# Patient Record
Sex: Female | Born: 1950
Health system: Southern US, Community
[De-identification: ages and names within clinical notes are randomized; demographics above are authoritative.]

## PROBLEM LIST (undated history)

## (undated) DIAGNOSIS — R413 Other amnesia: Secondary | ICD-10-CM

## (undated) DIAGNOSIS — N390 Urinary tract infection, site not specified: Secondary | ICD-10-CM

## (undated) DIAGNOSIS — G20A1 Parkinson's disease without dyskinesia, without mention of fluctuations: Secondary | ICD-10-CM

## (undated) DIAGNOSIS — G20 Parkinson's disease: Secondary | ICD-10-CM

## (undated) DIAGNOSIS — F0280 Dementia in other diseases classified elsewhere without behavioral disturbance: Secondary | ICD-10-CM

## (undated) DIAGNOSIS — E049 Nontoxic goiter, unspecified: Secondary | ICD-10-CM

## (undated) DIAGNOSIS — F0390 Unspecified dementia without behavioral disturbance: Secondary | ICD-10-CM

## (undated) DIAGNOSIS — D649 Anemia, unspecified: Secondary | ICD-10-CM

## (undated) DIAGNOSIS — G309 Alzheimer's disease, unspecified: Secondary | ICD-10-CM

## (undated) HISTORY — DX: Nontoxic goiter, unspecified: E04.9

---

## 2015-01-20 ENCOUNTER — Emergency Department
Admission: EM | Admit: 2015-01-20 | Discharge: 2015-01-20 | Disposition: A | Discharge status: Home / Self Care | Payer: Medicare (Managed Care) | Attending: Emergency Medicine | Admitting: Emergency Medicine

## 2015-01-20 ENCOUNTER — Encounter: Payer: Self-pay | Admitting: Emergency Medicine

## 2015-01-20 DIAGNOSIS — N390 Urinary tract infection, site not specified: Secondary | ICD-10-CM | POA: Diagnosis not present

## 2015-01-20 DIAGNOSIS — M549 Dorsalgia, unspecified: Secondary | ICD-10-CM | POA: Diagnosis present

## 2015-01-20 LAB — URINALYSIS COMPLETE WITH MICROSCOPIC (ARMC ONLY)
BILIRUBIN URINE: NEGATIVE
Bacteria, UA: NONE SEEN
GLUCOSE, UA: NEGATIVE mg/dL
KETONES UR: NEGATIVE mg/dL
Nitrite: NEGATIVE
PH: 6 (ref 5.0–8.0)
Protein, ur: NEGATIVE mg/dL
Specific Gravity, Urine: 1.012 (ref 1.005–1.030)

## 2015-01-20 MED ORDER — CEPHALEXIN 500 MG PO CAPS: 500.0000 mg | ORAL_CAPSULE | Freq: Three times a day (TID) | ORAL | Status: DC

## 2015-01-20 NOTE — ED Notes (Addendum)
Pt c/o lower back pain X 1 month and white colored vaginal discharge.  Pt was living in [place] and came down here to stay with brother. Family wants her evaluated for any medical problems.  Explained ED will evaluate for back pain and discharge but can get her a number when she goes home to get set up with PCP so that she can have full evaluation that they are asking for.

## 2015-01-20 NOTE — Discharge Instructions (Signed)
Please seek medical attention for any high fevers, chest pain, shortness of breath, change in behavior, persistent vomiting, bloody stool or any other new or concerning symptoms. ° ° °Urinary Tract Infection °Urinary tract infections (UTIs) can develop anywhere along your urinary tract. Your urinary tract is your body's drainage system for removing wastes and extra water. Your urinary tract includes two kidneys, two ureters, a bladder, and a urethra. Your kidneys are a pair of bean-shaped organs. Each kidney is about the size of your fist. They are located below your ribs, one on each side of your spine. °CAUSES °Infections are caused by microbes, which are microscopic organisms, including fungi, viruses, and bacteria. These organisms are so small that they can only be seen through a microscope. Bacteria are the microbes that most commonly cause UTIs. °SYMPTOMS  °Symptoms of UTIs may vary by age and gender of the patient and by the location of the infection. Symptoms in young women typically include a frequent and intense urge to urinate and a painful, burning feeling in the bladder or urethra during urination. Older women and men are more likely to be tired, shaky, and weak and have muscle aches and abdominal pain. A fever may mean the infection is in your kidneys. Other symptoms of a kidney infection include pain in your back or sides below the ribs, nausea, and vomiting. °DIAGNOSIS °To diagnose a UTI, your caregiver will ask you about your symptoms. Your caregiver will also ask you to provide a urine sample. The urine sample will be tested for bacteria and white blood cells. White blood cells are made by your body to help fight infection. °TREATMENT  °Typically, UTIs can be treated with medication. Because most UTIs are caused by a bacterial infection, they usually can be treated with the use of antibiotics. The choice of antibiotic and length of treatment depend on your symptoms and the type of bacteria causing  your infection. °HOME CARE INSTRUCTIONS °· If you were prescribed antibiotics, take them exactly as your caregiver instructs you. Finish the medication even if you feel better after you have only taken some of the medication. °· Drink enough water and fluids to keep your urine clear or pale yellow. °· Avoid caffeine, tea, and carbonated beverages. They tend to irritate your bladder. °· Empty your bladder often. Avoid holding urine for long periods of time. °· Empty your bladder before and after sexual intercourse. °· After a bowel movement, women should cleanse from front to back. Use each tissue only once. °SEEK MEDICAL CARE IF:  °· You have back pain. °· You develop a fever. °· Your symptoms do not begin to resolve within 3 days. °SEEK IMMEDIATE MEDICAL CARE IF:  °· You have severe back pain or lower abdominal pain. °· You develop chills. °· You have nausea or vomiting. °· You have continued burning or discomfort with urination. °MAKE SURE YOU:  °· Understand these instructions. °· Will watch your condition. °· Will get help right away if you are not doing well or get worse. °  °This information is not intended to replace advice given to you by your health care provider. Make sure you discuss any questions you have with your health care provider. °  °Document Released: 11/02/2004 Document Revised: 10/14/2014 Document Reviewed: 03/03/2011 °Elsevier Interactive Patient Education ©2016 [employer] ° °

## 2015-01-20 NOTE — ED Provider Notes (Signed)
[facility] Emergency Department Provider Note   ____________________________________________  Time seen: [id number]  I have reviewed the triage vital signs and the nursing notes.   HISTORY  Chief Complaint Back Pain and Vaginal Discharge   History limited by: Not Limited   HPI Jamie Boyd is a 64 y.o. female who presents to the emergency department today accompanied by family because of concerns for dysuria. The patient states for the last week and a half she has had burning with urination. She states this is not every time she peas but most of the times. In addition she has noticed a bad odor to her urine. She has not noticed any discoloration. She denies any abdominal or flank pain. Patient just moved into the area and family did have questions about getting her a referral for primary care. Patient has not had any fevers.   History reviewed. No pertinent past medical history.  There are no active problems to display for this patient.   History reviewed. No pertinent past surgical history.  No current outpatient prescriptions on file.  Allergies Review of patient's allergies indicates not on file.  History reviewed. No pertinent family history.  Social History Social History  Substance Use Topics  . Smoking status: Never Smoker   . Smokeless tobacco: None  . Alcohol Use: No    Review of Systems  Constitutional: Negative for fever. Cardiovascular: Negative for chest pain. Respiratory: Negative for shortness of breath. Gastrointestinal: Negative for abdominal pain, vomiting and diarrhea. Genitourinary: Positive for dysuria. Musculoskeletal: Negative for back pain. Skin: Negative for rash. Neurological: Negative for headaches, focal weakness or numbness.   10-point ROS otherwise negative.  ____________________________________________   PHYSICAL EXAM:  VITAL SIGNS: ED Triage Vitals  Enc Vitals Group     BP 01/20/15 1313 137/74 mmHg      Pulse Rate 01/20/15 1313 73     Resp 01/20/15 1313 16     Temp 01/20/15 1313 98.7 F (37.1 C)     Temp Source 01/20/15 1313 Oral     SpO2 01/20/15 1313 100 %     Weight 01/20/15 1316 131 lb 3.2 oz (59.512 kg)     Height 01/20/15 1311 5\' 3"  (1.6 m)     Head Cir --      Peak Flow --      Pain Score 01/20/15 1312 6   Constitutional: Alert and oriented. Well appearing and in no distress. Eyes: Conjunctivae are normal. PERRL. Normal extraocular movements. ENT   Head: Normocephalic and atraumatic.   Nose: No congestion/rhinnorhea.   Mouth/Throat: Mucous membranes are moist.   Neck: No stridor. Hematological/Lymphatic/Immunilogical: No cervical lymphadenopathy. Cardiovascular: Normal rate, regular rhythm.  No murmurs, rubs, or gallops. Respiratory: Normal respiratory effort without tachypnea nor retractions. Breath sounds are clear and equal bilaterally. No wheezes/rales/rhonchi. Gastrointestinal: Soft and nontender. No distention. There is no CVA tenderness. Genitourinary: Deferred Musculoskeletal: Normal range of motion in all extremities. No joint effusions.  No lower extremity tenderness nor edema. Neurologic:  Normal speech and language. No gross focal neurologic deficits are appreciated.  Skin:  Skin is warm, dry and intact. No rash noted. Psychiatric: Mood and affect are normal. Speech and behavior are normal. Patient exhibits appropriate insight and judgment.  ____________________________________________    LABS (pertinent positives/negatives)  Labs Reviewed  URINALYSIS COMPLETEWITH MICROSCOPIC ([facility] ONLY) - Abnormal; Notable for the following:    Color, Urine YELLOW (*)    APPearance HAZY (*)    Hgb urine dipstick 1+ (*)  Leukocytes, UA 2+ (*)    Squamous Epithelial / LPF 0-5 (*)    All other components within normal limits     ____________________________________________   EKG  None  ____________________________________________     RADIOLOGY  None  ____________________________________________   PROCEDURES  Procedure(s) performed: None  Critical Care performed: No  ____________________________________________   INITIAL IMPRESSION / ASSESSMENT AND PLAN / ED COURSE  Pertinent labs & imaging results that were available during my care of the patient were reviewed by me and considered in my medical decision making (see chart for details).  Patient presented to the emergency department today because of concerns for dysuria. Urine was tested and is concerning for urinary tract infection. Patient has no allergies to antibiotics. This point no signs of pyelonephritis or sepsis. Will give oral antibiotics and referral for primary care.  ____________________________________________   FINAL CLINICAL IMPRESSION(S) / ED DIAGNOSES  Final diagnoses:  UTI (lower urinary tract infection)     Nance Pear, MD 01/20/15 1552

## 2015-01-27 ENCOUNTER — Emergency Department: Payer: Medicare (Managed Care)

## 2015-01-27 ENCOUNTER — Encounter: Payer: Self-pay | Admitting: Emergency Medicine

## 2015-01-27 ENCOUNTER — Other Ambulatory Visit: Payer: Self-pay

## 2015-01-27 ENCOUNTER — Emergency Department
Admission: EM | Admit: 2015-01-27 | Discharge: 2015-01-27 | Disposition: A | Discharge status: Home / Self Care | Payer: Medicare (Managed Care) | Attending: Student | Admitting: Student

## 2015-01-27 DIAGNOSIS — R1031 Right lower quadrant pain: Secondary | ICD-10-CM | POA: Diagnosis present

## 2015-01-27 DIAGNOSIS — Z792 Long term (current) use of antibiotics: Secondary | ICD-10-CM | POA: Diagnosis not present

## 2015-01-27 DIAGNOSIS — N390 Urinary tract infection, site not specified: Secondary | ICD-10-CM

## 2015-01-27 DIAGNOSIS — I10 Essential (primary) hypertension: Secondary | ICD-10-CM | POA: Insufficient documentation

## 2015-01-27 HISTORY — DX: Unspecified dementia, unspecified severity, without behavioral disturbance, psychotic disturbance, mood disturbance, and anxiety: F03.90

## 2015-01-27 LAB — URINALYSIS COMPLETE WITH MICROSCOPIC (ARMC ONLY)
Bilirubin Urine: NEGATIVE
GLUCOSE, UA: NEGATIVE mg/dL
HGB URINE DIPSTICK: NEGATIVE
Ketones, ur: NEGATIVE mg/dL
Nitrite: NEGATIVE
PROTEIN: NEGATIVE mg/dL
SPECIFIC GRAVITY, URINE: 1.006 (ref 1.005–1.030)
pH: 7 (ref 5.0–8.0)

## 2015-01-27 LAB — COMPREHENSIVE METABOLIC PANEL
ALBUMIN: 3.7 g/dL (ref 3.5–5.0)
ALK PHOS: 77 U/L (ref 38–126)
ALT: 18 U/L (ref 14–54)
AST: 27 U/L (ref 15–41)
Anion gap: 8 (ref 5–15)
BUN: 16 mg/dL (ref 6–20)
CALCIUM: 8.9 mg/dL (ref 8.9–10.3)
CHLORIDE: 105 mmol/L (ref 101–111)
CO2: 24 mmol/L (ref 22–32)
CREATININE: 0.71 mg/dL (ref 0.44–1.00)
GFR calc Af Amer: >60 mL/min (ref >60)
GFR calc non Af Amer: >60 mL/min (ref >60)
GLUCOSE: 134 mg/dL — AB (ref 65–99)
Potassium: 4 mmol/L (ref 3.5–5.1)
SODIUM: 137 mmol/L (ref 135–145)
Total Bilirubin: 0.4 mg/dL (ref 0.3–1.2)
Total Protein: 7.4 g/dL (ref 6.5–8.1)

## 2015-01-27 LAB — TROPONIN I

## 2015-01-27 LAB — CBC WITH DIFFERENTIAL/PLATELET
BASOS ABS: 0.1 10*3/uL (ref 0–0.1)
BASOS PCT: 2 %
EOS ABS: 0.8 10*3/uL — AB (ref 0–0.7)
Eosinophils Relative: 9 %
HCT: 30 % — ABNORMAL LOW (ref 35.0–47.0)
HEMOGLOBIN: 9.7 g/dL — AB (ref 12.0–16.0)
LYMPHS ABS: 2.1 10*3/uL (ref 1.0–3.6)
Lymphocytes Relative: 23 %
MCH: 28.2 pg (ref 26.0–34.0)
MCHC: 32.2 g/dL (ref 32.0–36.0)
MCV: 87.5 fL (ref 80.0–100.0)
Monocytes Absolute: 0.8 10*3/uL (ref 0.2–0.9)
Monocytes Relative: 10 %
NEUTROS PCT: 56 %
Neutro Abs: 5 10*3/uL (ref 1.4–6.5)
PLATELETS: 317 10*3/uL (ref 150–440)
RBC: 3.43 MIL/uL — AB (ref 3.80–5.20)
RDW: 15.2 % — ABNORMAL HIGH (ref 11.5–14.5)
WBC: 8.9 10*3/uL (ref 3.6–11.0)

## 2015-01-27 LAB — LIPASE, BLOOD: Lipase: 64 U/L — ABNORMAL HIGH (ref 11–51)

## 2015-01-27 MED ORDER — ONDANSETRON HCL 4 MG/2ML IJ SOLN
4.0000 mg | Freq: Once | INTRAMUSCULAR | Status: AC
  Administered 2015-01-27: 4 mg via INTRAVENOUS
  Filled 2015-01-27: qty 2

## 2015-01-27 MED ORDER — SODIUM CHLORIDE 0.9 % IV BOLUS (SEPSIS)
500.0000 mL | Freq: Once | INTRAVENOUS | Status: AC
  Administered 2015-01-27: 500 mL via INTRAVENOUS

## 2015-01-27 MED ORDER — IOHEXOL 240 MG/ML SOLN
25.0000 mL | INTRAMUSCULAR | Status: AC
  Administered 2015-01-27: 25 mL via ORAL
  Administered 2015-01-27: 50 mL via ORAL

## 2015-01-27 MED ORDER — IOHEXOL 300 MG/ML  SOLN
85.0000 mL | Freq: Once | INTRAMUSCULAR | Status: AC | PRN
  Administered 2015-01-27: 85 mL via INTRAVENOUS

## 2015-01-27 NOTE — ED Notes (Signed)
Pt states RLQ abd pain for a few days, denies any nausea or vomiting or weakness, states BM yesterday, pt awake and alert

## 2015-01-27 NOTE — ED Notes (Signed)
Pt brought to ED by her son who was concerned with her new onset of R lower abdominal pain that started yesterday.  Denies nausea/vomiting/diarrhea.

## 2015-01-27 NOTE — ED Notes (Signed)
Patient transported to CT 

## 2015-01-27 NOTE — ED Provider Notes (Signed)
[facility] Emergency Department Provider Note  ____________________________________________  Time seen: Approximately 7:16 AM  I have reviewed the triage vital signs and the nursing notes.   HISTORY  Chief Complaint Abdominal Pain  Caveat-history of present illness and review of systems is limited due to the patient's dementia. Information is obtained from her son at bedside.  HPI Dezyrae Luebbert is a 64 y.o. female with history of hypertension, mild dementia who presents for evaluation of sudden onset right lower quadrant pain this morning, maximal at onset, now mild, no modifying factors, constant since onset. The son reports that the patient awoke from sleep this morning and was grabbing her right lower abdomen and crying in pain. This seems to have improved and she has appeared much more comfortable but this concerned him. She has had no vomiting, diarrhea, fevers or chills. She denies chest pain or difficulty breathing. She denies painful or burning with urination.   Past Medical History  Diagnosis Date  . Dementia     There are no active problems to display for this patient.   History reviewed. No pertinent past surgical history.  Current Outpatient Rx  Name  Route  Sig  Dispense  Refill  . cephALEXin (KEFLEX) 500 MG capsule   Oral   Take 1 capsule (500 mg total) by mouth 3 (three) times daily.   30 capsule   0     Allergies Review of patient's allergies indicates no known allergies.  History reviewed. No pertinent family history.  Social History Social History  Substance Use Topics  . Smoking status: Never Smoker   . Smokeless tobacco: None  . Alcohol Use: No    Review of Systems Constitutional: No fever/chills Eyes: No visual changes. ENT: No sore throat. Cardiovascular: Denies chest pain. Respiratory: Denies shortness of breath. Gastrointestinal: + abdominal pain.  No nausea, no vomiting.  No diarrhea.  No  constipation. Genitourinary: Negative for dysuria. Musculoskeletal: Negative for back pain. Skin: Negative for rash. Neurological: Negative for headaches, focal weakness or numbness.  10-point ROS otherwise negative.  ____________________________________________   PHYSICAL EXAM:  VITAL SIGNS: ED Triage Vitals  Enc Vitals Group     BP 01/27/15 0708 123/70 mmHg     Pulse Rate 01/27/15 0708 70     Resp --      Temp 01/27/15 0708 98.4 F (36.9 C)     Temp Source 01/27/15 0708 Oral     SpO2 01/27/15 0708 100 %     Weight 01/27/15 0708 110 lb (49.896 kg)     Height 01/27/15 0708 5\' 3"  (1.6 m)     Head Cir --      Peak Flow --      Pain Score 01/27/15 0709 9     Pain Loc --      Pain Edu? --      Excl. in [place]? --     Constitutional: Alert and oriented to self and place but not to year. Well appearing and in no acute distress. Eyes: Conjunctivae are normal. PERRL. EOMI. Head: Atraumatic. Nose: No congestion/rhinnorhea. Mouth/Throat: Mucous membranes are moist.  Oropharynx non-erythematous. Neck: No stridor.   Cardiovascular: Normal rate, regular rhythm. Grossly normal heart sounds.  Good peripheral circulation. Respiratory: Normal respiratory effort.  No retractions. Lungs CTAB. Gastrointestinal: Soft with faint right lower quadrant tenderness. Normal bowel sounds. No distention. No CVA tenderness. Genitourinary: deferred Rectal: brown stool in rectal vault is guaiac negative Musculoskeletal: No lower extremity tenderness nor edema.  No joint  effusions. Neurologic:  Normal speech and language. No gross focal neurologic deficits are appreciated. No gait instability. Skin:  Skin is warm, dry and intact. No rash noted. Psychiatric: Mood and affect are normal. Speech and behavior are normal.  ____________________________________________   LABS (all labs ordered are listed, but only abnormal results are displayed)  Labs Reviewed  CBC WITH DIFFERENTIAL/PLATELET - Abnormal;  Notable for the following:    RBC 3.43 (*)    Hemoglobin 9.7 (*)    HCT 30.0 (*)    RDW 15.2 (*)    Eosinophils Absolute 0.8 (*)    All other components within normal limits  COMPREHENSIVE METABOLIC PANEL - Abnormal; Notable for the following:    Glucose, Bld 134 (*)    All other components within normal limits  LIPASE, BLOOD - Abnormal; Notable for the following:    Lipase 64 (*)    All other components within normal limits  URINALYSIS COMPLETEWITH MICROSCOPIC ([facility] ONLY) - Abnormal; Notable for the following:    Color, Urine STRAW (*)    APPearance CLEAR (*)    Leukocytes, UA 3+ (*)    Bacteria, UA RARE (*)    Squamous Epithelial / LPF 0-5 (*)    All other components within normal limits  URINE CULTURE  TROPONIN I   ____________________________________________  EKG  ED ECG REPORT I, Joanne Gavel, the attending physician, personally viewed and interpreted this ECG.   Date: 01/27/2015  EKG Time: 07:19  Rate: 71  Rhythm: normal sinus rhythm  Axis: normal  Intervals:none  ST&T Change: No acute ST elevation. No acute ST elevation. Minimal ST depression in aVF, V4, V5, V6.  ____________________________________________  RADIOLOGY  CT abdomen and pelvis IMPRESSION: Appendix appears normal. Soft tissue material throughout the proximal at ascending colon. Likely stool. A mass could easily be obscured in this circumstance. This finding may well warrant direct visualization by colonoscopy to further assess. There is no mesenteric thickening in this area or elsewhere in the abdomen or pelvis.  Mild wall thickening in the proximal duodenum, likely a degree of duodenitis. No well-defined ulceration identified in this area. No fistula.  8 x 6 mm calculus lower pole left kidney, nonobstructing. No hydronephrosis or ureteral calculus on either side.  Moderate distention of the rectum by air.  Uterus absent.  Disc extrusion far laterally on the right at L4-5.  Multilevel osteoarthritic change in the lumbar spine.  Small ventral hernia containing only fat.  ____________________________________________   PROCEDURES  Procedure(s) performed: None  Critical Care performed: No  ____________________________________________   INITIAL IMPRESSION / ASSESSMENT AND PLAN / ED COURSE  Pertinent labs & imaging results that were available during my care of the patient were reviewed by me and considered in my medical decision making (see chart for details).  Myasiah Sjoblom is a 64 y.o. female with history of hypertension, mild dementia who presents for evaluation of sudden onset right lower quadrant pain this morning, maximal at onset, now mild, no modifying factors. On exam, she is well-appearing and in no acute distress. Vital signs stable, she is afebrile. We'll obtain labs, urinalysis, likely CT of the abdomen and pelvis to further evaluate the cause of her pain.   ----------------------------------------- 11:31 AM on 01/27/2015 ----------------------------------------- Patient continues to appear well with complete resolution of her pain at this time. She is tolerating by mouth intake. Labs reviewed. Hemoglobin 9.7 but baseline unknown. Normal rectal exam was guaiac-negative stool. CMP unremarkable. Troponin negative. Lipase is elevated at 64. CT of  the abdomen and pelvis shows no acute pancreatitis or any other acute pathology to explain the patient's pain. Urinalysis concerning for urinary tract infection however the patient is currently being treated with Keflex for urinary tract infection. I have advised her to continue Keflex, urine cultures are pending. We discussed return precautions, need for close PCP follow-up with referral to GI for colonoscopy given CT results today. The patient her family at bedside are comfortable with the discharge plan.  ____________________________________________   FINAL CLINICAL IMPRESSION(S) / ED  DIAGNOSES  Final diagnoses:  RLQ abdominal pain  UTI (lower urinary tract infection)      Joanne Gavel, MD 01/27/15 1544

## 2015-01-29 DIAGNOSIS — N390 Urinary tract infection, site not specified: Secondary | ICD-10-CM | POA: Insufficient documentation

## 2015-01-29 DIAGNOSIS — D649 Anemia, unspecified: Secondary | ICD-10-CM | POA: Insufficient documentation

## 2015-01-29 DIAGNOSIS — R413 Other amnesia: Secondary | ICD-10-CM | POA: Insufficient documentation

## 2015-01-29 LAB — URINE CULTURE

## 2015-02-17 ENCOUNTER — Encounter: Payer: Self-pay | Admitting: *Deleted

## 2015-02-18 ENCOUNTER — Ambulatory Visit: Payer: Medicare HMO | Admitting: Anesthesiology

## 2015-02-18 ENCOUNTER — Encounter: Payer: Self-pay | Admitting: *Deleted

## 2015-02-18 ENCOUNTER — Ambulatory Visit
Admission: RE | Admit: 2015-02-18 | Discharge: 2015-02-18 | Disposition: A | Discharge status: Home / Self Care | Payer: Medicare HMO | Source: Ambulatory Visit | Attending: Gastroenterology | Admitting: Gastroenterology

## 2015-02-18 ENCOUNTER — Encounter
Admission: RE | Disposition: A | Discharge status: Home / Self Care | Payer: Self-pay | Source: Ambulatory Visit | Attending: Gastroenterology

## 2015-02-18 DIAGNOSIS — F0390 Unspecified dementia without behavioral disturbance: Secondary | ICD-10-CM | POA: Diagnosis not present

## 2015-02-18 DIAGNOSIS — R1031 Right lower quadrant pain: Secondary | ICD-10-CM | POA: Diagnosis not present

## 2015-02-18 DIAGNOSIS — K5520 Angiodysplasia of colon without hemorrhage: Secondary | ICD-10-CM | POA: Insufficient documentation

## 2015-02-18 DIAGNOSIS — Z79899 Other long term (current) drug therapy: Secondary | ICD-10-CM | POA: Diagnosis not present

## 2015-02-18 DIAGNOSIS — D122 Benign neoplasm of ascending colon: Secondary | ICD-10-CM | POA: Insufficient documentation

## 2015-02-18 DIAGNOSIS — D649 Anemia, unspecified: Secondary | ICD-10-CM | POA: Insufficient documentation

## 2015-02-18 DIAGNOSIS — D125 Benign neoplasm of sigmoid colon: Secondary | ICD-10-CM | POA: Insufficient documentation

## 2015-02-18 HISTORY — DX: Other amnesia: R41.3

## 2015-02-18 HISTORY — PX: ESOPHAGOGASTRODUODENOSCOPY: SHX5428

## 2015-02-18 HISTORY — DX: Anemia, unspecified: D64.9

## 2015-02-18 HISTORY — PX: COLONOSCOPY WITH PROPOFOL: SHX5780

## 2015-02-18 SURGERY — COLONOSCOPY WITH PROPOFOL
Anesthesia: General

## 2015-02-18 MED ORDER — PROPOFOL 500 MG/50ML IV EMUL
INTRAVENOUS | Status: DC | PRN
Start: 2015-02-18 — End: 2015-02-18
  Administered 2015-02-18: 120 ug/kg/min via INTRAVENOUS

## 2015-02-18 MED ORDER — SODIUM CHLORIDE 0.9 % IV SOLN
INTRAVENOUS | Status: DC
  Administered 2015-02-18: 10:00:00 via INTRAVENOUS

## 2015-02-18 MED ORDER — LIDOCAINE HCL (CARDIAC) 20 MG/ML IV SOLN
INTRAVENOUS | Status: DC | PRN
  Administered 2015-02-18: 60 mg via INTRAVENOUS

## 2015-02-18 MED ORDER — PROPOFOL 10 MG/ML IV BOLUS
INTRAVENOUS | Status: DC | PRN
  Administered 2015-02-18: 30 mg via INTRAVENOUS

## 2015-02-18 MED ORDER — PHENYLEPHRINE HCL 10 MG/ML IJ SOLN
INTRAMUSCULAR | Status: DC | PRN
  Administered 2015-02-18: 100 ug via INTRAVENOUS

## 2015-02-18 MED ORDER — GLYCOPYRROLATE 0.2 MG/ML IJ SOLN
INTRAMUSCULAR | Status: DC | PRN
  Administered 2015-02-18: 0.2 mg via INTRAVENOUS

## 2015-02-18 MED ORDER — SODIUM CHLORIDE 0.9 % IV SOLN: INTRAVENOUS | Status: DC

## 2015-02-18 NOTE — Op Note (Signed)
[facility] Patient Name: Jamie Boyd Procedure Date: 02/18/2015 10:31 AM MRN: [id number] Account #: [id number] Date of Birth: 06-Sep-1950 Admit Type: Outpatient Age: 65 Room: [facility] ENDO ROOM 4 Gender: Female Note Status: Finalized Procedure:         Colonoscopy Indications:       Anemia, Abnormal CT of the GI tract Providers:         Lupita Dawn. Candace Cruise, MD Referring MD:      Forest Gleason Md, MD (Referring MD) Medicines:         Monitored Anesthesia Care Complications:     No immediate complications. Procedure:         Pre-Anesthesia Assessment:                    - Prior to the procedure, a History and Physical was                     performed, and patient medications, allergies and                     sensitivities were reviewed. The patient's tolerance of                     previous anesthesia was reviewed.                    - The risks and benefits of the procedure and the sedation                     options and risks were discussed with the patient. All                     questions were answered and informed consent was obtained.                    - After reviewing the risks and benefits, the patient was                     deemed in satisfactory condition to undergo the procedure.                    After obtaining informed consent, the colonoscope was                     passed under direct vision. Throughout the procedure, the                     patient's blood pressure, pulse, and oxygen saturations                     were monitored continuously. The Colonoscope was                     introduced through the anus and advanced to the the cecum,                     identified by appendiceal orifice and ileocecal valve. The                     colonoscopy was performed without difficulty. The patient                     tolerated the procedure well. The quality of the bowel  preparation was good. Findings:      A diminutive  polyp was found in the ascending colon. The polyp was       sessile. The polyp was removed with a jumbo cold forceps. Resection and       retrieval were complete.      A small polyp was found in the sigmoid colon. The polyp was sessile. The       polyp was removed with a cold snare. Resection and retrieval were       complete.      A single small angiodysplastic lesion without bleeding was found in the       cecum. Coagulation for bleeding prevention using heater probe was       successful.      A single medium-sized angiodysplastic lesion was found in the cecum.       Coagulation for bleeding prevention using heater probe was unsuccessful.       Active bleding occurred after ill heater probe cauterization.       Coagulation for hemostasis using argon plasma at 0.5 liters/minute and       30 watts was stounsuccessful. To prevent more bleeding post-maneuver,       three hemostatic clips were successfully placed. There was no bleeding       at the end of the procedure.      The exam was otherwise without abnormality. Impression:        - One diminutive polyp in the ascending colon. Resected                     and retrieved.                    - One small polyp in the sigmoid colon. Resected and                     retrieved.                    - A single non-bleeding colonic angiodysplastic lesion.                     Treated with a heater probe.                    - A single colonic angiodysplastic lesion. Treatment not                     successful. Treated with a heater probe. Treated with                     argon plasma coagulation (APC). Clips were placed.                    - The examination was otherwise normal. Recommendation:    - Discharge patient to home.                    - Await pathology results.                    - Repeat colonoscopy in 5 years for surveillance based on                     pathology results.                    - The findings and recommendations were  discussed with the                     patient. Procedure Code(s): --- Professional ---                    [phone], 22, Colonoscopy, flexible; with control of                     bleeding, any method                    45385, Colonoscopy, flexible; with removal of tumor(s),                     polyp(s), or other lesion(s) by snare technique                    45380, 67, Colonoscopy, flexible; with biopsy, single or                     multiple Diagnosis Code(s): --- Professional ---                    D12.2, Benign neoplasm of ascending colon                    D12.5, Benign neoplasm of sigmoid colon                    K55.20, Angiodysplasia of colon without hemorrhage                    D64.9, Anemia, unspecified                    R93.3, Abnormal findings on diagnostic imaging of other                     parts of digestive tract CPT copyright 2014 [employer]. All rights reserved. The codes documented in this report are preliminary and upon coder review may  be revised to meet current compliance requirements. Hulen Luster, MD 02/18/2015 11:17:49 AM This report has been signed electronically. Number of Addenda: 0 Note Initiated On: 02/18/2015 10:31 AM Scope Withdrawal Time: 0 hours 19 minutes 18 seconds  Total Procedure Duration: 0 hours 23 minutes 13 seconds       [facility]

## 2015-02-18 NOTE — Op Note (Signed)
[facility] Gastroenterology Patient Name: Jamie Boyd Procedure Date: 02/18/2015 10:33 AM MRN: [id number] Account #: [id number] Date of Birth: April 04, 1950 Admit Type: Outpatient Age: 65 Room: [facility] ENDO ROOM 4 Gender: Female Note Status: Finalized Procedure:         Upper GI endoscopy Indications:       Anemia, Abnormal CT of the GI tract Providers:         Lupita Dawn. Candace Cruise, MD Referring MD:      Forest Gleason Md, MD (Referring MD) Medicines:         Monitored Anesthesia Care Complications:     No immediate complications. Procedure:         Pre-Anesthesia Assessment:                    - Prior to the procedure, a History and Physical was                     performed, and patient medications, allergies and                     sensitivities were reviewed. The patient's tolerance of                     previous anesthesia was reviewed.                    - The risks and benefits of the procedure and the sedation                     options and risks were discussed with the patient. All                     questions were answered and informed consent was obtained.                    - After reviewing the risks and benefits, the patient was                     deemed in satisfactory condition to undergo the procedure.                    After obtaining informed consent, the endoscope was passed                     under direct vision. Throughout the procedure, the                     patient's blood pressure, pulse, and oxygen saturations                     were monitored continuously. The Endoscope was introduced                     through the mouth, and advanced to the second part of                     duodenum. The upper GI endoscopy was accomplished without                     difficulty. The patient tolerated the procedure well. Findings:      The examined esophagus was normal.      The entire examined stomach was normal.      The examined duodenum was  normal. Impression:        -  Normal esophagus.                    - Normal stomach.                    - Normal examined duodenum.                    - No specimens collected. Recommendation:    - Discharge patient to home.                    - Observe patient's clinical course.                    - The findings and recommendations were discussed with the                     patient.                    - Continue present medications. Procedure Code(s): --- Professional ---                    [phone], Esophagogastroduodenoscopy, flexible, transoral;                     diagnostic, including collection of specimen(s) by                     brushing or washing, when performed (separate procedure) Diagnosis Code(s): --- Professional ---                    D64.9, Anemia, unspecified                    R93.3, Abnormal findings on diagnostic imaging of other                     parts of digestive tract CPT copyright 2014 American Medical Association. All rights reserved. The codes documented in this report are preliminary and upon coder review may  be revised to meet current compliance requirements. Hulen Luster, MD 02/18/2015 10:47:16 AM This report has been signed electronically. Number of Addenda: 0 Note Initiated On: 02/18/2015 10:33 AM      [facility]

## 2015-02-18 NOTE — Anesthesia Preprocedure Evaluation (Signed)
Anesthesia Evaluation  Patient identified by MRN, date of birth, ID band Patient awake    Reviewed: Allergy & Precautions, H&P , NPO status , Patient's Chart, lab work & pertinent test results, reviewed documented beta blocker date and time   Airway Mallampati: II  TM Distance: >3 FB Neck ROM: full    Dental no notable dental hx. (+) Teeth Intact   Pulmonary neg pulmonary ROS,    Pulmonary exam normal breath sounds clear to auscultation       Cardiovascular Exercise Tolerance: Good negative cardio ROS   Rhythm:regular Rate:Normal     Neuro/Psych PSYCHIATRIC DISORDERS dementia negative neurological ROS  negative psych ROS   GI/Hepatic negative GI ROS, Neg liver ROS,   Endo/Other  negative endocrine ROS  Renal/GU negative Renal ROS  negative genitourinary   Musculoskeletal   Abdominal   Peds  Hematology negative hematology ROS (+) anemia ,   Anesthesia Other Findings   Reproductive/Obstetrics negative OB ROS                             Anesthesia Physical Anesthesia Plan  ASA: III  Anesthesia Plan: General   Post-op Pain Management:    Induction:   Airway Management Planned:   Additional Equipment:   Intra-op Plan:   Post-operative Plan:   Informed Consent: I have reviewed the patients History and Physical, chart, labs and discussed the procedure including the risks, benefits and alternatives for the proposed anesthesia with the patient or authorized representative who has indicated his/her understanding and acceptance.   Dental Advisory Given  Plan Discussed with: CRNA  Anesthesia Plan Comments:         Anesthesia Quick Evaluation

## 2015-02-18 NOTE — H&P (Signed)
  Date of Initial H&P: 02/09/2015  History reviewed, patient examined, no change in status, stable for surgery.

## 2015-02-18 NOTE — Transfer of Care (Signed)
Immediate Anesthesia Transfer of Care Note  Patient: Jamie Boyd  Procedure(s) Performed: Procedure(s): COLONOSCOPY WITH PROPOFOL (N/A) ESOPHAGOGASTRODUODENOSCOPY (EGD) (N/A)  Patient Location: Endoscopy Unit  Anesthesia Type:General  Level of Consciousness: awake, alert , oriented and patient cooperative  Airway & Oxygen Therapy: Patient Spontanous Breathing and Patient connected to nasal cannula oxygen  Post-op Assessment: Report given to RN, Post -op Vital signs reviewed and stable and Patient moving all extremities X 4  Post vital signs: Reviewed and stable  Last Vitals:  Filed Vitals:   02/18/15 0935  BP: 129/110  Pulse: 69  Temp: 37.1 C  Resp: 18    Complications: No apparent anesthesia complications

## 2015-02-19 LAB — SURGICAL PATHOLOGY

## 2015-02-21 NOTE — Anesthesia Postprocedure Evaluation (Signed)
Anesthesia Post Note  Patient: Event organiser  Procedure(s) Performed: Procedure(s) (LRB): COLONOSCOPY WITH PROPOFOL (N/A) ESOPHAGOGASTRODUODENOSCOPY (EGD) (N/A)  Patient location during evaluation: PACU Anesthesia Type: General Level of consciousness: awake and alert Pain management: pain level controlled Vital Signs Assessment: post-procedure vital signs reviewed and stable Respiratory status: spontaneous breathing, nonlabored ventilation, respiratory function stable and patient connected to nasal cannula oxygen Cardiovascular status: blood pressure returned to baseline and stable Postop Assessment: no signs of nausea or vomiting Anesthetic complications: no    Last Vitals:  Filed Vitals:   02/18/15 1141 02/18/15 1151  BP: 116/79 121/66  Pulse: 77 71  Temp:    Resp: 13 14    Last Pain:  Filed Vitals:   02/19/15 0753  PainSc: 0-No pain                 Molli Barrows

## 2015-02-24 ENCOUNTER — Other Ambulatory Visit: Payer: Self-pay | Admitting: Pediatrics

## 2015-02-24 ENCOUNTER — Emergency Department: Payer: Medicare HMO

## 2015-02-24 ENCOUNTER — Emergency Department
Admission: EM | Admit: 2015-02-24 | Discharge: 2015-02-24 | Disposition: A | Discharge status: Home / Self Care | Payer: Medicare HMO | Attending: Emergency Medicine | Admitting: Emergency Medicine

## 2015-02-24 ENCOUNTER — Encounter: Payer: Self-pay | Admitting: Emergency Medicine

## 2015-02-24 DIAGNOSIS — R079 Chest pain, unspecified: Secondary | ICD-10-CM | POA: Diagnosis present

## 2015-02-24 DIAGNOSIS — Z79899 Other long term (current) drug therapy: Secondary | ICD-10-CM | POA: Insufficient documentation

## 2015-02-24 DIAGNOSIS — R0789 Other chest pain: Secondary | ICD-10-CM | POA: Diagnosis not present

## 2015-02-24 DIAGNOSIS — M810 Age-related osteoporosis without current pathological fracture: Secondary | ICD-10-CM

## 2015-02-24 DIAGNOSIS — Z87891 Personal history of nicotine dependence: Secondary | ICD-10-CM | POA: Diagnosis not present

## 2015-02-24 LAB — COMPREHENSIVE METABOLIC PANEL
ALBUMIN: 3.7 g/dL (ref 3.5–5.0)
ALK PHOS: 94 U/L (ref 38–126)
ALT: 16 U/L (ref 14–54)
AST: 25 U/L (ref 15–41)
Anion gap: 4 — ABNORMAL LOW (ref 5–15)
BUN: 12 mg/dL (ref 6–20)
CALCIUM: 9 mg/dL (ref 8.9–10.3)
CHLORIDE: 108 mmol/L (ref 101–111)
CO2: 28 mmol/L (ref 22–32)
CREATININE: 0.71 mg/dL (ref 0.44–1.00)
GFR calc non Af Amer: >60 mL/min (ref >60)
GLUCOSE: 99 mg/dL (ref 65–99)
Potassium: 3.7 mmol/L (ref 3.5–5.1)
SODIUM: 140 mmol/L (ref 135–145)
Total Bilirubin: 0.4 mg/dL (ref 0.3–1.2)
Total Protein: 7.6 g/dL (ref 6.5–8.1)

## 2015-02-24 LAB — CBC WITH DIFFERENTIAL/PLATELET
BASOS ABS: 0.1 10*3/uL (ref 0–0.1)
BASOS PCT: 1 %
EOS ABS: 0.8 10*3/uL — AB (ref 0–0.7)
EOS PCT: 9 %
HCT: 29.6 % — ABNORMAL LOW (ref 35.0–47.0)
HEMOGLOBIN: 9.6 g/dL — AB (ref 12.0–16.0)
Lymphocytes Relative: 12 %
Lymphs Abs: 1.2 10*3/uL (ref 1.0–3.6)
MCH: 27.5 pg (ref 26.0–34.0)
MCHC: 32.4 g/dL (ref 32.0–36.0)
MCV: 84.9 fL (ref 80.0–100.0)
Monocytes Absolute: 1 10*3/uL — ABNORMAL HIGH (ref 0.2–0.9)
Monocytes Relative: 10 %
NEUTROS PCT: 68 %
Neutro Abs: 6.7 10*3/uL — ABNORMAL HIGH (ref 1.4–6.5)
PLATELETS: 344 10*3/uL (ref 150–440)
RBC: 3.48 MIL/uL — AB (ref 3.80–5.20)
RDW: 16.2 % — ABNORMAL HIGH (ref 11.5–14.5)
WBC: 9.8 10*3/uL (ref 3.6–11.0)

## 2015-02-24 LAB — TROPONIN I: Troponin I: <0.03 ng/mL (ref <0.031)

## 2015-02-24 MED ORDER — FAMOTIDINE 20 MG PO TABS: 20.0000 mg | ORAL_TABLET | Freq: Two times a day (BID) | ORAL | Status: DC | PRN

## 2015-02-24 MED ORDER — ESOMEPRAZOLE MAGNESIUM 40 MG PO CPDR: 40.0000 mg | DELAYED_RELEASE_CAPSULE | Freq: Every day | ORAL | Status: DC

## 2015-02-24 MED ORDER — GI COCKTAIL ~~LOC~~
30.0000 mL | Freq: Once | ORAL | Status: AC
  Administered 2015-02-24: 30 mL via ORAL
  Filled 2015-02-24: qty 30

## 2015-02-24 MED ORDER — FAMOTIDINE 20 MG PO TABS
20.0000 mg | ORAL_TABLET | Freq: Once | ORAL | Status: AC
  Administered 2015-02-24: 20 mg via ORAL
  Filled 2015-02-24: qty 1

## 2015-02-24 NOTE — ED Notes (Signed)
Discussed discharge instructions, prescriptions, and follow-up care with patient and POA, with patient's permission. No questions or concerns at this time. Pt stable at discharge.

## 2015-02-24 NOTE — ED Notes (Signed)
Woke up this am with a burning sensation in chest. Denies sob or nausea. Skin w/d with good color.

## 2015-02-24 NOTE — ED Provider Notes (Signed)
CSN: [id number]     Arrival date & time 02/24/15  0757 History   First MD Initiated Contact with Patient 02/24/15 0815     Chief Complaint  Patient presents with  . Chest Pain     (Consider location/radiation/quality/duration/timing/severity/associated sxs/prior Treatment) The history is provided by the patient.  Jamie Boyd is a 65 y.o. female history mild dementia, anemia here presenting for chest pain. Patient states that she woke up this morning with some burning sensation in her chest. It woke her up around 4 AM and then around 7 AM. Denies any shortness of breath or radiation from the pain. Denies any abdominal pain or vomiting. Denies any history of reflux or any recent fatty or fried foods. Denies any history of CAD or stents. Patient doesn't smoke.  Level V caveat- dementia   Past Medical History  Diagnosis Date  . Dementia   . Anemia   . Memory deficit    Past Surgical History  Procedure Laterality Date  . Abdominal hysterectomy    . Colonoscopy with propofol N/A 02/18/2015    Procedure: COLONOSCOPY WITH PROPOFOL;  Surgeon: Hulen Luster, MD;  Location: [facility] ENDOSCOPY;  Service: Gastroenterology;  Laterality: N/A;  . Esophagogastroduodenoscopy N/A 02/18/2015    Procedure: ESOPHAGOGASTRODUODENOSCOPY (EGD);  Surgeon: Hulen Luster, MD;  Location: [facility] ENDOSCOPY;  Service: Gastroenterology;  Laterality: N/A;   History reviewed. No pertinent family history. Social History  Substance Use Topics  . Smoking status: Former Research scientist (life sciences)  . Smokeless tobacco: Never Used  . Alcohol Use: No   OB History    Gravida Para Term Preterm AB TAB SAB Ectopic Multiple Living   0 0 0 0 0 0 0 0       Review of Systems  Cardiovascular: Positive for chest pain.  All other systems reviewed and are negative.     Allergies  Review of patient's allergies indicates no known allergies.  Home Medications   Prior to Admission medications   Medication Sig Start Date End Date Taking? Authorizing  Provider  docusate sodium (COLACE) 100 MG capsule Take 1 capsule by mouth 2 (two) times daily as needed. 02/23/15 02/23/16 Yes Historical Provider, MD  ferrous sulfate (TH IRON) 325 (65 FE) MG tablet Take 1 tablet by mouth daily. 02/23/15 02/23/16 Yes Historical Provider, MD  Multiple Vitamins-Minerals (CENTRUM SILVER ULTRA WOMENS PO) Take 1 tablet by mouth daily.    Yes Historical Provider, MD  cephALEXin (KEFLEX) 500 MG capsule Take 1 capsule (500 mg total) by mouth 3 (three) times daily. Patient not taking: Reported on 02/24/2015 01/20/15   Nance Pear, MD   BP 135/78 mmHg  Pulse 68  Resp 16  SpO2 100% Physical Exam  Constitutional: She is oriented to person, place, and time. She appears well-developed and well-nourished.  HENT:  Head: Normocephalic.  Mouth/Throat: Oropharynx is clear and moist.  Eyes: Conjunctivae are normal. Pupils are equal, round, and reactive to light.  Neck: Normal range of motion. Neck supple.  Cardiovascular: Normal rate, regular rhythm and normal heart sounds.   Pulmonary/Chest: Effort normal and breath sounds normal. No respiratory distress. She has no wheezes. She has no rales.  Abdominal: Soft. Bowel sounds are normal. She exhibits no distension. There is no tenderness. There is no rebound.  Musculoskeletal: Normal range of motion. She exhibits no edema or tenderness.  Neurological: She is alert and oriented to person, place, and time. No cranial nerve deficit.  Skin: Skin is warm and dry.  Psychiatric: She has a normal  mood and affect. Her behavior is normal. Judgment and thought content normal.  Nursing note and vitals reviewed.   ED Course  Procedures (including critical care time) Labs Review Labs Reviewed  CBC WITH DIFFERENTIAL/PLATELET - Abnormal; Notable for the following:    RBC 3.48 (*)    Hemoglobin 9.6 (*)    HCT 29.6 (*)    RDW 16.2 (*)    Neutro Abs 6.7 (*)    Monocytes Absolute 1.0 (*)    Eosinophils Absolute 0.8 (*)    All other  components within normal limits  COMPREHENSIVE METABOLIC PANEL - Abnormal; Notable for the following:    Anion gap 4 (*)    All other components within normal limits  TROPONIN I  TROPONIN I    Imaging Review Dg Chest 2 View  02/24/2015  CLINICAL DATA:  Acute chest pain. EXAM: CHEST  2 VIEW COMPARISON:  None. FINDINGS: The heart size and mediastinal contours are within normal limits. Both lungs are clear. No pneumothorax or pleural effusion is noted. The visualized skeletal structures are unremarkable. IMPRESSION: No active cardiopulmonary disease. Electronically Signed   By: Marijo Conception, M.D.   On: 02/24/2015 08:37   I have personally reviewed and evaluated these images and lab results as part of my medical decision-making.   EKG Interpretation None        ED ECG REPORT I, YAO, DAVID, the attending physician, personally viewed and interpreted this ECG.   Date: 02/24/2015  EKG Time: 08:03  Rate: 83  Rhythm: normal EKG, normal sinus rhythm  Axis: normal  Intervals:none  ST&T Change: nonspecific    MDM   Final diagnoses:  None   Jamie Boyd is a 65 y.o. female here with burning sensation in chest that resolved. Atypical for ACS but given age and recent onset, will get delta trop. Likely reflux. Will check labs, CXR and give GI cocktail.   11:56 AM Patient pain free. Delta trop neg. Likely reflux. Recommend nexium daily, prn pepcid.   Wandra Arthurs, MD 02/24/15 [id number]

## 2015-02-24 NOTE — Discharge Instructions (Signed)
Take nexium daily.   Take pepcid as needed for upset stomach.   See your doctor. You may need to see GI doctor if you have persistent symptoms.   Return to ER if you have severe chest pain, abdominal pain, vomiting.

## 2015-03-01 DIAGNOSIS — F03B0 Unspecified dementia, moderate, without behavioral disturbance, psychotic disturbance, mood disturbance, and anxiety: Secondary | ICD-10-CM | POA: Insufficient documentation

## 2015-03-01 DIAGNOSIS — F0390 Unspecified dementia without behavioral disturbance: Secondary | ICD-10-CM | POA: Insufficient documentation

## 2015-04-06 ENCOUNTER — Emergency Department: Payer: Medicare HMO

## 2015-04-06 ENCOUNTER — Emergency Department
Admission: EM | Admit: 2015-04-06 | Discharge: 2015-04-07 | Disposition: A | Discharge status: Home / Self Care | Payer: Medicare HMO | Attending: Emergency Medicine | Admitting: Emergency Medicine

## 2015-04-06 DIAGNOSIS — R1110 Vomiting, unspecified: Secondary | ICD-10-CM | POA: Insufficient documentation

## 2015-04-06 DIAGNOSIS — R42 Dizziness and giddiness: Secondary | ICD-10-CM | POA: Insufficient documentation

## 2015-04-06 DIAGNOSIS — Z79899 Other long term (current) drug therapy: Secondary | ICD-10-CM | POA: Insufficient documentation

## 2015-04-06 DIAGNOSIS — R55 Syncope and collapse: Secondary | ICD-10-CM | POA: Insufficient documentation

## 2015-04-06 DIAGNOSIS — Z87891 Personal history of nicotine dependence: Secondary | ICD-10-CM | POA: Insufficient documentation

## 2015-04-06 DIAGNOSIS — F0390 Unspecified dementia without behavioral disturbance: Secondary | ICD-10-CM | POA: Insufficient documentation

## 2015-04-06 LAB — CBC
HEMATOCRIT: 32.9 % — AB (ref 35.0–47.0)
HEMOGLOBIN: 10.8 g/dL — AB (ref 12.0–16.0)
MCH: 28 pg (ref 26.0–34.0)
MCHC: 32.8 g/dL (ref 32.0–36.0)
MCV: 85.4 fL (ref 80.0–100.0)
PLATELETS: 295 10*3/uL (ref 150–440)
RBC: 3.86 MIL/uL (ref 3.80–5.20)
RDW: 18.1 % — ABNORMAL HIGH (ref 11.5–14.5)
WBC: 11.3 10*3/uL — AB (ref 3.6–11.0)

## 2015-04-06 NOTE — ED Notes (Signed)
Pt c/o vomiting 2 times today and denies any other symptoms

## 2015-04-06 NOTE — ED Notes (Signed)
Pt reports vomited 2 times today with no other symptoms

## 2015-04-07 DIAGNOSIS — R55 Syncope and collapse: Secondary | ICD-10-CM | POA: Diagnosis not present

## 2015-04-07 LAB — COMPREHENSIVE METABOLIC PANEL
ALBUMIN: 3.6 g/dL (ref 3.5–5.0)
ALK PHOS: 88 U/L (ref 38–126)
ALT: 18 U/L (ref 14–54)
AST: 28 U/L (ref 15–41)
Anion gap: 8 (ref 5–15)
BUN: 17 mg/dL (ref 6–20)
CALCIUM: 9.5 mg/dL (ref 8.9–10.3)
CHLORIDE: 104 mmol/L (ref 101–111)
CO2: 26 mmol/L (ref 22–32)
CREATININE: 0.84 mg/dL (ref 0.44–1.00)
GFR calc Af Amer: >60 mL/min (ref >60)
GFR calc non Af Amer: >60 mL/min (ref >60)
GLUCOSE: 95 mg/dL (ref 65–99)
Potassium: 3.4 mmol/L — ABNORMAL LOW (ref 3.5–5.1)
SODIUM: 138 mmol/L (ref 135–145)
Total Bilirubin: 0.2 mg/dL — ABNORMAL LOW (ref 0.3–1.2)
Total Protein: 7 g/dL (ref 6.5–8.1)

## 2015-04-07 LAB — TROPONIN I
Troponin I: <0.03 ng/mL (ref <0.031)
Troponin I: 0.07 ng/mL — ABNORMAL HIGH (ref <0.031)

## 2015-04-07 NOTE — Discharge Instructions (Signed)
Dizziness Dizziness is a common problem. It is a feeling of unsteadiness or light-headedness. You may feel like you are about to faint. Dizziness can lead to injury if you stumble or fall. Anyone can become dizzy, but dizziness is more common in older adults. This condition can be caused by a number of things, including medicines, dehydration, or illness. HOME CARE INSTRUCTIONS Taking these steps may help with your condition: Eating and Drinking  Drink enough fluid to keep your urine clear or pale yellow. This helps to keep you from becoming dehydrated. Try to drink more clear fluids, such as water.  Do not drink alcohol.  Limit your caffeine intake if directed by your health care provider.  Limit your salt intake if directed by your health care provider. Activity  Avoid making quick movements.  Rise slowly from chairs and steady yourself until you feel okay.  In the morning, first sit up on the side of the bed. When you feel okay, stand slowly while you hold onto something until you know that your balance is fine.  Move your legs often if you need to stand in one place for a long time. Tighten and relax your muscles in your legs while you are standing.  Do not drive or operate heavy machinery if you feel dizzy.  Avoid bending down if you feel dizzy. Place items in your home so that they are easy for you to reach without leaning over. Lifestyle  Do not use any tobacco products, including cigarettes, chewing tobacco, or electronic cigarettes. If you need help quitting, ask your health care provider.  Try to reduce your stress level, such as with yoga or meditation. Talk with your health care provider if you need help. General Instructions  Watch your dizziness for any changes.  Take medicines only as directed by your health care provider. Talk with your health care provider if you think that your dizziness is caused by a medicine that you are taking.  Tell a friend or a family  member that you are feeling dizzy. If he or she notices any changes in your behavior, have this person call your health care provider.  Keep all follow-up visits as directed by your health care provider. This is important. SEEK MEDICAL CARE IF:  Your dizziness does not go away.  Your dizziness or light-headedness gets worse.  You feel nauseous.  You have reduced hearing.  You have new symptoms.  You are unsteady on your feet or you feel like the room is spinning. SEEK IMMEDIATE MEDICAL CARE IF:  You vomit or have diarrhea and are unable to eat or drink anything.  You have problems talking, walking, swallowing, or using your arms, hands, or legs.  You feel generally weak.  You are not thinking clearly or you have trouble forming sentences. It may take a friend or family member to notice this.  You have chest pain, abdominal pain, shortness of breath, or sweating.  Your vision changes.  You notice any bleeding.  You have a headache.  You have neck pain or a stiff neck.  You have a fever.   This information is not intended to replace advice given to you by your health care provider. Make sure you discuss any questions you have with your health care provider.   Document Released: 07/19/2000 Document Revised: 06/09/2014 Document Reviewed: 01/19/2014 Elsevier Interactive Patient Education 2016 [employer].  Syncope Syncope is a medical term for fainting or passing out. This means you lose consciousness and drop  to the ground. People are generally unconscious for less than 5 minutes. You may have some muscle twitches for up to 15 seconds before waking up and returning to normal. Syncope occurs more often in older adults, but it can happen to anyone. While most causes of syncope are not dangerous, syncope can be a sign of a serious medical problem. It is important to seek medical care.  CAUSES  Syncope is caused by a sudden drop in blood flow to the brain. The specific cause  is often not determined. Factors that can bring on syncope include:  Taking medicines that lower blood pressure.  Sudden changes in posture, such as standing up quickly.  Taking more medicine than prescribed.  Standing in one place for too long.  Seizure disorders.  Dehydration and excessive exposure to heat.  Low blood sugar (hypoglycemia).  Straining to have a bowel movement.  Heart disease, irregular heartbeat, or other circulatory problems.  Fear, emotional distress, seeing blood, or severe pain. SYMPTOMS  Right before fainting, you may:  Feel dizzy or light-headed.  Feel nauseous.  See all white or all black in your field of vision.  Have cold, clammy skin. DIAGNOSIS  Your health care provider will ask about your symptoms, perform a physical exam, and perform an electrocardiogram (ECG) to record the electrical activity of your heart. Your health care provider may also perform other heart or blood tests to determine the cause of your syncope which may include:  Transthoracic echocardiogram (TTE). During echocardiography, sound waves are used to evaluate how blood flows through your heart.  Transesophageal echocardiogram (TEE).  Cardiac monitoring. This allows your health care provider to monitor your heart rate and rhythm in real time.  Holter monitor. This is a portable device that records your heartbeat and can help diagnose heart arrhythmias. It allows your health care provider to track your heart activity for several days, if needed.  Stress tests by exercise or by giving medicine that makes the heart beat faster. TREATMENT  In most cases, no treatment is needed. Depending on the cause of your syncope, your health care provider may recommend changing or stopping some of your medicines. HOME CARE INSTRUCTIONS  Have someone stay with you until you feel stable.  Do not drive, use machinery, or play sports until your health care provider says it is okay.  Keep  all follow-up appointments as directed by your health care provider.  Lie down right away if you start feeling like you might faint. Breathe deeply and steadily. Wait until all the symptoms have passed.  Drink enough fluids to keep your urine clear or pale yellow.  If you are taking blood pressure or heart medicine, get up slowly and take several minutes to sit and then stand. This can reduce dizziness. SEEK IMMEDIATE MEDICAL CARE IF:   You have a severe headache.  You have unusual pain in the chest, abdomen, or back.  You are bleeding from your mouth or rectum, or you have black or tarry stool.  You have an irregular or very fast heartbeat.  You have pain with breathing.  You have repeated fainting or seizure-like jerking during an episode.  You faint when sitting or lying down.  You have confusion.  You have trouble walking.  You have severe weakness.  You have vision problems. If you fainted, call your local emergency services (911 in [place] Do not drive yourself to the hospital.    This information is not intended to replace advice given to  you by your health care provider. Make sure you discuss any questions you have with your health care provider.   Document Released: 01/23/2005 Document Revised: 06/09/2014 Document Reviewed: 03/24/2011 Elsevier Interactive Patient Education [employer].

## 2015-04-07 NOTE — ED Provider Notes (Signed)
[facility] Emergency Department Provider Note  ____________________________________________  Time seen: Approximately 2318 AM  I have reviewed the triage vital signs and the nursing notes.   HISTORY  Chief Complaint Emesis    HPI Jamie Boyd is a 65 y.o. female who comes into the hospital today with a syncopal episode. The patient's family reports that she went into the kitchen earlier and said that she was having some heartburn. They report that she was walking away and said she felt dizzy. The family grabbed her and reports that she passed out for about a minute. There reports that when she came to she vomited 2 times. The patient's emesis looked like water but she denied any abdominal pain. She did have some strange sensation in her chest but she cannot describe if it was burning. She reports that she felt okay today. The family reports that this is the second episode of syncope in the last few months. The patient moved from [place] 3 months ago but had another syncopal event when she lived in [place]. The patient has a history of dementia so has a difficult time remembering the symptoms she had during the event. She reports that currently she feels well. Per the family when EMS arrived her blood pressure was 0000000 systolic.   Past Medical History  Diagnosis Date  . Dementia   . Anemia   . Memory deficit     There are no active problems to display for this patient.   Past Surgical History  Procedure Laterality Date  . Abdominal hysterectomy    . Colonoscopy with propofol N/A 02/18/2015    Procedure: COLONOSCOPY WITH PROPOFOL;  Surgeon: Hulen Luster, MD;  Location: [facility] ENDOSCOPY;  Service: Gastroenterology;  Laterality: N/A;  . Esophagogastroduodenoscopy N/A 02/18/2015    Procedure: ESOPHAGOGASTRODUODENOSCOPY (EGD);  Surgeon: Hulen Luster, MD;  Location: [facility] ENDOSCOPY;  Service: Gastroenterology;  Laterality: N/A;    Current Outpatient Rx  Name   Route  Sig  Dispense  Refill  . docusate sodium (COLACE) 100 MG capsule   Oral   Take 1 capsule by mouth 2 (two) times daily as needed.         . donepezil (ARICEPT) 5 MG tablet   Oral   Take 5 mg by mouth at bedtime.         Marland Kitchen esomeprazole (NEXIUM) 40 MG capsule   Oral   Take 1 capsule (40 mg total) by mouth daily.   30 capsule   1   . Multiple Vitamins-Minerals (CENTRUM SILVER ULTRA WOMENS PO)   Oral   Take 1 tablet by mouth daily.          . traZODone (DESYREL) 50 MG tablet   Oral   Take 50 mg by mouth at bedtime.         . cephALEXin (KEFLEX) 500 MG capsule   Oral   Take 1 capsule (500 mg total) by mouth 3 (three) times daily. Patient not taking: Reported on 02/24/2015   30 capsule   0   . famotidine (PEPCID) 20 MG tablet   Oral   Take 1 tablet (20 mg total) by mouth 2 (two) times daily as needed for heartburn or indigestion. Patient not taking: Reported on 04/07/2015   10 tablet   0     Allergies Review of patient's allergies indicates no known allergies.  History reviewed. No pertinent family history.  Social History Social History  Substance Use Topics  . Smoking status: Former Research scientist (life sciences)  .  Smokeless tobacco: Never Used  . Alcohol Use: No    Review of Systems Constitutional: No fever/chills Eyes: No visual changes. ENT: No sore throat. Cardiovascular: Denies chest pain. Respiratory: Denies shortness of breath. Gastrointestinal: Vomiting Genitourinary: Negative for dysuria. Musculoskeletal: Negative for back pain. Skin: Negative for rash. Neurological: Syncope  10-point ROS otherwise negative.  ____________________________________________   PHYSICAL EXAM:  VITAL SIGNS: ED Triage Vitals  Enc Vitals Group     BP 04/06/15 2254 129/68 mmHg     Pulse Rate 04/06/15 2254 55     Resp 04/06/15 2254 16     Temp 04/06/15 2254 98.4 F (36.9 C)     Temp Source 04/06/15 2254 Oral     SpO2 04/06/15 2254 100 %     Weight 04/06/15 2254 110 lb  (49.896 kg)     Height 04/06/15 2254 5\' 3"  (1.6 m)     Head Cir --      Peak Flow --      Pain Score 04/06/15 2257 0     Pain Loc --      Pain Edu? --      Excl. in [place]? --     Constitutional: Alert and oriented. Well appearing and in no acute distress. Eyes: Conjunctivae are normal. PERRL. EOMI. Head: Atraumatic. Nose: No congestion/rhinnorhea. Mouth/Throat: Mucous membranes are moist.  Oropharynx non-erythematous. Cardiovascular: Normal rate, regular rhythm. Grossly normal heart sounds.  Good peripheral circulation. Respiratory: Normal respiratory effort.  No retractions. Lungs CTAB. Gastrointestinal: Soft and nontender. No distention. Positive bowel sounds Musculoskeletal: No lower extremity tenderness nor edema.   Neurologic:  Normal speech and language. No gross focal neurologic deficits are appreciated. Cranial nerves II through XII are grossly intact Skin:  Skin is warm, dry and intact.  Psychiatric: Mood and affect are normal.   ____________________________________________   LABS (all labs ordered are listed, but only abnormal results are displayed)  Labs Reviewed  CBC - Abnormal; Notable for the following:    WBC 11.3 (*)    Hemoglobin 10.8 (*)    HCT 32.9 (*)    RDW 18.1 (*)    All other components within normal limits  COMPREHENSIVE METABOLIC PANEL - Abnormal; Notable for the following:    Potassium 3.4 (*)    Total Bilirubin 0.2 (*)    All other components within normal limits  TROPONIN I - Abnormal; Notable for the following:    Troponin I 0.07 (*)    All other components within normal limits  TROPONIN I   ____________________________________________  EKG  ED ECG REPORT I, Loney Hering, the attending physician, personally viewed and interpreted this ECG.   Date: 04/06/2015  EKG Time: 2356  Rate: 61  Rhythm: normal sinus rhythm  Axis: Normal  Intervals:none  ST&T Change: None  ____________________________________________  RADIOLOGY  CT  head: No acute intracranial pathology seen on CT, mild to moderate cortical volume loss and scattered small vessel ischemic microangiopathy, mild apparent for many of the right optic globe ____________________________________________   PROCEDURES  Procedure(s) performed: None  Critical Care performed: No  ____________________________________________   INITIAL IMPRESSION / ASSESSMENT AND PLAN / ED COURSE  Pertinent labs & imaging results that were available during my care of the patient were reviewed by me and considered in my medical decision making (see chart for details).  This is a 65 year old female who comes into the hospital today with dizziness, syncope and vomiting. The patient's initial set of blood work is unremarkable and she at  this time feels comfortable. She denies any complaints of chest pain or shortness of breath as well as abdominal pain. I will repeat the patient's troponin and reassess the patient.  The patient's repeat troponin was 0.07. I contacted Dr. Towanda Malkin as the patient did not want to stay in the hospital. He reports that he should be able to see the patient either today or tomorrow and just have him call his office. Otherwise the patient remains chest pain-free and is comfortable at this time. She'll be discharged home. ____________________________________________   FINAL CLINICAL IMPRESSION(S) / ED DIAGNOSES  Final diagnoses:  Dizziness  Syncope, unspecified syncope type      Loney Hering, MD 04/07/15 [phone]

## 2015-04-27 DIAGNOSIS — G479 Sleep disorder, unspecified: Secondary | ICD-10-CM | POA: Insufficient documentation

## 2015-06-01 DIAGNOSIS — K219 Gastro-esophageal reflux disease without esophagitis: Secondary | ICD-10-CM | POA: Insufficient documentation

## 2015-06-02 DIAGNOSIS — D500 Iron deficiency anemia secondary to blood loss (chronic): Secondary | ICD-10-CM | POA: Insufficient documentation

## 2015-06-14 ENCOUNTER — Ambulatory Visit: Payer: Medicare (Managed Care) | Attending: Pediatrics

## 2015-06-20 ENCOUNTER — Encounter: Payer: Self-pay | Admitting: Emergency Medicine

## 2015-06-20 ENCOUNTER — Emergency Department: Payer: Medicare HMO

## 2015-06-20 DIAGNOSIS — S93402A Sprain of unspecified ligament of left ankle, initial encounter: Secondary | ICD-10-CM | POA: Diagnosis not present

## 2015-06-20 DIAGNOSIS — Y9301 Activity, walking, marching and hiking: Secondary | ICD-10-CM | POA: Insufficient documentation

## 2015-06-20 DIAGNOSIS — W1840XA Slipping, tripping and stumbling without falling, unspecified, initial encounter: Secondary | ICD-10-CM | POA: Diagnosis not present

## 2015-06-20 DIAGNOSIS — Y999 Unspecified external cause status: Secondary | ICD-10-CM | POA: Insufficient documentation

## 2015-06-20 DIAGNOSIS — Y929 Unspecified place or not applicable: Secondary | ICD-10-CM | POA: Diagnosis not present

## 2015-06-20 DIAGNOSIS — Z87891 Personal history of nicotine dependence: Secondary | ICD-10-CM | POA: Diagnosis not present

## 2015-06-20 DIAGNOSIS — M25572 Pain in left ankle and joints of left foot: Secondary | ICD-10-CM | POA: Diagnosis present

## 2015-06-20 DIAGNOSIS — Z79899 Other long term (current) drug therapy: Secondary | ICD-10-CM | POA: Diagnosis not present

## 2015-06-20 NOTE — ED Notes (Signed)
Pt says she tripped yesterday while outside; c/o left ankle pain; swelling present;

## 2015-06-21 ENCOUNTER — Emergency Department
Admission: EM | Admit: 2015-06-21 | Discharge: 2015-06-21 | Disposition: A | Discharge status: Home / Self Care | Payer: Medicare HMO | Attending: Emergency Medicine | Admitting: Emergency Medicine

## 2015-06-21 DIAGNOSIS — S93402A Sprain of unspecified ligament of left ankle, initial encounter: Secondary | ICD-10-CM

## 2015-06-21 MED ORDER — AIRGO ROLLING WALKER MISC
1.0000 [IU] | Status: DC
Start: 2015-06-21 — End: 2016-11-27

## 2015-06-21 NOTE — Discharge Instructions (Signed)
Ankle Sprain  An ankle sprain is an injury to the strong, fibrous tissues (ligaments) that hold the bones of your ankle joint together.   CAUSES  An ankle sprain is usually caused by a fall or by twisting your ankle. Ankle sprains most commonly occur when you step on the outer edge of your foot, and your ankle turns inward. People who participate in sports are more prone to these types of injuries.   SYMPTOMS    Pain in your ankle. The pain may be present at rest or only when you are trying to stand or walk.   Swelling.   Bruising. Bruising may develop immediately or within 1 to 2 days after your injury.   Difficulty standing or walking, particularly when turning corners or changing directions.  DIAGNOSIS   Your caregiver will ask you details about your injury and perform a physical exam of your ankle to determine if you have an ankle sprain. During the physical exam, your caregiver will press on and apply pressure to specific areas of your foot and ankle. Your caregiver will try to move your ankle in certain ways. An X-ray exam may be done to be sure a bone was not broken or a ligament did not separate from one of the bones in your ankle (avulsion fracture).   TREATMENT   Certain types of braces can help stabilize your ankle. Your caregiver can make a recommendation for this. Your caregiver may recommend the use of medicine for pain. If your sprain is severe, your caregiver may refer you to a surgeon who helps to restore function to parts of your skeletal system (orthopedist) or a physical therapist.  HOME CARE INSTRUCTIONS    Apply ice to your injury for 1-2 days or as directed by your caregiver. Applying ice helps to reduce inflammation and pain.    Put ice in a plastic bag.    Place a towel between your skin and the bag.    Leave the ice on for 15-20 minutes at a time, every 2 hours while you are awake.   Only take over-the-counter or prescription medicines for pain, discomfort, or fever as directed by  your caregiver.   Elevate your injured ankle above the level of your heart as much as possible for 2-3 days.   If your caregiver recommends crutches, use them as instructed. Gradually put weight on the affected ankle. Continue to use crutches or a cane until you can walk without feeling pain in your ankle.   If you have a plaster splint, wear the splint as directed by your caregiver. Do not rest it on anything harder than a pillow for the first 24 hours. Do not put weight on it. Do not get it wet. You may take it off to take a shower or bath.   You may have been given an elastic bandage to wear around your ankle to provide support. If the elastic bandage is too tight (you have numbness or tingling in your foot or your foot becomes cold and blue), adjust the bandage to make it comfortable.   If you have an air splint, you may blow more air into it or let air out to make it more comfortable. You may take your splint off at night and before taking a shower or bath. Wiggle your toes in the splint several times per day to decrease swelling.  SEEK MEDICAL CARE IF:    You have rapidly increasing bruising or swelling.   Your toes feel   extremely cold or you lose feeling in your foot.   Your pain is not relieved with medicine.  SEEK IMMEDIATE MEDICAL CARE IF:   Your toes are numb or blue.   You have severe pain that is increasing.  MAKE SURE YOU:    Understand these instructions.   Will watch your condition.   Will get help right away if you are not doing well or get worse.     This information is not intended to replace advice given to you by your health care provider. Make sure you discuss any questions you have with your health care provider.     Document Released: 01/23/2005 Document Revised: 02/13/2014 Document Reviewed: 02/04/2011  Elsevier Interactive Patient Education 2016 [employer]

## 2015-06-21 NOTE — ED Provider Notes (Signed)
[facility] Emergency Department Provider Note  Time seen: 12:58 AM  I have reviewed the triage vital signs and the nursing notes.   HISTORY  Chief Complaint Ankle Pain    HPI Jamie Boyd is a 65 y.o. female with a past medical history of dementia, presents the emergency department with left ankle pain. According to the patient and her caregiver yesterday she tripped and rolled her left ankle. Has been able to walk on it but with discomfort and a limp. Denies any other injuries. Denies falling or hitting her head. Describes the pain as mild to moderate when walking on the ankle, nearly gone when not placing weight on the ankle. Dull pain.     Past Medical History  Diagnosis Date  . Dementia   . Anemia   . Memory deficit     There are no active problems to display for this patient.   Past Surgical History  Procedure Laterality Date  . Colonoscopy with propofol N/A 02/18/2015    Procedure: COLONOSCOPY WITH PROPOFOL;  Surgeon: Hulen Luster, MD;  Location: [facility] ENDOSCOPY;  Service: Gastroenterology;  Laterality: N/A;  . Esophagogastroduodenoscopy N/A 02/18/2015    Procedure: ESOPHAGOGASTRODUODENOSCOPY (EGD);  Surgeon: Hulen Luster, MD;  Location: [facility] ENDOSCOPY;  Service: Gastroenterology;  Laterality: N/A;    Current Outpatient Rx  Name  Route  Sig  Dispense  Refill  . cephALEXin (KEFLEX) 500 MG capsule   Oral   Take 1 capsule (500 mg total) by mouth 3 (three) times daily. Patient not taking: Reported on 02/24/2015   30 capsule   0   . docusate sodium (COLACE) 100 MG capsule   Oral   Take 1 capsule by mouth 2 (two) times daily as needed.         . donepezil (ARICEPT) 5 MG tablet   Oral   Take 5 mg by mouth at bedtime.         Marland Kitchen esomeprazole (NEXIUM) 40 MG capsule   Oral   Take 1 capsule (40 mg total) by mouth daily.   30 capsule   1   . famotidine (PEPCID) 20 MG tablet   Oral   Take 1 tablet (20 mg total) by mouth 2 (two) times daily as  needed for heartburn or indigestion. Patient not taking: Reported on 04/07/2015   10 tablet   0   . Multiple Vitamins-Minerals (CENTRUM SILVER ULTRA WOMENS PO)   Oral   Take 1 tablet by mouth daily.          . traZODone (DESYREL) 50 MG tablet   Oral   Take 150 mg by mouth at bedtime.            Allergies Review of patient's allergies indicates no known allergies.  History reviewed. No pertinent family history.  Social History Social History  Substance Use Topics  . Smoking status: Former Research scientist (life sciences)  . Smokeless tobacco: Never Used  . Alcohol Use: No    Review of Systems Constitutional: Negative for fever. Cardiovascular: Negative for chest pain. Respiratory: Negative for shortness of breath. Gastrointestinal: Negative for abdominal pain Musculoskeletal: Left ankle pain and swelling Neurological: Negative for headache 10-point ROS otherwise negative.  ____________________________________________   PHYSICAL EXAM:  VITAL SIGNS: ED Triage Vitals  Enc Vitals Group     BP 06/20/15 2211 121/63 mmHg     Pulse Rate 06/20/15 2211 66     Resp 06/20/15 2211 20     Temp 06/20/15 2211 98.3 F (36.8 C)  Temp Source 06/20/15 2211 Oral     SpO2 06/20/15 2211 99 %     Weight 06/20/15 2211 130 lb (58.968 kg)     Height 06/20/15 2211 5\' 3"  (1.6 m)     Head Cir --      Peak Flow --      Pain Score 06/20/15 2219 10     Pain Loc --      Pain Edu? --      Excl. in [place]? --     Constitutional: Alert and oriented. Well appearing and in no distress. Eyes: Normal exam ENT   Head: Normocephalic and atraumatic   Mouth/Throat: Mucous membranes are moist. Cardiovascular: Normal rate, regular rhythm. No murmur Respiratory: Normal respiratory effort without tachypnea nor retractions. Breath sounds are clear Gastrointestinal: Soft and nontender. No distention.   Musculoskeletal: Mild swelling over the medial aspect of the left ankle, moderate tenderness to palpation over the  same area. 2+ DP pulse. Sensation intact. Neurologic:  Normal speech and language. No gross focal neurologic deficits  Skin:  Skin is warm, dry and intact.  Psychiatric: Mood and affect are normal.  ____________________________________________     RADIOLOGY  X-ray negative for fracture.  ____________________________________________    INITIAL IMPRESSION / ASSESSMENT AND PLAN / ED COURSE  Pertinent labs & imaging results that were available during my care of the patient were reviewed by me and considered in my medical decision making (see chart for details).  Patient presents with left ankle pain and swelling since a injury yesterday. X-rays negative for fracture. Patient does have mild swelling with moderate tenderness palpation of the medial aspect of the left ankle. We will place in a removable air splint, and prescribed a walker with weightbearing as tolerated, and Tylenol for discomfort. Patient and caregiver are agreeable to plan. We will provide orthopedic follow-up information if the patient continues to have discomfort in 5-7 days.  ____________________________________________   FINAL CLINICAL IMPRESSION(S) / ED DIAGNOSES  Left ankle pain/sprain   Harvest Dark, MD 06/21/15 [id number]

## 2015-08-02 ENCOUNTER — Emergency Department
Admission: EM | Admit: 2015-08-02 | Discharge: 2015-08-02 | Disposition: A | Discharge status: Home / Self Care | Payer: Medicare HMO | Attending: Emergency Medicine | Admitting: Emergency Medicine

## 2015-08-02 ENCOUNTER — Encounter: Payer: Self-pay | Admitting: Emergency Medicine

## 2015-08-02 DIAGNOSIS — Z87891 Personal history of nicotine dependence: Secondary | ICD-10-CM | POA: Diagnosis not present

## 2015-08-02 DIAGNOSIS — R4182 Altered mental status, unspecified: Secondary | ICD-10-CM | POA: Diagnosis present

## 2015-08-02 DIAGNOSIS — Z792 Long term (current) use of antibiotics: Secondary | ICD-10-CM | POA: Insufficient documentation

## 2015-08-02 DIAGNOSIS — Z79899 Other long term (current) drug therapy: Secondary | ICD-10-CM | POA: Diagnosis not present

## 2015-08-02 DIAGNOSIS — Z8659 Personal history of other mental and behavioral disorders: Secondary | ICD-10-CM | POA: Insufficient documentation

## 2015-08-02 DIAGNOSIS — F0390 Unspecified dementia without behavioral disturbance: Secondary | ICD-10-CM | POA: Insufficient documentation

## 2015-08-02 LAB — COMPREHENSIVE METABOLIC PANEL
ALT: 19 U/L (ref 14–54)
AST: 27 U/L (ref 15–41)
Albumin: 3.9 g/dL (ref 3.5–5.0)
Alkaline Phosphatase: 67 U/L (ref 38–126)
Anion gap: 5 (ref 5–15)
BILIRUBIN TOTAL: 0.2 mg/dL — AB (ref 0.3–1.2)
BUN: 13 mg/dL (ref 6–20)
CHLORIDE: 106 mmol/L (ref 101–111)
CO2: 28 mmol/L (ref 22–32)
CREATININE: 0.79 mg/dL (ref 0.44–1.00)
Calcium: 9.2 mg/dL (ref 8.9–10.3)
Glucose, Bld: 101 mg/dL — ABNORMAL HIGH (ref 65–99)
POTASSIUM: 4.3 mmol/L (ref 3.5–5.1)
Sodium: 139 mmol/L (ref 135–145)
TOTAL PROTEIN: 7 g/dL (ref 6.5–8.1)

## 2015-08-02 LAB — CBC
HEMATOCRIT: 33.4 % — AB (ref 35.0–47.0)
Hemoglobin: 11.4 g/dL — ABNORMAL LOW (ref 12.0–16.0)
MCH: 31.1 pg (ref 26.0–34.0)
MCHC: 34.1 g/dL (ref 32.0–36.0)
MCV: 91.4 fL (ref 80.0–100.0)
PLATELETS: 276 10*3/uL (ref 150–440)
RBC: 3.66 MIL/uL — ABNORMAL LOW (ref 3.80–5.20)
RDW: 14.2 % (ref 11.5–14.5)
WBC: 7.8 10*3/uL (ref 3.6–11.0)

## 2015-08-02 NOTE — ED Provider Notes (Signed)
[facility] Emergency Department Provider Note  ____________________________________________  Time seen: Approximately 10:44 AM  I have reviewed the triage vital signs and the nursing notes.   HISTORY  Chief Complaint Altered Mental Status    HPI Jamie Boyd is a 65 y.o. female with a history of dementia brought by her family members for worsening dementia.The patient lives with 4 members from her family who give her 65 hour care. She's had a progressive decline for years, but over the last 2 weeks her symptoms have worsened. The family describe that she is no longer paying attention to the posted notes that they leave her on the home to help her. In addition she is changing closed 3 or 4 times daily. She sometimes walks aimlessly in the driveway. The patient herself, denies any of this. There are no medical complaints including fever, chills, nausea vomiting or diarrhea, trauma, headache.   Past Medical History  Diagnosis Date  . Dementia   . Anemia   . Memory deficit     There are no active problems to display for this patient.   Past Surgical History  Procedure Laterality Date  . Colonoscopy with propofol N/A 02/18/2015    Procedure: COLONOSCOPY WITH PROPOFOL;  Surgeon: Hulen Luster, MD;  Location: [facility] ENDOSCOPY;  Service: Gastroenterology;  Laterality: N/A;  . Esophagogastroduodenoscopy N/A 02/18/2015    Procedure: ESOPHAGOGASTRODUODENOSCOPY (EGD);  Surgeon: Hulen Luster, MD;  Location: [facility] ENDOSCOPY;  Service: Gastroenterology;  Laterality: N/A;    Current Outpatient Rx  Name  Route  Sig  Dispense  Refill  . cephALEXin (KEFLEX) 500 MG capsule   Oral   Take 1 capsule (500 mg total) by mouth 3 (three) times daily. Patient not taking: Reported on 02/24/2015   30 capsule   0   . docusate sodium (COLACE) 100 MG capsule   Oral   Take 1 capsule by mouth 2 (two) times daily as needed.         . donepezil (ARICEPT) 5 MG tablet   Oral   Take 5 mg by  mouth at bedtime.         Marland Kitchen esomeprazole (NEXIUM) 40 MG capsule   Oral   Take 1 capsule (40 mg total) by mouth daily.   30 capsule   1   . famotidine (PEPCID) 20 MG tablet   Oral   Take 1 tablet (20 mg total) by mouth 2 (two) times daily as needed for heartburn or indigestion. Patient not taking: Reported on 04/07/2015   10 tablet   0   . Misc. Devices (AIRGO ROLLING WALKER) MISC   Does not apply   1 Units by Does not apply route as directed.   1 each   0     OK to substitute brand for appropriate rolling wal ...   . Multiple Vitamins-Minerals (CENTRUM SILVER ULTRA WOMENS PO)   Oral   Take 1 tablet by mouth daily.          . traZODone (DESYREL) 50 MG tablet   Oral   Take 150 mg by mouth at bedtime.            Allergies Review of patient's allergies indicates no known allergies.  No family history on file.  Social History Social History  Substance Use Topics  . Smoking status: Former Research scientist (life sciences)  . Smokeless tobacco: Never Used  . Alcohol Use: No    Review of Systems Constitutional: No fever/chills.Positive worsening dementia. Eyes: No visual changes. ENT:  No sore throat. No congestion or rhinorrhea. Cardiovascular: Denies chest pain. Denies palpitations. Respiratory: Denies shortness of breath.  No cough. Gastrointestinal: No abdominal pain.  No nausea, no vomiting.  No diarrhea.  No constipation. Genitourinary: Negative for dysuria. Musculoskeletal: Negative for back pain. Skin: Negative for rash. Neurological: Negative for headaches. No focal numbness, tingling or weakness. No difficulty walking. No visual changes. No speech changes.  10-point ROS otherwise negative.  ____________________________________________   PHYSICAL EXAM:  VITAL SIGNS: ED Triage Vitals  Enc Vitals Group     BP 08/02/15 1005 119/77 mmHg     Pulse Rate 08/02/15 1005 88     Resp 08/02/15 1005 18     Temp 08/02/15 1005 98.7 F (37.1 C)     Temp Source 08/02/15 1005 Oral      SpO2 08/02/15 1005 100 %     Weight 08/02/15 1005 135 lb (61.236 kg)     Height 08/02/15 1005 5\' 3"  (1.6 m)     Head Cir --      Peak Flow --      Pain Score --      Pain Loc --      Pain Edu? --      Excl. in [place]? --     Constitutional: Patient is alert and able to answer basic questions. She is able tablet without any difficulty. She is calm and compliant at the time of my exam.  Eyes: Conjunctivae are normal.  EOMI. No scleral icterus. Head: Atraumatic. Nose: No congestion/rhinnorhea. Mouth/Throat: Mucous membranes are moist.  Neck: No stridor.  Supple.  No JVD. No meningismus. Cardiovascular: Normal rate, regular rhythm. No murmurs, rubs or gallops.  Respiratory: Normal respiratory effort.  No accessory muscle use or retractions. Lungs CTAB.  No wheezes, rales or ronchi. Gastrointestinal: Soft, nontender and nondistended.  No guarding or rebound.  No peritoneal signs. Musculoskeletal: No LE edema.  Neurologic:  Alert to person and place, thinks it's 2016 and the month of May. She states it is Wednesday but today is Monday.  Speech is clear.  Face and smile are symmetric.  EOMI.  Moves all extremities well. Normal gait without ataxia. Skin:  Skin is warm, dry and intact. No rash noted. Psychiatric: Mood and affect are normal.   ____________________________________________   LABS (all labs ordered are listed, but only abnormal results are displayed)  Labs Reviewed  CBC - Abnormal; Notable for the following:    RBC 3.66 (*)    Hemoglobin 11.4 (*)    HCT 33.4 (*)    All other components within normal limits  COMPREHENSIVE METABOLIC PANEL - Abnormal; Notable for the following:    Glucose, Bld 101 (*)    Total Bilirubin 0.2 (*)    All other components within normal limits  URINALYSIS COMPLETEWITH MICROSCOPIC ([facility] ONLY) - Abnormal; Notable for the following:    Color, Urine STRAW (*)    APPearance CLEAR (*)    Glucose, UA 50 (*)    Leukocytes, UA 2+ (*)    All other  components within normal limits   ____________________________________________  EKG  Not indicated ____________________________________________  RADIOLOGY  No results found.  ____________________________________________   PROCEDURES  Procedure(s) performed: None  Critical Care performed: No ____________________________________________   INITIAL IMPRESSION / ASSESSMENT AND PLAN / ED COURSE  Pertinent labs & imaging results that were available during my care of the patient were reviewed by me and considered in my medical decision making (see chart for details).  65 y.o.  female with a history of dementia presenting with symptoms consistent with progressive dementia. We will look for any acute exacerbating factor such as urinary tract infection, or abnormal electrolytes. I have had a long conversation with the family, who do feel like they are able to care for her even though she is getting progressively worse. The family member who is with her who is also her POA, does not feel that the patient is a harm to herself or anybody around her. We have given the patient and her family some information about outpatient resources, and encouraged them to have a close follow-up with her PMD. The patient's laboratory studies and urinalysis are negative for any acute changes. Plan discharge. Return precautions as well as follow-up instructions were discussed.  ____________________________________________  FINAL CLINICAL IMPRESSION(S) / ED DIAGNOSES  Final diagnoses:  Dementia, without behavioral disturbance      NEW MEDICATIONS STARTED DURING THIS VISIT:  New Prescriptions   No medications on file     Eula Listen, MD 08/02/15 1128

## 2015-08-02 NOTE — ED Notes (Signed)
Pt ems from home because per ems POA said she was not acting right. Pt denies

## 2015-08-02 NOTE — Discharge Instructions (Signed)
Please take all precautions to remain safe at home. Please make an appointment with your primary care physician to discuss long-term management of dementia.  Return to the emergency department for changes in mental status, fever, nausea vomiting or diarrhea, changes in vision, speech or difficulty walking, or any other symptoms concerning to you.   Dementia Dementia is a general term for problems with brain function. A person with dementia has memory loss and a hard time with at least one other brain function such as thinking, speaking, or problem solving. Dementia can affect social functioning, how you do your job, your mood, or your personality. The changes may be hidden for a long time. The earliest forms of this disease are usually not detected by family or friends. Dementia can be:  Irreversible.  Potentially reversible.  Partially reversible.  Progressive. This means it can get worse over time. CAUSES  Irreversible dementia causes may include:  Degeneration of brain cells (Alzheimer disease or Lewy body dementia).  Multiple small strokes (vascular dementia).  Infection (chronic meningitis or Creutzfeldt-Jakob disease).  Frontotemporal dementia. This affects younger people, age 107 to 45, compared to those who have Alzheimer disease.  Dementia associated with other disorders like Parkinson disease, Huntington disease, or HIV-associated dementia. Potentially or partially reversible dementia causes may include:  Medicines.  Metabolic causes such as excessive alcohol intake, vitamin B12 deficiency, or thyroid disease.  Masses or pressure in the brain such as a tumor, blood clot, or hydrocephalus. SIGNS AND SYMPTOMS  Symptoms are often hard to detect. Family members or coworkers may not notice them early in the disease process. Different people with dementia may have different symptoms. Symptoms can include:  A hard time with memory, especially recent memory. Long-term memory  may not be impaired.  Asking the same question multiple times or forgetting something someone just said.  A hard time speaking your thoughts or finding certain words.  A hard time solving problems or performing familiar tasks (such as how to use a telephone).  Sudden changes in mood.  Changes in personality, especially increasing moodiness or mistrust.  Depression.  A hard time understanding complex ideas that were never a problem in the past. DIAGNOSIS  There are no specific tests for dementia.   Your health care provider may recommend a thorough evaluation. This is because some forms of dementia can be reversible. The evaluation will likely include a physical exam and getting a detailed history from you and a family member. The history often gives the best clues and suggestions for a diagnosis.  Memory testing may be done. A detailed brain function evaluation called neuropsychologic testing may be helpful.  Lab tests and brain imaging (such as a CT scan or MRI scan) are sometimes important.  Sometimes observation and re-evaluation over time is very helpful. TREATMENT  Treatment depends on the cause.   If the problem is a vitamin deficiency, it may be helped or cured with supplements.  For dementias such as Alzheimer disease, medicines are available to stabilize or slow the course of the disease. There are no cures for this type of dementia.  Your health care provider can help direct you to groups, organizations, and other health care providers to help with decisions in the care of you or your loved one. HOME CARE INSTRUCTIONS The care of individuals with dementia is varied and dependent upon the progression of the dementia. The following suggestions are intended for the person living with, or caring for, the person with dementia.  Create  a safe environment.  Remove the locks on bathroom doors to prevent the person from accidentally locking himself or herself in.  Use  childproof latches on kitchen cabinets and any place where cleaning supplies, chemicals, or alcohol are kept.  Use childproof covers in unused electrical outlets.  Install childproof devices to keep doors and windows secured.  Remove stove knobs or install safety knobs and an automatic shut-off on the stove.  Lower the temperature on water heaters.  Label medicines and keep them locked up.  Secure knives, lighters, matches, power tools, and guns, and keep these items out of reach.  Keep the house free from clutter. Remove rugs or anything that might contribute to a fall.  Remove objects that might break and hurt the person.  Make sure lighting is good, both inside and outside.  Install grab rails as needed.  Use a monitoring device to alert you to falls or other needs for help.  Reduce confusion.  Keep familiar objects and people around.  Use night lights or dim lights at night.  Label items or areas.  Use reminders, notes, or directions for daily activities or tasks.  Keep a simple, consistent routine for waking, meals, bathing, dressing, and bedtime.  Create a calm, quiet environment.  Place large clocks and calendars prominently.  Display emergency numbers and home address near all telephones.  Use cues to establish different times of the day. An example is to open curtains to let the natural light in during the day.   Use effective communication.  Choose simple words and short sentences.  Use a gentle, calm tone of voice.  Be careful not to interrupt.  If the person is struggling to find a word or communicate a thought, try to provide the word or thought.  Ask one question at a time. Allow the person ample time to answer questions. Repeat the question again if the person does not respond.  Reduce nighttime restlessness.  Provide a comfortable bed.  Have a consistent nighttime routine.  Ensure a regular walking or physical activity schedule. Involve  the person in daily activities as much as possible.  Limit napping during the day.  Limit caffeine.  Attend social events that stimulate rather than overwhelm the senses.  Encourage good nutrition and hydration.  Reduce distractions during meal times and snacks.  Avoid foods that are too hot or too cold.  Monitor chewing and swallowing ability.  Continue with routine vision, hearing, dental, and medical screenings.  Give medicines only as directed by the health care provider.  Monitor driving abilities. Do not allow the person to drive when safe driving is no longer possible.  Register with an identification program which could provide location assistance in the event of a missing person situation. SEEK MEDICAL CARE IF:   New behavioral problems start such as moodiness, aggressiveness, or seeing things that are not there (hallucinations).  Any new problem with brain function happens. This includes problems with balance, speech, or falling a lot.  Problems with swallowing develop.  Any symptoms of other illness happen. Small changes or worsening in any aspect of brain function can be a sign that the illness is getting worse. It can also be a sign of another medical illness such as infection. Seeing a health care provider right away is important. SEEK IMMEDIATE MEDICAL CARE IF:   A fever develops.  New or worsened confusion develops.  New or worsened sleepiness develops.  Staying awake becomes hard to do.   This information  is not intended to replace advice given to you by your health care provider. Make sure you discuss any questions you have with your health care provider.   Document Released: 07/19/2000 Document Revised: 02/13/2014 Document Reviewed: 06/20/2010 Elsevier Interactive Patient Education [employer].

## 2015-08-06 ENCOUNTER — Telehealth: Payer: Self-pay | Admitting: Emergency Medicine

## 2015-08-06 LAB — URINALYSIS COMPLETE WITH MICROSCOPIC (ARMC ONLY)
Bacteria, UA: NONE SEEN
Bilirubin Urine: NEGATIVE
GLUCOSE, UA: 50 mg/dL — AB
HGB URINE DIPSTICK: NEGATIVE
KETONES UR: NEGATIVE mg/dL
NITRITE: NEGATIVE
Protein, ur: NEGATIVE mg/dL
SPECIFIC GRAVITY, URINE: 1.005 (ref 1.005–1.030)
pH: 6 (ref 5.0–8.0)

## 2015-08-06 NOTE — ED Notes (Signed)
Lab sent corrected ua report.  Faxed to dr Iona Beard.

## 2015-09-11 DIAGNOSIS — K219 Gastro-esophageal reflux disease without esophagitis: Secondary | ICD-10-CM | POA: Insufficient documentation

## 2015-10-06 ENCOUNTER — Ambulatory Visit: Payer: Medicare (Managed Care) | Attending: Pediatrics

## 2015-10-07 ENCOUNTER — Ambulatory Visit
Admission: RE | Admit: 2015-10-07 | Discharge: 2015-10-07 | Disposition: A | Discharge status: Home / Self Care | Payer: Medicare HMO | Source: Ambulatory Visit | Attending: Pediatrics | Admitting: Pediatrics

## 2015-10-07 DIAGNOSIS — M810 Age-related osteoporosis without current pathological fracture: Secondary | ICD-10-CM | POA: Insufficient documentation

## 2015-10-07 DIAGNOSIS — Z780 Asymptomatic menopausal state: Secondary | ICD-10-CM | POA: Diagnosis not present

## 2015-10-07 DIAGNOSIS — F0390 Unspecified dementia without behavioral disturbance: Secondary | ICD-10-CM | POA: Insufficient documentation

## 2015-10-07 DIAGNOSIS — Z87891 Personal history of nicotine dependence: Secondary | ICD-10-CM | POA: Insufficient documentation

## 2015-11-08 ENCOUNTER — Encounter: Payer: Self-pay | Admitting: Emergency Medicine

## 2015-11-08 ENCOUNTER — Emergency Department: Payer: Medicare HMO

## 2015-11-08 ENCOUNTER — Emergency Department
Admission: EM | Admit: 2015-11-08 | Discharge: 2015-11-08 | Disposition: A | Discharge status: Home / Self Care | Payer: Medicare HMO | Attending: Emergency Medicine | Admitting: Emergency Medicine

## 2015-11-08 DIAGNOSIS — Z87891 Personal history of nicotine dependence: Secondary | ICD-10-CM | POA: Insufficient documentation

## 2015-11-08 DIAGNOSIS — N3000 Acute cystitis without hematuria: Secondary | ICD-10-CM | POA: Diagnosis not present

## 2015-11-08 DIAGNOSIS — Z79899 Other long term (current) drug therapy: Secondary | ICD-10-CM | POA: Insufficient documentation

## 2015-11-08 DIAGNOSIS — R1032 Left lower quadrant pain: Secondary | ICD-10-CM | POA: Diagnosis present

## 2015-11-08 DIAGNOSIS — R1030 Lower abdominal pain, unspecified: Secondary | ICD-10-CM

## 2015-11-08 LAB — URINALYSIS COMPLETE WITH MICROSCOPIC (ARMC ONLY)
Bilirubin Urine: NEGATIVE
Glucose, UA: NEGATIVE mg/dL
Hgb urine dipstick: NEGATIVE
Ketones, ur: NEGATIVE mg/dL
Nitrite: NEGATIVE
PH: 7 (ref 5.0–8.0)
PROTEIN: NEGATIVE mg/dL
Specific Gravity, Urine: 1.002 — ABNORMAL LOW (ref 1.005–1.030)

## 2015-11-08 LAB — COMPREHENSIVE METABOLIC PANEL
ALBUMIN: 3.6 g/dL (ref 3.5–5.0)
ALT: 19 U/L (ref 14–54)
AST: 28 U/L (ref 15–41)
Alkaline Phosphatase: 60 U/L (ref 38–126)
Anion gap: 6 (ref 5–15)
BUN: 12 mg/dL (ref 6–20)
CHLORIDE: 106 mmol/L (ref 101–111)
CO2: 27 mmol/L (ref 22–32)
Calcium: 8.8 mg/dL — ABNORMAL LOW (ref 8.9–10.3)
Creatinine, Ser: 0.75 mg/dL (ref 0.44–1.00)
GFR calc Af Amer: >60 mL/min (ref >60)
Glucose, Bld: 89 mg/dL (ref 65–99)
POTASSIUM: 4 mmol/L (ref 3.5–5.1)
Sodium: 139 mmol/L (ref 135–145)
Total Bilirubin: 0.5 mg/dL (ref 0.3–1.2)
Total Protein: 7.3 g/dL (ref 6.5–8.1)

## 2015-11-08 LAB — CBC
HEMATOCRIT: 35.3 % (ref 35.0–47.0)
HEMOGLOBIN: 12.2 g/dL (ref 12.0–16.0)
MCH: 31.9 pg (ref 26.0–34.0)
MCHC: 34.4 g/dL (ref 32.0–36.0)
MCV: 92.5 fL (ref 80.0–100.0)
Platelets: 255 10*3/uL (ref 150–440)
RBC: 3.82 MIL/uL (ref 3.80–5.20)
RDW: 13.4 % (ref 11.5–14.5)
WBC: 9.2 10*3/uL (ref 3.6–11.0)

## 2015-11-08 LAB — LIPASE, BLOOD: LIPASE: 37 U/L (ref 11–51)

## 2015-11-08 MED ORDER — MORPHINE SULFATE (PF) 4 MG/ML IV SOLN
4.0000 mg | Freq: Once | INTRAVENOUS | Status: AC
  Administered 2015-11-08: 4 mg via INTRAVENOUS
  Filled 2015-11-08: qty 1

## 2015-11-08 MED ORDER — CEPHALEXIN 500 MG PO CAPS: 500.0000 mg | ORAL_CAPSULE | Freq: Three times a day (TID) | ORAL | 0 refills | Status: AC

## 2015-11-08 MED ORDER — SODIUM CHLORIDE 0.9 % IV BOLUS (SEPSIS)
1000.0000 mL | Freq: Once | INTRAVENOUS | Status: AC
  Administered 2015-11-08: 1000 mL via INTRAVENOUS

## 2015-11-08 MED ORDER — DEXTROSE 5 % IV SOLN
1.0000 g | Freq: Once | INTRAVENOUS | Status: AC
  Administered 2015-11-08: 1 g via INTRAVENOUS
  Filled 2015-11-08: qty 10

## 2015-11-08 MED ORDER — ONDANSETRON HCL 4 MG/2ML IJ SOLN
4.0000 mg | Freq: Once | INTRAMUSCULAR | Status: AC
  Administered 2015-11-08: 4 mg via INTRAVENOUS
  Filled 2015-11-08: qty 2

## 2015-11-08 NOTE — ED Triage Notes (Signed)
Patient woke up this AM with LLQ abdominal pain.  Denies nausea or vomiting.  Last BM - yesterday, describes as normal.  Denies any other symptoms.

## 2015-11-08 NOTE — Discharge Instructions (Signed)

## 2015-11-08 NOTE — ED Provider Notes (Signed)
[facility] Emergency Department Provider Note  ____________________________________________  Time seen: Approximately 11:15 AM  I have reviewed the triage vital signs and the nursing notes.   HISTORY  Chief Complaint Abdominal Pain   HPI Jamie Boyd is a 65 y.o. female history of anemia and mild dementia who presents for evaluation of left-sided abdominal pain. Patient reports 1 day of dysuria and frequency. This morning she started having left lower quadrant abdominal pain that she describes as throbbing, 9/10, constant, nonradiating. She denies nausea or vomiting, diarrhea or constipation. Had a normal bowel movement yesterday. She denies hematuria prior history of kidney stones. She had a colonoscopy in January 2017 which showed a few polyps but no evidence of diverticulosis. Patient has never had abdominal surgery in the past. Has never had similar pain. She denies fever or chills. No chest pain or shortness of breath.  Past Medical History:  Diagnosis Date  . Anemia   . Dementia   . Memory deficit     There are no active problems to display for this patient.   Past Surgical History:  Procedure Laterality Date  . COLONOSCOPY WITH PROPOFOL N/A 02/18/2015   Procedure: COLONOSCOPY WITH PROPOFOL;  Surgeon: Hulen Luster, MD;  Location: [facility] ENDOSCOPY;  Service: Gastroenterology;  Laterality: N/A;  . ESOPHAGOGASTRODUODENOSCOPY N/A 02/18/2015   Procedure: ESOPHAGOGASTRODUODENOSCOPY (EGD);  Surgeon: Hulen Luster, MD;  Location: [facility] ENDOSCOPY;  Service: Gastroenterology;  Laterality: N/A;    Prior to Admission medications   Medication Sig Start Date End Date Taking? Authorizing Provider  cephALEXin (KEFLEX) 500 MG capsule Take 1 capsule (500 mg total) by mouth 3 (three) times daily. 11/08/15 11/15/15  Rudene Re, MD  docusate sodium (COLACE) 100 MG capsule Take 1 capsule by mouth 2 (two) times daily as needed. 02/23/15 02/23/16  Historical Provider, MD    donepezil (ARICEPT) 5 MG tablet Take 5 mg by mouth at bedtime.    Historical Provider, MD  esomeprazole (NEXIUM) 40 MG capsule Take 1 capsule (40 mg total) by mouth daily. 02/24/15 02/24/16  Drenda Freeze, MD  famotidine (PEPCID) 20 MG tablet Take 1 tablet (20 mg total) by mouth 2 (two) times daily as needed for heartburn or indigestion. Patient not taking: Reported on 04/07/2015 02/24/15 02/24/16  Drenda Freeze, MD  Misc. Devices (AIRGO ROLLING WALKER) MISC 1 Units by Does not apply route as directed. 06/21/15   Harvest Dark, MD  Multiple Vitamins-Minerals (CENTRUM SILVER ULTRA WOMENS PO) Take 1 tablet by mouth daily.     Historical Provider, MD  traZODone (DESYREL) 50 MG tablet Take 150 mg by mouth at bedtime.     Historical Provider, MD    Allergies Review of patient's allergies indicates no known allergies.  No family history on file.  Social History Social History  Substance Use Topics  . Smoking status: Former Research scientist (life sciences)  . Smokeless tobacco: Never Used  . Alcohol use No    Review of Systems  Constitutional: Negative for fever. Eyes: Negative for visual changes. ENT: Negative for sore throat. Cardiovascular: Negative for chest pain. Respiratory: Negative for shortness of breath. Gastrointestinal: + L sided abdominal pain. No vomiting or diarrhea. Genitourinary: + dysuria. Musculoskeletal: Negative for back pain. Skin: Negative for rash. Neurological: Negative for headaches, weakness or numbness.  ____________________________________________   PHYSICAL EXAM:  VITAL SIGNS: ED Triage Vitals  Enc Vitals Group     BP 11/08/15 0957 133/80     Pulse Rate 11/08/15 0957 68  Resp 11/08/15 0957 16     Temp 11/08/15 0957 98 F (36.7 C)     Temp Source 11/08/15 0957 Oral     SpO2 11/08/15 0957 98 %     Weight 11/08/15 0957 132 lb (59.9 kg)     Height 11/08/15 0957 5\' 3"  (1.6 m)     Head Circumference --      Peak Flow --      Pain Score 11/08/15 1109 9     Pain  Loc --      Pain Edu? --      Excl. in [place]? --     Constitutional: Alert and oriented. Well appearing and in no apparent distress. HEENT:      Head: Normocephalic and atraumatic.         Eyes: Conjunctivae are normal. Sclera is non-icteric. EOMI. PERRL      Mouth/Throat: Mucous membranes are moist.       Neck: Supple with no signs of meningismus. Cardiovascular: Regular rate and rhythm. No murmurs, gallops, or rubs. 2+ symmetrical distal pulses are present in all extremities. No JVD. Respiratory: Normal respiratory effort. Lungs are clear to auscultation bilaterally. No wheezes, crackles, or rhonchi.  Gastrointestinal: Soft, ttp over the suprapubic and LLQ,  non distended with positive bowel sounds. No rebound or guarding. Genitourinary: No CVA tenderness. Musculoskeletal: Nontender with normal range of motion in all extremities. No edema, cyanosis, or erythema of extremities. Neurologic: Normal speech and language. Face is symmetric. Moving all extremities. No gross focal neurologic deficits are appreciated. Skin: Skin is warm, dry and intact. No rash noted. Psychiatric: Mood and affect are normal. Speech and behavior are normal.  ____________________________________________   LABS (all labs ordered are listed, but only abnormal results are displayed)  Labs Reviewed  COMPREHENSIVE METABOLIC PANEL - Abnormal; Notable for the following:       Result Value   Calcium 8.8 (*)    All other components within normal limits  URINALYSIS COMPLETEWITH MICROSCOPIC ([facility] ONLY) - Abnormal; Notable for the following:    Color, Urine COLORLESS (*)    APPearance CLEAR (*)    Specific Gravity, Urine 1.002 (*)    Leukocytes, UA 3+ (*)    Bacteria, UA RARE (*)    Squamous Epithelial / LPF 0-5 (*)    All other components within normal limits  URINE CULTURE  LIPASE, BLOOD  CBC    ____________________________________________  EKG  none ____________________________________________  RADIOLOGY  CT renal:  Left lower pole nonobstructing 11 mm stone. Extra renal type pelvis bilaterally without obstructing renal or ureteral calculi noted.  No extra luminal bowel inflammatory process, free fluid or free air.  Aortic atherosclerosis.  Scoliosis lumbar spine with degenerative changes and various degrees spinal stenosis and foraminal narrowing most prominent L4-5 level. ____________________________________________   PROCEDURES  Procedure(s) performed: None Procedures Critical Care performed:  None ____________________________________________   INITIAL IMPRESSION / ASSESSMENT AND PLAN / ED COURSE   65 y.o. female history of anemia and mild dementia who presents for evaluation of left-sided abdominal pain since this morning in the setting of 1 day of dysuria. Patient is well-appearing, in no distress, has normal vital signs, she has tenderness to palpation in the suprapubic and left lower quadrant, no flank tenderness. UA is positive for urinary tract infection. CMP, lipase, and CBC are all within normal limits. Since patient is having left-sided abdominal pain will pursue a CT renal protocol to rule out kidney stone. We'll treat UTI with IV ceftriaxone, I'll  give her IV fluids and IV morphine for the pain.  Clinical Course  Comment By Time  CT scan showing a renal kidney stone but no ureteral stone. No other acute findings. Remaining of her labs are within normal limits. Patient's pain has now resolved appears tolerating by mouth. She was given a dose of ceftriaxone for urinary tract infection. She'll be discharged home on Keflex and close follow-up with primary care doctor. Rudene Re, MD 10/02 1331    Pertinent labs & imaging results that were available during my care of the patient were reviewed by me and considered in my medical decision making (see  chart for details).    ____________________________________________   FINAL CLINICAL IMPRESSION(S) / ED DIAGNOSES  Final diagnoses:  Lower abdominal pain  Acute cystitis without hematuria      NEW MEDICATIONS STARTED DURING THIS VISIT:  New Prescriptions   CEPHALEXIN (KEFLEX) 500 MG CAPSULE    Take 1 capsule (500 mg total) by mouth 3 (three) times daily.     Note:  This document was prepared using Dragon voice recognition software and may include unintentional dictation errors.    Rudene Re, MD 11/08/15 [phone]

## 2015-11-09 LAB — URINE CULTURE

## 2015-11-23 DIAGNOSIS — F0391 Unspecified dementia with behavioral disturbance: Secondary | ICD-10-CM | POA: Insufficient documentation

## 2015-11-23 DIAGNOSIS — R451 Restlessness and agitation: Secondary | ICD-10-CM | POA: Insufficient documentation

## 2015-11-23 DIAGNOSIS — F03B18 Unspecified dementia, moderate, with other behavioral disturbance: Secondary | ICD-10-CM | POA: Insufficient documentation

## 2015-12-08 DIAGNOSIS — F0390 Unspecified dementia without behavioral disturbance: Secondary | ICD-10-CM | POA: Diagnosis not present

## 2015-12-09 DIAGNOSIS — F0390 Unspecified dementia without behavioral disturbance: Secondary | ICD-10-CM | POA: Diagnosis not present

## 2015-12-10 DIAGNOSIS — F0390 Unspecified dementia without behavioral disturbance: Secondary | ICD-10-CM | POA: Diagnosis not present

## 2015-12-11 DIAGNOSIS — F0390 Unspecified dementia without behavioral disturbance: Secondary | ICD-10-CM | POA: Diagnosis not present

## 2015-12-13 DIAGNOSIS — F0390 Unspecified dementia without behavioral disturbance: Secondary | ICD-10-CM | POA: Diagnosis not present

## 2015-12-14 DIAGNOSIS — F0390 Unspecified dementia without behavioral disturbance: Secondary | ICD-10-CM | POA: Diagnosis not present

## 2015-12-15 DIAGNOSIS — F0390 Unspecified dementia without behavioral disturbance: Secondary | ICD-10-CM | POA: Diagnosis not present

## 2015-12-16 DIAGNOSIS — F0390 Unspecified dementia without behavioral disturbance: Secondary | ICD-10-CM | POA: Diagnosis not present

## 2015-12-17 DIAGNOSIS — F0390 Unspecified dementia without behavioral disturbance: Secondary | ICD-10-CM | POA: Diagnosis not present

## 2015-12-18 DIAGNOSIS — F0390 Unspecified dementia without behavioral disturbance: Secondary | ICD-10-CM | POA: Diagnosis not present

## 2015-12-20 DIAGNOSIS — F0390 Unspecified dementia without behavioral disturbance: Secondary | ICD-10-CM | POA: Diagnosis not present

## 2015-12-21 DIAGNOSIS — F0390 Unspecified dementia without behavioral disturbance: Secondary | ICD-10-CM | POA: Diagnosis not present

## 2015-12-22 DIAGNOSIS — F0390 Unspecified dementia without behavioral disturbance: Secondary | ICD-10-CM | POA: Diagnosis not present

## 2015-12-23 DIAGNOSIS — F0390 Unspecified dementia without behavioral disturbance: Secondary | ICD-10-CM | POA: Diagnosis not present

## 2015-12-24 DIAGNOSIS — F0390 Unspecified dementia without behavioral disturbance: Secondary | ICD-10-CM | POA: Diagnosis not present

## 2015-12-25 DIAGNOSIS — F0390 Unspecified dementia without behavioral disturbance: Secondary | ICD-10-CM | POA: Diagnosis not present

## 2015-12-27 ENCOUNTER — Encounter: Payer: Self-pay | Admitting: Urology

## 2015-12-27 ENCOUNTER — Ambulatory Visit (INDEPENDENT_AMBULATORY_CARE_PROVIDER_SITE_OTHER): Payer: Commercial Managed Care - HMO | Admitting: Urology

## 2015-12-27 VITALS — BP 114/74 | HR 75 | Ht 63.0 in | Wt 134.8 lb

## 2015-12-27 DIAGNOSIS — N390 Urinary tract infection, site not specified: Secondary | ICD-10-CM | POA: Diagnosis not present

## 2015-12-27 DIAGNOSIS — N952 Postmenopausal atrophic vaginitis: Secondary | ICD-10-CM | POA: Diagnosis not present

## 2015-12-27 DIAGNOSIS — R351 Nocturia: Secondary | ICD-10-CM | POA: Diagnosis not present

## 2015-12-27 DIAGNOSIS — N898 Other specified noninflammatory disorders of vagina: Secondary | ICD-10-CM | POA: Diagnosis not present

## 2015-12-27 DIAGNOSIS — N200 Calculus of kidney: Secondary | ICD-10-CM | POA: Diagnosis not present

## 2015-12-27 DIAGNOSIS — F0390 Unspecified dementia without behavioral disturbance: Secondary | ICD-10-CM | POA: Diagnosis not present

## 2015-12-27 LAB — BLADDER SCAN AMB NON-IMAGING: Scan Result: 38

## 2015-12-27 NOTE — Progress Notes (Signed)
12/27/2015 10:36 AM   Jamie Boyd July 24, 1950 [id number]  Referring provider: Sharyne Peach, MD [place], [place] [address]  Chief Complaint  Patient presents with  . New Patient (Initial Visit)    Flank pain    HPI: Patient is a 65 -year-old [place] American female who is referred to [place] by, Dr. Iona Beard, for recurrent urinary tract infections.  She presents with her sister-in-law, Lattie Haw, who is also her caregiver.    Patient states that she has had 20, if not 30 urinary tract infections over the last year.  Her symptoms with a urinary tract infection consist of low back pain, suprapubic pain, yellow in her pad and episodes of gross hematuria.  She denies dysuria, abdominal pain and flank pain.   She has had any recent fevers and chills, but no nausea or vomiting.   She does not have a history of nephrolithiasis, GU surgery or GU trauma.   Reviewing her records,  she has had no documented UTI's .   She has had several cultures with 1,000-10,000 gram negative rods.    She is not sexually active.  She is post menopausal.   She admits to constipation.    She does engage in good perineal hygiene. She does take tub baths.   She does have incontinence.  She is using incontinence pads, 2 to 3 depends daily.  Sometimes, they are soaked.  She is not having pain with bladder filling.    She has a CT Renal stone study performed on 11/08/2015 which noted left lower pole nonobstructing 11 mm stone. Extra renal type pelvis bilaterally without obstructing renal or ureteral calculi noted.  No extra luminal bowel inflammatory process, free fluid or free air.  Aortic atherosclerosis.  Scoliosis lumbar spine with degenerative changes and various degrees spinal stenosis and foraminal narrowing most prominent L4-5 level.  I have independently reviewed the films.   She is drinking 5 to 8 bottles of water daily.   Her caregiver states that she gets an infection when she drinks  cranberry juice.    PMH: Past Medical History:  Diagnosis Date  . Anemia   . Dementia   . Memory deficit     Surgical History: Past Surgical History:  Procedure Laterality Date  . COLONOSCOPY WITH PROPOFOL N/A 02/18/2015   Procedure: COLONOSCOPY WITH PROPOFOL;  Surgeon: Hulen Luster, MD;  Location: [facility] ENDOSCOPY;  Service: Gastroenterology;  Laterality: N/A;  . ESOPHAGOGASTRODUODENOSCOPY N/A 02/18/2015   Procedure: ESOPHAGOGASTRODUODENOSCOPY (EGD);  Surgeon: Hulen Luster, MD;  Location: [facility] ENDOSCOPY;  Service: Gastroenterology;  Laterality: N/A;    Home Medications:    Medication List       Accurate as of 12/27/15 10:36 AM. Always use your most recent med list.          AIRGO ROLLING WALKER Misc 1 Units by Does not apply route as directed.   CENTRUM SILVER ULTRA WOMENS PO Take 1 tablet by mouth daily.   divalproex 250 MG DR tablet Commonly known as:  DEPAKOTE Take by mouth.   docusate sodium 100 MG capsule Commonly known as:  COLACE Take 1 capsule by mouth 2 (two) times daily as needed.   donepezil 5 MG tablet Commonly known as:  ARICEPT Take 5 mg by mouth at bedtime.   ENTRUST PLUS BRIEFS MEDIUM Misc Use 100 each 4 (four) times daily.   esomeprazole 40 MG capsule Commonly known as:  NEXIUM Take 1 capsule (40 mg total) by mouth daily.  famotidine 20 MG tablet Commonly known as:  PEPCID Take 1 tablet (20 mg total) by mouth 2 (two) times daily as needed for heartburn or indigestion.   ferrous sulfate 325 (65 FE) MG tablet Take by mouth.   fexofenadine 180 MG tablet Commonly known as:  ALLEGRA Take by mouth.   isosorbide mononitrate 30 MG 24 hr tablet Commonly known as:  IMDUR Take by mouth.   memantine 10 MG tablet Commonly known as:  NAMENDA Take by mouth.   metoprolol succinate 25 MG 24 hr tablet Commonly known as:  TOPROL-XL Take by mouth.   montelukast 10 MG tablet Commonly known as:  SINGULAIR Take by mouth.   phenazopyridine 200 MG  tablet Commonly known as:  PYRIDIUM Take by mouth.   QUEtiapine 100 MG tablet Commonly known as:  SEROQUEL Take 1.5 tabs at night   RA LATEX MEDICAL GLOVES Misc Use 1 Box 3 (three) times daily as needed.   sucralfate 1 g tablet Commonly known as:  CARAFATE Take by mouth.   traZODone 50 MG tablet Commonly known as:  DESYREL Take 150 mg by mouth at bedtime.       Allergies: No Known Allergies  Family History: Family History  Problem Relation Age of Onset  . Prostate cancer Neg Hx   . Bladder Cancer Neg Hx   . Kidney cancer Neg Hx     Social History:  reports that she has quit smoking. She has never used smokeless tobacco. She reports that she does not drink alcohol or use drugs.  ROS: UROLOGY Frequent Urination?: Yes Hard to postpone urination?: No Burning/pain with urination?: Yes Get up at night to urinate?: Yes Leakage of urine?: No Urine stream starts and stops?: No Trouble starting stream?: Yes Do you have to strain to urinate?: No Blood in urine?: Yes Urinary tract infection?: Yes Sexually transmitted disease?: No Injury to kidneys or bladder?: No Painful intercourse?: No Weak stream?: Yes Currently pregnant?: No Vaginal bleeding?: No Last menstrual period?: n  Gastrointestinal Nausea?: No Vomiting?: No Indigestion/heartburn?: No Diarrhea?: No Constipation?: No  Constitutional Fever: No Night sweats?: No Weight loss?: No Fatigue?: No  Skin Skin rash/lesions?: No Itching?: No  Eyes Blurred vision?: No Double vision?: No  Ears/Nose/Throat Sore throat?: No Sinus problems?: Yes  Hematologic/Lymphatic Swollen glands?: No Easy bruising?: No  Cardiovascular Leg swelling?: No Chest pain?: No  Respiratory Cough?: No Shortness of breath?: No  Endocrine Excessive thirst?: No  Musculoskeletal Back pain?: Yes Joint pain?: No  Neurological Headaches?: No Dizziness?: No  Psychologic Depression?: No Anxiety?: No  Physical  Exam: BP 114/74 (BP Location: Right Arm, Patient Position: Sitting, Cuff Size: Normal)   Pulse 75   Ht 5\' 3"  (1.6 m)   Wt 134 lb 12.8 oz (61.1 kg)   BMI 23.88 kg/m   Constitutional: Well nourished. Alert and oriented, No acute distress. HEENT: [place] AT, moist mucus membranes. Trachea midline, no masses. Cardiovascular: No clubbing, cyanosis, or edema. Respiratory: Normal respiratory effort, no increased work of breathing. GI: Abdomen is soft, non tender, non distended, no abdominal masses. Liver and spleen not palpable.  No hernias appreciated.  Stool sample for occult testing is not indicated.   GU: No CVA tenderness.  No bladder fullness or masses.  Atrophic external genitalia, normal pubic hair distribution, no lesions.  Normal urethral meatus, no lesions, no prolapse, no discharge.   No urethral masses, tenderness and/or tenderness. No bladder fullness, tenderness or masses. Pale vagina mucosa, poor estrogen effect, red, cloudy green  discharge seen in the vagina, no lesions, good pelvic support, no cystocele or rectocele noted.  Uterus is surgically absent.   No adnexal/parametria masses or tenderness noted.  Anus and perineum are without rashes or lesions.    Skin: No rashes, bruises or suspicious lesions. Lymph: No cervical or inguinal adenopathy. Neurologic: Grossly intact, no focal deficits, moving all 4 extremities. Psychiatric: Normal mood and affect.  Laboratory Data: Lab Results  Component Value Date   WBC 9.2 11/08/2015   HGB 12.2 11/08/2015   HCT 35.3 11/08/2015   MCV 92.5 11/08/2015   PLT 255 11/08/2015    Lab Results  Component Value Date   CREATININE 0.75 11/08/2015    Lab Results  Component Value Date   AST 28 11/08/2015   Lab Results  Component Value Date   ALT 19 11/08/2015      Pertinent Imaging: CLINICAL DATA:  65 year old female with left lower quadrant pain for 2 days. Initial encounter.  EXAM: CT ABDOMEN AND PELVIS WITHOUT  CONTRAST  TECHNIQUE: Multidetector CT imaging of the abdomen and pelvis was performed following the standard protocol without IV contrast.  COMPARISON:  01/27/2015 CT.  FINDINGS: Lower chest: Minimal scarring lung bases.  Hepatobiliary: Taking into account limitation by non contrast imaging, no worrisome hepatic lesion. No calcified gallstones.  Pancreas: Taking into account limitation by non contrast imaging, no worrisome lesion or inflammation. Common bile duct and pancreatic duct mild prominence similar to prior exam.  Spleen: Taking into account limitation by non contrast imaging, no focal lesion or enlargement.  Adrenals/Urinary Tract: Left lower pole 11 mm nonobstructing stone. Extra renal type pelvis bilaterally. No obstructing ureteral or renal calculus. Taking into account limitation by non contrast imaging, no renal or adrenal lesion.  Stomach/Bowel: No extra luminal bowel inflammatory process, free fluid or free air.  Vascular/Lymphatic: Calcified plaque a order which is ectatic without focal aneurysmal dilation. Plaque iliac arteries. No adenopathy.  Reproductive: No worrisome abnormality.  Other: Negative.  Musculoskeletal: Scoliosis lumbar spine with degenerative changes and various degrees of spinal stenosis and foraminal narrowing, most prominent L4-5 level.  IMPRESSION: Left lower pole nonobstructing 11 mm stone. Extra renal type pelvis bilaterally without obstructing renal or ureteral calculi noted.  No extra luminal bowel inflammatory process, free fluid or free air.  Aortic atherosclerosis.  Scoliosis lumbar spine with degenerative changes and various degrees spinal stenosis and foraminal narrowing most prominent L4-5 level.   Electronically Signed   By: Genia Del M.D.   On: 11/08/2015 12:00  Results for ILAYNA, SPATH (MRN [id number]) as of 12/27/2015 10:13  Ref. Range 12/27/2015 09:36  Scan Result Unknown 38     Assessment & Plan:    1. Recurrent UTI's  - patient did not have any documented UTI's  - reviewed UTI prevention strategies  - symptoms may be coming from a gynecological etiology as she had a red, green discharge from the vagina  - Bladder Scan (Post Void Residual) in office  2. Vaginal atrophy  - will consider vaginal estrogen cream once she has been evaluated by gynecology for possible infection  - will refer to gynecology to evaluate the vaginal discharge  3. Foul smelling vaginal discharge  - refer to gynecology  4. Left renal stone  - present since 2016  - not problematic at this time  - continue to monitor renal function  Return for follow up after referral to gynecology.  These notes generated with voice recognition software. I apologize for typographical errors.  Greenleigh Kauth,  PA-C  [place] [address] [place], [place] [address] [phone]

## 2015-12-28 DIAGNOSIS — F0390 Unspecified dementia without behavioral disturbance: Secondary | ICD-10-CM | POA: Diagnosis not present

## 2015-12-29 DIAGNOSIS — F0390 Unspecified dementia without behavioral disturbance: Secondary | ICD-10-CM | POA: Diagnosis not present

## 2015-12-30 DIAGNOSIS — F0390 Unspecified dementia without behavioral disturbance: Secondary | ICD-10-CM | POA: Diagnosis not present

## 2015-12-31 DIAGNOSIS — F0390 Unspecified dementia without behavioral disturbance: Secondary | ICD-10-CM | POA: Diagnosis not present

## 2016-01-01 DIAGNOSIS — F0390 Unspecified dementia without behavioral disturbance: Secondary | ICD-10-CM | POA: Diagnosis not present

## 2016-01-03 DIAGNOSIS — F0390 Unspecified dementia without behavioral disturbance: Secondary | ICD-10-CM | POA: Diagnosis not present

## 2016-01-04 DIAGNOSIS — F0390 Unspecified dementia without behavioral disturbance: Secondary | ICD-10-CM | POA: Diagnosis not present

## 2016-01-05 DIAGNOSIS — F0390 Unspecified dementia without behavioral disturbance: Secondary | ICD-10-CM | POA: Diagnosis not present

## 2016-01-06 DIAGNOSIS — F0390 Unspecified dementia without behavioral disturbance: Secondary | ICD-10-CM | POA: Diagnosis not present

## 2016-01-07 DIAGNOSIS — F0390 Unspecified dementia without behavioral disturbance: Secondary | ICD-10-CM | POA: Diagnosis not present

## 2016-01-08 DIAGNOSIS — F0390 Unspecified dementia without behavioral disturbance: Secondary | ICD-10-CM | POA: Diagnosis not present

## 2016-01-10 DIAGNOSIS — F0390 Unspecified dementia without behavioral disturbance: Secondary | ICD-10-CM | POA: Diagnosis not present

## 2016-01-11 DIAGNOSIS — F0390 Unspecified dementia without behavioral disturbance: Secondary | ICD-10-CM | POA: Diagnosis not present

## 2016-01-12 DIAGNOSIS — F0390 Unspecified dementia without behavioral disturbance: Secondary | ICD-10-CM | POA: Diagnosis not present

## 2016-01-13 DIAGNOSIS — F0390 Unspecified dementia without behavioral disturbance: Secondary | ICD-10-CM | POA: Diagnosis not present

## 2016-01-14 DIAGNOSIS — F0390 Unspecified dementia without behavioral disturbance: Secondary | ICD-10-CM | POA: Diagnosis not present

## 2016-01-17 DIAGNOSIS — F0390 Unspecified dementia without behavioral disturbance: Secondary | ICD-10-CM | POA: Diagnosis not present

## 2016-01-18 DIAGNOSIS — F0390 Unspecified dementia without behavioral disturbance: Secondary | ICD-10-CM | POA: Diagnosis not present

## 2016-01-19 DIAGNOSIS — F0390 Unspecified dementia without behavioral disturbance: Secondary | ICD-10-CM | POA: Diagnosis not present

## 2016-01-19 DIAGNOSIS — N3941 Urge incontinence: Secondary | ICD-10-CM | POA: Diagnosis not present

## 2016-01-20 DIAGNOSIS — F0390 Unspecified dementia without behavioral disturbance: Secondary | ICD-10-CM | POA: Diagnosis not present

## 2016-01-21 DIAGNOSIS — F0390 Unspecified dementia without behavioral disturbance: Secondary | ICD-10-CM | POA: Diagnosis not present

## 2016-01-22 DIAGNOSIS — F0390 Unspecified dementia without behavioral disturbance: Secondary | ICD-10-CM | POA: Diagnosis not present

## 2016-01-24 DIAGNOSIS — F0390 Unspecified dementia without behavioral disturbance: Secondary | ICD-10-CM | POA: Diagnosis not present

## 2016-01-24 DIAGNOSIS — R451 Restlessness and agitation: Secondary | ICD-10-CM | POA: Diagnosis not present

## 2016-01-24 DIAGNOSIS — G479 Sleep disorder, unspecified: Secondary | ICD-10-CM | POA: Diagnosis not present

## 2016-01-25 DIAGNOSIS — F0390 Unspecified dementia without behavioral disturbance: Secondary | ICD-10-CM | POA: Diagnosis not present

## 2016-01-26 DIAGNOSIS — B354 Tinea corporis: Secondary | ICD-10-CM | POA: Diagnosis not present

## 2016-01-26 DIAGNOSIS — F0390 Unspecified dementia without behavioral disturbance: Secondary | ICD-10-CM | POA: Diagnosis not present

## 2016-01-27 DIAGNOSIS — F0390 Unspecified dementia without behavioral disturbance: Secondary | ICD-10-CM | POA: Diagnosis not present

## 2016-01-28 DIAGNOSIS — F0390 Unspecified dementia without behavioral disturbance: Secondary | ICD-10-CM | POA: Diagnosis not present

## 2016-01-29 DIAGNOSIS — F0390 Unspecified dementia without behavioral disturbance: Secondary | ICD-10-CM | POA: Diagnosis not present

## 2016-02-01 DIAGNOSIS — F0390 Unspecified dementia without behavioral disturbance: Secondary | ICD-10-CM | POA: Diagnosis not present

## 2016-02-02 DIAGNOSIS — F0390 Unspecified dementia without behavioral disturbance: Secondary | ICD-10-CM | POA: Diagnosis not present

## 2016-02-03 DIAGNOSIS — F0390 Unspecified dementia without behavioral disturbance: Secondary | ICD-10-CM | POA: Diagnosis not present

## 2016-02-03 DIAGNOSIS — L259 Unspecified contact dermatitis, unspecified cause: Secondary | ICD-10-CM | POA: Diagnosis not present

## 2016-02-04 ENCOUNTER — Emergency Department
Admission: EM | Admit: 2016-02-04 | Discharge: 2016-02-05 | Disposition: A | Discharge status: Home / Self Care | Payer: Medicare HMO | Attending: Emergency Medicine | Admitting: Emergency Medicine

## 2016-02-04 DIAGNOSIS — Z87891 Personal history of nicotine dependence: Secondary | ICD-10-CM | POA: Diagnosis not present

## 2016-02-04 DIAGNOSIS — F0391 Unspecified dementia with behavioral disturbance: Secondary | ICD-10-CM | POA: Diagnosis not present

## 2016-02-04 DIAGNOSIS — R4689 Other symptoms and signs involving appearance and behavior: Secondary | ICD-10-CM

## 2016-02-04 DIAGNOSIS — Z79899 Other long term (current) drug therapy: Secondary | ICD-10-CM | POA: Diagnosis not present

## 2016-02-04 DIAGNOSIS — R4182 Altered mental status, unspecified: Secondary | ICD-10-CM | POA: Diagnosis present

## 2016-02-04 DIAGNOSIS — F0390 Unspecified dementia without behavioral disturbance: Secondary | ICD-10-CM | POA: Diagnosis not present

## 2016-02-04 LAB — CBC
HEMATOCRIT: 34.4 % — AB (ref 35.0–47.0)
Hemoglobin: 11.8 g/dL — ABNORMAL LOW (ref 12.0–16.0)
MCH: 31.7 pg (ref 26.0–34.0)
MCHC: 34.4 g/dL (ref 32.0–36.0)
MCV: 92.4 fL (ref 80.0–100.0)
PLATELETS: 266 10*3/uL (ref 150–440)
RBC: 3.72 MIL/uL — ABNORMAL LOW (ref 3.80–5.20)
RDW: 13.5 % (ref 11.5–14.5)
WBC: 9.8 10*3/uL (ref 3.6–11.0)

## 2016-02-04 LAB — BASIC METABOLIC PANEL
Anion gap: 5 (ref 5–15)
BUN: 21 mg/dL — ABNORMAL HIGH (ref 6–20)
CALCIUM: 9.8 mg/dL (ref 8.9–10.3)
CO2: 30 mmol/L (ref 22–32)
CREATININE: 0.83 mg/dL (ref 0.44–1.00)
Chloride: 108 mmol/L (ref 101–111)
GFR calc Af Amer: >60 mL/min (ref >60)
GLUCOSE: 93 mg/dL (ref 65–99)
Potassium: 3.7 mmol/L (ref 3.5–5.1)
Sodium: 143 mmol/L (ref 135–145)

## 2016-02-04 LAB — URINE DRUG SCREEN, QUALITATIVE (ARMC ONLY)
AMPHETAMINES, UR SCREEN: NOT DETECTED
Barbiturates, Ur Screen: NOT DETECTED
Benzodiazepine, Ur Scrn: NOT DETECTED
CANNABINOID 50 NG, UR ~~LOC~~: NOT DETECTED
Cocaine Metabolite,Ur ~~LOC~~: NOT DETECTED
MDMA (ECSTASY) UR SCREEN: NOT DETECTED
METHADONE SCREEN, URINE: NOT DETECTED
Opiate, Ur Screen: NOT DETECTED
Phencyclidine (PCP) Ur S: NOT DETECTED
TRICYCLIC, UR SCREEN: NOT DETECTED

## 2016-02-04 LAB — URINALYSIS, COMPLETE (UACMP) WITH MICROSCOPIC
BACTERIA UA: NONE SEEN
Bilirubin Urine: NEGATIVE
Glucose, UA: NEGATIVE mg/dL
Hgb urine dipstick: NEGATIVE
Ketones, ur: NEGATIVE mg/dL
Nitrite: NEGATIVE
PROTEIN: NEGATIVE mg/dL
Specific Gravity, Urine: 1.012 (ref 1.005–1.030)
pH: 6 (ref 5.0–8.0)

## 2016-02-04 LAB — ETHANOL: Alcohol, Ethyl (B): <5 mg/dL (ref <5)

## 2016-02-04 NOTE — ED Notes (Signed)
Brother contacted, Meriel Flavors [phone] he is on his way from [place].
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
## 2016-02-04 NOTE — ED Notes (Signed)
Pt. Reports dysuria for the past week.  Pt. States also seeing blood in urine.  Pt. Reports hx of kidney infection and UTI in the past.  Family confirms UTI hx.
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
## 2016-02-05 DIAGNOSIS — F0390 Unspecified dementia without behavioral disturbance: Secondary | ICD-10-CM | POA: Diagnosis not present

## 2016-02-05 NOTE — BH Assessment (Signed)
Assessment Note  Jamie Boyd is an 65 y.o. female, with a history of dementia,  presenting to the ED via the police department after becoming aggressive towards her niece.  According to patient's brother, patient was aggressive with the niece tonight at the residence. Patient's family report pt is becoming increasingly aggressive.  Patient has no recollection of being aggressive towards her niece.  She thinks she is here for some lower abdominal pain.  Patient has no previous history of psychiatric hospitalizations or mental health treatment.  Diagnosis: Dementia  Past Medical History:  Past Medical History:  Diagnosis Date  . Anemia   . Dementia   . Memory deficit     Past Surgical History:  Procedure Laterality Date  . COLONOSCOPY WITH PROPOFOL N/A 02/18/2015   Procedure: COLONOSCOPY WITH PROPOFOL;  Surgeon: Hulen Luster, MD;  Location: [facility] ENDOSCOPY;  Service: Gastroenterology;  Laterality: N/A;  . ESOPHAGOGASTRODUODENOSCOPY N/A 02/18/2015   Procedure: ESOPHAGOGASTRODUODENOSCOPY (EGD);  Surgeon: Hulen Luster, MD;  Location: [facility] ENDOSCOPY;  Service: Gastroenterology;  Laterality: N/A;    Family History:  Family History  Problem Relation Age of Onset  . Prostate cancer Neg Hx   . Bladder Cancer Neg Hx   . Kidney cancer Neg Hx     Social History:  reports that she has quit smoking. She has never used smokeless tobacco. She reports that she does not drink alcohol or use drugs.  Additional Social History:  Alcohol / Drug Use Pain Medications: See PTA Prescriptions: See PTA Over the Counter: See PTA History of alcohol / drug use?: No history of alcohol / drug abuse  CIWA: CIWA-Ar BP: 114/65 Pulse Rate: (!) 57 COWS:    Allergies: No Known Allergies  Home Medications:  (Not in a hospital admission)  OB/GYN Status:  No LMP recorded. Patient is postmenopausal.  General Assessment Data Location of Assessment: [facility] ED TTS Assessment: In system Is this a Tele or Face-to-Face  Assessment?: Face-to-Face Is this an Initial Assessment or a Re-assessment for this encounter?: Initial Assessment Marital status: Single Maiden name: n/a Is patient pregnant?: No Pregnancy Status: No Living Arrangements: Other relatives Can pt return to current living arrangement?: Yes Admission Status: Voluntary Is patient capable of signing voluntary admission?: No Referral Source: Self/Family/Friend Insurance type: Medicare Advantage  Medical Screening Exam ([place]) Medical Exam completed: Yes  Crisis Care Plan Living Arrangements: Other relatives Legal Guardian: Other: (self) Name of Psychiatrist: none reported Name of Therapist: none reported  Education Status Is patient currently in school?: No Current Grade: na Highest grade of school patient has completed: na Name of school: na Contact person: na  Risk to self with the past 6 months Suicidal Ideation: No Has patient been a risk to self within the past 6 months prior to admission? : No Suicidal Intent: No Has patient had any suicidal intent within the past 6 months prior to admission? : No Is patient at risk for suicide?: No Suicidal Plan?: No Has patient had any suicidal plan within the past 6 months prior to admission? : No Access to Means: No What has been your use of drugs/alcohol within the last 12 months?: none reported by patient's family Previous Attempts/Gestures: No How many times?: 0 Other Self Harm Risks: none reported Intentional Self Injurious Behavior: None Family Suicide History: No Recent stressful life event(s): Recent negative physical changes Persecutory voices/beliefs?: No Depression: No Substance abuse history and/or treatment for substance abuse?: No Suicide prevention information given to non-admitted patients: Not applicable  Risk to Others within the past 6 months Homicidal Ideation: No Does patient have any lifetime risk of violence toward others beyond the six months  prior to admission? : No Thoughts of Harm to Others: No Current Homicidal Intent: No Current Homicidal Plan: No Access to Homicidal Means: No Identified Victim: none identified History of harm to others?: No Assessment of Violence: None Noted Violent Behavior Description: None noted Does patient have access to weapons?: No Criminal Charges Pending?: No Does patient have a court date: No Is patient on probation?: No  Psychosis Hallucinations: None noted Delusions: Unspecified  Mental Status Report Appearance/Hygiene: In scrubs Eye Contact: Good Motor Activity: Freedom of movement Speech: Logical/coherent Level of Consciousness: Alert Mood: Pleasant Affect: Appropriate to circumstance Anxiety Level: Minimal Thought Processes: Circumstantial Judgement: Partial Orientation: Person, Place Obsessive Compulsive Thoughts/Behaviors: None  Cognitive Functioning Concentration: Good Memory: Recent Impaired IQ: Average Insight: Good Impulse Control: Good Appetite: Good Weight Loss: 0 Weight Gain: 0 Sleep: No Change Vegetative Symptoms: None  ADLScreening [facility] Assessment Services) Patient's cognitive ability adequate to safely complete daily activities?: Yes Patient able to express need for assistance with ADLs?: Yes Independently performs ADLs?: Yes (appropriate for developmental age)  Prior Inpatient Therapy Prior Inpatient Therapy: No Prior Therapy Dates: na Prior Therapy Facilty/Provider(s): na Reason for Treatment: na  Prior Outpatient Therapy Prior Outpatient Therapy: No Prior Therapy Dates: na Prior Therapy Facilty/Provider(s): na Reason for Treatment: na Does patient have an ACCT team?: No Does patient have Intensive In-House Services?  : No Does patient have [facility] services? : No Does patient have P4CC services?: No  ADL Screening (condition at time of admission) Patient's cognitive ability adequate to safely complete daily activities?: Yes Patient able  to express need for assistance with ADLs?: Yes Independently performs ADLs?: Yes (appropriate for developmental age)       Abuse/Neglect Assessment (Assessment to be complete while patient is alone) Physical Abuse: Denies Verbal Abuse: Denies Sexual Abuse: Denies Exploitation of patient/patient's resources: Denies Self-Neglect: Denies Values / Beliefs Cultural Requests During Hospitalization: None Spiritual Requests During Hospitalization: None Consults Spiritual Care Consult Needed: No Social Work Consult Needed: No      Additional Information 1:1 In Past 12 Months?: No CIRT Risk: No Elopement Risk: No Does patient have medical clearance?: Yes     Disposition:  Disposition Initial Assessment Completed for this Encounter: Yes Disposition of Patient: Other dispositions Other disposition(s): Other (Comment) (Pending [facility] consult)  On Site Evaluation by:   Reviewed with Physician:    Donnalyn Juran C Ruthie Berch 02/05/2016 12:03 AM

## 2016-02-05 NOTE — ED Notes (Signed)
Pt. Going home with brother.

## 2016-02-05 NOTE — ED Notes (Signed)
SOC called back to report that patient and family were in agreement to go home in the morning.  Pt. Moved into room 21B.

## 2016-02-05 NOTE — ED Provider Notes (Signed)
-----------------------------------------   7:14 AM on 02/05/2016 -----------------------------------------   Blood pressure 93/60, pulse 62, temperature 98.3 F (36.8 C), temperature source Oral, resp. rate 18, weight 135 lb (61.2 kg), SpO2 99 %.  Assuming care from Dr. Kerman Passey .  In short, Jamie Boyd is a 65 y.o. female with a chief complaint of Altered Mental Status .  Refer to the original H&P for additional details.  The current plan of care is to follow up the recommendations of the [facility].  The specialist on-call does not feel that the patient meets criteria for inpatient admission. He feels that her behavioral disturbances all due to her dementia. He feels that the patient needs to be followed back up with her doctor to help stabilize her medications. Otherwise the patient be discharged to home. The patient stayed overnight and had no problems at her family was here in the morning to pick her up and take her home.    Loney Hering, MD 02/05/16 [phone]

## 2016-02-05 NOTE — ED Notes (Signed)
Pt. Moved to Room #25 for [facility].  Machine set up and ready.

## 2016-02-05 NOTE — Discharge Instructions (Signed)
Please follow-up with your primary care physician for further evaluations on ways to manage your dementia.

## 2016-02-07 DIAGNOSIS — F0390 Unspecified dementia without behavioral disturbance: Secondary | ICD-10-CM | POA: Diagnosis not present

## 2016-02-08 DIAGNOSIS — F0390 Unspecified dementia without behavioral disturbance: Secondary | ICD-10-CM | POA: Diagnosis not present

## 2016-02-09 DIAGNOSIS — F0390 Unspecified dementia without behavioral disturbance: Secondary | ICD-10-CM | POA: Diagnosis not present

## 2016-02-10 DIAGNOSIS — F0390 Unspecified dementia without behavioral disturbance: Secondary | ICD-10-CM | POA: Diagnosis not present

## 2016-02-11 DIAGNOSIS — F0390 Unspecified dementia without behavioral disturbance: Secondary | ICD-10-CM | POA: Diagnosis not present

## 2016-02-12 DIAGNOSIS — F0390 Unspecified dementia without behavioral disturbance: Secondary | ICD-10-CM | POA: Diagnosis not present

## 2016-02-14 ENCOUNTER — Emergency Department
Admission: EM | Admit: 2016-02-14 | Discharge: 2016-02-14 | Disposition: A | Discharge status: Home / Self Care | Payer: Medicare HMO | Attending: Emergency Medicine | Admitting: Emergency Medicine

## 2016-02-14 ENCOUNTER — Encounter: Payer: Self-pay | Admitting: Emergency Medicine

## 2016-02-14 DIAGNOSIS — Z87891 Personal history of nicotine dependence: Secondary | ICD-10-CM | POA: Insufficient documentation

## 2016-02-14 DIAGNOSIS — Z79899 Other long term (current) drug therapy: Secondary | ICD-10-CM | POA: Diagnosis not present

## 2016-02-14 DIAGNOSIS — R319 Hematuria, unspecified: Secondary | ICD-10-CM | POA: Diagnosis present

## 2016-02-14 DIAGNOSIS — F0390 Unspecified dementia without behavioral disturbance: Secondary | ICD-10-CM | POA: Diagnosis not present

## 2016-02-14 DIAGNOSIS — N390 Urinary tract infection, site not specified: Secondary | ICD-10-CM | POA: Diagnosis not present

## 2016-02-14 LAB — CBC
HEMATOCRIT: 33 % — AB (ref 35.0–47.0)
Hemoglobin: 11.3 g/dL — ABNORMAL LOW (ref 12.0–16.0)
MCH: 31.4 pg (ref 26.0–34.0)
MCHC: 34.2 g/dL (ref 32.0–36.0)
MCV: 92 fL (ref 80.0–100.0)
Platelets: 287 10*3/uL (ref 150–440)
RBC: 3.59 MIL/uL — ABNORMAL LOW (ref 3.80–5.20)
RDW: 13.1 % (ref 11.5–14.5)
WBC: 11.8 10*3/uL — ABNORMAL HIGH (ref 3.6–11.0)

## 2016-02-14 LAB — BASIC METABOLIC PANEL
Anion gap: 6 (ref 5–15)
BUN: 14 mg/dL (ref 6–20)
CHLORIDE: 105 mmol/L (ref 101–111)
CO2: 28 mmol/L (ref 22–32)
CREATININE: 0.75 mg/dL (ref 0.44–1.00)
Calcium: 9.4 mg/dL (ref 8.9–10.3)
GFR calc Af Amer: >60 mL/min (ref >60)
GLUCOSE: 80 mg/dL (ref 65–99)
POTASSIUM: 4.1 mmol/L (ref 3.5–5.1)
Sodium: 139 mmol/L (ref 135–145)

## 2016-02-14 LAB — URINALYSIS, COMPLETE (UACMP) WITH MICROSCOPIC
BILIRUBIN URINE: NEGATIVE
GLUCOSE, UA: NEGATIVE mg/dL
HGB URINE DIPSTICK: NEGATIVE
Ketones, ur: 5 mg/dL — AB
Nitrite: POSITIVE — AB
PROTEIN: NEGATIVE mg/dL
Specific Gravity, Urine: 1.009 (ref 1.005–1.030)
pH: 6 (ref 5.0–8.0)

## 2016-02-14 MED ORDER — CEPHALEXIN 500 MG PO CAPS
500.0000 mg | ORAL_CAPSULE | Freq: Once | ORAL | Status: AC
  Administered 2016-02-14: 500 mg via ORAL
  Filled 2016-02-14 (×2): qty 1

## 2016-02-14 MED ORDER — CEPHALEXIN 500 MG PO CAPS: 500.0000 mg | ORAL_CAPSULE | Freq: Three times a day (TID) | ORAL | 0 refills | Status: DC

## 2016-02-14 NOTE — ED Notes (Signed)

## 2016-02-14 NOTE — ED Notes (Signed)
Pt unable to void at this time. Will try again later

## 2016-02-14 NOTE — ED Provider Notes (Signed)
[facility] Emergency Department Provider Note  Time seen: 7:01 PM  I have reviewed the triage vital signs and the nursing notes.   HISTORY  Chief Complaint Abdominal Pain and Hematuria    HPI Jamie Boyd is a 66 y.o. female With a past medical history of dementia who presents the emergency department for hematuria. According to the patient and her caregiver for the past 2 days the patient has been complaining of blood in urine with occasional right flank pain. Denies any abdominal pain at this time.Patient is not sure if it hurts to urinate. Denies any fever or nausea or vomiting. Denies any diarrhea.  Past Medical History:  Diagnosis Date  . Anemia   . Dementia   . Memory deficit     Patient Active Problem List   Diagnosis Date Noted  . Agitation 11/23/2015  . Moderate dementia, with behavioral disturbance 11/23/2015  . Gastroesophageal reflux disease without esophagitis 09/11/2015  . Iron deficiency anemia due to chronic blood loss 06/02/2015  . GERD without esophagitis 06/01/2015  . Difficulty sleeping 04/27/2015  . Moderate dementia without behavioral disturbance 03/01/2015  . Acute UTI 01/29/2015  . Anemia 01/29/2015  . Memory deficit 01/29/2015    Past Surgical History:  Procedure Laterality Date  . COLONOSCOPY WITH PROPOFOL N/A 02/18/2015   Procedure: COLONOSCOPY WITH PROPOFOL;  Surgeon: Hulen Luster, MD;  Location: [facility] ENDOSCOPY;  Service: Gastroenterology;  Laterality: N/A;  . ESOPHAGOGASTRODUODENOSCOPY N/A 02/18/2015   Procedure: ESOPHAGOGASTRODUODENOSCOPY (EGD);  Surgeon: Hulen Luster, MD;  Location: [facility] ENDOSCOPY;  Service: Gastroenterology;  Laterality: N/A;    Prior to Admission medications   Medication Sig Start Date End Date Taking? Authorizing Provider  Disposable Gloves (RA LATEX MEDICAL GLOVES) MISC Use 1 Box 3 (three) times daily as needed. 09/13/15   Historical Provider, MD  divalproex (DEPAKOTE) 250 MG DR tablet Take by mouth.  11/23/15   Historical Provider, MD  docusate sodium (COLACE) 100 MG capsule Take 1 capsule by mouth 2 (two) times daily as needed. 02/23/15 02/23/16  Historical Provider, MD  donepezil (ARICEPT) 5 MG tablet Take 5 mg by mouth at bedtime.    Historical Provider, MD  esomeprazole (NEXIUM) 40 MG capsule Take 1 capsule (40 mg total) by mouth daily. 02/24/15 02/24/16  Drenda Freeze, MD  famotidine (PEPCID) 20 MG tablet Take 1 tablet (20 mg total) by mouth 2 (two) times daily as needed for heartburn or indigestion. Patient not taking: Reported on 12/27/2015 02/24/15 02/24/16  Drenda Freeze, MD  ferrous sulfate 325 (65 FE) MG tablet Take by mouth. 02/23/15 02/23/16  Historical Provider, MD  fexofenadine (ALLEGRA) 180 MG tablet Take by mouth. 09/13/15 09/12/16  Historical Provider, MD  Incontinence Supply Disposable (ENTRUST PLUS BRIEFS MEDIUM) MISC Use 100 each 4 (four) times daily. 10/04/15   Historical Provider, MD  isosorbide mononitrate (IMDUR) 30 MG 24 hr tablet Take by mouth. 11/10/15 11/09/16  Historical Provider, MD  memantine (NAMENDA) 10 MG tablet Take by mouth. 11/23/15 11/22/16  Historical Provider, MD  metoprolol succinate (TOPROL-XL) 25 MG 24 hr tablet Take by mouth. 11/10/15 11/09/16  Historical Provider, MD  Misc. Devices (AIRGO ROLLING WALKER) MISC 1 Units by Does not apply route as directed. 06/21/15   Harvest Dark, MD  montelukast (SINGULAIR) 10 MG tablet Take by mouth. 08/05/15 08/04/16  Historical Provider, MD  Multiple Vitamins-Minerals (CENTRUM SILVER ULTRA WOMENS PO) Take 1 tablet by mouth daily.     Historical Provider, MD  phenazopyridine (PYRIDIUM) 200 MG  tablet Take by mouth.    Historical Provider, MD  QUEtiapine (SEROQUEL) 100 MG tablet Take 1.5 tabs at night 11/23/15   Historical Provider, MD  sucralfate (CARAFATE) 1 g tablet Take by mouth. 09/10/15 09/09/16  Historical Provider, MD  traZODone (DESYREL) 50 MG tablet Take 150 mg by mouth at bedtime.     Historical Provider, MD    No  Known Allergies  Family History  Problem Relation Age of Onset  . Prostate cancer Neg Hx   . Bladder Cancer Neg Hx   . Kidney cancer Neg Hx     Social History Social History  Substance Use Topics  . Smoking status: Former Smoker    Types: Cigarettes    Quit date: 01/25/2014  . Smokeless tobacco: Never Used  . Alcohol use No    Review of Systems Constitutional: Negative for fever. Cardiovascular: Negative for chest pain. Respiratory: Negative for shortness of breath. Gastrointestinal: mild right flank pain. Genitourinary: Negative for dysuria. positive for hematuria. Neurological: Negative for headache 10-point ROS otherwise negative.  ____________________________________________   PHYSICAL EXAM:  VITAL SIGNS: ED Triage Vitals  Enc Vitals Group     BP 02/14/16 1646 (!) 116/58     Pulse Rate 02/14/16 1646 66     Resp 02/14/16 1646 18     Temp 02/14/16 1646 98.8 F (37.1 C)     Temp Source 02/14/16 1646 Oral     SpO2 02/14/16 1646 97 %     Weight 02/14/16 1646 140 lb (63.5 kg)     Height 02/14/16 1646 5\' 3"  (1.6 m)     Head Circumference --      Peak Flow --      Pain Score 02/14/16 1647 10     Pain Loc --      Pain Edu? --      Excl. in [place]? --     Constitutional: Alert and oriented. Well appearing and in no distress. Eyes: Normal exam ENT   Head: Normocephalic and atraumatic.   Mouth/Throat: Mucous membranes are moist. Cardiovascular: Normal rate, regular rhythm. No murmur Respiratory: Normal respiratory effort without tachypnea nor retractions. Breath sounds are clear  Gastrointestinal: Soft and nontender. No distention.no CVA tenderness. Musculoskeletal: Nontender with normal range of motion in all extremities.  Neurologic:  Normal speech and language. No gross focal neurologic deficits Skin:  Skin is warm, dry and intact.  Psychiatric: Mood and affect are normal.   ____________________________________________   INITIAL IMPRESSION /  ASSESSMENT AND PLAN / ED COURSE  Pertinent labs & imaging results that were available during my care of the patient were reviewed by me and considered in my medical decision making (see chart for details).  the patient presents the emergency department for hematuria for the past 2 days intermittent right flank pain per care provider. Patient's lab work is largely within normal limits, urinalysis is pending at this time. The patient appears very well, no significant tenderness on exam, no CVA tenderness.  Urinalysis consistent with urinary tract infection, nitrite positive. We will start the patient on Keflex 3 times a day. A urine culture has been sent. Patient and caregiver agreeable.  ____________________________________________   FINAL CLINICAL IMPRESSION(S) / ED DIAGNOSES  hematuria Urinary tract infection   Harvest Dark, MD 02/14/16 [phone]

## 2016-02-14 NOTE — ED Triage Notes (Signed)
Pt presents with caregiver with RLQ pain and hematuria x 2 days. Pt was seen here on 12/29 and a UTI was mentioned, but no antibiotics were given at that time. Pt alert & oriented with NAD noted.

## 2016-02-15 DIAGNOSIS — F0390 Unspecified dementia without behavioral disturbance: Secondary | ICD-10-CM | POA: Diagnosis not present

## 2016-02-16 DIAGNOSIS — F0390 Unspecified dementia without behavioral disturbance: Secondary | ICD-10-CM | POA: Diagnosis not present

## 2016-02-16 LAB — URINE CULTURE

## 2016-02-17 ENCOUNTER — Ambulatory Visit (INDEPENDENT_AMBULATORY_CARE_PROVIDER_SITE_OTHER): Payer: Medicare HMO | Admitting: Obstetrics and Gynecology

## 2016-02-17 ENCOUNTER — Encounter: Payer: Self-pay | Admitting: Obstetrics and Gynecology

## 2016-02-17 VITALS — BP 98/62 | HR 77 | Ht 63.0 in | Wt 137.7 lb

## 2016-02-17 DIAGNOSIS — R102 Pelvic and perineal pain: Secondary | ICD-10-CM

## 2016-02-17 DIAGNOSIS — N898 Other specified noninflammatory disorders of vagina: Secondary | ICD-10-CM | POA: Diagnosis not present

## 2016-02-17 DIAGNOSIS — F0390 Unspecified dementia without behavioral disturbance: Secondary | ICD-10-CM

## 2016-02-17 DIAGNOSIS — F03B0 Unspecified dementia, moderate, without behavioral disturbance, psychotic disturbance, mood disturbance, and anxiety: Secondary | ICD-10-CM

## 2016-02-17 NOTE — Patient Instructions (Signed)
1. Complete course of Lax as written 2. Vaginal cultures are sent to assess for vaginal discharge. Initial findings on the microscope slide were negative. 3. Return in pelvic pain recurs or worsens.

## 2016-02-17 NOTE — Progress Notes (Signed)
GYN ENCOUNTER NOTE  Subjective:  NEW PATIENT EVALUATION        Jamie Boyd is a 66 y.o. No obstetric history on file. female is here for gynecologic evaluation of the following issues:  1.Vaginal discharge  66 year old female with history of moderate dementia, recently treated for UTI, started on Keflex 02/14/2016, presents for evaluation of vaginal discharge. Vaginal discharge reportedly has been ongoing for approximately 1 year. She denies vulvar itching. She did note some pelvic pain associated with discomfort with urination and change in color of urine to yellow/red. Currently she is on Keflex 500 mg 3 times a day for suspected UTI. Urine culture from 02/14/2016 showed less than 10,000 colonies. Urinalysis from 02/14/2016 showed positive nitrite and moderate leukocytes.   Gynecologic History No LMP recorded. Patient is postmenopausal. Contraception: post menopausal status   Obstetric History OB History  Gravida Para Term Preterm AB Living  0 0 0 0 0    SAB TAB Ectopic Multiple Live Births  0 0 0            Past Medical History:  Diagnosis Date  . Anemia   . Dementia   . Goiter   . Memory deficit     Past Surgical History:  Procedure Laterality Date  . COLONOSCOPY WITH PROPOFOL N/A 02/18/2015   Procedure: COLONOSCOPY WITH PROPOFOL;  Surgeon: Hulen Luster, MD;  Location: [facility] ENDOSCOPY;  Service: Gastroenterology;  Laterality: N/A;  . ESOPHAGOGASTRODUODENOSCOPY N/A 02/18/2015   Procedure: ESOPHAGOGASTRODUODENOSCOPY (EGD);  Surgeon: Hulen Luster, MD;  Location: [facility] ENDOSCOPY;  Service: Gastroenterology;  Laterality: N/A;    Current Outpatient Prescriptions on File Prior to Visit  Medication Sig Dispense Refill  . cephALEXin (KEFLEX) 500 MG capsule Take 1 capsule (500 mg total) by mouth 3 (three) times daily. 30 capsule 0  . Disposable Gloves (RA LATEX MEDICAL GLOVES) MISC Use 1 Box 3 (three) times daily as needed.    . divalproex (DEPAKOTE) 250 MG DR tablet Take by mouth.     . docusate sodium (COLACE) 100 MG capsule Take 1 capsule by mouth 2 (two) times daily as needed.    . donepezil (ARICEPT) 5 MG tablet Take 5 mg by mouth at bedtime.    Marland Kitchen esomeprazole (NEXIUM) 40 MG capsule Take 1 capsule (40 mg total) by mouth daily. 30 capsule 1  . famotidine (PEPCID) 20 MG tablet Take 1 tablet (20 mg total) by mouth 2 (two) times daily as needed for heartburn or indigestion. 10 tablet 0  . ferrous sulfate 325 (65 FE) MG tablet Take by mouth.    . fexofenadine (ALLEGRA) 180 MG tablet Take by mouth.    . Incontinence Supply Disposable (ENTRUST PLUS BRIEFS MEDIUM) MISC Use 100 each 4 (four) times daily.    . isosorbide mononitrate (IMDUR) 30 MG 24 hr tablet Take by mouth.    . memantine (NAMENDA) 10 MG tablet Take by mouth.    . metoprolol succinate (TOPROL-XL) 25 MG 24 hr tablet Take by mouth.    . Misc. Devices (AIRGO ROLLING WALKER) MISC 1 Units by Does not apply route as directed. 1 each 0  . montelukast (SINGULAIR) 10 MG tablet Take by mouth.    . Multiple Vitamins-Minerals (CENTRUM SILVER ULTRA WOMENS PO) Take 1 tablet by mouth daily.     . phenazopyridine (PYRIDIUM) 200 MG tablet Take by mouth.    . QUEtiapine (SEROQUEL) 100 MG tablet Take 1.5 tabs at night    . sucralfate (CARAFATE) 1 g tablet Take by  mouth.     No current facility-administered medications on file prior to visit.     No Known Allergies  Social History   Social History  . Marital status: Single    Spouse name: N/A  . Number of children: N/A  . Years of education: N/A   Occupational History  . Not on file.   Social History Main Topics  . Smoking status: Former Smoker    Types: Cigarettes    Quit date: 01/25/2014  . Smokeless tobacco: Never Used  . Alcohol use No  . Drug use: No  . Sexual activity: Not Currently   Other Topics Concern  . Not on file   Social History Narrative   ** Merged History Encounter **        Family History  Problem Relation Age of Onset  . Prostate  cancer Neg Hx   . Bladder Cancer Neg Hx   . Kidney cancer Neg Hx     The following portions of the patient's history were reviewed and updated as appropriate: allergies, current medications, past family history, past medical history, past social history, past surgical history and problem list.  Review of Systems Review of Systems -Per history of present illness Objective:   BP 98/62   Pulse 77   Ht 5\' 3"  (1.6 m)   Wt 137 lb 11.2 oz (62.5 kg)   BMI 24.39 kg/m  CONSTITUTIONAL: Well-developed, well-nourished female in no acute distress. Mental status-consistent with dementia HENT:  Normocephalic, atraumatic.  NECK: Not examined SKIN: Skin is warm and dry. No rash noted. Not diaphoretic. No erythema. No pallor. [place]: Alert and oriented to person, place, and time. PSYCHIATRIC: Normal mood and affect. Normal behavior. Normal judgment and thought content. CARDIOVASCULAR:Not Examined RESPIRATORY: Not Examined BREASTS: Not Examined ABDOMEN: Soft, non distended; Non tender.  No Organomegaly. PELVIC:  External Genitalia: Normal  BUS: Normal  Vagina: Mild atrophy; thin white secretions in the vaginal vault; no lesions  Cervix: Normal; no significant discharge or lesions  Uterus: Normal size, shape,consistency, mobile  Adnexa: Normal; nonpalpable and nontender  RV: Normal external exam  Bladder: Nontender MUSCULOSKELETAL: Normal range of motion. No tenderness.  No cyanosis, clubbing, or edema.  PROCEDURE: Wet prep Normal saline-moderate white blood cells; no clue cells; no Trichomonas KOH-no yeast   Assessment:   1. Vaginal discharge - NuSwab Vaginitis Plus (VG+)  2. Pelvic pain, Associated with voiding; pelvic exam negative today for mass or tenderness     Plan:   1. Wet prep 2. Nuswab vaginitis plus 3. Return as needed if pelvic pain recurs or develops

## 2016-02-18 DIAGNOSIS — F0390 Unspecified dementia without behavioral disturbance: Secondary | ICD-10-CM | POA: Diagnosis not present

## 2016-02-19 DIAGNOSIS — F0390 Unspecified dementia without behavioral disturbance: Secondary | ICD-10-CM | POA: Diagnosis not present

## 2016-02-20 LAB — NUSWAB VAGINITIS PLUS (VG+)
Atopobium vaginae: HIGH Score — AB
Candida albicans, NAA: NEGATIVE
Candida glabrata, NAA: NEGATIVE
Chlamydia trachomatis, NAA: NEGATIVE
Neisseria gonorrhoeae, NAA: NEGATIVE
TRICH VAG BY NAA: NEGATIVE

## 2016-02-21 DIAGNOSIS — F0390 Unspecified dementia without behavioral disturbance: Secondary | ICD-10-CM | POA: Diagnosis not present

## 2016-02-22 DIAGNOSIS — F0390 Unspecified dementia without behavioral disturbance: Secondary | ICD-10-CM | POA: Diagnosis not present

## 2016-02-26 DIAGNOSIS — F0390 Unspecified dementia without behavioral disturbance: Secondary | ICD-10-CM | POA: Diagnosis not present

## 2016-02-28 DIAGNOSIS — F0390 Unspecified dementia without behavioral disturbance: Secondary | ICD-10-CM | POA: Diagnosis not present

## 2016-02-29 DIAGNOSIS — F0390 Unspecified dementia without behavioral disturbance: Secondary | ICD-10-CM | POA: Diagnosis not present

## 2016-03-01 DIAGNOSIS — F0390 Unspecified dementia without behavioral disturbance: Secondary | ICD-10-CM | POA: Diagnosis not present

## 2016-03-02 ENCOUNTER — Telehealth: Payer: Self-pay

## 2016-03-02 DIAGNOSIS — F0390 Unspecified dementia without behavioral disturbance: Secondary | ICD-10-CM | POA: Diagnosis not present

## 2016-03-02 MED ORDER — METRONIDAZOLE 0.75 % VA GEL: 1.0000 | Freq: Every day | VAGINAL | 0 refills | Status: DC

## 2016-03-02 NOTE — Telephone Encounter (Signed)
-----   Message from Brayton Mars, MD sent at 03/01/2016  8:07 AM EST ----- Please notify - Abnormal Labs Borderline abnormal test for BV; Recommend Metrogel intravaginal daily x 5 days

## 2016-03-02 NOTE — Telephone Encounter (Signed)
Lattie Haw aware of results. Med erx. Lattie Haw aware if sx still persits after completion of meds, she will need to be seen.

## 2016-03-03 DIAGNOSIS — F0390 Unspecified dementia without behavioral disturbance: Secondary | ICD-10-CM | POA: Diagnosis not present

## 2016-03-04 DIAGNOSIS — F0390 Unspecified dementia without behavioral disturbance: Secondary | ICD-10-CM | POA: Diagnosis not present

## 2016-03-06 DIAGNOSIS — F0390 Unspecified dementia without behavioral disturbance: Secondary | ICD-10-CM | POA: Diagnosis not present

## 2016-03-07 DIAGNOSIS — F0390 Unspecified dementia without behavioral disturbance: Secondary | ICD-10-CM | POA: Diagnosis not present

## 2016-03-08 DIAGNOSIS — F0390 Unspecified dementia without behavioral disturbance: Secondary | ICD-10-CM | POA: Diagnosis not present

## 2016-03-10 ENCOUNTER — Telehealth: Payer: Self-pay | Admitting: Obstetrics and Gynecology

## 2016-03-10 NOTE — Telephone Encounter (Signed)
Lisa(EC) called stating the patient still has a yeast infection and didn't know if she needed more medication called in. Please Advise.

## 2016-03-10 NOTE — Telephone Encounter (Signed)
Pt is s/p metrogel for 5 nights for BV. Her care giver states she is still having a dark d/c and pos odor. No pelvic pain. Appt made for 03/30/16 at 12:45.

## 2016-03-30 ENCOUNTER — Encounter: Payer: Commercial Managed Care - HMO | Admitting: Obstetrics and Gynecology

## 2016-04-04 ENCOUNTER — Encounter: Payer: Self-pay | Admitting: Obstetrics and Gynecology

## 2016-04-04 ENCOUNTER — Ambulatory Visit (INDEPENDENT_AMBULATORY_CARE_PROVIDER_SITE_OTHER): Payer: Medicare PPO | Admitting: Obstetrics and Gynecology

## 2016-04-04 VITALS — BP 116/74 | HR 96 | Ht 63.0 in | Wt 137.2 lb

## 2016-04-04 DIAGNOSIS — N390 Urinary tract infection, site not specified: Secondary | ICD-10-CM

## 2016-04-04 DIAGNOSIS — N898 Other specified noninflammatory disorders of vagina: Secondary | ICD-10-CM | POA: Diagnosis not present

## 2016-04-04 LAB — POCT URINALYSIS DIPSTICK
BILIRUBIN UA: NEGATIVE
Glucose, UA: NEGATIVE
NITRITE UA: NEGATIVE
PH UA: 6.5
Spec Grav, UA: 1.015
Urobilinogen, UA: NEGATIVE

## 2016-04-04 MED ORDER — FLUCONAZOLE 150 MG PO TABS: 150.0000 mg | ORAL_TABLET | Freq: Once | ORAL | 3 refills | Status: AC

## 2016-04-04 NOTE — Patient Instructions (Signed)
1. Diflucan 150 mg orally 1 dose; refill timeline is also given 2. Urinalysis and urine culture are obtained to rule out bladder infection 3. Return as needed if symptoms persist

## 2016-04-04 NOTE — Progress Notes (Signed)
Chief complaint: 1. Urinary frequency 2. Urinary urgency 3. Vaginal discharge  66 year old female with dementia, presents in the company of her caretaker for evaluation of complaints. She has been voiding larger volumes of urine on a more frequent basis; she has experienced some discomfort with urination. She has noted some pink drainage on her pads. Patient also is complaining of a yellow vaginal discharge with associated vulvar itching. She has not taken any antibiotics recently. She has not self medicated with anything for management of symptoms.  Past medical history, past surgical history, problem list, medications and allergies are reviewed  OBJECTIVE: BP 116/74   Pulse 96   Ht 5\' 3"  (1.6 m)   Wt 137 lb 3.2 oz (62.2 kg)   BMI 24.30 kg/m  Pleasant elderly female in no acute distress Abdomen: Soft, nontender Pelvic exam: External genitalia-normal BUS-normal Vagina-moderate atrophic changes; milky white discharge present Bimanual-single digit exam; no palpable mass or tenderness Rectovaginal-normal external exam  PROCEDURE: Wet prep Normal saline-moderate white blood cells; no clue cells; no Trichomonas KOH-no yeast cells  ASSESSMENT: 1. Urinary frequency and dysuria, cannot rule out UTI 2. Vaginal discharge with leukorrhea noted on wet prep; suspect Monilia vaginitis  PLAN: 1. Wet prep as noted 2. Nuswab 3. Diflucan 150 mg orally 1 dose; refill 2 4. Urinalysis and urine culture to assess for cystitis  A total of 15 minutes were spent face-to-face with the patient during this encounter and over half of that time dealt with counseling and coordination of care.  Brayton Mars, MD  Note: This dictation was prepared with Dragon dictation along with smaller phrase technology. Any transcriptional errors that result from this process are unintentional.

## 2016-04-07 ENCOUNTER — Telehealth: Payer: Self-pay

## 2016-04-07 LAB — NUSWAB VAGINITIS PLUS (VG+)
CANDIDA ALBICANS, NAA: NEGATIVE
CHLAMYDIA TRACHOMATIS, NAA: NEGATIVE
Candida glabrata, NAA: NEGATIVE
NEISSERIA GONORRHOEAE, NAA: NEGATIVE
Trich vag by NAA: NEGATIVE

## 2016-04-07 LAB — URINE CULTURE

## 2016-04-07 MED ORDER — SULFAMETHOXAZOLE-TRIMETHOPRIM 800-160 MG PO TABS: 1.0000 | ORAL_TABLET | Freq: Two times a day (BID) | ORAL | 0 refills | Status: DC

## 2016-04-07 NOTE — Telephone Encounter (Signed)
-----   Message from Brayton Mars, MD sent at 04/07/2016 12:30 PM EST ----- Please notify - Abnormal Labs Please call in Septra DS twice daily for 7 days

## 2016-04-07 NOTE — Telephone Encounter (Signed)
Lisa aware. Med erx. Advised push h20.

## 2016-05-17 ENCOUNTER — Encounter: Payer: Self-pay | Admitting: Obstetrics and Gynecology

## 2016-05-17 ENCOUNTER — Ambulatory Visit (INDEPENDENT_AMBULATORY_CARE_PROVIDER_SITE_OTHER): Payer: Medicare PPO | Admitting: Obstetrics and Gynecology

## 2016-05-17 VITALS — BP 98/62 | HR 70 | Ht 63.0 in | Wt 137.8 lb

## 2016-05-17 DIAGNOSIS — N760 Acute vaginitis: Secondary | ICD-10-CM | POA: Diagnosis not present

## 2016-05-17 DIAGNOSIS — B9689 Other specified bacterial agents as the cause of diseases classified elsewhere: Secondary | ICD-10-CM

## 2016-05-17 DIAGNOSIS — R350 Frequency of micturition: Secondary | ICD-10-CM | POA: Diagnosis not present

## 2016-05-17 LAB — POCT URINALYSIS DIPSTICK
Bilirubin, UA: NEGATIVE
Blood, UA: NEGATIVE
Glucose, UA: NEGATIVE
Ketones, UA: NEGATIVE
Nitrite, UA: NEGATIVE
PH UA: 7 (ref 5.0–8.0)
PROTEIN UA: NEGATIVE
Spec Grav, UA: 1.01 (ref 1.010–1.025)
UROBILINOGEN UA: NEGATIVE U/dL — AB

## 2016-05-17 MED ORDER — METRONIDAZOLE 500 MG PO TABS: 500.0000 mg | ORAL_TABLET | Freq: Two times a day (BID) | ORAL | 0 refills | Status: AC

## 2016-05-17 NOTE — Patient Instructions (Signed)
1. Take metronidazole 500 mg twice a day for 7 days 2. Follow-up as needed  Metronidazole extended-release tablets What is this medicine? METRONIDAZOLE (me troe NI da zole) is an antiinfective. It is used to treat certain kinds of bacterial and protozoal infections. It will not work for colds, flu, or other viral infections. This medicine may be used for other purposes; ask your health care provider or pharmacist if you have questions. COMMON BRAND NAME(S): Flagyl ER What should I tell my health care provider before I take this medicine? They need to know if you have any of these conditions: -anemia or other blood disorders -disease of the nervous system -fungal or yeast infection -if you drink alcohol containing drinks -liver disease -seizures -an unusual or allergic reaction to metronidazole, or other medicines, foods, dyes, or preservatives -pregnant or trying to get pregnant -breast-feeding How should I use this medicine? Take this medicine by mouth with a full glass of water. Follow the directions on the prescription label. Do not crush or chew. Take this medicine on an empty stomach 1 hour before or 2 hours after meals or food. Take your medicine at regular intervals. Do not take your medicine more often than directed. Take all of your medicine as directed even if you think you are better. Do not skip doses or stop your medicine early. Talk to your pediatrician regarding the use of this medicine in children. Special care may be needed. Overdosage: If you think you have taken too much of this medicine contact a poison control center or emergency room at once. NOTE: This medicine is only for you. Do not share this medicine with others. What if I miss a dose? If you miss a dose, take it as soon as you can. If it is almost time for your next dose, take only that dose. Do not take double or extra doses. What may interact with this medicine? Do not take this medicine with any of the  following medications: -alcohol or any product that contains alcohol -amprenavir oral solution -cisapride -disulfiram -dofetilide -dronedarone -paclitaxel injection -pimozide -ritonavir oral solution -sertraline oral solution -sulfamethoxazole-trimethoprim injection -thioridazine -ziprasidone This medicine may also interact with the following medications: -birth control pills -cimetidine -lithium -other medicines that prolong the QT interval (cause an abnormal heart rhythm) -phenobarbital -phenytoin -warfarin This list may not describe all possible interactions. Give your health care provider a list of all the medicines, herbs, non-prescription drugs, or dietary supplements you use. Also tell them if you smoke, drink alcohol, or use illegal drugs. Some items may interact with your medicine. What should I watch for while using this medicine? Tell your doctor or health care professional if your symptoms do not improve or if they get worse. You may get drowsy or dizzy. Do not drive, use machinery, or do anything that needs mental alertness until you know how this medicine affects you. Do not stand or sit up quickly, especially if you are an older patient. This reduces the risk of dizzy or fainting spells. Avoid alcoholic drinks while you are taking this medicine and for three days afterward. Alcohol may make you feel dizzy, sick, or flushed. If you are being treated for a sexually transmitted disease, avoid sexual contact until you have finished your treatment. Your sexual partner may also need treatment. What side effects may I notice from receiving this medicine? Side effects that you should report to your doctor or health care professional as soon as possible: -allergic reactions like skin rash, itching  or hives, swelling of the face, lips, or tongue -confusion, clumsiness -difficulty speaking -discolored or sore mouth -dizziness -fever, infection -numbness, tingling, pain or  weakness in the hands or feet -trouble passing urine or change in the amount of urine -redness, blistering, peeling or loosening of the skin, including inside the mouth -seizures -unusually weak or tired -vaginal irritation, dryness, or discharge Side effects that usually do not require medical attention (report to your doctor or health care professional if they continue or are bothersome): -diarrhea -headache -irritability -metallic taste -nausea -stomach pain or cramps -trouble sleeping This list may not describe all possible side effects. Call your doctor for medical advice about side effects. You may report side effects to FDA at 1-800-FDA-1088. Where should I keep my medicine? Keep out of the reach of children. Store at room temperature between 15 and 30 degrees C (59 and 86 degrees F). Protect from light and moisture. Keep container tightly closed. Throw away any unused medicine after the expiration date. NOTE: This sheet is a summary. It may not cover all possible information. If you have questions about this medicine, talk to your doctor, pharmacist, or health care provider.  2018 Elsevier/Gold Standard (2012-08-30 14:05:10)

## 2016-05-17 NOTE — Progress Notes (Signed)
Chief complaint: 1. Vaginal discharge 2. Urinary urgency  Patient presents for evaluation. She has been having an atypical discharge with no associated itching or burning over the past 2 weeks. There was a question of possible vaginal bleeding in association with this discharge.  Patient is not having any dysuria. She does have urinary urgency.  Past family history, past surgical history, problem list, medications, and allergies are reviewed.  OBJECTIVE: BP 98/62   Pulse 70   Ht 5\' 3"  (1.6 m)   Wt 137 lb 12.8 oz (62.5 kg)   BMI 24.41 kg/m   Pleasant female in no acute distress. mild dementia symptoms present. Care taker is in attendance. Abdomen: Soft, nontender Pelvic exam: External genitalia-atrophic changes BUS-normal Vagina-atrophic changes; minimal yellow white secretions in vault Cervix-not visualized Uterus-not palpated Adnexa-nonpalpable and nontender  PROCEDURE: Wet prep Normal saline-moderate white blood cells; no Trichomonas; clue cells present KOH-negative whiff test; no yeast  ASSESSMENT: 1. Urinary urgency 2. Probable bacterial vaginosis  PLAN: 1. Urinalysis and urine culture 2. Wet prep was noted 3. Metronidazole 500 mg twice day for 7 days 4. Return if symptoms persist over the next 2 weeks  Brayton Mars, MD  Note: This dictation was prepared with Dragon dictation along with smaller phrase technology. Any transcriptional errors that result from this process are unintentional.

## 2016-05-19 LAB — URINE CULTURE

## 2016-06-22 ENCOUNTER — Emergency Department: Payer: Medicare PPO

## 2016-06-22 ENCOUNTER — Emergency Department
Admission: EM | Admit: 2016-06-22 | Discharge: 2016-06-22 | Disposition: A | Discharge status: Home / Self Care | Payer: Medicare PPO | Attending: Emergency Medicine | Admitting: Emergency Medicine

## 2016-06-22 ENCOUNTER — Encounter: Payer: Self-pay | Admitting: Emergency Medicine

## 2016-06-22 DIAGNOSIS — M1712 Unilateral primary osteoarthritis, left knee: Secondary | ICD-10-CM | POA: Diagnosis not present

## 2016-06-22 DIAGNOSIS — M549 Dorsalgia, unspecified: Secondary | ICD-10-CM | POA: Insufficient documentation

## 2016-06-22 DIAGNOSIS — N3000 Acute cystitis without hematuria: Secondary | ICD-10-CM | POA: Insufficient documentation

## 2016-06-22 DIAGNOSIS — M79605 Pain in left leg: Secondary | ICD-10-CM | POA: Diagnosis present

## 2016-06-22 DIAGNOSIS — Z79899 Other long term (current) drug therapy: Secondary | ICD-10-CM | POA: Diagnosis not present

## 2016-06-22 DIAGNOSIS — Z87891 Personal history of nicotine dependence: Secondary | ICD-10-CM | POA: Insufficient documentation

## 2016-06-22 LAB — URINALYSIS, ROUTINE W REFLEX MICROSCOPIC
BILIRUBIN URINE: NEGATIVE
Bacteria, UA: NONE SEEN
Glucose, UA: NEGATIVE mg/dL
HGB URINE DIPSTICK: NEGATIVE
Ketones, ur: NEGATIVE mg/dL
Nitrite: NEGATIVE
PROTEIN: NEGATIVE mg/dL
RBC / HPF: NONE SEEN RBC/hpf (ref 0–5)
Specific Gravity, Urine: 1.008 (ref 1.005–1.030)
pH: 6 (ref 5.0–8.0)

## 2016-06-22 MED ORDER — DICLOFENAC SODIUM 50 MG PO TBEC: 50.0000 mg | DELAYED_RELEASE_TABLET | Freq: Two times a day (BID) | ORAL | 0 refills | Status: DC

## 2016-06-22 MED ORDER — CEPHALEXIN 500 MG PO CAPS
500.0000 mg | ORAL_CAPSULE | Freq: Once | ORAL | Status: AC
  Administered 2016-06-22: 500 mg via ORAL
  Filled 2016-06-22: qty 1

## 2016-06-22 MED ORDER — CEPHALEXIN 500 MG PO CAPS: 500.0000 mg | ORAL_CAPSULE | Freq: Two times a day (BID) | ORAL | 0 refills | Status: DC

## 2016-06-22 NOTE — ED Provider Notes (Signed)
[facility] Emergency Department Provider Note ____________________________________________  Time seen: 1717  I have reviewed the triage vital signs and the nursing notes.  HISTORY  Chief Complaint  Leg Pain and Back Pain  HPI Jamie Boyd is a 66 y.o. female presents to the ED for evaluation of low back pain and left leg pain of the last 2-3 weeks. Patient is in the care of her dementia caregiver, who reports that the patient has reported intermittent pain to the left leg from the knees to the ankle. This been no reported injury, accident, trauma, or fall. Patient also has history of recurrent UTIs. She is complaining of some low back pain in the last few days. No fevers, chills, nausea, or hematuria is noted.  Past Medical History:  Diagnosis Date  . Anemia   . Dementia   . Goiter   . Memory deficit     Patient Active Problem List   Diagnosis Date Noted  . Agitation 11/23/2015  . Moderate dementia, with behavioral disturbance 11/23/2015  . Gastroesophageal reflux disease without esophagitis 09/11/2015  . Iron deficiency anemia due to chronic blood loss 06/02/2015  . GERD without esophagitis 06/01/2015  . Difficulty sleeping 04/27/2015  . Moderate dementia without behavioral disturbance 03/01/2015  . Acute UTI 01/29/2015  . Anemia 01/29/2015  . Memory deficit 01/29/2015    Past Surgical History:  Procedure Laterality Date  . COLONOSCOPY WITH PROPOFOL N/A 02/18/2015   Procedure: COLONOSCOPY WITH PROPOFOL;  Surgeon: Hulen Luster, MD;  Location: [facility] ENDOSCOPY;  Service: Gastroenterology;  Laterality: N/A;  . ESOPHAGOGASTRODUODENOSCOPY N/A 02/18/2015   Procedure: ESOPHAGOGASTRODUODENOSCOPY (EGD);  Surgeon: Hulen Luster, MD;  Location: [facility] ENDOSCOPY;  Service: Gastroenterology;  Laterality: N/A;    Prior to Admission medications   Medication Sig Start Date End Date Taking? Authorizing Provider  cephALEXin (KEFLEX) 500 MG capsule Take 1 capsule (500 mg  total) by mouth 2 (two) times daily. 06/22/16   Leeandre Nordling, Dannielle Karvonen, PA-C  diclofenac (VOLTAREN) 50 MG EC tablet Take 1 tablet (50 mg total) by mouth 2 (two) times daily. 06/22/16   Tikesha Mort, Dannielle Karvonen, PA-C  Disposable Gloves (RA LATEX MEDICAL GLOVES) MISC Use 1 Box 3 (three) times daily as needed. 09/13/15   [provider]  divalproex (DEPAKOTE) 250 MG DR tablet Take by mouth. 11/23/15   [provider]  donepezil (ARICEPT) 5 MG tablet Take 5 mg by mouth at bedtime.    [provider]  famotidine (PEPCID) 20 MG tablet Take 1 tablet (20 mg total) by mouth 2 (two) times daily as needed for heartburn or indigestion. 02/24/15 05/17/16  Drenda Freeze, MD  ferrous sulfate 325 (65 FE) MG EC tablet Take 325 mg by mouth 3 (three) times daily with meals.    [provider]  fexofenadine (ALLEGRA) 180 MG tablet Take by mouth. 09/13/15 09/12/16  [provider]  Incontinence Supply Disposable (ENTRUST PLUS BRIEFS MEDIUM) MISC Use 100 each 4 (four) times daily. 10/04/15   [provider]  isosorbide mononitrate (IMDUR) 30 MG 24 hr tablet Take by mouth. 11/10/15 11/09/16  [provider]  memantine (NAMENDA) 10 MG tablet Take by mouth. 11/23/15 11/22/16  [provider]  metoprolol succinate (TOPROL-XL) 25 MG 24 hr tablet Take by mouth. 11/10/15 11/09/16  [provider]  Misc. Devices (AIRGO ROLLING WALKER) MISC 1 Units by Does not apply route as directed. 06/21/15   Harvest Dark, MD  montelukast (SINGULAIR) 10 MG tablet Take by mouth.  08/05/15 08/04/16  [provider]  Multiple Vitamins-Minerals (CENTRUM SILVER ULTRA WOMENS PO) Take 1 tablet by mouth daily.     [provider]  phenazopyridine (PYRIDIUM) 200 MG tablet Take by mouth.    [provider]  QUEtiapine (SEROQUEL) 100 MG tablet Take 1.5 tabs at night 11/23/15   [provider]  sucralfate (CARAFATE) 1 g tablet Take by mouth. 09/10/15  09/09/16  [provider]  traZODone (DESYREL) 100 MG tablet Take 1/2 tab at night for a week then increase to 1 whole tab and continue that dose 04/11/16   [provider]    Allergies Patient has no known allergies.  Family History  Problem Relation Age of Onset  . Prostate cancer Neg Hx   . Bladder Cancer Neg Hx   . Kidney cancer Neg Hx     Social History Social History  Substance Use Topics  . Smoking status: Former Smoker    Types: Cigarettes    Quit date: 01/25/2014  . Smokeless tobacco: Never Used  . Alcohol use No    Review of Systems  Constitutional: Negative for fever. Cardiovascular: Negative for chest pain. Respiratory: Negative for shortness of breath. Gastrointestinal: Negative for abdominal pain, vomiting and diarrhea. Genitourinary: Negative for dysuria. Musculoskeletal: Positive for back pain. Reports left knee pain as above Skin: Negative for rash. Neurological: Negative for headaches, focal weakness or numbness. ____________________________________________  PHYSICAL EXAM:  VITAL SIGNS: ED Triage Vitals  Enc Vitals Group     BP 06/22/16 1534 129/62     Pulse Rate 06/22/16 1534 (!) 57     Resp 06/22/16 1534 16     Temp 06/22/16 1534 98.2 F (36.8 C)     Temp Source 06/22/16 1534 Oral     SpO2 06/22/16 1534 100 %     Weight 06/22/16 1535 140 lb (63.5 kg)     Height 06/22/16 1535 5\' 3"  (1.6 m)     Head Circumference --      Peak Flow --      Pain Score --      Pain Loc --      Pain Edu? --      Excl. in [place]? --     Constitutional: Alert and oriented. Well appearing and in no distress. Head: Normocephalic and atraumatic. Cardiovascular: Normal rate, regular rhythm. Normal distal pulses. Respiratory: Normal respiratory effort. No wheezes/rales/rhonchi. Gastrointestinal: Soft and nontender. No distention. Musculoskeletal: Nontender with normal range of motion in all extremities. Right knee without obvious deformity. Normal ROM  without laxity. No popliteal space fullness. Normal ankle exam.  Neurologic:  Mildly antalgic gait, favoring the left LE without ataxia. Normal speech and language. No gross focal neurologic deficits are appreciated. Skin:  Skin is warm, dry and intact. No rash noted. Psychiatric: Mood and affect are normal. Patient exhibits appropriate insight and judgment. ____________________________________________   LABS (pertinent positives/negatives) Labs Reviewed  URINALYSIS, ROUTINE W REFLEX MICROSCOPIC - Abnormal; Notable for the following:       Result Value   Color, Urine STRAW (*)    APPearance CLEAR (*)    Leukocytes, UA SMALL (*)    Squamous Epithelial / LPF 0-5 (*)    All other components within normal limits  URINE CULTURE  ____________________________________________   RADIOLOGY  Left Knee IMPRESSION: Minimal degenerative changes.  No acute osseous abnormality. ____________________________________________  PROCEDURES  Keflex 500 mg PO ____________________________________________  INITIAL IMPRESSION / ASSESSMENT AND PLAN / ED COURSE  Patient with an ED evaluation  of some intermittent low back pain as well as some left leg pain. Her exam is otherwise benign on the leg with an x-ray revealing some mild joint changes. Her urinalysis was indicative) acute cystitis. She'll be discharged with a prescription for Keflex and Voltaren to dose as directed. She'll follow-up with primary care provider for ongoing symptom management. Urine culture is pending at the time of discharge. ____________________________________________  FINAL CLINICAL IMPRESSION(S) / ED DIAGNOSES  Final diagnoses:  Left leg pain  Acute cystitis without hematuria  Primary osteoarthritis of left knee      Tylan Briguglio, Dannielle Karvonen, PA-C 06/24/16 0012    Earleen Newport, MD 06/26/16 [phone]

## 2016-06-22 NOTE — Discharge Instructions (Signed)
Your exam, labs, and x-ray reveals a UTI along with left knee pain due to mild arthritis. Take the antibiotic as directed until all pills are gone. Take the anti-inflammatory as needed for knee pain relief. Follow-up with Dr. Iona Beard as needed.

## 2016-06-22 NOTE — ED Triage Notes (Signed)
Per caregiver pt is having low back pain, and left leg pain x 2 weeks. No injury

## 2016-06-22 NOTE — ED Notes (Signed)
See triage note  Per care givers she has had pain to leg for about 3 weeks  Also now developed some lower back pain  Hx of uti's in past

## 2016-06-24 LAB — URINE CULTURE: SPECIAL REQUESTS: NORMAL

## 2016-07-12 DIAGNOSIS — N390 Urinary tract infection, site not specified: Secondary | ICD-10-CM | POA: Insufficient documentation

## 2016-07-12 DIAGNOSIS — J309 Allergic rhinitis, unspecified: Secondary | ICD-10-CM | POA: Insufficient documentation

## 2016-07-13 ENCOUNTER — Ambulatory Visit (INDEPENDENT_AMBULATORY_CARE_PROVIDER_SITE_OTHER): Payer: Medicare PPO | Admitting: Obstetrics and Gynecology

## 2016-07-13 ENCOUNTER — Encounter: Payer: Self-pay | Admitting: Obstetrics and Gynecology

## 2016-07-13 VITALS — BP 79/46 | HR 67 | Ht 63.0 in | Wt 136.4 lb

## 2016-07-13 DIAGNOSIS — R32 Unspecified urinary incontinence: Secondary | ICD-10-CM

## 2016-07-13 DIAGNOSIS — N390 Urinary tract infection, site not specified: Secondary | ICD-10-CM | POA: Diagnosis not present

## 2016-07-13 DIAGNOSIS — R350 Frequency of micturition: Secondary | ICD-10-CM | POA: Diagnosis not present

## 2016-07-13 LAB — POCT URINALYSIS DIPSTICK
BILIRUBIN UA: NEGATIVE
Blood, UA: NEGATIVE
Glucose, UA: NEGATIVE
KETONES UA: NEGATIVE
Nitrite, UA: NEGATIVE
Protein, UA: NEGATIVE
SPEC GRAV UA: 1.015 (ref 1.010–1.025)
Urobilinogen, UA: 0.2 E.U./dL
pH, UA: 6 (ref 5.0–8.0)

## 2016-07-13 MED ORDER — SULFAMETHOXAZOLE-TRIMETHOPRIM 800-160 MG PO TABS: 1.0000 | ORAL_TABLET | Freq: Two times a day (BID) | ORAL | 0 refills | Status: DC

## 2016-07-13 NOTE — Progress Notes (Signed)
Chief complaint: 1. Urinary frequency 2. Urinary incontinence 3. History of recurrent UTI  Patient presents today for evaluation. She has had increasing episodes of incontinence up to 7 per day. She does report some urinary burning and frequency of voiding. She has not had any significant fevers. She does report some occasional backache. Patient has had several episodes of UTIs in the past. Patient's dementia makes it difficult to communicate symptomatology.  06/22/2016 urine culture-multiple species 05/17/2016 urine culture-less than 10,000 colonies 04/04/2016 urine culture-50-100,000 Klebsiella pneumoniae 02/14/2016 urine culture-less than 10,000 colonies 11/08/2015 urine culture-multiple species  Past Medical History:  Diagnosis Date  . Anemia   . Dementia   . Goiter   . Memory deficit    Review of systems: Per history of present illness  OBJECTIVE: BP (!) 79/46   Pulse 67   Ht 5\' 3"  (1.6 m)   Wt 136 lb 6.4 oz (61.9 kg)   BMI 24.16 kg/m  Pleasant elderly female in no acute distress Back: No CVA tenderness Abdomen: Soft, nontender; suprapubic region nontender Pelvic: External genitalia-normal BUS-normal Bimanual-slight tenderness of the anterior vagina and bladder Rectovaginal-normal external exam  ASSESSMENT: 1. Urinary frequency 2. Dysuria 3. History of recurrent UTI 4. Dementia  PLAN: 1. Urinalysis with urine culture 2. Increase water intake and cranberry tablet intake 3. Septra DS twice a day for 7 days 4. Return as needed  A total of 15 minutes were spent face-to-face with the patient during this encounter and over half of that time dealt with counseling and coordination of care.  Brayton Mars, MD  Note: This dictation was prepared with Dragon dictation along with smaller phrase technology. Any transcriptional errors that result from this process are unintentional.

## 2016-07-13 NOTE — Patient Instructions (Signed)
1. Urinalysis and urine culture is sent to rule out infection 2. Begin taking Septra DS twice a day for 7 days 3. Increase water intake and cranberry juice or cranberry tablet intake 4. Return as needed   Urinary Tract Infection, Adult A urinary tract infection (UTI) is an infection of any part of the urinary tract, which includes the kidneys, ureters, bladder, and urethra. These organs make, store, and get rid of urine in the body. UTI can be a bladder infection (cystitis) or kidney infection (pyelonephritis). What are the causes? This infection may be caused by fungi, viruses, or bacteria. Bacteria are the most common cause of UTIs. This condition can also be caused by repeated incomplete emptying of the bladder during urination. What increases the risk? This condition is more likely to develop if:  You ignore your need to urinate or hold urine for long periods of time.  You do not empty your bladder completely during urination.  You wipe back to front after urinating or having a bowel movement, if you are female.  You are uncircumcised, if you are female.  You are constipated.  You have a urinary catheter that stays in place (indwelling).  You have a weak defense (immune) system.  You have a medical condition that affects your bowels, kidneys, or bladder.  You have diabetes.  You take antibiotic medicines frequently or for long periods of time, and the antibiotics no longer work well against certain types of infections (antibiotic resistance).  You take medicines that irritate your urinary tract.  You are exposed to chemicals that irritate your urinary tract.  You are female.  What are the signs or symptoms? Symptoms of this condition include:  Fever.  Frequent urination or passing small amounts of urine frequently.  Needing to urinate urgently.  Pain or burning with urination.  Urine that smells bad or unusual.  Cloudy urine.  Pain in the lower abdomen or  back.  Trouble urinating.  Blood in the urine.  Vomiting or being less hungry than normal.  Diarrhea or abdominal pain.  Vaginal discharge, if you are female.  How is this diagnosed? This condition is diagnosed with a medical history and physical exam. You will also need to provide a urine sample to test your urine. Other tests may be done, including:  Blood tests.  Sexually transmitted disease (STD) testing.  If you have had more than one UTI, a cystoscopy or imaging studies may be done to determine the cause of the infections. How is this treated? Treatment for this condition often includes a combination of two or more of the following:  Antibiotic medicine.  Other medicines to treat less common causes of UTI.  Over-the-counter medicines to treat pain.  Drinking enough water to stay hydrated.  Follow these instructions at home:  Take over-the-counter and prescription medicines only as told by your health care provider.  If you were prescribed an antibiotic, take it as told by your health care provider. Do not stop taking the antibiotic even if you start to feel better.  Avoid alcohol, caffeine, tea, and carbonated beverages. They can irritate your bladder.  Drink enough fluid to keep your urine clear or pale yellow.  Keep all follow-up visits as told by your health care provider. This is important.  Make sure to: ? Empty your bladder often and completely. Do not hold urine for long periods of time. ? Empty your bladder before and after sex. ? Wipe from front to back after a bowel movement  if you are female. Use each tissue one time when you wipe. Contact a health care provider if:  You have back pain.  You have a fever.  You feel nauseous or vomit.  Your symptoms do not get better after 3 days.  Your symptoms go away and then return. Get help right away if:  You have severe back pain or lower abdominal pain.  You are vomiting and cannot keep down any  medicines or water. This information is not intended to replace advice given to you by your health care provider. Make sure you discuss any questions you have with your health care provider. Document Released: 11/02/2004 Document Revised: 07/07/2015 Document Reviewed: 12/14/2014 Elsevier Interactive Patient Education  2017 [employer].

## 2016-07-15 LAB — URINE CULTURE

## 2016-10-29 ENCOUNTER — Emergency Department
Admission: EM | Admit: 2016-10-29 | Discharge: 2016-10-29 | Disposition: A | Discharge status: Home / Self Care | Payer: Medicare Other | Attending: Emergency Medicine | Admitting: Emergency Medicine

## 2016-10-29 ENCOUNTER — Encounter: Payer: Self-pay | Admitting: Emergency Medicine

## 2016-10-29 DIAGNOSIS — N390 Urinary tract infection, site not specified: Secondary | ICD-10-CM | POA: Insufficient documentation

## 2016-10-29 DIAGNOSIS — R440 Auditory hallucinations: Secondary | ICD-10-CM | POA: Diagnosis present

## 2016-10-29 DIAGNOSIS — F0390 Unspecified dementia without behavioral disturbance: Secondary | ICD-10-CM | POA: Insufficient documentation

## 2016-10-29 DIAGNOSIS — Z79899 Other long term (current) drug therapy: Secondary | ICD-10-CM | POA: Diagnosis not present

## 2016-10-29 DIAGNOSIS — R443 Hallucinations, unspecified: Secondary | ICD-10-CM

## 2016-10-29 DIAGNOSIS — Z791 Long term (current) use of non-steroidal anti-inflammatories (NSAID): Secondary | ICD-10-CM | POA: Insufficient documentation

## 2016-10-29 DIAGNOSIS — Z87891 Personal history of nicotine dependence: Secondary | ICD-10-CM | POA: Insufficient documentation

## 2016-10-29 LAB — CBC
HCT: 34 % — ABNORMAL LOW (ref 35.0–47.0)
Hemoglobin: 11.6 g/dL — ABNORMAL LOW (ref 12.0–16.0)
MCH: 31.9 pg (ref 26.0–34.0)
MCHC: 34.2 g/dL (ref 32.0–36.0)
MCV: 93.3 fL (ref 80.0–100.0)
PLATELETS: 229 10*3/uL (ref 150–440)
RBC: 3.64 MIL/uL — ABNORMAL LOW (ref 3.80–5.20)
RDW: 14.3 % (ref 11.5–14.5)
WBC: 9.9 10*3/uL (ref 3.6–11.0)

## 2016-10-29 LAB — COMPREHENSIVE METABOLIC PANEL
ALBUMIN: 3.6 g/dL (ref 3.5–5.0)
ALK PHOS: 54 U/L (ref 38–126)
ALT: 26 U/L (ref 14–54)
AST: 32 U/L (ref 15–41)
Anion gap: 8 (ref 5–15)
BILIRUBIN TOTAL: 0.3 mg/dL (ref 0.3–1.2)
BUN: 20 mg/dL (ref 6–20)
CALCIUM: 9.2 mg/dL (ref 8.9–10.3)
CO2: 27 mmol/L (ref 22–32)
CREATININE: 0.63 mg/dL (ref 0.44–1.00)
Chloride: 107 mmol/L (ref 101–111)
GFR calc Af Amer: >60 mL/min (ref >60)
GFR calc non Af Amer: >60 mL/min (ref >60)
GLUCOSE: 82 mg/dL (ref 65–99)
Potassium: 4.4 mmol/L (ref 3.5–5.1)
SODIUM: 142 mmol/L (ref 135–145)
TOTAL PROTEIN: 7.4 g/dL (ref 6.5–8.1)

## 2016-10-29 LAB — URINE DRUG SCREEN, QUALITATIVE (ARMC ONLY)
Amphetamines, Ur Screen: NOT DETECTED
BENZODIAZEPINE, UR SCRN: NOT DETECTED
Barbiturates, Ur Screen: NOT DETECTED
CANNABINOID 50 NG, UR ~~LOC~~: NOT DETECTED
Cocaine Metabolite,Ur ~~LOC~~: NOT DETECTED
MDMA (Ecstasy)Ur Screen: NOT DETECTED
Methadone Scn, Ur: NOT DETECTED
Opiate, Ur Screen: NOT DETECTED
PHENCYCLIDINE (PCP) UR S: NOT DETECTED
TRICYCLIC, UR SCREEN: NOT DETECTED

## 2016-10-29 LAB — URINALYSIS, COMPLETE (UACMP) WITH MICROSCOPIC
Bacteria, UA: NONE SEEN
Bilirubin Urine: NEGATIVE
GLUCOSE, UA: NEGATIVE mg/dL
HGB URINE DIPSTICK: NEGATIVE
Ketones, ur: NEGATIVE mg/dL
NITRITE: NEGATIVE
PH: 5 (ref 5.0–8.0)
PROTEIN: NEGATIVE mg/dL
Specific Gravity, Urine: 1.014 (ref 1.005–1.030)

## 2016-10-29 LAB — ETHANOL: Alcohol, Ethyl (B): <5 mg/dL (ref <5)

## 2016-10-29 LAB — SALICYLATE LEVEL

## 2016-10-29 LAB — ACETAMINOPHEN LEVEL: Acetaminophen (Tylenol), Serum: <10 ug/mL — ABNORMAL LOW (ref 10–30)

## 2016-10-29 MED ORDER — CEPHALEXIN 500 MG PO CAPS: 500.0000 mg | ORAL_CAPSULE | Freq: Three times a day (TID) | ORAL | 0 refills | Status: DC

## 2016-10-29 MED ORDER — LIDOCAINE HCL (PF) 1 % IJ SOLN
INTRAMUSCULAR | Status: AC
  Filled 2016-10-29: qty 5

## 2016-10-29 MED ORDER — CEFTRIAXONE SODIUM 1 G IJ SOLR
1.0000 g | Freq: Once | INTRAMUSCULAR | Status: AC
  Administered 2016-10-29: 1 g via INTRAMUSCULAR
  Filled 2016-10-29: qty 10

## 2016-10-29 NOTE — Discharge Instructions (Signed)
Please seek medical attention for any high fevers, chest pain, shortness of breath, change in behavior, persistent vomiting, bloody stool or any other new or concerning symptoms.  

## 2016-10-29 NOTE — ED Triage Notes (Signed)
Pt brought in by [facility] for auditory hallucinations.  Caregiver reports will try and go towards voices. This has been for past day or two and has not wanted to eat much which is abnormal for her.  Does have dementia with with behavioral tendencies. Has been taking medications.  Has not heard voices prior to this episode.

## 2016-10-29 NOTE — ED Notes (Signed)
FIRST NURSE NOTE: Pt caregiver states that pt has hx/o dementia. Today pts behavior has changed, pt has not been talking as much as normal. Caregiver also states that pt is hearing voices and "wanting to go toward them". Pt is calm and cooperative at this time.

## 2016-10-29 NOTE — ED Triage Notes (Signed)
All belongings given to [facility] , [place].  Dressed out by this Therapist, sports and tammy EMP

## 2016-10-29 NOTE — ED Provider Notes (Signed)
[facility] Emergency Department Provider Note   ____________________________________________   I have reviewed the triage vital signs and the nursing notes.   HISTORY  Chief Complaint Hallucinations   History limited by: Dementia, some history obtained from caregiver   HPI Jamie Boyd is a 66 y.o. female who presents to the emergency department today because of concern for hallucinations. The patient states that she is hearing voices. She states that this started yesterday. The patient denies knowing the voices, and denies that they are telling her to hurt herself or others. Caregivers state that the patient was trying to walk out of the house because of what the voices were telling her. The caregivers were also stating that they felt like the patient was not as active as she has normally been the past couple of days. She does have a history of dementia and they think this might be a progression of that process. They also state she has had UTIs in the past. Patient does endorse painful urination.    Past Medical History:  Diagnosis Date  . Anemia   . Dementia   . Goiter   . Memory deficit     Patient Active Problem List   Diagnosis Date Noted  . Agitation 11/23/2015  . Moderate dementia, with behavioral disturbance 11/23/2015  . Gastroesophageal reflux disease without esophagitis 09/11/2015  . Iron deficiency anemia due to chronic blood loss 06/02/2015  . GERD without esophagitis 06/01/2015  . Difficulty sleeping 04/27/2015  . Moderate dementia without behavioral disturbance 03/01/2015  . Acute UTI 01/29/2015  . Anemia 01/29/2015  . Memory deficit 01/29/2015    Past Surgical History:  Procedure Laterality Date  . COLONOSCOPY WITH PROPOFOL N/A 02/18/2015   Procedure: COLONOSCOPY WITH PROPOFOL;  Surgeon: Hulen Luster, MD;  Location: [facility] ENDOSCOPY;  Service: Gastroenterology;  Laterality: N/A;  . ESOPHAGOGASTRODUODENOSCOPY N/A 02/18/2015   Procedure:  ESOPHAGOGASTRODUODENOSCOPY (EGD);  Surgeon: Hulen Luster, MD;  Location: [facility] ENDOSCOPY;  Service: Gastroenterology;  Laterality: N/A;    Prior to Admission medications   Medication Sig Start Date End Date Taking? Authorizing Provider  azelastine (ASTELIN) 0.1 % nasal spray Place into the nose. 07/11/16 07/11/17  [provider]  diclofenac (VOLTAREN) 50 MG EC tablet Take 1 tablet (50 mg total) by mouth 2 (two) times daily. 06/22/16   Menshew, Dannielle Karvonen, PA-C  Disposable Gloves (RA LATEX MEDICAL GLOVES) MISC Use 1 Box 3 (three) times daily as needed. 09/13/15   [provider]  divalproex (DEPAKOTE) 250 MG DR tablet Take by mouth. 11/23/15   [provider]  donepezil (ARICEPT) 5 MG tablet Take 5 mg by mouth at bedtime.    [provider]  ferrous sulfate 325 (65 FE) MG EC tablet Take 325 mg by mouth 3 (three) times daily with meals.    [provider]  fexofenadine (ALLEGRA) 180 MG tablet Take by mouth. 09/13/15 09/12/16  [provider]  Incontinence Supply Disposable (ENTRUST PLUS BRIEFS MEDIUM) MISC Use 100 each 4 (four) times daily. 10/04/15   [provider]  isosorbide mononitrate (IMDUR) 30 MG 24 hr tablet Take by mouth. 11/10/15 11/09/16  [provider]  memantine (NAMENDA) 10 MG tablet Take by mouth. 11/23/15 11/22/16  [provider]  metoprolol succinate (TOPROL-XL) 25 MG 24 hr tablet Take by mouth. 11/10/15 11/09/16  [provider]  Misc. Devices (AIRGO ROLLING WALKER) MISC 1 Units by Does not apply route as directed. 06/21/15   Harvest Dark, MD  montelukast (SINGULAIR) 10 MG tablet Take by mouth. 08/05/15 08/04/16  [provider]  Multiple Vitamins-Minerals (CENTRUM SILVER ULTRA WOMENS PO) Take 1 tablet by mouth daily.     [provider]  QUEtiapine (SEROQUEL) 100 MG tablet Take 1.5 tabs at night 11/23/15   [provider]  sucralfate (CARAFATE) 1 g tablet Take by mouth.  09/10/15 09/09/16  [provider]  sulfamethoxazole-trimethoprim (BACTRIM DS,SEPTRA DS) 800-160 MG tablet Take 1 tablet by mouth 2 (two) times daily. 07/13/16   Defrancesco, Alanda Slim, MD  traZODone (DESYREL) 100 MG tablet Take 1/2 tab at night for a week then increase to 1 whole tab and continue that dose 04/11/16   [provider]    Allergies Patient has no known allergies.  Family History  Problem Relation Age of Onset  . Prostate cancer Neg Hx   . Bladder Cancer Neg Hx   . Kidney cancer Neg Hx     Social History Social History  Substance Use Topics  . Smoking status: Former Smoker    Types: Cigarettes    Quit date: 01/25/2014  . Smokeless tobacco: Never Used  . Alcohol use No    Review of Systems Constitutional: No fever/chills Eyes: No visual changes. ENT: No sore throat. Cardiovascular: Denies chest pain. Respiratory: Denies shortness of breath. Gastrointestinal: No abdominal pain.  No nausea, no vomiting.  No diarrhea.   Genitourinary: Positive for dysuria.  Musculoskeletal: Negative for back pain. Skin: Negative for rash. Neurological: Negative for headaches, focal weakness or numbness. Psychiatric: Positive for auditory hallucinations ____________________________________________   PHYSICAL EXAM:  VITAL SIGNS: ED Triage Vitals  Enc Vitals Group     BP 10/29/16 1709 130/68     Pulse Rate 10/29/16 1709 (!) 57     Resp 10/29/16 1709 18     Temp 10/29/16 1707 98.1 F (36.7 C)     Temp Source 10/29/16 1707 Oral     SpO2 10/29/16 1709 100 %     Weight 10/29/16 1707 135 lb (61.2 kg)     Height 10/29/16 1707 5\' 4"  (1.626 m)   Constitutional: Alert. Not oriented to place or time. Well appearing and in no distress. Eyes: Conjunctivae are normal.  ENT   Head: Normocephalic and atraumatic.   Nose: No congestion/rhinnorhea.   Mouth/Throat: Mucous membranes are moist.   Neck: No stridor. Hematological/Lymphatic/Immunilogical: No cervical  lymphadenopathy. Cardiovascular: Normal rate, regular rhythm.  No murmurs, rubs, or gallops.  Respiratory: Normal respiratory effort without tachypnea nor retractions. Breath sounds are clear and equal bilaterally. No wheezes/rales/rhonchi. Gastrointestinal: Soft and non tender. No rebound. No guarding.  Genitourinary: Deferred Musculoskeletal: Normal range of motion in all extremities. No lower extremity edema. Neurologic:  Normal speech and language. No gross focal neurologic deficits are appreciated.  Skin:  Skin is warm, dry and intact. No rash noted. Psychiatric: Mood and affect are normal. Speech and behavior are normal. Patient exhibits appropriate insight and judgment.  ____________________________________________    LABS (pertinent positives/negatives)  UA Color: yellow Appearance: clear Hgb: neg Ketones: neg Nitrite: neg Leukocytes: mod RBC: 0-5 WBC: 6-30 Bacteria: none Squamous Epi: 0-5 CBC WBC: 9.9 Hgb: 11.6 Plt: 229 CMP wnl   ____________________________________________   EKG  None  ____________________________________________    RADIOLOGY  None  ____________________________________________   PROCEDURES  Procedures  ____________________________________________   INITIAL IMPRESSION / ASSESSMENT AND PLAN / ED COURSE  Pertinent labs & imaging results that were available during my care of the patient were reviewed by me and  considered in my medical decision making (see chart for details).  patient presented to the emergency department today from home because of concerns for altered mental status. Workup is consistent with a urinary tract infection. I'm do think this could explain the patient's hallucinations that she could be slightly delirious. Discussed with family that otherwise blood work looks good and reassuring. Family felt comfortable giving patient dose of antibiotics here in the emergency department and discharging home. I do think this  is reasonable. Did discuss with family return precautions. Will give dose of Rocephin here in the emergency discharge with Keflex to treat urinary tract infection. Family states she has appointment already scheduled next week with her dementia doctor. ____________________________________________   FINAL CLINICAL IMPRESSION(S) / ED DIAGNOSES  Final diagnoses:  Lower urinary tract infection  Hallucinations     Note: This dictation was prepared with Dragon dictation. Any transcriptional errors that result from this process are unintentional     Nance Pear, MD 10/29/16 1932

## 2016-10-31 LAB — URINE CULTURE

## 2016-11-27 ENCOUNTER — Emergency Department
Admission: EM | Admit: 2016-11-27 | Discharge: 2016-11-27 | Disposition: A | Discharge status: Home / Self Care | Payer: Medicare Other | Attending: Emergency Medicine | Admitting: Emergency Medicine

## 2016-11-27 ENCOUNTER — Encounter: Payer: Self-pay | Admitting: Emergency Medicine

## 2016-11-27 DIAGNOSIS — H5789 Other specified disorders of eye and adnexa: Secondary | ICD-10-CM

## 2016-11-27 DIAGNOSIS — F0391 Unspecified dementia with behavioral disturbance: Secondary | ICD-10-CM | POA: Insufficient documentation

## 2016-11-27 DIAGNOSIS — D649 Anemia, unspecified: Secondary | ICD-10-CM | POA: Diagnosis not present

## 2016-11-27 DIAGNOSIS — M7989 Other specified soft tissue disorders: Secondary | ICD-10-CM | POA: Diagnosis not present

## 2016-11-27 DIAGNOSIS — Z87891 Personal history of nicotine dependence: Secondary | ICD-10-CM | POA: Insufficient documentation

## 2016-11-27 DIAGNOSIS — R600 Localized edema: Secondary | ICD-10-CM | POA: Diagnosis present

## 2016-11-27 LAB — COMPREHENSIVE METABOLIC PANEL
ALBUMIN: 3.1 g/dL — AB (ref 3.5–5.0)
ALT: 23 U/L (ref 14–54)
AST: 31 U/L (ref 15–41)
Alkaline Phosphatase: 47 U/L (ref 38–126)
Anion gap: 7 (ref 5–15)
BUN: 14 mg/dL (ref 6–20)
CHLORIDE: 108 mmol/L (ref 101–111)
CO2: 28 mmol/L (ref 22–32)
CREATININE: 0.86 mg/dL (ref 0.44–1.00)
Calcium: 8.8 mg/dL — ABNORMAL LOW (ref 8.9–10.3)
GFR calc Af Amer: >60 mL/min (ref >60)
GLUCOSE: 110 mg/dL — AB (ref 65–99)
POTASSIUM: 3.8 mmol/L (ref 3.5–5.1)
SODIUM: 143 mmol/L (ref 135–145)
Total Bilirubin: 0.5 mg/dL (ref 0.3–1.2)
Total Protein: 6.4 g/dL — ABNORMAL LOW (ref 6.5–8.1)

## 2016-11-27 LAB — CBC
HCT: 31.7 % — ABNORMAL LOW (ref 35.0–47.0)
HEMOGLOBIN: 10.8 g/dL — AB (ref 12.0–16.0)
MCH: 32.1 pg (ref 26.0–34.0)
MCHC: 33.9 g/dL (ref 32.0–36.0)
MCV: 94.7 fL (ref 80.0–100.0)
Platelets: 217 10*3/uL (ref 150–440)
RBC: 3.35 MIL/uL — AB (ref 3.80–5.20)
RDW: 13.9 % (ref 11.5–14.5)
WBC: 7.2 10*3/uL (ref 3.6–11.0)

## 2016-11-27 LAB — BRAIN NATRIURETIC PEPTIDE: B NATRIURETIC PEPTIDE 5: 63 pg/mL (ref 0.0–100.0)

## 2016-11-27 NOTE — ED Triage Notes (Signed)
Caregiver says pt woke up with swelling all over.  Hands, face.  Her right hand does not look swollen.  Legs do not look swollen.  No new meds.  Pt in nad with no pain.  Has dementia and is at baseline.

## 2016-11-27 NOTE — ED Notes (Signed)
AAOx3.  Skin warm and dry.  NAD 

## 2016-11-27 NOTE — ED Provider Notes (Addendum)
[facility] Emergency Department Provider Note  ____________________________________________  Time seen: Approximately 11:41 AM  I have reviewed the triage vital signs and the nursing notes.   HISTORY  Chief Complaint Facial Swelling  History is limited due to patient dementia.  HPI Jamie Boyd is a 66 y.o. female with a history of advanced dementia, chronic anemia, presenting with facial swelling and hand swelling. The patient is demented and unable to give any history. Herbrother, who is her caretaker and with whom she lives, gives the history. He states that she woke up and had swelling under both eyes without ecchymosis or trauma, and swelling of the hands. The swelling of the hands is completely resolved and the facial swelling has improved without any intervention. The patient is not on an ACE inhibitor, and she has not had any new medications, foods,soaps or detergents or any trauma.   Past Medical History:  Diagnosis Date  . Anemia   . Dementia   . Goiter   . Memory deficit     Patient Active Problem List   Diagnosis Date Noted  . Agitation 11/23/2015  . Moderate dementia, with behavioral disturbance 11/23/2015  . Gastroesophageal reflux disease without esophagitis 09/11/2015  . Iron deficiency anemia due to chronic blood loss 06/02/2015  . GERD without esophagitis 06/01/2015  . Difficulty sleeping 04/27/2015  . Moderate dementia without behavioral disturbance 03/01/2015  . Acute UTI 01/29/2015  . Anemia 01/29/2015  . Memory deficit 01/29/2015    Past Surgical History:  Procedure Laterality Date  . COLONOSCOPY WITH PROPOFOL N/A 02/18/2015   Procedure: COLONOSCOPY WITH PROPOFOL;  Surgeon: Hulen Luster, MD;  Location: [facility] ENDOSCOPY;  Service: Gastroenterology;  Laterality: N/A;  . ESOPHAGOGASTRODUODENOSCOPY N/A 02/18/2015   Procedure: ESOPHAGOGASTRODUODENOSCOPY (EGD);  Surgeon: Hulen Luster, MD;  Location: [facility] ENDOSCOPY;  Service:  Gastroenterology;  Laterality: N/A;    Current Outpatient Rx  . Order #: [id number] Class: Historical Med  . Order #: [id number] Class: Print  . Order #: [id number] Class: Print  . Order #: [id number] Class: Historical Med  . Order #: [id number] Class: Historical Med  . Order #: [id number] Class: Historical Med  . Order #: [id number] Class: Historical Med  . Order #: [id number] Class: Historical Med  . Order #: [id number] Class: Historical Med  . Order #: [id number] Class: Historical Med  . Order #: [id number] Class: Historical Med  . Order #: [id number] Class: Historical Med  . Order #: [id number] Class: Print  . Order #: [id number] Class: Historical Med  . Order #: [id number] Class: Historical Med  . Order #: [id number] Class: Historical Med  . Order #: [id number] Class: Historical Med  . Order #: [id number] Class: Normal  . Order #: [id number] Class: Historical Med    Allergies Patient has no known allergies.  Family History  Problem Relation Age of Onset  . Prostate cancer Neg Hx   . Bladder Cancer Neg Hx   . Kidney cancer Neg Hx     Social History Social History  Substance Use Topics  . Smoking status: Former Smoker    Types: Cigarettes    Quit date: 01/25/2014  . Smokeless tobacco: Never Used  . Alcohol use No    Review of Systems Constitutional: No fever/chills.no lightheadedness or syncope.no trauma. Eyes: No visual changes. ENT: No sore throat. No congestion or rhinorrhea. Cardiovascular: Denies chest pain. Denies palpitations. Respiratory: Denies shortness of breath.  No cough. Gastrointestinal: No abdominal pain.  No nausea, no vomiting.  No diarrhea.  No constipation. Genitourinary: Negative for dysuria. Musculoskeletal: Negative for back pain. Skin: Negative for rash.positive facial swelling  below theeyes bilaterally. Positive bilateral hand swelling. Neurological: Negative for headaches. No focal numbness, tingling or weakness. No changes in mental  status.    ____________________________________________   PHYSICAL EXAM:  VITAL SIGNS: ED Triage Vitals [11/27/16 0958]  Enc Vitals Group     BP (!) 110/98     Pulse Rate 68     Resp 14     Temp 98.2 F (36.8 C)     Temp Source Oral     SpO2 100 %     Weight 135 lb (61.2 kg)     Height 5\' 4"  (1.626 m)     Head Circumference      Peak Flow      Pain Score      Pain Loc      Pain Edu?      Excl. in [place]?     Constitutional: Alert and oriented. Chronically ill appearing but in no acute distress. Answers questions appropriately. Eyes: Conjunctivae are normal.  EOMI. No scleral icterus. The patient has some animal edema below both eyes which is mostly unremarkable. There is no associated raccoon eyes or ecchymosis, no overlying erythema or warmth. Head: Atraumatic. Nose: No congestion/rhinnorhea.no swelling over the nose. Mouth/Throat: Mucous membranes are moist. No swelling of the lips, tongue. The posterior palate is symmetric and uvula is midline. The patient has no stridor, hoarse voice or difficulty maintaining her secretions. The submandibular space is soft. She has no submandibular or posterior chains lymphadenopathy. Neck: No stridor.  Supple.  No JVD. No meningismus. Cardiovascular: Normal rate, regular rhythm. No murmurs, rubs or gallops.  Respiratory: Normal respiratory effort.  No accessory muscle use or retractions. Lungs CTAB.  No wheezes, rales or ronchi. Gastrointestinal: Soft, nontender and nondistended.  No guarding or rebound.  No peritoneal signs. Musculoskeletal: no edema of the upper or lower extremities.No LE edema. No ttp in the calves or palpable cords.  Negative Homan's sign. Neurologic:  A&Ox3.  Speech is clear.  Face and smile are symmetric.  EOMI.  Moves all extremities well. Skin:  Skin is warm, dry and intact. No rash noted. Psychiatric: Mood and affect are normal. Speech and behavior are normal.  Normal  judgement.  ____________________________________________   LABS (all labs ordered are listed, but only abnormal results are displayed)  Labs Reviewed  COMPREHENSIVE METABOLIC PANEL - Abnormal; Notable for the following:       Result Value   Glucose, Bld 110 (*)    Calcium 8.8 (*)    Total Protein 6.4 (*)    Albumin 3.1 (*)    All other components within normal limits  CBC - Abnormal; Notable for the following:    RBC 3.35 (*)    Hemoglobin 10.8 (*)    HCT 31.7 (*)    All other components within normal limits  BRAIN NATRIURETIC PEPTIDE   ____________________________________________  EKG  Not indicated ____________________________________________  RADIOLOGY  No results found.  ____________________________________________   PROCEDURES  Procedure(s) performed: None  Procedures  Critical Care performed: No ____________________________________________   INITIAL IMPRESSION / ASSESSMENT AND PLAN / ED COURSE  Pertinent labs & imaging results that were available during my care of the patient were reviewed by me and considered in my medical decision making (see chart for details).  66 y.o. female with a history of dementia, with no new exposures of medications foods or otherwise, presenting with swelling under the eyes and swelling of the hands bilaterally, now resolving. Overall, the patient is medically stable. I do  not see any evidence of angioedema today. She has no evidence of airway compromise.There is no evidence of acute skin infection or cellulitis. The etiology of the patient's swelling is unclear although there may be some vascular congestion from lying down overnight. I will get a BNP; the patient's creatinine is normal. We will continue to monitor the patient but if she continues to improve and her symptomatology, the patient will be safe for discharge home. I discussed return precautions as well as follow-up instructions with her  son.  ----------------------------------------- 12:35 PM on 11/27/2016 -----------------------------------------  At this time, the swelling has completely resolved. Her laboratory studies are reassuring. We'll plan to discharge her home at this time.  ____________________________________________  FINAL CLINICAL IMPRESSION(S) / ED DIAGNOSES  Final diagnoses:  Periorbital swelling  Bilateral hand swelling         NEW MEDICATIONS STARTED DURING THIS VISIT:  New Prescriptions   No medications on file      Eula Listen, MD 11/27/16 1147    Eula Listen, MD 11/27/16 1236

## 2016-11-27 NOTE — Discharge Instructions (Signed)
The cause of your swelling today's unclear. Please follow-up with your primary care physician tomorrow for reevaluation.  Return to the emergency department for severe pain, lightheadedness or fainting, fever, increasing swelling, difficulty breathing or swallowing, or any other symptoms concerning to you.

## 2016-11-27 NOTE — ED Notes (Signed)
Patient denies pain and is resting comfortably.  

## 2016-12-27 ENCOUNTER — Ambulatory Visit (INDEPENDENT_AMBULATORY_CARE_PROVIDER_SITE_OTHER): Payer: 59 | Admitting: Obstetrics and Gynecology

## 2016-12-27 ENCOUNTER — Encounter: Payer: Self-pay | Admitting: Obstetrics and Gynecology

## 2016-12-27 VITALS — BP 95/60 | HR 56 | Ht 64.0 in | Wt 136.2 lb

## 2016-12-27 DIAGNOSIS — N390 Urinary tract infection, site not specified: Secondary | ICD-10-CM

## 2016-12-27 DIAGNOSIS — R300 Dysuria: Secondary | ICD-10-CM

## 2016-12-27 DIAGNOSIS — N3945 Continuous leakage: Secondary | ICD-10-CM | POA: Diagnosis not present

## 2016-12-27 LAB — POCT URINALYSIS DIPSTICK
Bilirubin, UA: NEGATIVE
Glucose, UA: NEGATIVE
KETONES UA: NEGATIVE
NITRITE UA: NEGATIVE
PH UA: 7.5 (ref 5.0–8.0)
PROTEIN UA: NEGATIVE
Spec Grav, UA: 1.01 (ref 1.010–1.025)
Urobilinogen, UA: 0.2 E.U./dL

## 2016-12-27 MED ORDER — SULFAMETHOXAZOLE-TRIMETHOPRIM 800-160 MG PO TABS: 1.0000 | ORAL_TABLET | Freq: Two times a day (BID) | ORAL | 0 refills | Status: DC

## 2016-12-27 NOTE — Patient Instructions (Signed)
1.  Urine culture is sent 2.  Septra DS twice daily for 7 days 3.  Urology referral for urinary incontinence and recurrent UTI

## 2016-12-27 NOTE — Progress Notes (Signed)
Chief complaint: 1.  Possible UTI 2.  History of urinary frequency 3.  History of urinary incontinence 4.  History of recurrent UTI  The patient presents today for evaluation of possible recurrent UTI. History of worsening dementia Patient is not having increasing episodes of urinary incontinence along with frequency and urgency. She denies fevers chills or sweats.  She denies flank pain.  Urinalysis today is notable for 2+ leukocyte esterase, negative nitrites, small blood  10/29/2016 urine culture-multiple species present 07/13/2016 urine culture-less than 10,000 colonies 06/22/2016 urine culture-multiple species 05/17/2016 urine culture-less than 10,000 colonies 04/04/2016 urine culture-50-100,000 Klebsiella pneumoniae 02/14/2016 urine culture-less than 10,000 colonies 11/08/2015 urine culture-multiple species  Past Medical History:  Diagnosis Date  . Anemia   . Dementia   . Goiter   . Memory deficit     Past Surgical History:  Procedure Laterality Date  . COLONOSCOPY WITH PROPOFOL N/A 02/18/2015   Procedure: COLONOSCOPY WITH PROPOFOL;  Surgeon: Hulen Luster, MD;  Location: [facility] ENDOSCOPY;  Service: Gastroenterology;  Laterality: N/A;  . ESOPHAGOGASTRODUODENOSCOPY N/A 02/18/2015   Procedure: ESOPHAGOGASTRODUODENOSCOPY (EGD);  Surgeon: Hulen Luster, MD;  Location: [facility] ENDOSCOPY;  Service: Gastroenterology;  Laterality: N/A;    OBJECTIVE: BP 95/60   Pulse (!) 56   Ht 5\' 4"  (1.626 m)   Wt 136 lb 3.2 oz (61.8 kg)   BMI 23.38 kg/m  Pleasant female in no acute distress.  Limited intellectual faculties makes it difficult for her to follow direction Back: No CVA tenderness Abdomen: Soft, nontender Bladder: Nontender Pelvic exam: External genitalia-normal BUS-normal Vagina-minimal white secretions in vault; no lesions; no palpable masses or tenderness Rectovaginal-normal external exam; minimal liquid fecal secretions present  ASSESSMENT: 1.  Possible UTI with abnormal  urinalysis 2.  History of urinary incontinence, worsening 3.  History of recurrent UTI 4.  Dementia  PLAN: 1.  Recommend increase water intake and cranberry tablet intake 2.  Urine culture is sent 3.  Recommend urology referral for further recommendations regarding management of urinary incontinence and prevention of UTI.  A total of 15 minutes were spent face-to-face with the patient during this encounter and over half of that time dealt with counseling and coordination of care.  Brayton Mars, MD  Note: This dictation was prepared with Dragon dictation along with smaller phrase technology. Any transcriptional errors that result from this process are unintentional.

## 2016-12-29 LAB — URINE CULTURE

## 2017-01-05 NOTE — Progress Notes (Signed)
01/08/2017 2:39 PM   Jamie Boyd [birth year] [id number]  Referring provider: Sharyne Peach, MD [place],  [address]  Chief Complaint  Patient presents with  . Urinary Tract Infection    x2 weeks, burning, pain in lower back, leaking    HPI: Patient is a 66 -year-old [place] American female who is referred to [place] by, Dr. Enzo Bi, for recurrent urinary tract infections with her sister in law, Lattie Haw.    Patient states that she has had three urinary tract infections over the last year.  Reviewing her records,  she has had several cultures positive for multiple species.    Her symptoms with a urinary tract infection consist of dysuria and mid back pain.  She endorses dysuria, gross hematuria, suprapubic pain and  back pain.  She has not had any recent fevers, chills, nausea or vomiting.   She does have a history of nephrolithiasis, she has not had GU surgery or GU trauma.  She is not sexually active.  She not postmenopausal.     She denies constipation and/or diarrhea.   She does not engage in good perineal hygiene. She does not take tub baths.    She has incontinence.  She is having SUI and UI.  She is using incontinence pads x 5.   CATH UA was negative.  PVR is 20 mL.    She is not having pain with bladder filling.    CT Renal stone study performed in 11/2015 noted a left lower pole non obstructing 11 mm stone.    She is drinking 2 bottles of water daily.  She is not taking cranberry tablets, probiotics or Vitamin C.      Reviewed referral notes.    PMH: Past Medical History:  Diagnosis Date  . Anemia   . Dementia   . Goiter   . Memory deficit     Surgical History: Past Surgical History:  Procedure Laterality Date  . COLONOSCOPY WITH PROPOFOL N/A 02/18/2015   Procedure: COLONOSCOPY WITH PROPOFOL;  Surgeon: Hulen Luster, MD;  Location: [facility] ENDOSCOPY;  Service: Gastroenterology;  Laterality: N/A;  . ESOPHAGOGASTRODUODENOSCOPY N/A 02/18/2015   Procedure: ESOPHAGOGASTRODUODENOSCOPY (EGD);  Surgeon: Hulen Luster, MD;  Location: [facility] ENDOSCOPY;  Service: Gastroenterology;  Laterality: N/A;    Home Medications:  Allergies as of 01/08/2017      Reactions   Pollen Extract Other (See Comments)      Medication List        Accurate as of 01/08/17  2:39 PM. Always use your most recent med list.          CENTRUM SILVER ULTRA WOMENS PO Take 1 tablet by mouth daily.   conjugated estrogens vaginal cream Commonly known as:  PREMARIN Place 1 Applicatorful vaginally daily. Apply 0.'5mg'$  (pea-sized amount)  just inside the vaginal introitus with a finger-tip every night for two weeks and then Monday, Wednesday and Friday nights.   divalproex 250 MG DR tablet Commonly known as:  DEPAKOTE Take 250 mg by mouth 3 (three) times daily.   donepezil 10 MG tablet Commonly known as:  ARICEPT Take 10 mg by mouth at bedtime.   estradiol 0.1 MG/GM vaginal cream Commonly known as:  ESTRACE VAGINAL Apply 0.'5mg'$  (pea-sized amount)  just inside the vaginal introitus with a finger-tip every night for two weeks and then Monday, Wednesday and Friday nights.   ferrous sulfate 325 (65 FE) MG EC tablet Take 325 mg by mouth 3 (three) times daily with  meals.   fexofenadine 180 MG tablet Commonly known as:  ALLEGRA Take 180 mg by mouth daily.   isosorbide mononitrate 30 MG 24 hr tablet Commonly known as:  IMDUR Take 30 mg by mouth daily.   memantine 10 MG tablet Commonly known as:  NAMENDA Take 10 mg by mouth 2 (two) times daily.   metoprolol succinate 25 MG 24 hr tablet Commonly known as:  TOPROL-XL Take 12.5 mg by mouth daily.   QUEtiapine 25 MG tablet Commonly known as:  SEROQUEL Take 125 mg by mouth at bedtime   traZODone 100 MG tablet Commonly known as:  DESYREL Take 100 mg nightly at bedtime       Allergies:  Allergies  Allergen Reactions  . Pollen Extract Other (See Comments)    Family History: Family History  Problem Relation  Age of Onset  . Prostate cancer Neg Hx   . Bladder Cancer Neg Hx   . Kidney cancer Neg Hx     Social History:  reports that she quit smoking about 2 years ago. Her smoking use included cigarettes. she has never used smokeless tobacco. She reports that she does not drink alcohol or use drugs.  ROS: UROLOGY Frequent Urination?: Yes Hard to postpone urination?: Yes Burning/pain with urination?: Yes Get up at night to urinate?: No Leakage of urine?: No Urine stream starts and stops?: No Trouble starting stream?: Yes Do you have to strain to urinate?: Yes Blood in urine?: No Urinary tract infection?: Yes Sexually transmitted disease?: No Injury to kidneys or bladder?: No Painful intercourse?: No Weak stream?: No Currently pregnant?: No Vaginal bleeding?: No Last menstrual period?: n  Gastrointestinal Nausea?: No Vomiting?: No Indigestion/heartburn?: No Diarrhea?: No Constipation?: No  Constitutional Fever: No Night sweats?: No Weight loss?: No Fatigue?: No  Skin Skin rash/lesions?: No Itching?: No  Eyes Blurred vision?: No Double vision?: No  Ears/Nose/Throat Sore throat?: No Sinus problems?: No  Hematologic/Lymphatic Swollen glands?: No Easy bruising?: No  Cardiovascular Leg swelling?: Yes Chest pain?: No  Respiratory Cough?: No Shortness of breath?: No  Endocrine Excessive thirst?: No  Musculoskeletal Back pain?: Yes Joint pain?: No  Neurological Headaches?: No Dizziness?: No  Psychologic Depression?: No Anxiety?: No  Physical Exam: BP 101/65   Pulse 65   Ht '5\' 4"'$  (1.626 m)   BMI 23.38 kg/m   Constitutional: Well nourished. Alert and oriented, No acute distress. HEENT: [place] AT, moist mucus membranes. Trachea midline, no masses. Cardiovascular: No clubbing, cyanosis, or edema. Respiratory: Normal respiratory effort, no increased work of breathing. GI: Abdomen is soft, non tender, non distended, no abdominal masses. Liver and spleen  not palpable.  No hernias appreciated.  Stool sample for occult testing is not indicated.   GU: No CVA tenderness.  No bladder fullness or masses.   Skin: No rashes, bruises or suspicious lesions. Lymph: No cervical or inguinal adenopathy. Neurologic: Grossly intact, no focal deficits, moving all 4 extremities. Psychiatric: Normal mood and affect.  Laboratory Data: Lab Results  Component Value Date   WBC 7.2 11/27/2016   HGB 10.8 (L) 11/27/2016   HCT 31.7 (L) 11/27/2016   MCV 94.7 11/27/2016   PLT 217 11/27/2016    Lab Results  Component Value Date   CREATININE 0.86 11/27/2016    Lab Results  Component Value Date   AST 31 11/27/2016   Lab Results  Component Value Date   ALT 23 11/27/2016    Urinalysis Negative.  See Epic.   I have reviewed the labs.  Assessment & Plan:    1. Recurrent UTI's  - criteria for recurrent UTI has not been met with 2 or more infections in 6 months or 3 or greater infections in one year   - Patient is instructed to increase their water intake until the urine is pale yellow or clear (10 to 12 cups daily)   - Patient is instructed to take probiotics (yogurt, oral pills or vaginal suppositories), take cranberry pills or drink the juice and Vitamin C 1,000 mg daily to acidify the urine should be added to their daily regimen   - avoid soaking in tubs and wipe front to back after urinating   - advised them to have CATH UA's for urinalysis and culture to prevent skin contamination of the specimen  - reviewed symptoms of UTI and advised not to have urine checked or be treated for UTI if not experiencing symptoms  - discussed antibiotic stewardship with the patient    2. Vaginal atrophy  - I explained to the patient that when women go through menopause and her estrogen levels are severely diminished, the normal vaginal flora will change.  This is due to an increase of the vaginal canal's pH. Because of this, the vaginal canal may be colonized by  bacteria from the rectum instead of the protective lactobacillus.  This accompanied by the loss of the mucus barrier with vaginal atrophy is a cause of recurrent urinary tract infections.  - In some studies, the use of vaginal estrogen cream has been demonstrated to reduce  recurrent urinary tract infections to one a year.   - Patient was given a sample of vaginal estrogen cream (Premarin) and instructed to apply 0.'5mg'$  (pea-sized amount)  just inside the vaginal introitus with a finger-tip on Monday, Wednesday and Friday nights.  I explained to the patient that vaginally administered estrogen, which causes only a slight increase in the blood estrogen levels, have fewer contraindications and adverse systemic effects that oral HT.  - I have also given prescriptions for the Estrace cream and Premarin cream, so that the patient may carry them to the pharmacy to see which one of the branded creams would be most economical for her.  If she finds both medications cost prohibitive, she is instructed to call the office.  We can then call in a compounded vaginal estrogen cream for the patient that may be more affordable.    - She will follow up in three months for an exam.    3. Incontinence  - offered behavioral therapies, bladder training, bladder control strategies and pelvic floor muscle training - patient with dementia  - fluid management - encouraged to drink 1.5 L of water daily  - offered beta-3 adrenergic receptor agonist and the potential side effects of each therapy   - would like to try the beta-3 adrenergic receptor agonist (Myrbetriq).  Given Myrbetriq 25 mg samples, #28.  I have reviewed with the patient of the side effects of Myrbetriq, such as: elevation in BP, urinary retention and/or HA.    - RTC in 3 weeks for PVR and symptom recheck                                            Return in about 3 weeks (around 01/29/2017) for PVR and OAB questionnaire.  These notes generated with voice  recognition software. I apologize for typographical errors.  Zara Council, [facility] [address] [place], [place] [address] [phone]

## 2017-01-08 ENCOUNTER — Encounter: Payer: Self-pay | Admitting: Urology

## 2017-01-08 ENCOUNTER — Other Ambulatory Visit: Payer: Self-pay

## 2017-01-08 ENCOUNTER — Ambulatory Visit (INDEPENDENT_AMBULATORY_CARE_PROVIDER_SITE_OTHER): Payer: 59 | Admitting: Urology

## 2017-01-08 VITALS — BP 101/65 | HR 65 | Ht 64.0 in

## 2017-01-08 DIAGNOSIS — N390 Urinary tract infection, site not specified: Secondary | ICD-10-CM | POA: Diagnosis not present

## 2017-01-08 DIAGNOSIS — N952 Postmenopausal atrophic vaginitis: Secondary | ICD-10-CM | POA: Diagnosis not present

## 2017-01-08 DIAGNOSIS — N3946 Mixed incontinence: Secondary | ICD-10-CM

## 2017-01-08 LAB — URINALYSIS, COMPLETE
Bilirubin, UA: NEGATIVE
Glucose, UA: NEGATIVE
LEUKOCYTES UA: NEGATIVE
Nitrite, UA: NEGATIVE
Protein, UA: NEGATIVE
RBC, UA: NEGATIVE
SPEC GRAV UA: 1.015 (ref 1.005–1.030)
Urobilinogen, Ur: 1 mg/dL (ref 0.2–1.0)
pH, UA: 6 (ref 5.0–7.5)

## 2017-01-08 LAB — MICROSCOPIC EXAMINATION
EPITHELIAL CELLS (NON RENAL): NONE SEEN /HPF (ref 0–10)
RBC, UA: NONE SEEN /hpf (ref 0–?)

## 2017-01-08 MED ORDER — ESTROGENS, CONJUGATED 0.625 MG/GM VA CREA: 1.0000 | TOPICAL_CREAM | Freq: Every day | VAGINAL | 12 refills | Status: DC

## 2017-01-08 MED ORDER — ESTRADIOL 0.1 MG/GM VA CREA
TOPICAL_CREAM | VAGINAL | 12 refills | Status: DC
Start: 2017-01-08 — End: 2017-02-26

## 2017-01-08 NOTE — Patient Instructions (Addendum)
Mirabegron extended-release tablets What is this medicine? MIRABEGRON (MIR a BEG ron) is used to treat overactive bladder. This medicine reduces the amount of bathroom visits. It may also help to control wetting accidents. This medicine may be used for other purposes; ask your health care provider or pharmacist if you have questions. COMMON BRAND NAME(S): Myrbetriq What should I tell my health care provider before I take this medicine? They need to know if you have any of these conditions: -difficulty passing urine -high blood pressure -kidney disease -liver disease -an unusual or allergic reaction to mirabegron, other medicines, foods, dyes, or preservatives -pregnant or trying to get pregnant -breast-feeding How should I use this medicine? Take this medicine by mouth with a glass of water. Follow the directions on the prescription label. Do not cut, crush or chew this medicine. You can take it with or without food. If it upsets your stomach, take it with food. Take your medicine at regular intervals. Do not take it more often than directed. Do not stop taking except on your doctor's advice. Talk to your pediatrician regarding the use of this medicine in children. Special care may be needed. Overdosage: If you think you have taken too much of this medicine contact a poison control center or emergency room at once. NOTE: This medicine is only for you. Do not share this medicine with others. What if I miss a dose? If you miss a dose, take it as soon as you can. If it is almost time for your next dose, take only that dose. Do not take double or extra doses. What may interact with this medicine? -certain medicines for bladder problems like fesoterodine, oxybutynin, solifenacin, tolterodine -desipramine -digoxin -flecainide -ketoconazole -MAOIs like Carbex, Eldepryl, Marplan, Nardil, and Parnate -metoprolol -propafenone -thioridazine -warfarin This list may not describe all possible  interactions. Give your health care provider a list of all the medicines, herbs, non-prescription drugs, or dietary supplements you use. Also tell them if you smoke, drink alcohol, or use illegal drugs. Some items may interact with your medicine. What should I watch for while using this medicine? It may take 8 weeks to notice the full benefit from this medicine. You may need to limit your intake tea, coffee, caffeinated sodas, and alcohol. These drinks may make your symptoms worse. Visit your doctor or health care professional for regular checks on your progress. Check your blood pressure as directed. Ask your doctor or health care professional what your blood pressure should be and when you should contact him or her. What side effects may I notice from receiving this medicine? Side effects that you should report to your doctor or health care professional as soon as possible: -allergic reactions like skin rash, itching or hives, swelling of the face, lips, or tongue -chest pain or palpitations -severe or sudden headache -high blood pressure -fast, irregular heartbeat -redness, blistering, peeling or loosening of the skin, including inside the mouth -signs of infection like fever or chills; cough; sore throat; pain or difficulty passing urine -trouble passing urine or change in the amount of urine Side effects that usually do not require medical attention (report to your doctor or health care professional if they continue or are bothersome): -constipation -diarrhea -dizziness -dry eyes -joint pain -mild headache -nausea -runny nose This list may not describe all possible side effects. Call your doctor for medical advice about side effects. You may report side effects to FDA at 1-800-FDA-1088. Where should I keep my medicine? Keep out of the reach   of children. Store at room temperature between 15 and 30 degrees C (59 and 86 degrees F). Throw away any unused medicine after the expiration  date. NOTE: This sheet is a summary. It may not cover all possible information. If you have questions about this medicine, talk to your doctor, pharmacist, or health care provider.  2018 Elsevier/Gold Standard (2015-02-25 12:14:30) Conjugated Estrogens vaginal cream What is this medicine? CONJUGATED ESTROGENS (CON ju gate ed ESS troe jenz) are a mixture of female hormones. This cream can help relieve symptoms associated with menopause.like vaginal dryness and irritation. This medicine may be used for other purposes; ask your health care provider or pharmacist if you have questions. COMMON BRAND NAME(S): Premarin What should I tell my health care provider before I take this medicine? They need to know if you have any of these conditions: -abnormal vaginal bleeding -blood vessel disease or blood clots -breast, cervical, endometrial, or uterine cancer -dementia -diabetes -gallbladder disease -heart disease or recent heart attack -high blood pressure -high cholesterol -high level of calcium in the blood -hysterectomy -kidney disease -liver disease -migraine headaches -protein C deficiency -protein S deficiency -stroke -systemic lupus erythematosus (SLE) -tobacco smoker -an unusual or allergic reaction to estrogens other medicines, foods, dyes, or preservatives -pregnant or trying to get pregnant -breast-feeding How should I use this medicine? This medicine is for use in the vagina only. Do not take by mouth. Follow the directions on the prescription label. Use at bedtime unless otherwise directed by your doctor or health care professional. Use the special applicator supplied with the cream. Wash hands before and after use. Fill the applicator with the cream and remove from the tube. Lie on your back, part and bend your knees. Insert the applicator into the vagina and push the plunger to expel the cream into the vagina. Wash the applicator with warm soapy water and rinse well. Use  exactly as directed for the complete length of time prescribed. Do not stop using except on the advice of your doctor or health care professional. Talk to your pediatrician regarding the use of this medicine in children. Special care may be needed. A patient package insert for the product will be given with each prescription and refill. Read this sheet carefully each time. The sheet may change frequently. Overdosage: If you think you have taken too much of this medicine contact a poison control center or emergency room at once. NOTE: This medicine is only for you. Do not share this medicine with others. What if I miss a dose? If you miss a dose, use it as soon as you can. If it is almost time for your next dose, use only that dose. Do not use double or extra doses. What may interact with this medicine? Do not take this medicine with any of the following medications: -aromatase inhibitors like aminoglutethimide, anastrozole, exemestane, letrozole, testolactone This medicine may also interact with the following medications: -barbiturates used for inducing sleep or treating seizures -carbamazepine -grapefruit juice -medicines for fungal infections like itraconazole and ketoconazole -raloxifene or tamoxifen -rifabutin -rifampin -rifapentine -ritonavir -some antibiotics used to treat infections -St. John's Wort -warfarin This list may not describe all possible interactions. Give your health care provider a list of all the medicines, herbs, non-prescription drugs, or dietary supplements you use. Also tell them if you smoke, drink alcohol, or use illegal drugs. Some items may interact with your medicine. What should I watch for while using this medicine? Visit your health care professional for regular checks  on your progress. You will need a regular breast and pelvic exam. You should also discuss the need for regular mammograms with your health care professional, and follow his or her  guidelines. This medicine can make your body retain fluid, making your fingers, hands, or ankles swell. Your blood pressure can go up. Contact your doctor or health care professional if you feel you are retaining fluid. If you have any reason to think you are pregnant; stop taking this medicine at once and contact your doctor or health care professional. Tobacco smoking increases the risk of getting a blood clot or having a stroke, especially if you are more than 67 years old. You are strongly advised not to smoke. If you wear contact lenses and notice visual changes, or if the lenses begin to feel uncomfortable, consult your eye care specialist. If you are going to have elective surgery, you may need to stop taking this medicine beforehand. Consult your health care professional for advice prior to scheduling the surgery. What side effects may I notice from receiving this medicine? Side effects that you should report to your doctor or health care professional as soon as possible: -allergic reactions like skin rash, itching or hives, swelling of the face, lips, or tongue -breast tissue changes or discharge -changes in vision -chest pain -confusion, trouble speaking or understanding -dark urine -general ill feeling or flu-like symptoms -light-colored stools -nausea, vomiting -pain, swelling, warmth in the leg -right upper belly pain -severe headaches -shortness of breath -sudden numbness or weakness of the face, arm or leg -trouble walking, dizziness, loss of balance or coordination -unusual vaginal bleeding -yellowing of the eyes or skin Side effects that usually do not require medical attention (report to your doctor or health care professional if they continue or are bothersome): -hair loss -increased hunger or thirst -increased urination -symptoms of vaginal infection like itching, irritation or unusual discharge -unusually weak or tired This list may not describe all possible side  effects. Call your doctor for medical advice about side effects. You may report side effects to FDA at 1-800-FDA-1088. Where should I keep my medicine? Keep out of the reach of children. Store at room temperature between 15 and 30 degrees C (59 and 86 degrees F). Throw away any unused medicine after the expiration date. NOTE: This sheet is a summary. It may not cover all possible information. If you have questions about this medicine, talk to your doctor, pharmacist, or health care provider.  2018 Elsevier/Gold Standard (2010-04-27 09:20:36)                                              Urinary Tract Infection Prevention Patient Education Stay Hydrated: Urinary tract infections (UTIs) are less likely to occur in someone who is drinking enough water to promote regular urination, so it is very important to stay hydrated in order to help flush out bacteria from the urinary tract. Respond to "Nature's Call": It is always a good idea to urinate as soon as you feel the need. While "holding it in" does not directly cause an infection, it can cause overdistension that can damage the lining of the bladder, making it more vulnerable to bacteria. Remove Tampons Before Going: Remember to always take out tampons before urinating, and change tampons often.  Practice Proper Bathroom Hygiene: To keep bacteria near the urethral opening to a minimum, it  is important to practice proper wiping techniques (i.e. front to back wiping) to help prevent rectal bacteria from entering the uretro-genital area. It can also be helpful to take showers and avoid soaking in the bathtub.  Take a Vitamin C Supplement: About 1,000 milligrams of vitamin C taken daily can help inhibit the growth of some bacteria by acidifying the urine. Maintain Control with Cranberries: Cranberries contain hippuronic acid, which is a natural antiseptic that may help prevent the adherence of bacteria to the bladder lining. Drinking 100% pure cranberry  juice or taking over the counter cranberry supplements twice daily may help to prevent an infection. However, it is important to note that cranberry juices/supplements are not helpful once a urinary tract infection (UTI) is present. Strengthen Your Core: Often, a lazy bladder (unable to empty urine properly) occurs due to lower back problem, so consider doing exercises to help strengthen your back, pelvic floor, and stomach muscles.  Pay Attention to Your Urine: Your urine can change color for a variety of reasons, including from the medications you take, so pay close attention to it to monitor your overall health. One key thing to note is that if your urine is typically a darker yellow, your body is dehydrated, so you need to step up your water intake.   I have given you two prescriptions for a vaginal estrogen cream.  Estrace and Premarin.  Please take these to your pharmacy and see which one your insurance covers.  If both are too expensive, please call the office at [phone] for an alternative.  You are given a sample of vaginal estrogen cream Premarin and instructed to apply 0.5mg  (pea-sized amount)  just inside the vaginal introitus with a finger-tip on Monday, Wednesday and Friday nights,

## 2017-01-19 ENCOUNTER — Emergency Department
Admission: EM | Admit: 2017-01-19 | Discharge: 2017-01-20 | Disposition: A | Discharge status: Home / Self Care | Payer: Medicare Other | Attending: Emergency Medicine | Admitting: Emergency Medicine

## 2017-01-19 DIAGNOSIS — Z87891 Personal history of nicotine dependence: Secondary | ICD-10-CM | POA: Diagnosis not present

## 2017-01-19 DIAGNOSIS — E860 Dehydration: Secondary | ICD-10-CM | POA: Insufficient documentation

## 2017-01-19 DIAGNOSIS — R55 Syncope and collapse: Secondary | ICD-10-CM | POA: Insufficient documentation

## 2017-01-19 DIAGNOSIS — F0390 Unspecified dementia without behavioral disturbance: Secondary | ICD-10-CM | POA: Diagnosis not present

## 2017-01-19 DIAGNOSIS — Z79899 Other long term (current) drug therapy: Secondary | ICD-10-CM | POA: Insufficient documentation

## 2017-01-19 NOTE — ED Triage Notes (Addendum)
Per [facility] pt from home d/t syncopal episode, pt recently tx for UTI-finished antibiotics bt presenting with strong urine odor. Hx dementia. Pt vss in route, (+) orthostatic in route. cbg 153. Afebrile

## 2017-01-20 ENCOUNTER — Emergency Department: Payer: Medicare Other

## 2017-01-20 ENCOUNTER — Encounter: Payer: Self-pay | Admitting: Emergency Medicine

## 2017-01-20 ENCOUNTER — Observation Stay
Admission: EM | Admit: 2017-01-20 | Discharge: 2017-01-22 | Disposition: A | Discharge status: Home / Self Care | Payer: Medicare Other | Attending: Specialist | Admitting: Specialist

## 2017-01-20 ENCOUNTER — Other Ambulatory Visit: Payer: Self-pay

## 2017-01-20 DIAGNOSIS — Z66 Do not resuscitate: Secondary | ICD-10-CM | POA: Diagnosis not present

## 2017-01-20 DIAGNOSIS — Z79899 Other long term (current) drug therapy: Secondary | ICD-10-CM | POA: Insufficient documentation

## 2017-01-20 DIAGNOSIS — I959 Hypotension, unspecified: Secondary | ICD-10-CM

## 2017-01-20 DIAGNOSIS — R251 Tremor, unspecified: Secondary | ICD-10-CM | POA: Insufficient documentation

## 2017-01-20 DIAGNOSIS — Z87891 Personal history of nicotine dependence: Secondary | ICD-10-CM | POA: Diagnosis not present

## 2017-01-20 DIAGNOSIS — F0391 Unspecified dementia with behavioral disturbance: Secondary | ICD-10-CM | POA: Diagnosis not present

## 2017-01-20 DIAGNOSIS — E860 Dehydration: Secondary | ICD-10-CM | POA: Diagnosis not present

## 2017-01-20 DIAGNOSIS — F03B18 Unspecified dementia, moderate, with other behavioral disturbance: Secondary | ICD-10-CM | POA: Diagnosis present

## 2017-01-20 DIAGNOSIS — D649 Anemia, unspecified: Secondary | ICD-10-CM | POA: Insufficient documentation

## 2017-01-20 DIAGNOSIS — R5381 Other malaise: Principal | ICD-10-CM | POA: Insufficient documentation

## 2017-01-20 DIAGNOSIS — R627 Adult failure to thrive: Secondary | ICD-10-CM | POA: Diagnosis not present

## 2017-01-20 DIAGNOSIS — R32 Unspecified urinary incontinence: Secondary | ICD-10-CM | POA: Insufficient documentation

## 2017-01-20 LAB — URINALYSIS, COMPLETE (UACMP) WITH MICROSCOPIC
Bacteria, UA: NONE SEEN
Bilirubin Urine: NEGATIVE
GLUCOSE, UA: NEGATIVE mg/dL
KETONES UR: NEGATIVE mg/dL
Leukocytes, UA: NEGATIVE
NITRITE: NEGATIVE
PH: 6 (ref 5.0–8.0)
Protein, ur: NEGATIVE mg/dL
Specific Gravity, Urine: 1.015 (ref 1.005–1.030)

## 2017-01-20 LAB — CBC
HCT: 33.9 % — ABNORMAL LOW (ref 35.0–47.0)
Hemoglobin: 11.3 g/dL — ABNORMAL LOW (ref 12.0–16.0)
MCH: 31.7 pg (ref 26.0–34.0)
MCHC: 33.4 g/dL (ref 32.0–36.0)
MCV: 94.9 fL (ref 80.0–100.0)
PLATELETS: 213 10*3/uL (ref 150–440)
RBC: 3.58 MIL/uL — AB (ref 3.80–5.20)
RDW: 13.7 % (ref 11.5–14.5)
WBC: 7.9 10*3/uL (ref 3.6–11.0)

## 2017-01-20 LAB — CBC WITH DIFFERENTIAL/PLATELET
BASOS ABS: 0.1 10*3/uL (ref 0–0.1)
Basophils Relative: 1 %
Eosinophils Absolute: 0.4 10*3/uL (ref 0–0.7)
Eosinophils Relative: 6 %
HCT: 34.8 % — ABNORMAL LOW (ref 35.0–47.0)
HEMOGLOBIN: 11.7 g/dL — AB (ref 12.0–16.0)
LYMPHS ABS: 1.4 10*3/uL (ref 1.0–3.6)
LYMPHS PCT: 18 %
MCH: 31.8 pg (ref 26.0–34.0)
MCHC: 33.7 g/dL (ref 32.0–36.0)
MCV: 94.2 fL (ref 80.0–100.0)
Monocytes Absolute: 0.8 10*3/uL (ref 0.2–0.9)
Monocytes Relative: 10 %
NEUTROS ABS: 4.9 10*3/uL (ref 1.4–6.5)
NEUTROS PCT: 65 %
Platelets: 207 10*3/uL (ref 150–440)
RBC: 3.69 MIL/uL — AB (ref 3.80–5.20)
RDW: 13.9 % (ref 11.5–14.5)
WBC: 7.6 10*3/uL (ref 3.6–11.0)

## 2017-01-20 LAB — BASIC METABOLIC PANEL
ANION GAP: 3 — AB (ref 5–15)
Anion gap: 6 (ref 5–15)
BUN: 17 mg/dL (ref 6–20)
BUN: 20 mg/dL (ref 6–20)
CALCIUM: 9.4 mg/dL (ref 8.9–10.3)
CHLORIDE: 112 mmol/L — AB (ref 101–111)
CO2: 29 mmol/L (ref 22–32)
CO2: 31 mmol/L (ref 22–32)
CREATININE: 1.05 mg/dL — AB (ref 0.44–1.00)
Calcium: 9.1 mg/dL (ref 8.9–10.3)
Chloride: 108 mmol/L (ref 101–111)
Creatinine, Ser: 0.89 mg/dL (ref 0.44–1.00)
GFR calc Af Amer: >60 mL/min (ref >60)
GFR calc non Af Amer: >60 mL/min (ref >60)
GFR, EST NON AFRICAN AMERICAN: 54 mL/min — AB (ref >60)
GLUCOSE: 102 mg/dL — AB (ref 65–99)
Glucose, Bld: 123 mg/dL — ABNORMAL HIGH (ref 65–99)
POTASSIUM: 3.5 mmol/L (ref 3.5–5.1)
Potassium: 3.7 mmol/L (ref 3.5–5.1)
SODIUM: 145 mmol/L (ref 135–145)
Sodium: 144 mmol/L (ref 135–145)

## 2017-01-20 LAB — TROPONIN I: Troponin I: <0.03 ng/mL (ref <0.03)

## 2017-01-20 MED ORDER — ACETAMINOPHEN 325 MG PO TABS: 650.0000 mg | ORAL_TABLET | Freq: Four times a day (QID) | ORAL | Status: DC | PRN

## 2017-01-20 MED ORDER — BISACODYL 10 MG RE SUPP: 10.0000 mg | Freq: Every day | RECTAL | Status: DC | PRN

## 2017-01-20 MED ORDER — DONEPEZIL HCL 5 MG PO TABS
10.0000 mg | ORAL_TABLET | Freq: Every day | ORAL | Status: DC
  Administered 2017-01-20 – 2017-01-21 (×2): 10 mg via ORAL
  Filled 2017-01-20: qty 1
  Filled 2017-01-20: qty 2
  Filled 2017-01-20: qty 1

## 2017-01-20 MED ORDER — ONDANSETRON HCL 4 MG PO TABS: 4.0000 mg | ORAL_TABLET | Freq: Four times a day (QID) | ORAL | Status: DC | PRN

## 2017-01-20 MED ORDER — SODIUM CHLORIDE 0.9 % IV BOLUS (SEPSIS)
1000.0000 mL | Freq: Once | INTRAVENOUS | Status: AC
  Administered 2017-01-20: 1000 mL via INTRAVENOUS

## 2017-01-20 MED ORDER — SODIUM CHLORIDE 0.9 % IV BOLUS (SEPSIS): 500.0000 mL | Freq: Once | INTRAVENOUS | Status: DC

## 2017-01-20 MED ORDER — QUETIAPINE FUMARATE 25 MG PO TABS
25.0000 mg | ORAL_TABLET | Freq: Every day | ORAL | Status: DC
  Administered 2017-01-20 – 2017-01-21 (×2): 25 mg via ORAL
  Filled 2017-01-20 (×2): qty 1

## 2017-01-20 MED ORDER — MEMANTINE HCL 5 MG PO TABS
10.0000 mg | ORAL_TABLET | Freq: Two times a day (BID) | ORAL | Status: DC
  Administered 2017-01-20 – 2017-01-22 (×4): 10 mg via ORAL
  Filled 2017-01-20 (×4): qty 2

## 2017-01-20 MED ORDER — HEPARIN SODIUM (PORCINE) 5000 UNIT/ML IJ SOLN
5000.0000 [IU] | Freq: Three times a day (TID) | INTRAMUSCULAR | Status: DC
  Administered 2017-01-20 – 2017-01-22 (×6): 5000 [IU] via SUBCUTANEOUS
  Filled 2017-01-20 (×6): qty 1

## 2017-01-20 MED ORDER — SODIUM CHLORIDE 0.45 % IV SOLN
INTRAVENOUS | Status: DC
  Administered 2017-01-20 – 2017-01-21 (×2): via INTRAVENOUS

## 2017-01-20 MED ORDER — DIVALPROEX SODIUM 250 MG PO DR TAB
250.0000 mg | DELAYED_RELEASE_TABLET | Freq: Three times a day (TID) | ORAL | Status: DC
  Administered 2017-01-20 – 2017-01-22 (×6): 250 mg via ORAL
  Filled 2017-01-20 (×8): qty 1

## 2017-01-20 MED ORDER — DOCUSATE SODIUM 100 MG PO CAPS
100.0000 mg | ORAL_CAPSULE | Freq: Two times a day (BID) | ORAL | Status: DC
  Administered 2017-01-20 – 2017-01-22 (×4): 100 mg via ORAL
  Filled 2017-01-20 (×4): qty 1

## 2017-01-20 MED ORDER — MIRABEGRON ER 25 MG PO TB24
25.0000 mg | ORAL_TABLET | Freq: Every day | ORAL | Status: DC
  Administered 2017-01-21 – 2017-01-22 (×2): 25 mg via ORAL
  Filled 2017-01-20 (×2): qty 1

## 2017-01-20 MED ORDER — FERROUS SULFATE 325 (65 FE) MG PO TABS
325.0000 mg | ORAL_TABLET | Freq: Three times a day (TID) | ORAL | Status: DC
  Administered 2017-01-21 – 2017-01-22 (×4): 325 mg via ORAL
  Filled 2017-01-20 (×4): qty 1

## 2017-01-20 MED ORDER — ONDANSETRON HCL 4 MG/2ML IJ SOLN: 4.0000 mg | Freq: Four times a day (QID) | INTRAMUSCULAR | Status: DC | PRN

## 2017-01-20 MED ORDER — TRAZODONE HCL 50 MG PO TABS
50.0000 mg | ORAL_TABLET | Freq: Every day | ORAL | Status: DC
  Administered 2017-01-20 – 2017-01-21 (×2): 50 mg via ORAL
  Filled 2017-01-20 (×2): qty 1

## 2017-01-20 MED ORDER — ACETAMINOPHEN 650 MG RE SUPP: 650.0000 mg | Freq: Four times a day (QID) | RECTAL | Status: DC | PRN

## 2017-01-20 NOTE — ED Triage Notes (Signed)
Patient brought in via [facility] for complaints of dehydration.  Patient had witnessed syncopal episode by EMS when standing to get on stretcher.  Patient from home with caretaker.  Poor historian due to history of dementia.  EMS reported patient had visit here in ED due to syncope and UTI.

## 2017-01-20 NOTE — Progress Notes (Signed)
Telephone report Jamie Boyd.

## 2017-01-20 NOTE — ED Provider Notes (Signed)
[facility] Emergency Department Provider Note   ____________________________________________   First MD Initiated Contact with Patient 01/20/17 [phone]     (approximate)  I have reviewed the triage vital signs and the nursing notes.   HISTORY  Chief Complaint Near Syncope and Urinary Tract Infection  EM caveat: Limited somewhat by patient dementia  HPI Jamie Boyd is a 66 y.o. female with a history of dementia, recurrent urinary tract infections recently treated with cephalexin per her son and family at the bedside.  Patient son and family were with her, they report that they were getting her ready for bed this evening.  She was standing to get dressed when she became very lightheaded, she then nearly passed out.  They lowered her to the ground.  Son reports that she did not actually pass out, but was seemingly about 2 and they called EMS.  On EMS arrival he reports that she was starting to get better, she is now acting normally.  She went to church dinner this evening and ate but son and family report they do not think she drank anything more than a couple water.  They think she may be slightly dehydrated.  She is acting normally now and seems to be improved.  She has passed out once about a year ago according to her son, this occurred while she was walking.  No fevers or chills.  Baseline fatigue is frail at baseline not complained of anything.  No headache.  No new weakness.  She is very frail at baseline  Past Medical History:  Diagnosis Date  . Anemia   . Dementia   . Goiter   . Memory deficit     Patient Active Problem List   Diagnosis Date Noted  . Agitation 11/23/2015  . Moderate dementia, with behavioral disturbance 11/23/2015  . Gastroesophageal reflux disease without esophagitis 09/11/2015  . Iron deficiency anemia due to chronic blood loss 06/02/2015  . GERD without esophagitis 06/01/2015  . Difficulty sleeping 04/27/2015  . Moderate  dementia without behavioral disturbance 03/01/2015  . Acute UTI 01/29/2015  . Anemia 01/29/2015  . Memory deficit 01/29/2015    Past Surgical History:  Procedure Laterality Date  . COLONOSCOPY WITH PROPOFOL N/A 02/18/2015   Procedure: COLONOSCOPY WITH PROPOFOL;  Surgeon: Hulen Luster, MD;  Location: [name] ENDOSCOPY;  Service: Gastroenterology;  Laterality: N/A;  . ESOPHAGOGASTRODUODENOSCOPY N/A 02/18/2015   Procedure: ESOPHAGOGASTRODUODENOSCOPY (EGD);  Surgeon: Hulen Luster, MD;  Location: [facility] ENDOSCOPY;  Service: Gastroenterology;  Laterality: N/A;    Prior to Admission medications   Medication Sig Start Date End Date Taking? Authorizing Provider  divalproex (DEPAKOTE) 250 MG DR tablet Take 250 mg by mouth 3 (three) times daily.  11/23/15  Yes [provider]  donepezil (ARICEPT) 10 MG tablet Take 10 mg by mouth at bedtime.    Yes [provider]  estradiol (ESTRACE VAGINAL) 0.1 MG/GM vaginal cream Apply 0.5mg  (pea-sized amount)  just inside the vaginal introitus with a finger-tip every night for two weeks and then Monday, Wednesday and Friday nights. 01/08/17  Yes McGowan, Larene Beach A, PA-C  ferrous sulfate 325 (65 FE) MG EC tablet Take 325 mg by mouth 3 (three) times daily with meals.   Yes [provider]  fexofenadine (ALLEGRA) 180 MG tablet Take 180 mg by mouth daily.  09/13/15 11/27/17 Yes [provider]  memantine (NAMENDA) 10 MG tablet Take 10 mg by mouth 2 (two) times daily.  11/23/15 11/27/17 Yes [provider]  Mirabegron (MYRBETRIQ PO) Take 25 mg by mouth daily.    Yes [provider]  Multiple Vitamins-Minerals (CENTRUM SILVER ULTRA WOMENS PO) Take 1 tablet by mouth daily.    Yes [provider]  QUEtiapine (SEROQUEL) 25 MG tablet Take 125 mg by mouth at bedtime 11/23/15  Yes [provider]  traZODone (DESYREL) 100 MG tablet Take 100 mg nightly at bedtime 04/11/16  Yes [provider]  conjugated estrogens  (PREMARIN) vaginal cream Place 1 Applicatorful vaginally daily. Apply 0.5mg  (pea-sized amount)  just inside the vaginal introitus with a finger-tip every night for two weeks and then Monday, Wednesday and Friday nights. Patient not taking: Reported on 01/20/2017 01/08/17   Zara Council A, PA-C    Allergies Pollen extract  Family History  Problem Relation Age of Onset  . Prostate cancer Neg Hx   . Bladder Cancer Neg Hx   . Kidney cancer Neg Hx     Social History Social History   Tobacco Use  . Smoking status: Former Smoker    Types: Cigarettes    Last attempt to quit: 01/25/2014    Years since quitting: 2.9  . Smokeless tobacco: Never Used  Substance Use Topics  . Alcohol use: No  . Drug use: No    Review of Systems Constitutional: No fever/chills Eyes: No visual changes. ENT: No sore throat. Cardiovascular: Denies chest pain. Respiratory: Denies shortness of breath. Gastrointestinal: No abdominal pain.  No nausea, no vomiting.  Genitourinary: Chronic incontinence and frequent urinary tract infections.  Just finished antibiotic for a urinary tract infection which was prescribed by urology per family Musculoskeletal: Negative for back pain.  No neck pain Skin: Negative for rash. Neurological: Negative for headaches, focal weakness or numbness.    ____________________________________________   PHYSICAL EXAM:  VITAL SIGNS: ED Triage Vitals  Enc Vitals Group     BP 01/20/17 0000 97/69     Pulse Rate 01/20/17 0000 (!) 55     Resp 01/20/17 0000 14     Temp 01/20/17 0000 97.8 F (36.6 C)     Temp Source 01/20/17 0000 Oral     SpO2 01/20/17 0000 100 %     Weight 01/20/17 0001 140 lb 4.8 oz (63.6 kg)     Height 01/20/17 0001 5\' 4"  (1.626 m)     Head Circumference --      Peak Flow --      Pain Score --      Pain Loc --      Pain Edu? --      Excl. in [place]? --     Constitutional: Alert and oriented to her son, seems to remember standing and nearly passing out,  but does not recall date well.. Well appearing and in no acute distress.  She and her son and family are very pleasant. Eyes: Conjunctivae are normal. Head: Atraumatic. Nose: No congestion/rhinnorhea. Mouth/Throat: Mucous membranes are dry. Neck: No stridor.  No cervical tenderness Cardiovascular: Normal rate, regular rhythm. Grossly normal heart sounds.  Good peripheral circulation. Respiratory: Normal respiratory effort.  No retractions. Lungs CTAB. Gastrointestinal: Soft and nontender. No distention. Musculoskeletal: No lower extremity tenderness nor edema. Neurologic:  Normal speech and language. No gross focal neurologic deficits are appreciated.  Skin:  Skin is warm, dry and intact. No rash noted. Psychiatric: Mood and affect are somewhat flat, very slow and intentional with her speech. Speech and behavior are normal.  No psychomotor agitation  ____________________________________________   LABS (all labs ordered are listed,  but only abnormal results are displayed)  Labs Reviewed  BASIC METABOLIC PANEL - Abnormal; Notable for the following components:      Result Value   Glucose, Bld 123 (*)    Creatinine, Ser 1.05 (*)    GFR calc non Af Amer 54 (*)    All other components within normal limits  CBC - Abnormal; Notable for the following components:   RBC 3.58 (*)    Hemoglobin 11.3 (*)    HCT 33.9 (*)    All other components within normal limits  URINALYSIS, COMPLETE (UACMP) WITH MICROSCOPIC - Abnormal; Notable for the following components:   Color, Urine YELLOW (*)    APPearance CLEAR (*)    Hgb urine dipstick SMALL (*)    Squamous Epithelial / LPF 0-5 (*)    All other components within normal limits  URINE CULTURE  TROPONIN I  CBG MONITORING, ED   ____________________________________________  EKG  Reviewed and interpreted by me at midnight The heart rate 69 QRS 110 QTC 4 7 Some baseline artifact, normal sinus rhythm without ischemic changes  noted ____________________________________________  RADIOLOGY  No results found.  No noted indications for CT imaging or CXR imaging.  ____________________________________________   PROCEDURES  Procedure(s) performed: None  Procedures  Critical Care performed: No  ____________________________________________   INITIAL IMPRESSION / ASSESSMENT AND PLAN / ED COURSE  Pertinent labs & imaging results that were available during my care of the patient were reviewed by me and considered in my medical decision making (see chart for details).  Patient presents for evaluation of a near syncopal episode.  Reassuring hemodynamics on arrival, and her baseline on arrival according to family is normal at this time.  Her alertness is normal, she does have dementia, but is alert in no distress.  She complains of no pain or discomfort anywhere.  Family does report decreased oral intake, recent urinary tract infection for which recent antibiotics completed.  Patient denies any acute cardiac or pulmonary symptoms.  She did not have full syncope, rather had a near syncopal episode during the process of standing up suspicious for possible orthostatic etiology.  No evidence of acute neurologic change, denies headache and is at her mental baseline.  No indication for head CT at this time, denies any pulmonary symptoms clear lung sounds, and normal oxygen saturation respiratory pattern.  ----------------------------------------- 3:56 AM on 01/20/2017 -----------------------------------------  Patient resting comfortably, alert, conversant with family.  Discussed with patient and her family including her son, they are all comfortable with the plan to take her home and understand that this was likely related to some type of dehydration for which they will work with her to continue regular hydration and close primary follow-up.  Return precautions and treatment recommendations and follow-up discussed with  the patient who is agreeable with the plan.       ____________________________________________   FINAL CLINICAL IMPRESSION(S) / ED DIAGNOSES  Final diagnoses:  Near syncope  Dehydration, mild      NEW MEDICATIONS STARTED DURING THIS VISIT:  This SmartLink is deprecated. Use AVSMEDLIST instead to display the medication list for a patient.   Note:  This document was prepared using Dragon voice recognition software and may include unintentional dictation errors.     Delman Kitten, MD 01/20/17 [phone]

## 2017-01-20 NOTE — Progress Notes (Signed)
Patient transported via stretcher by Sandi Mealy to room 117.

## 2017-01-20 NOTE — H&P (Signed)
History and Physical    Jamie Boyd OEU:[id number] DOB: 01-05-1951 DOA: 01/20/2017  Referring physician: Dr. Quentin Cornwall PCP: Sharyne Peach, MD  Specialists: Dr. Manuella Ghazi  Chief Complaint: not eating or drinking  HPI: Jamie Boyd is a 66 y.o. female has a past medical history significant for progressive dementia brought to ER by family due to progressive weakness and anorexia. Dementia is worsening. Not eating or drinking at home. Too weak to walk. In ER, BP low and labs suggest dehydration. She is now admitted. Pt is alert but disoriented to place or time. Will answer to her name  Review of Systems: unable to obtain due to confusion and dementia  Past Medical History:  Diagnosis Date  . Anemia   . Dementia   . Goiter   . Memory deficit    Past Surgical History:  Procedure Laterality Date  . COLONOSCOPY WITH PROPOFOL N/A 02/18/2015   Procedure: COLONOSCOPY WITH PROPOFOL;  Surgeon: Hulen Luster, MD;  [facility] ENDOSCOPY;  Service: Gastroenterology;  Laterality: N/A;  . ESOPHAGOGASTRODUODENOSCOPY N/A 02/18/2015   Procedure: ESOPHAGOGASTRODUODENOSCOPY (EGD);  Surgeon: Hulen Luster, MD;  Location: [facility] ENDOSCOPY;  Service: Gastroenterology;  Laterality: N/A;   Social History:  reports that she quit smoking about 2 years ago. Her smoking use included cigarettes. she has never used smokeless tobacco. She reports that she does not drink alcohol or use drugs.  Allergies  Allergen Reactions  . Pollen Extract Other (See Comments)    Family History  Problem Relation Age of Onset  . Prostate cancer Neg Hx   . Bladder Cancer Neg Hx   . Kidney cancer Neg Hx     Prior to Admission medications   Medication Sig Start Date End Date Taking? Authorizing Provider  conjugated estrogens (PREMARIN) vaginal cream Place 1 Applicatorful vaginally daily. Apply 0.5mg  (pea-sized amount)  just inside the vaginal introitus with a finger-tip every night for two weeks and then Monday, Wednesday and Friday  nights. Patient not taking: Reported on 01/20/2017 01/08/17   Zara Council A, PA-C  divalproex (DEPAKOTE) 250 MG DR tablet Take 250 mg by mouth 3 (three) times daily.  11/23/15   [provider]  donepezil (ARICEPT) 10 MG tablet Take 10 mg by mouth at bedtime.     [provider]  estradiol (ESTRACE VAGINAL) 0.1 MG/GM vaginal cream Apply 0.5mg  (pea-sized amount)  just inside the vaginal introitus with a finger-tip every night for two weeks and then Monday, Wednesday and Friday nights. 01/08/17   Zara Council A, PA-C  ferrous sulfate 325 (65 FE) MG EC tablet Take 325 mg by mouth 3 (three) times daily with meals.    [provider]  fexofenadine (ALLEGRA) 180 MG tablet Take 180 mg by mouth daily.  09/13/15 11/27/17  [provider]  memantine (NAMENDA) 10 MG tablet Take 10 mg by mouth 2 (two) times daily.  11/23/15 11/27/17  [provider]  Mirabegron (MYRBETRIQ PO) Take 25 mg by mouth daily.     [provider]  Multiple Vitamins-Minerals (CENTRUM SILVER ULTRA WOMENS PO) Take 1 tablet by mouth daily.     [provider]  QUEtiapine (SEROQUEL) 25 MG tablet Take 125 mg by mouth at bedtime 11/23/15   [provider]  traZODone (DESYREL) 100 MG tablet Take 100 mg nightly at bedtime 04/11/16   [provider]   Physical Exam: Vitals:   01/20/17 1038 01/20/17 1039 01/20/17 1236 01/20/17 1346  BP: (!) 113/102  98/65 100/87  Pulse:  65  62 62  Resp: 20  18 16   Temp: 98.8 F (37.1 C)     SpO2: 94%  100% 100%  Weight:  63.5 kg (140 lb)    Height:  5\' 4"  (1.626 m)       General:  No apparent distress, WDWN, [place]/AT  Eyes: PERRL, EOMI, no scleral icterus, conjunctiva clear  ENT: dry oropharynx without exudate, TM's benign, dentition fair  Neck: supple, no lymphadenopathy. No bruits or thyromegaly  Cardiovascular: regular rate without MRG; 2+ peripheral pulses, no JVD, no peripheral edema  Respiratory: CTA biL,  good air movement without wheezing, rhonchi or crackled. Respiratory effort normal  Abdomen: soft, non tender to palpation, positive bowel sounds, no guarding, no rebound  Skin: no rashes or lesions  Musculoskeletal: normal bulk and tone, no joint swelling  Psychiatric: normal mood and affect, alert, oriented to person only  Neurologic: CN 2-12 grossly intact, Motor strength 5/5 in all 4 groups with symmetric DTR's and non-focal sensory exam  Labs on Admission:  Basic Metabolic Panel: Recent Labs  Lab 01/20/17 0003 01/20/17 1050  NA 145 144  K 3.7 3.5  CL 108 112*  CO2 31 29  GLUCOSE 123* 102*  BUN 20 17  CREATININE 1.05* 0.89  CALCIUM 9.4 9.1   Liver Function Tests: No results for input(s): AST, ALT, ALKPHOS, BILITOT, PROT, ALBUMIN in the last 168 hours. No results for input(s): LIPASE, AMYLASE in the last 168 hours. No results for input(s): AMMONIA in the last 168 hours. CBC: Recent Labs  Lab 01/20/17 0003 01/20/17 1050  WBC 7.9 7.6  NEUTROABS  --  4.9  HGB 11.3* 11.7*  HCT 33.9* 34.8*  MCV 94.9 94.2  PLT 213 207   Cardiac Enzymes: Recent Labs  Lab 01/20/17 0003 01/20/17 1050  TROPONINI <0.03 <0.03    BNP (last 3 results) Recent Labs    11/27/16 1001  BNP 63.0    ProBNP (last 3 results) No results for input(s): PROBNP in the last 8760 hours.  CBG: No results for input(s): GLUCAP in the last 168 hours.  Radiological Exams on Admission: Dg Chest 2 View  Result Date: 01/20/2017 CLINICAL DATA:  Syncopal episode. EXAM: CHEST  2 VIEW COMPARISON:  02/24/2015 FINDINGS: Cardiomediastinal silhouette is normal. Mediastinal contours appear intact. There is no evidence of focal airspace consolidation, pleural effusion or pneumothorax. Osseous structures are without acute abnormality. Soft tissues are grossly normal. IMPRESSION: No active cardiopulmonary disease. Electronically Signed   By: Fidela Salisbury M.D.   On: 01/20/2017 11:51   Ct Head Wo  Contrast  Result Date: 01/20/2017 CLINICAL DATA:  Altered level of consciousness. EXAM: CT HEAD WITHOUT CONTRAST TECHNIQUE: Contiguous axial images were obtained from the base of the skull through the vertex without intravenous contrast. COMPARISON:  None FINDINGS: Brain: No evidence of acute infarction, hemorrhage, hydrocephalus, extra-axial collection or mass lesion/mass effect. Prominence of the sulci and ventricles compatible with brain atrophy peer There is moderate diffuse low-attenuation within the subcortical and periventricular white matter compatible with chronic microvascular disease. Vascular: No hyperdense vessel or unexpected calcification. Skull: Normal. Negative for fracture or focal lesion. Sinuses/Orbits: No acute finding. Other: None. IMPRESSION: 1. No acute intracranial abnormalities. 2. Chronic small vessel ischemic disease and brain atrophy. Electronically Signed   By: Kerby Moors M.D.   On: 01/20/2017 11:27    EKG: Independently reviewed.  Assessment/Plan Principal Problem:   Failure to thrive in adult Active Problems:   Moderate dementia, with behavioral disturbance  Dehydration   Hypotension   Will observe on floor with IV fluids. Consult PT and CSW. Repeat labs in AM. Needs placement.  Diet: regular Fluids: 1/2 NS@100  DVT Prophylaxis: SQ Heparin  Code Status: FULL  Family Communication: yes  Disposition Plan: TBD  Time spent: 50 min

## 2017-01-20 NOTE — Plan of Care (Signed)
Admitted from ED; pt has no one with her to assist with admission. Pt pleasant,cooperative with limited memory recall. Sitting in bed feeding self dinner. Denies co's. IVF's infusing.

## 2017-01-20 NOTE — ED Provider Notes (Addendum)
[facility] Emergency Department Provider Note    First MD Initiated Contact with Patient 01/20/17 1034     (approximate)  I have reviewed the triage vital signs and the nursing notes.   HISTORY  Chief Complaint Near Syncope    HPI Jamie Boyd is a 66 y.o. female with history of advanced dementia Ri presents to the ER after being evaluated last night for near syncopal episode.  Had another episode this morning that was witnessed by family.  Family states that she had generalized shaking spells lasting 20-70minutes.  Did not fully lose consciousness.  Could not get her to stop shaking.  No fall or head injury.  No changes to any medications.  Patient does have advanced dementia but denies any discomfort or pain right now.  Is asking for something to eat.  No previous known history of seizures.  Denies any chest pain or shortness of breath.  Past Medical History:  Diagnosis Date  . Anemia   . Dementia   . Goiter   . Memory deficit    Family History  Problem Relation Age of Onset  . Prostate cancer Neg Hx   . Bladder Cancer Neg Hx   . Kidney cancer Neg Hx    Past Surgical History:  Procedure Laterality Date  . COLONOSCOPY WITH PROPOFOL N/A 02/18/2015   Procedure: COLONOSCOPY WITH PROPOFOL;  Surgeon: Hulen Luster, MD;  Location: [facility] ENDOSCOPY;  Service: Gastroenterology;  Laterality: N/A;  . ESOPHAGOGASTRODUODENOSCOPY N/A 02/18/2015   Procedure: ESOPHAGOGASTRODUODENOSCOPY (EGD);  Surgeon: Hulen Luster, MD;  Location: [facility] ENDOSCOPY;  Service: Gastroenterology;  Laterality: N/A;   Patient Active Problem List   Diagnosis Date Noted  . Dehydration 01/20/2017  . Failure to thrive in adult 01/20/2017  . Hypotension 01/20/2017  . Agitation 11/23/2015  . Moderate dementia, with behavioral disturbance 11/23/2015  . Gastroesophageal reflux disease without esophagitis 09/11/2015  . Iron deficiency anemia due to chronic blood loss 06/02/2015  . GERD without  esophagitis 06/01/2015  . Difficulty sleeping 04/27/2015  . Moderate dementia without behavioral disturbance 03/01/2015  . Acute UTI 01/29/2015  . Anemia 01/29/2015  . Memory deficit 01/29/2015      Prior to Admission medications   Medication Sig Start Date End Date Taking? Authorizing Provider  divalproex (DEPAKOTE) 250 MG DR tablet Take 250 mg by mouth 3 (three) times daily.  11/23/15  Yes [provider]  donepezil (ARICEPT) 10 MG tablet Take 10 mg by mouth at bedtime.    Yes [provider]  estradiol (ESTRACE VAGINAL) 0.1 MG/GM vaginal cream Apply 0.5mg  (pea-sized amount)  just inside the vaginal introitus with a finger-tip every night for two weeks and then Monday, Wednesday and Friday nights. 01/08/17  Yes McGowan, Larene Beach A, PA-C  ferrous sulfate 325 (65 FE) MG EC tablet Take 325 mg by mouth 3 (three) times daily with meals.   Yes [provider]  fexofenadine (ALLEGRA) 180 MG tablet Take 180 mg by mouth daily.  09/13/15 11/27/17 Yes [provider]  memantine (NAMENDA) 10 MG tablet Take 10 mg by mouth 2 (two) times daily.  11/23/15 11/27/17 Yes [provider]  mirabegron ER (MYRBETRIQ) 25 MG TB24 tablet Take 25 mg by mouth daily.    Yes [provider]  Multiple Vitamins-Minerals (CENTRUM SILVER ULTRA WOMENS PO) Take 1 tablet by mouth daily.    Yes [provider]  QUEtiapine (SEROQUEL) 25 MG tablet Take 125 mg by mouth at bedtime 11/23/15  Yes [provider]  traZODone (DESYREL) 100 MG tablet Take 100 mg nightly at bedtime 04/11/16  Yes [provider]  conjugated estrogens (PREMARIN) vaginal cream Place 1 Applicatorful vaginally daily. Apply 0.5mg  (pea-sized amount)  just inside the vaginal introitus with a finger-tip every night for two weeks and then Monday, Wednesday and Friday nights. Patient not taking: Reported on 01/20/2017 01/08/17   Zara Council A, PA-C    Allergies Pollen  extract    Social History Social History   Tobacco Use  . Smoking status: Former Smoker    Types: Cigarettes    Last attempt to quit: 01/25/2014    Years since quitting: 2.9  . Smokeless tobacco: Never Used  Substance Use Topics  . Alcohol use: No  . Drug use: No    Review of Systems Patient denies headaches, rhinorrhea, blurry vision, numbness, shortness of breath, chest pain, edema, cough, abdominal pain, nausea, vomiting, diarrhea, dysuria, fevers, rashes or hallucinations unless otherwise stated above in HPI. ____________________________________________   PHYSICAL EXAM:  VITAL SIGNS: Vitals:   01/20/17 1236 01/20/17 1346  BP: 98/65 100/87  Pulse: 62 62  Resp: 18 16  Temp:    SpO2: 100% 100%    Constitutional: Alert, in no acute distress. Eyes: Conjunctivae are normal.  Head: Atraumatic. Nose: No congestion/rhinnorhea. Mouth/Throat: Mucous membranes are moist.   Neck: No stridor. Painless ROM.  Cardiovascular: Normal rate, regular rhythm. Grossly normal heart sounds.  Good peripheral circulation. Respiratory: Normal respiratory effort.  No retractions. Lungs CTAB. Gastrointestinal: Soft and nontender. No distention. No abdominal bruits. No CVA tenderness. Genitourinary:  Musculoskeletal: No lower extremity tenderness nor edema.  No joint effusions. Neurologic:  CN- intact.  No facial droop. Sensation intact bilaterally.  No gross focal neurologic deficits are appreciated. No gait instability. Skin:  Skin is warm, dry and intact. No rash noted. Psychiatric: withdrawn but no agitation  ____________________________________________   LABS (all labs ordered are listed, but only abnormal results are displayed)  Results for orders placed or performed during the hospital encounter of 01/20/17 (from the past 24 hour(s))  CBC with Differential/Platelet     Status: Abnormal   Collection Time: 01/20/17 10:50 AM  Result Value Ref Range   WBC 7.6 3.6 - 11.0 K/uL   RBC  3.69 (L) 3.80 - 5.20 MIL/uL   Hemoglobin 11.7 (L) 12.0 - 16.0 g/dL   HCT 34.8 (L) 35.0 - 47.0 %   MCV 94.2 80.0 - 100.0 fL   MCH 31.8 26.0 - 34.0 pg   MCHC 33.7 32.0 - 36.0 g/dL   RDW 13.9 11.5 - 14.5 %   Platelets 207 150 - 440 K/uL   Neutrophils Relative % 65 %   Neutro Abs 4.9 1.4 - 6.5 K/uL   Lymphocytes Relative 18 %   Lymphs Abs 1.4 1.0 - 3.6 K/uL   Monocytes Relative 10 %   Monocytes Absolute 0.8 0.2 - 0.9 K/uL   Eosinophils Relative 6 %   Eosinophils Absolute 0.4 0 - 0.7 K/uL   Basophils Relative 1 %   Basophils Absolute 0.1 0 - 0.1 K/uL  Basic metabolic panel     Status: Abnormal   Collection Time: 01/20/17 10:50 AM  Result Value Ref Range   Sodium 144 135 - 145 mmol/L   Potassium 3.5 3.5 - 5.1 mmol/L   Chloride 112 (H) 101 - 111 mmol/L   CO2 29 22 - 32 mmol/L   Glucose, Bld 102 (H) 65 - 99 mg/dL   BUN 17 6 -  20 mg/dL   Creatinine, Ser 0.89 0.44 - 1.00 mg/dL   Calcium 9.1 8.9 - 10.3 mg/dL   GFR calc non Af Amer >60 >60 mL/min   GFR calc Af Amer >60 >60 mL/min   Anion gap 3 (L) 5 - 15  Troponin I     Status: None   Collection Time: 01/20/17 10:50 AM  Result Value Ref Range   Troponin I <0.03 <0.03 ng/mL   ____________________________________________  EKG My review and personal interpretation at Time: 10:35   Indication: shaking spells  Rate: 65  Rhythm: sinus Axis: normal Other: no stemi, normal interals ____________________________________________  RADIOLOGY  I personally reviewed all radiographic images ordered to evaluate for the above acute complaints and reviewed radiology reports and findings.  These findings were personally discussed with the patient.  Please see medical record for radiology report.  ____________________________________________   PROCEDURES  Procedure(s) performed:  Procedures    Critical Care performed: no ____________________________________________   INITIAL IMPRESSION / ASSESSMENT AND PLAN / ED COURSE  Pertinent labs  & imaging results that were available during my care of the patient were reviewed by me and considered in my medical decision making (see chart for details).  DDX: Dehydration, sepsis, pna, uti, dysrhythmia, hypoglycemia, cva, drug effect, withdrawal   Jamie Boyd is a 66 y.o. who presents to the ED with complaints of shaking spells as described above.  Patient has a history of advanced dementia.  She is in no acute distress.  No evidence of sepsis.  Vital signs are stable.  Blood work will be sent for the above differential.  Do suspect some component of mild dehydration as it sounds like a lot of her symptoms are related orthostasis.  CT imaging ordered to evaluate for CVA, subdural or mass occupying lesion shows stable evaluation.  Chest x-ray with no focal abnormality.  Have a low suspicion for seizure given report of generalized shaking and tonic clonic movements for reportedly 20 minutes but the patient has no metabolic acidosis.  May have some form of tremor or intermittent shaking related to her underlying dementia or even a medication side effect but the patient is well-appearing.  ----------------------------------------- 2:19 PM on 01/20/2017 -----------------------------------------  Patient reassessed.  While her vital signs are not orthostatic at this time.  She is still very weak and with shaking episode when standing.  I do not find any evidence of medical emergency.  Patient has been very thirsty and drinking several cups of water.  I do suspect some component of dehydration.  Family is refusing to take patient home.  At this point will speak with the hospitalist to see if there is any opportunity for observation in the hospital.   ____________________________________________   FINAL CLINICAL IMPRESSION(S) / ED DIAGNOSES  Final diagnoses:  Episode of shaking  Physical deconditioning      NEW MEDICATIONS STARTED DURING THIS VISIT:  This SmartLink is deprecated. Use  AVSMEDLIST instead to display the medication list for a patient.   Note:  This document was prepared using Dragon voice recognition software and may include unintentional dictation errors.    Merlyn Lot, MD 01/20/17 1413    Merlyn Lot, MD 01/20/17 [phone]

## 2017-01-20 NOTE — NC FL2 (Signed)
[place] LEVEL OF CARE SCREENING TOOL     IDENTIFICATION  Patient Name: Jamie Boyd Birthdate: Jun 14, 1950 Sex: female Admission Date (Current Location): 01/20/2017  [place] and [place] Number:  Engineering geologist and Address:  [facility], [address], [place], [place] [address]      Provider Number: [id number]  Attending Physician Name and Address:  Idelle Crouch, MD  Relative Name and Phone Number:      Dartha Lodge [phone]  Current Level of Care: Hospital Recommended Level of Care: [place], Memory Care Prior Approval Number:    Date Approved/Denied:   PASRR Number:  awaiting review 01/20/17  Discharge Plan: Domiciliary (Rest home)    Current Diagnoses: Patient Active Problem List   Diagnosis Date Noted  . Dehydration 01/20/2017  . Failure to thrive in adult 01/20/2017  . Hypotension 01/20/2017  . Agitation 11/23/2015  . Moderate dementia, with behavioral disturbance 11/23/2015  . Gastroesophageal reflux disease without esophagitis 09/11/2015  . Iron deficiency anemia due to chronic blood loss 06/02/2015  . GERD without esophagitis 06/01/2015  . Difficulty sleeping 04/27/2015  . Moderate dementia without behavioral disturbance 03/01/2015  . Acute UTI 01/29/2015  . Anemia 01/29/2015  . Memory deficit 01/29/2015    Orientation RESPIRATION BLADDER Height & Weight     Self, Time  Normal Incontinent Weight: 140 lb (63.5 kg) Height:  5\' 4"  (162.6 cm)  BEHAVIORAL SYMPTOMS/MOOD NEUROLOGICAL BOWEL NUTRITION STATUS      Incontinent Diet(normal)  AMBULATORY STATUS COMMUNICATION OF NEEDS Skin   Supervision Verbally Normal                       Personal Care Assistance Level of Assistance  Bathing, Feeding, Dressing, Total care Bathing Assistance: Limited assistance Feeding assistance: Independent Dressing Assistance: Limited assistance Total Care Assistance: Limited assistance    Functional Limitations Info  Sight, Hearing, Speech Sight Info: Adequate Hearing Info: Adequate Speech Info: Adequate(Very quiet)    SPECIAL CARE FACTORS FREQUENCY  PT (By licensed PT), OT (By licensed OT)     PT Frequency: x5 OT Frequency: x5            Contractures Contractures Info: Not present    Additional Factors Info  Allergies   Pollen Extract           Current Medications (01/20/2017):  This is the current hospital active medication list Current Facility-Administered Medications  Medication Dose Route Frequency Provider Last Rate Last Dose  . 0.45 % sodium chloride infusion   Intravenous Continuous Idelle Crouch, MD       Current Outpatient Medications  Medication Sig Dispense Refill  . divalproex (DEPAKOTE) 250 MG DR tablet Take 250 mg by mouth 3 (three) times daily.     Marland Kitchen donepezil (ARICEPT) 10 MG tablet Take 10 mg by mouth at bedtime.     Marland Kitchen estradiol (ESTRACE VAGINAL) 0.1 MG/GM vaginal cream Apply 0.5mg  (pea-sized amount)  just inside the vaginal introitus with a finger-tip every night for two weeks and then Monday, Wednesday and Friday nights. 30 g 12  . ferrous sulfate 325 (65 FE) MG EC tablet Take 325 mg by mouth 3 (three) times daily with meals.    . fexofenadine (ALLEGRA) 180 MG tablet Take 180 mg by mouth daily.     . memantine (NAMENDA) 10 MG tablet Take 10 mg by mouth 2 (two) times daily.     . mirabegron ER (MYRBETRIQ) 25 MG TB24  tablet Take 25 mg by mouth daily.     . Multiple Vitamins-Minerals (CENTRUM SILVER ULTRA WOMENS PO) Take 1 tablet by mouth daily.     . QUEtiapine (SEROQUEL) 25 MG tablet Take 125 mg by mouth at bedtime    . traZODone (DESYREL) 100 MG tablet Take 100 mg nightly at bedtime    . conjugated estrogens (PREMARIN) vaginal cream Place 1 Applicatorful vaginally daily. Apply 0.5mg  (pea-sized amount)  just inside the vaginal introitus with a finger-tip every night for two weeks and then Monday, Wednesday and Friday nights.  (Patient not taking: Reported on 01/20/2017) 30 g 12     Discharge Medications: Please see discharge summary for a list of discharge medications.  Relevant Imaging Results:  Relevant Lab Results:   Additional Information SSN [id number] [place], [place]

## 2017-01-20 NOTE — Discharge Instructions (Signed)
You have been seen today in the Emergency Department (ED)  for near-syncope (almost passing out).  Your workup including labs and EKG show reassuring results.  Your symptoms may be due to dehydration, so it is important that you drink plenty of non-alcoholic fluids. ° °Please call your regular doctor as soon as possible to schedule the next available clinic appointment to follow up with him/her regarding your visit to the ED and your symptoms.  Return to the Emergency Department (ED)  if you have any further syncopal episodes (pass out again) or develop ANY chest pain, pressure, tightness, trouble breathing, sudden sweating, or other symptoms that concern you. ° °

## 2017-01-20 NOTE — ED Notes (Signed)
Patient ate all of food offered and requested more water.

## 2017-01-20 NOTE — Clinical Social Work Note (Signed)
Clinical Social Work Assessment  Patient Details  Name: Jamie Boyd MRN: [id number] Date of Birth: Nov 13, 1950  Date of referral:  01/20/17               Reason for consult:  Facility Placement, [employer]                Permission sought to share information with:  Family Supports, Chartered certified accountant granted to share information::  Yes, Verbal Permission Granted  Name::     Jamie Boyd [phone] Brother  Agency::  All facilities  Relationship::     Contact Information:     Housing/Transportation Living arrangements for the past 2 months:  [place] of Information:  Power of Attorney Patient Interpreter Needed:  None Criminal Activity/Legal Involvement Pertinent to Current Situation/Hospitalization:  No - Comment as needed Significant Relationships:  Church, Siblings, Other Family Members Lives with:  Relatives, Siblings Do you feel safe going back to the place where you live?  Yes Need for family participation in patient care:  Yes (Comment)  Care giving concerns:  They have already spoke to [facility] and are hoping the have a bed still available   Facilities manager / plan: Jamie Boyd is a 66 y.o. female has a past medical history significant for progressive dementia brought to ER by family due to progressive weakness and anorexia. Dementia is worsening. Not eating or drinking at home. Too weak to walk. In ER, BP low and labs suggest dehydration. She is now admitted. Pt is alert but disoriented to place or time . LCSW introduced myself to her brother and patient and asked questions to complete assessment. LCSW will complete assessment and Fl2 and send out info via the hub to have patient hopefully placed. Patient needs full assistance with her ADL's and has a private care giver 3-4 hours 7x week, who assisted her with bathing and dressing she is able to walk using her walker. She has become weak and needs some  PT. She has excellent family support her son has not returned any calls to his mom or uncle and had minimal contact with his mother. The brother found patient 3 years ago in very upsetting living situation and brought his sister back from [place]. Pt is incontinent. It was explained to family they will need a 3 night qualifying stay in order for medicare to have her SNF/ALF with PT. Awaiting PT consult.      Employment status:  Retired Forensic scientist:  Medicaid [place], [employer]) PT Recommendations:  Not assessed at this time Information / Referral to community resources:  [place]  Patient/Family's Response to care: Would like to have her placed in [facility]  Patient/Family's Understanding of and Emotional Response to Diagnosis, Current Treatment, and Prognosis:  Patient has no understanding and is oriented x2  Emotional Assessment Appearance:  Appears stated age Attitude/Demeanor/Rapport:  (Very quiet/withdrawn) Affect (typically observed):  Accepting, Calm Orientation:  Oriented to Self, Oriented to Place, Oriented to  Time Alcohol / Substance use:  Not Applicable Psych involvement (Current and /or in the community):  No (Comment)  Discharge Needs  Concerns to be addressed:  Cognitive Concerns Readmission within the last 30 days:  No Current discharge risk:  Cognitively Impaired Barriers to Discharge:  Continued Medical Work up   Jamie Reamer, LCSW 01/20/2017, 4:28 PM

## 2017-01-20 NOTE — ED Notes (Signed)
Patient transported to Ultrasound 

## 2017-01-20 NOTE — ED Notes (Signed)
Patient ate two packs of graham crackers.

## 2017-01-20 NOTE — Progress Notes (Signed)
Chaplain responded to a consult for prayer. [name] met pt. Pt told [name] that she has a son and daughter. Pt said she has a great family and a great church family. Pt also stated that her pastor knows she is in hospital and will be coming to see her soon. Pt was calm and smiled as she talked about her health and family. Pt said she is having hiccups frequently. [name] listened and validated pt's feelings. Pt asked for prayer to cope with illness, which [name] provided with a ministry of presence.    01/20/17 1900  Clinical Encounter Type  Visited With Patient  Visit Type Follow-up  Referral From Nurse  Consult/Referral To Chaplain  Spiritual Encounters  Spiritual Needs Prayer

## 2017-01-21 DIAGNOSIS — R5381 Other malaise: Secondary | ICD-10-CM | POA: Diagnosis not present

## 2017-01-21 LAB — COMPREHENSIVE METABOLIC PANEL
ALBUMIN: 2.6 g/dL — AB (ref 3.5–5.0)
ALT: 20 U/L (ref 14–54)
AST: 27 U/L (ref 15–41)
Alkaline Phosphatase: 39 U/L (ref 38–126)
Anion gap: 4 — ABNORMAL LOW (ref 5–15)
BILIRUBIN TOTAL: 0.5 mg/dL (ref 0.3–1.2)
BUN: 12 mg/dL (ref 6–20)
CHLORIDE: 107 mmol/L (ref 101–111)
CO2: 28 mmol/L (ref 22–32)
CREATININE: 0.78 mg/dL (ref 0.44–1.00)
Calcium: 8.1 mg/dL — ABNORMAL LOW (ref 8.9–10.3)
GFR calc Af Amer: >60 mL/min (ref >60)
GLUCOSE: 87 mg/dL (ref 65–99)
Potassium: 3.3 mmol/L — ABNORMAL LOW (ref 3.5–5.1)
Sodium: 139 mmol/L (ref 135–145)
Total Protein: 5.4 g/dL — ABNORMAL LOW (ref 6.5–8.1)

## 2017-01-21 LAB — URINE CULTURE
Culture: NO GROWTH
SPECIAL REQUESTS: NORMAL

## 2017-01-21 LAB — CBC
HCT: 32.4 % — ABNORMAL LOW (ref 35.0–47.0)
Hemoglobin: 10.7 g/dL — ABNORMAL LOW (ref 12.0–16.0)
MCH: 31.6 pg (ref 26.0–34.0)
MCHC: 33 g/dL (ref 32.0–36.0)
MCV: 95.7 fL (ref 80.0–100.0)
PLATELETS: 179 10*3/uL (ref 150–440)
RBC: 3.38 MIL/uL — ABNORMAL LOW (ref 3.80–5.20)
RDW: 14.1 % (ref 11.5–14.5)
WBC: 8.5 10*3/uL (ref 3.6–11.0)

## 2017-01-21 LAB — MAGNESIUM: Magnesium: 1.6 mg/dL — ABNORMAL LOW (ref 1.7–2.4)

## 2017-01-21 MED ORDER — SODIUM CHLORIDE 0.9% FLUSH: 3.0000 mL | INTRAVENOUS | Status: DC | PRN

## 2017-01-21 MED ORDER — POTASSIUM CHLORIDE CRYS ER 20 MEQ PO TBCR
20.0000 meq | EXTENDED_RELEASE_TABLET | Freq: Two times a day (BID) | ORAL | Status: DC
  Administered 2017-01-21 – 2017-01-22 (×3): 20 meq via ORAL
  Filled 2017-01-21 (×3): qty 1

## 2017-01-21 MED ORDER — SODIUM CHLORIDE 0.9% FLUSH
3.0000 mL | Freq: Two times a day (BID) | INTRAVENOUS | Status: DC
  Administered 2017-01-21 (×2): 3 mL via INTRAVENOUS

## 2017-01-21 NOTE — Plan of Care (Signed)
  Progressing Spiritual Needs Ability to function at adequate level 01/21/2017 0422 - Progressing by Denice Bors, RN Education: Knowledge of General Education information will improve 01/21/2017 0422 - Progressing by Denice Bors, RN Clinical Measurements: Ability to maintain clinical measurements within normal limits will improve 01/21/2017 0422 - Progressing by Denice Bors, RN Will remain free from infection 01/21/2017 0422 - Progressing by Denice Bors, RN Diagnostic test results will improve 01/21/2017 0422 - Progressing by Denice Bors, RN Activity: Risk for activity intolerance will decrease 01/21/2017 0422 - Progressing by Denice Bors, RN Nutrition: Adequate nutrition will be maintained 01/21/2017 0422 - Progressing by Denice Bors, RN Coping: Level of anxiety will decrease 01/21/2017 0422 - Progressing by Denice Bors, RN Pain Managment: General experience of comfort will improve 01/21/2017 0422 - Progressing by Denice Bors, RN Safety: Ability to remain free from injury will improve 01/21/2017 0422 - Progressing by Denice Bors, RN Skin Integrity: Risk for impaired skin integrity will decrease 01/21/2017 0422 - Progressing by Denice Bors, RN

## 2017-01-21 NOTE — Progress Notes (Signed)
Pt became weak/pale/nauseated when up to [facility] with 2+. Pt returned to bed with comfort measures. Vomited at least 165ml brown liquid with yellow. Zofran IV given with relief . Pt found to be hypotensive sitting up in bed with pt laid down flat. States she feels better lying flat. Color pale, skin warm and dry. Dgt at bedside and updated. Paged Dr. Les Pou with RTC with vs's, HR 120-130' at fib, pt received metoprolol 100mg  this a.m., n/v. Reports she just feels weak; denies chest pain/SOB.  553ml NS bolus started and monitoring response.

## 2017-01-21 NOTE — Evaluation (Signed)
Physical Therapy Evaluation Patient Details Name: Jamie Boyd MRN: [id number] DOB: 01-14-1951 Today's Date: 01/21/2017   History of Present Illness  Pt is a 66 y.o.femalehas a past medical history significant forprogressive dementia brought to ER by family due to progressive weakness and anorexia. Dementia is worsening. Not eating or drinking at home. Too weak to walk. In ER, BP low and labs suggest dehydration. She is now admitted. Pt is alert but disoriented to place or time.  Assessment includes: Generalized weakness and FTT, dementia, hypokalemia, and urinary incontinence.      Clinical Impression  Pt presents with mild deficits in strength, transfers, mobility, gait, balance, and activity tolerance.  Pt required SBA and extra time and effort with bed mobility tasks and was SBA with transfers with good effort and stability.  Pt able to amb 1 x 50' and 1 x 76' with RW and SBA with good stability but required cues for general safety with RW.  Pt's SpO2 and HR were WNL during session but pt did report fatigue after second amb session.  Pt will benefit from HHPT services upon discharge to safely address above deficits for decreased caregiver assistance and eventual return to PLOF.      Follow Up Recommendations Home health PT and 24/hr supervision     Equipment Recommendations  Other (comment)(Unsure secondary to pt's cognition but pt would benefit from a RW if she does not already own one)    Recommendations for Other Services       Precautions / Restrictions Precautions Precautions: Fall Restrictions Weight Bearing Restrictions: No      Mobility  Bed Mobility Overal bed mobility: Needs Assistance Bed Mobility: Sit to Supine;Supine to Sit     Supine to sit: Supervision Sit to supine: Supervision   General bed mobility comments: Extra time and effort required for tasks but no physical assistance needed  Transfers Overall transfer level: Needs assistance Equipment used:  Rolling walker (2 wheeled) Transfers: Sit to/from Stand Sit to Stand: Supervision         General transfer comment: Good eccentric and concentric control during transfers without the use of BUEs to assist.   Ambulation/Gait Ambulation/Gait assistance: Supervision Ambulation Distance (Feet): 75 Feet Assistive device: Rolling walker (2 wheeled) Gait Pattern/deviations: Step-through pattern;Decreased step length - right;Decreased step length - left   Gait velocity interpretation: Below normal speed for age/gender General Gait Details: Slow cadence with gait with good stability with verbal cues for general safety with RW  Stairs            Wheelchair Mobility    Modified Rankin (Stroke Patients Only)       Balance Overall balance assessment: Needs assistance Sitting-balance support: Feet unsupported;Feet supported;No upper extremity supported Sitting balance-Leahy Scale: Good     Standing balance support: Bilateral upper extremity supported Standing balance-Leahy Scale: Good                               Pertinent Vitals/Pain Pain Assessment: No/denies pain    Home Living Family/patient expects to be discharged to:: Unsure(Unable to obtain history secondary to pt's cognition and no family available to assist; minimal history is from chart review) Living Arrangements: Other (Comment)(Unsure per above)               Additional Comments: Per chart review pt requires full assist with ADLs and has a caregiver 7 days/wk for 3-4 hrs/day.      Prior Function  Level of Independence: Needs assistance   Gait / Transfers Assistance Needed: Unsure per above  ADL's / Homemaking Assistance Needed: Per chart review pt requires full assist with ADLs and has a caregiver 7 days/wk for 3-4 hrs/day.          Hand Dominance        Extremity/Trunk Assessment        Lower Extremity Assessment Lower Extremity Assessment: Generalized weakness        Communication      Cognition Arousal/Alertness: Awake/alert Behavior During Therapy: Flat affect Overall Cognitive Status: No family/caregiver present to determine baseline cognitive functioning                                 General Comments: Pt with diagnosis of dementia but no family present to determine baseline cognition      General Comments      Exercises Total Joint Exercises Ankle Circles/Pumps: AROM;Both;10 reps(Pt required verbal and tactile cues for all therex for technique and to stay on task) Quad Sets: Strengthening;Both;10 reps Heel Slides: AROM;Both;5 reps Hip ABduction/ADduction: AAROM;Both;10 reps Straight Leg Raises: AAROM;Both;10 reps Long Arc Quad: AROM;Both;10 reps Knee Flexion: AROM;Both;10 reps Marching in Standing: AROM;Both;10 reps(In sitting)   Assessment/Plan    PT Assessment Patient needs continued PT services  PT Problem List Decreased strength;Decreased activity tolerance;Decreased balance;Decreased knowledge of use of DME;Decreased mobility       PT Treatment Interventions DME instruction;Gait training;Stair training;Functional mobility training;Neuromuscular re-education;Balance training;Therapeutic exercise;Therapeutic activities;Patient/family education    PT Goals (Current goals can be found in the Care Plan section)  Acute Rehab PT Goals PT Goal Formulation: Patient unable to participate in goal setting Time For Goal Achievement: 02/03/17 Potential to Achieve Goals: Good    Frequency Min 2X/week   Barriers to discharge        Co-evaluation               AM-PAC PT "6 Clicks" Daily Activity  Outcome Measure Difficulty turning over in bed (including adjusting bedclothes, sheets and blankets)?: A Little Difficulty moving from lying on back to sitting on the side of the bed? : A Little Difficulty sitting down on and standing up from a chair with arms (e.g., wheelchair, bedside commode, etc,.)?: A Little Help  needed moving to and from a bed to chair (including a wheelchair)?: A Little Help needed walking in hospital room?: A Little Help needed climbing 3-5 steps with a railing? : A Little 6 Click Score: 18    End of Session Equipment Utilized During Treatment: Gait belt Activity Tolerance: Patient tolerated treatment well Patient left: in bed;with call bell/phone within reach;with bed alarm set Nurse Communication: Mobility status PT Visit Diagnosis: Muscle weakness (generalized) (M62.81);Difficulty in walking, not elsewhere classified (R26.2)    Time: 3536-1443 PT Time Calculation (min) (ACUTE ONLY): 27 min   Charges:   PT Evaluation $PT Eval Low Complexity: 1 Low PT Treatments $Therapeutic Exercise: 8-22 mins   PT G Codes:   PT G-Codes **NOT FOR INPATIENT CLASS** Functional Assessment Tool Used: AM-PAC 6 Clicks Basic Mobility Functional Limitation: Mobility: Walking and moving around Mobility: Walking and Moving Around Current Status (X5400): At least 40 percent but less than 60 percent impaired, limited or restricted Mobility: Walking and Moving Around Goal Status [phone]): At least 1 percent but less than 20 percent impaired, limited or restricted    D. Scott Shayde Gervacio PT, DPT 01/21/17, 1:27 PM

## 2017-01-21 NOTE — Care Management (Signed)
Patient placed in observation for progressive weakness.  It is  reported that her brother Jamie Boyd is her HCPOA.  Patient is oriented to place and self but not to current circumstances.  She has dementia.  Have left a voice mail for Mr Jamie Boyd regarding observation status and physical therapy recommendation of home with home health and 24 hour supervision. If discharges home, and she does not have in home caregivers, she most likely would benefit and qualify for medicaid pcs.  Awaiting call back from Mr. Jamie Boyd

## 2017-01-21 NOTE — Care Management Obs Status (Addendum)
MEDICARE OBSERVATION STATUS NOTIFICATION   Patient Details  Name: Jamie Boyd MRN: [id number] Date of Birth: 1950-09-15   Medicare Observation Status Notification Given:  Yes Patient with dementia and not able to understand notice.  Left VM for HCPOA- Mr Gorden Harms and notice is in the room with CM contact information so can review notice and obtain signature  Family member Danielle Dess singed the notice  Katrina Stack, RN 01/21/2017, 10:45 AM

## 2017-01-21 NOTE — Care Management (Addendum)
Informed that patient has 3 HCPOA listed in order of contact- Danielle Dess wife of [name] and  Virl Cagey. All say "y'all should already have the document." CM unable to locate in [place].  Patient is currently open to [facility].  Was recently closed to physical therapy.  Notified agency that patient is under observation status.  Patient does receive Medicaid pcs services 2 hours a day 6 days a week.  Family feel the need to have hours increased. Discussed they could speak with agency that is providing the caregiver  - [facility]. Informed that patient is never left alone

## 2017-01-21 NOTE — Progress Notes (Signed)
[place] at [place] NAME: Jamie Boyd    MR#:  [id number]  DATE OF BIRTH:  Sep 24, 1950  SUBJECTIVE:   Patient here due to generalized weakness/failure to thrive. Patient is alert but confused this morning. Tolerated her breakfast well. No other acute events or complaints presently. No family at bedside.  REVIEW OF SYSTEMS:    Review of Systems  Unable to perform ROS: Dementia    Nutrition: Regular Tolerating Diet: Yes Tolerating PT: Await Eval.    DRUG ALLERGIES:   Allergies  Allergen Reactions  . Pollen Extract Other (See Comments)    VITALS:  Blood pressure (!) 98/50, pulse (!) 53, temperature 97.7 F (36.5 C), temperature source Oral, resp. rate 18, height 5\' 4"  (1.626 m), weight 65.4 kg (144 lb 3 oz), SpO2 100 %.  PHYSICAL EXAMINATION:   Physical Exam  GENERAL:  66 y.o.-year-old patient lying in bed in no acute distress.  EYES: Pupils equal, round, reactive to light and accommodation. No scleral icterus. Extraocular muscles intact.  HEENT: Head atraumatic, normocephalic. Oropharynx and nasopharynx clear.  NECK:  Supple, no jugular venous distention. No thyroid enlargement, no tenderness.  LUNGS: Normal breath sounds bilaterally, no wheezing, rales, rhonchi. No use of accessory muscles of respiration.  CARDIOVASCULAR: S1, S2 normal. No murmurs, rubs, or gallops.  ABDOMEN: Soft, nontender, nondistended. Bowel sounds present. No organomegaly or mass.  EXTREMITIES: No cyanosis, clubbing or edema b/l.    NEUROLOGIC: Cranial nerves II through XII are intact. No focal Motor or sensory deficits b/l. Globally weak.    PSYCHIATRIC: The patient is alert and oriented x 1.  SKIN: No obvious rash, lesion, or ulcer.    LABORATORY PANEL:   CBC Recent Labs  Lab 01/21/17 0500  WBC 8.5  HGB 10.7*  HCT 32.4*  PLT 179    ------------------------------------------------------------------------------------------------------------------  Chemistries  Recent Labs  Lab 01/21/17 0500  NA 139  K 3.3*  CL 107  CO2 28  GLUCOSE 87  BUN 12  CREATININE 0.78  CALCIUM 8.1*  MG 1.6*  AST 27  ALT 20  ALKPHOS 39  BILITOT 0.5   ------------------------------------------------------------------------------------------------------------------  Cardiac Enzymes Recent Labs  Lab 01/20/17 1050  TROPONINI <0.03   ------------------------------------------------------------------------------------------------------------------  RADIOLOGY:  Dg Chest 2 View  Result Date: 01/20/2017 CLINICAL DATA:  Syncopal episode. EXAM: CHEST  2 VIEW COMPARISON:  02/24/2015 FINDINGS: Cardiomediastinal silhouette is normal. Mediastinal contours appear intact. There is no evidence of focal airspace consolidation, pleural effusion or pneumothorax. Osseous structures are without acute abnormality. Soft tissues are grossly normal. IMPRESSION: No active cardiopulmonary disease. Electronically Signed   By: Fidela Salisbury M.D.   On: 01/20/2017 11:51   Ct Head Wo Contrast  Result Date: 01/20/2017 CLINICAL DATA:  Altered level of consciousness. EXAM: CT HEAD WITHOUT CONTRAST TECHNIQUE: Contiguous axial images were obtained from the base of the skull through the vertex without intravenous contrast. COMPARISON:  None FINDINGS: Brain: No evidence of acute infarction, hemorrhage, hydrocephalus, extra-axial collection or mass lesion/mass effect. Prominence of the sulci and ventricles compatible with brain atrophy peer There is moderate diffuse low-attenuation within the subcortical and periventricular white matter compatible with chronic microvascular disease. Vascular: No hyperdense vessel or unexpected calcification. Skull: Normal. Negative for fracture or focal lesion. Sinuses/Orbits: No acute finding. Other: None. IMPRESSION: 1. No acute  intracranial abnormalities. 2. Chronic small vessel ischemic disease and brain atrophy. Electronically Signed   By: Kerby Moors M.D.   On: 01/20/2017 11:27  ASSESSMENT AND PLAN:   66 year old female with past medical history of dementia, chronic anemia who presented to the hospital yesterday due to generalized weakness and poor by mouth intake.  1. Generalized weakness/failure to thrive-this is secondary to her dementia. As per the nurse patient's by mouth intake improved since yesterday. No evidence of acute infectious or metabolic source. -CT head was negative for acute pathology. -Await physical therapy evaluation to assess for mobility.  2. Hypokalemia-we'll place on oral potassium supplements. Repeat level in the morning.  3. History of dementia-continue Namenda, Depakote, Aricept.  4. Urinary Incontinence - cont. Myrbetriq.    All the records are reviewed and case discussed with Care Management/Social Worker. Management plans discussed with the patient, family and they are in agreement.  CODE STATUS: DNR  DVT Prophylaxis: Hep. SQ  TOTAL TIME TAKING CARE OF THIS PATIENT: 30 minutes.   POSSIBLE D/C IN 1-2 DAYS, DEPENDING ON CLINICAL CONDITION.   Henreitta Leber M.D on 01/21/2017 at 11:59 AM  Between 7am to 6pm - Pager - [phone]  After 6pm go to [email or web address] - Proofreader  [facility]  Office  [phone]  CC: Primary care physician; Sharyne Peach, MD

## 2017-01-21 NOTE — Plan of Care (Signed)
Pt has eaten well; drinking well.  VSS, no deficits other than memory recall. Very pleasant/cooperative. PT consult done. Brother and 2 other family members came in briefly; eager to leave. Called case manager, Bonnita Nasuti to meet with them regarding her status. No tremors or weak episodes noted.

## 2017-01-22 DIAGNOSIS — R5381 Other malaise: Secondary | ICD-10-CM | POA: Diagnosis not present

## 2017-01-22 NOTE — Discharge Summary (Signed)
[place] at [place] [facility] [place] NAME: Jamie Boyd    MR#:  [id number]  DATE OF BIRTH:  Jul 16, 1950  DATE OF ADMISSION:  01/20/2017 ADMITTING PHYSICIAN: Idelle Crouch, MD  DATE OF DISCHARGE: 01/22/2017  PRIMARY CARE PHYSICIAN: Sharyne Peach, MD    ADMISSION DIAGNOSIS:  Physical deconditioning [R53.81] Episode of shaking [R25.1]  DISCHARGE DIAGNOSIS:  Principal Problem:   Failure to thrive in adult Active Problems:   Moderate dementia, with behavioral disturbance   Dehydration   Hypotension   SECONDARY DIAGNOSIS:   Past Medical History:  Diagnosis Date  . Anemia   . Dementia   . Goiter   . Memory deficit     HOSPITAL COURSE:   66 year old female with past medical history of dementia, chronic anemia who presented to the hospital due to generalized weakness and poor by mouth intake.  1. Generalized weakness/failure to thrive-this was secondary to her dementia.  -Patient was admitted to the hospital underwent a CT scan of her head which was negative for acute pathology. Her by mouth intake is improved since admission and so has her weakness. She was seen by physical therapy and recommended home health services and patient is being discharged home with home health nursing, physical therapy.  2. Hypokalemia-this was secondary to poor by mouth intake and improved with supplementation and now is normalized.  3. History of dementia- she will continue Namenda, Depakote, Aricept.  4. Urinary Incontinence - she will cont. Myrbetriq.   Discharge home today with home health physical therapy, nursing, home health aide and social work.  DISCHARGE CONDITIONS:   Stable.   CONSULTS OBTAINED:    DRUG ALLERGIES:   Allergies  Allergen Reactions  . Pollen Extract Other (See Comments)    DISCHARGE MEDICATIONS:   Allergies as of 01/22/2017      Reactions   Pollen Extract Other (See Comments)      Medication List    STOP taking  these medications   conjugated estrogens vaginal cream Commonly known as:  PREMARIN     TAKE these medications   CENTRUM SILVER ULTRA WOMENS PO Take 1 tablet by mouth daily.   divalproex 250 MG DR tablet Commonly known as:  DEPAKOTE Take 250 mg by mouth 3 (three) times daily.   donepezil 10 MG tablet Commonly known as:  ARICEPT Take 10 mg by mouth at bedtime.   estradiol 0.1 MG/GM vaginal cream Commonly known as:  ESTRACE VAGINAL Apply 0.5mg  (pea-sized amount)  just inside the vaginal introitus with a finger-tip every night for two weeks and then Monday, Wednesday and Friday nights.   ferrous sulfate 325 (65 FE) MG EC tablet Take 325 mg by mouth 3 (three) times daily with meals.   fexofenadine 180 MG tablet Commonly known as:  ALLEGRA Take 180 mg by mouth daily.   memantine 10 MG tablet Commonly known as:  NAMENDA Take 10 mg by mouth 2 (two) times daily.   MYRBETRIQ 25 MG Tb24 tablet Generic drug:  mirabegron ER Take 25 mg by mouth daily.   QUEtiapine 25 MG tablet Commonly known as:  SEROQUEL Take 125 mg by mouth at bedtime   traZODone 100 MG tablet Commonly known as:  DESYREL Take 100 mg nightly at bedtime         DISCHARGE INSTRUCTIONS:   DIET:  Regular diet  DISCHARGE CONDITION:  Stable  ACTIVITY:  Activity as tolerated  OXYGEN:  Home Oxygen: No.   Oxygen Delivery: room air  DISCHARGE LOCATION:  Home with home health nursing, physical therapy, home health aide and social work.   If you experience worsening of your admission symptoms, develop shortness of breath, life threatening emergency, suicidal or homicidal thoughts you must seek medical attention immediately by calling 911 or calling your MD immediately  if symptoms less severe.  You Must read complete instructions/literature along with all the possible adverse reactions/side effects for all the Medicines you take and that have been prescribed to you. Take any new Medicines after you have  completely understood and accpet all the possible adverse reactions/side effects.   Please note  You were cared for by a hospitalist during your hospital stay. If you have any questions about your discharge medications or the care you received while you were in the hospital after you are discharged, you can call the unit and asked to speak with the hospitalist on call if the hospitalist that took care of you is not available. Once you are discharged, your primary care physician will handle any further medical issues. Please note that NO REFILLS for any discharge medications will be authorized once you are discharged, as it is imperative that you return to your primary care physician (or establish a relationship with a primary care physician if you do not have one) for your aftercare needs so that they can reassess your need for medications and monitor your lab values.     Today   By mouth intake is stable, no other acute events overnight. Seen by physical therapy and recommended home health services.  VITAL SIGNS:  Blood pressure (!) 105/53, pulse 64, temperature 98.4 F (36.9 C), temperature source Oral, resp. rate 17, height 5\' 4"  (1.626 m), weight 65.6 kg (144 lb 9.6 oz), SpO2 97 %.  I/O:    Intake/Output Summary (Last 24 hours) at 01/22/2017 1459 Last data filed at 01/22/2017 1418 Gross per 24 hour  Intake 720 ml  Output -  Net 720 ml    PHYSICAL EXAMINATION:   GENERAL:  66 y.o.-year-old patient lying in bed in no acute distress.  EYES: Pupils equal, round, reactive to light and accommodation. No scleral icterus. Extraocular muscles intact.  HEENT: Head atraumatic, normocephalic. Oropharynx and nasopharynx clear.  NECK:  Supple, no jugular venous distention. No thyroid enlargement, no tenderness.  LUNGS: Normal breath sounds bilaterally, no wheezing, rales, rhonchi. No use of accessory muscles of respiration.  CARDIOVASCULAR: S1, S2 normal. No murmurs, rubs, or gallops.   ABDOMEN: Soft, nontender, nondistended. Bowel sounds present. No organomegaly or mass.  EXTREMITIES: No cyanosis, clubbing or edema b/l.    NEUROLOGIC: Cranial nerves II through XII are intact. No focal Motor or sensory deficits b/l. Globally weak.    PSYCHIATRIC: The patient is alert and oriented x 1.  SKIN: No obvious rash, lesion, or ulcer.     DATA REVIEW:   CBC Recent Labs  Lab 01/21/17 0500  WBC 8.5  HGB 10.7*  HCT 32.4*  PLT 179    Chemistries  Recent Labs  Lab 01/21/17 0500  NA 139  K 3.3*  CL 107  CO2 28  GLUCOSE 87  BUN 12  CREATININE 0.78  CALCIUM 8.1*  MG 1.6*  AST 27  ALT 20  ALKPHOS 39  BILITOT 0.5    Cardiac Enzymes Recent Labs  Lab 01/20/17 1050  TROPONINI <0.03    Microbiology Results  Results for orders placed or performed during the hospital encounter of 01/19/17  Urine Culture     Status: None  Collection Time: 01/20/17 12:03 AM  Result Value Ref Range Status   Specimen Description URINE, CATHETERIZED  Final   Special Requests Normal  Final   Culture   Final    NO GROWTH Performed at [facility], [address] [place], [place] [address]    Report Status 01/21/2017 FINAL  Final    RADIOLOGY:  No results found.    Management plans discussed with the patient, family and they are in agreement.  CODE STATUS:     Code Status Orders  (From admission, onward)        Start     Ordered   01/21/17 0053  Do not attempt resuscitation (DNR)  Continuous    Question Answer Comment  In the event of cardiac or respiratory ARREST Do not call a "code blue"   In the event of cardiac or respiratory ARREST Do not perform Intubation, CPR, defibrillation or ACLS   In the event of cardiac or respiratory ARREST Use medication by any route, position, wound care, and other measures to relive pain and suffering. May use oxygen, suction and manual treatment of airway obstruction as needed for comfort.      01/21/17 0052   TOTAL TIME  TAKING CARE OF THIS PATIENT: 40 minutes.    Henreitta Leber M.D on 01/22/2017 at 2:59 PM  Between 7am to 6pm - Pager - [phone]  After 6pm go to [email or web address] - Proofreader  [facility]  Office  [phone]  CC: Primary care physician; Sharyne Peach, MD

## 2017-01-22 NOTE — Progress Notes (Signed)
Clinical Education officer, museum (CSW) contacted patient's brother [name] and made him aware that PT is recommending home health and MD will likely D/C patient today. Per brother he will get someone to pick patient up and bring her home to his house in [place] where she lives. Per brother he is working on Architectural technologist services. RN case manager aware of above. Please reconsult if future social work needs arise. CSW signing off.   [employer], LCSW [phone]

## 2017-01-22 NOTE — Progress Notes (Signed)
pts niece  Here to pick pt up. Discharge instructions discussed with pts niece.  meds discussed. Diet activity and f/u discussed.  Home via w/c at this time no c/o. Sl d/cd earlier.

## 2017-01-22 NOTE — Plan of Care (Signed)
  Progressing Spiritual Needs Ability to function at adequate level 01/22/2017 1256 - Progressing by Rowe Robert, RN Education: Knowledge of General Education information will improve 01/22/2017 1256 - Progressing by Rowe Robert, RN Clinical Measurements: Ability to maintain clinical measurements within normal limits will improve 01/22/2017 1256 - Progressing by Rowe Robert, RN Will remain free from infection 01/22/2017 1256 - Progressing by Rowe Robert, RN Diagnostic test results will improve 01/22/2017 1256 - Progressing by Rowe Robert, RN Activity: Risk for activity intolerance will decrease 01/22/2017 1256 - Progressing by Rowe Robert, RN Nutrition: Adequate nutrition will be maintained 01/22/2017 1256 - Progressing by Rowe Robert, RN Coping: Level of anxiety will decrease 01/22/2017 1256 - Progressing by Rowe Robert, RN Elimination: Will not experience complications related to bowel motility 01/22/2017 1256 - Progressing by Rowe Robert, RN Pain Managment: General experience of comfort will improve 01/22/2017 1256 - Progressing by Rowe Robert, RN Safety: Ability to remain free from injury will improve 01/22/2017 1256 - Progressing by Rowe Robert, RN Skin Integrity: Risk for impaired skin integrity will decrease 01/22/2017 1256 - Progressing by Rowe Robert, RN

## 2017-01-22 NOTE — Care Management (Signed)
Discharge to home today per Dr. Tor Netters. Telephone call to Jana Half, [facility] representative updated.  Family will transport Jamie Ammons RN MSN CCM Care Management [phone]

## 2017-01-22 NOTE — Progress Notes (Signed)
Chaplain made a follow up with pt. Pt has just finish eating her lunch and was resting in the bed. Pt stated she was doing better today. Pt suffers form dementia and kept on repeating the same things to chaplain. Pt appeared calm and in good spirit. Pt to be discharged this PM. CH provided support and ministry of presence.    01/22/17 1300  Clinical Encounter Type  Visited With Patient  Visit Type Follow-up;Spiritual support  Referral From Chaplain  Consult/Referral To Chaplain  Spiritual Encounters  Spiritual Needs Other (Comment)

## 2017-01-22 NOTE — Discharge Instructions (Signed)
Deconditioning Deconditioning refers to the changes in your body that occur during a period of inactivity. The changes happen in your heart, lungs, and muscles. They decrease your ability to be active, and they make you feel tired and weak. There are three stages of deconditioning:  Mild deconditioning. At this stage, you will notice a change in your ability to do your usual exercise activities, such as running, biking, or swimming.  Moderate deconditioning. At this stage, you will notice a change in your ability to do normal everyday activities, such as walking, grocery shopping, and doing chores.  Severe deconditioning. At this stage, you will notice a change in your ability to do minimal activity or normal self-care.  Deconditioning can occur after only a few days of inactivity. The longer the period of inactivity, the more severe the deconditioning will be, and the longer it will take to return to your previous level of functioning. What are the causes? Deconditioning is often caused by inactivity due to:  Illnesses, such as cancer, stroke, heart attack, fibromyalgia, and chronic fatigue syndrome.  Injuries, especially back injuries, broken bones, and ligament and tendon injuries.  A long stay in the hospital.  Pregnancy, especially if long periods of bed rest are needed.  What increases the risk? This condition is more likely to develop in:  People who are hospitalized.  People on bed rest.  People who are obese.  People with poor nutrition.  Elderly adults.  People with injuries or illnesses that interfere with movement and activity.  What are the signs or symptoms? Symptoms of deconditioning include:  Weakness.  Tiredness.  Shortness of breath with minor exertion.  A faster-than-normal heartbeat. You may not notice this without taking your pulse.  Pain or discomfort with activity.  Decreased strength.  Decreased sense of balance.  Decreased  endurance.  Difficulty doing your usual forms of exercise.  Difficulty doing activities of daily living, such as grocery shopping or chores.  Difficulty walking around the house and doing basic self-care, such as getting to the bathroom, preparing meals, or doing laundry.  How is this diagnosed? Deconditioning is diagnosed based on your medical history and a physical exam. During the physical exam, your health care provider will check for signs of deconditioning, such as:  Decreased size of muscles.  Decreased strength.  Trouble with balance.  Shortness of breath or abnormally increased heart rate after minor exertion.  How is this treated? Treatment for deconditioning usually involves following a structured exercise program in which activity is increased gradually. Your health care provider will determine which exercises are right for you. The exercise program will likely include aerobic exercise and strength training:  Aerobic exercise helps improve the functioning of the heart and lungs as well as the muscles.  Strength training helps improve muscle size and strength.  Both of these types of exercise will improve your endurance. You may be referred to a physical therapist who can create a safe strengthening program for you to follow. Follow these instructions at home:  Follow the exercise program that is recommended by your health care provider or physical therapist.  Do not increase your exercise any faster than directed.  Eat a healthy diet.  Do not use any products that contain nicotine or tobacco, such as cigarettes and e-cigarettes. If you need help quitting, ask your health care provider.  Take over-the-counter and prescription medicines only as told by your health care provider.  Keep all follow-up visits as told by your health  care provider. This is important. Contact a health care provider if:  You are not able to carry out the prescribed exercise program.  You  are becoming more and more fatigued and weak.  You become light-headed when rising to a sitting or standing position.  Your level of endurance decreases after it has improved. Get help right away if:  You have chest pain.  You are very short of breath.  You have any episodes of passing out. This information is not intended to replace advice given to you by your health care provider. Make sure you discuss any questions you have with your health care provider. Document Released: 06/09/2013 Document Revised: 08/13/2015 Document Reviewed: 04/24/2015 Elsevier Interactive Patient Education  [employer]. Be sure to stay well hydrated.  Follow up with PCP and neurologist for shaking spells.  Return for any additional concerns or questions.

## 2017-01-24 ENCOUNTER — Inpatient Hospital Stay
Admission: EM | Admit: 2017-01-24 | Discharge: 2017-01-27 | DRG: 948 | Disposition: A | Discharge status: Home Health | Payer: 59 | Attending: Specialist | Admitting: Specialist

## 2017-01-24 ENCOUNTER — Other Ambulatory Visit: Payer: Self-pay

## 2017-01-24 ENCOUNTER — Emergency Department: Payer: 59

## 2017-01-24 ENCOUNTER — Inpatient Hospital Stay: Payer: 59

## 2017-01-24 DIAGNOSIS — M1990 Unspecified osteoarthritis, unspecified site: Secondary | ICD-10-CM | POA: Diagnosis present

## 2017-01-24 DIAGNOSIS — G8191 Hemiplegia, unspecified affecting right dominant side: Secondary | ICD-10-CM | POA: Diagnosis present

## 2017-01-24 DIAGNOSIS — R627 Adult failure to thrive: Secondary | ICD-10-CM | POA: Diagnosis present

## 2017-01-24 DIAGNOSIS — I6522 Occlusion and stenosis of left carotid artery: Secondary | ICD-10-CM | POA: Diagnosis present

## 2017-01-24 DIAGNOSIS — Z87891 Personal history of nicotine dependence: Secondary | ICD-10-CM

## 2017-01-24 DIAGNOSIS — Z91048 Other nonmedicinal substance allergy status: Secondary | ICD-10-CM | POA: Diagnosis not present

## 2017-01-24 DIAGNOSIS — F0390 Unspecified dementia without behavioral disturbance: Secondary | ICD-10-CM | POA: Diagnosis present

## 2017-01-24 DIAGNOSIS — F329 Major depressive disorder, single episode, unspecified: Secondary | ICD-10-CM | POA: Diagnosis present

## 2017-01-24 DIAGNOSIS — Z66 Do not resuscitate: Secondary | ICD-10-CM | POA: Diagnosis present

## 2017-01-24 DIAGNOSIS — Z79899 Other long term (current) drug therapy: Secondary | ICD-10-CM

## 2017-01-24 DIAGNOSIS — R531 Weakness: Secondary | ICD-10-CM | POA: Diagnosis present

## 2017-01-24 DIAGNOSIS — Z23 Encounter for immunization: Secondary | ICD-10-CM | POA: Diagnosis present

## 2017-01-24 DIAGNOSIS — I639 Cerebral infarction, unspecified: Secondary | ICD-10-CM | POA: Diagnosis not present

## 2017-01-24 DIAGNOSIS — D649 Anemia, unspecified: Secondary | ICD-10-CM | POA: Diagnosis present

## 2017-01-24 DIAGNOSIS — R52 Pain, unspecified: Secondary | ICD-10-CM

## 2017-01-24 DIAGNOSIS — R29703 NIHSS score 3: Secondary | ICD-10-CM | POA: Diagnosis present

## 2017-01-24 DIAGNOSIS — I959 Hypotension, unspecified: Secondary | ICD-10-CM | POA: Diagnosis present

## 2017-01-24 DIAGNOSIS — R4182 Altered mental status, unspecified: Principal | ICD-10-CM | POA: Diagnosis present

## 2017-01-24 DIAGNOSIS — I9589 Other hypotension: Secondary | ICD-10-CM | POA: Diagnosis not present

## 2017-01-24 LAB — BASIC METABOLIC PANEL
ANION GAP: 6 (ref 5–15)
BUN: 14 mg/dL (ref 6–20)
CALCIUM: 9.2 mg/dL (ref 8.9–10.3)
CO2: 28 mmol/L (ref 22–32)
CREATININE: 0.74 mg/dL (ref 0.44–1.00)
Chloride: 100 mmol/L — ABNORMAL LOW (ref 101–111)
GFR calc non Af Amer: >60 mL/min (ref >60)
Glucose, Bld: 95 mg/dL (ref 65–99)
Potassium: 4 mmol/L (ref 3.5–5.1)
SODIUM: 134 mmol/L — AB (ref 135–145)

## 2017-01-24 LAB — LIPID PANEL
CHOL/HDL RATIO: 2.6 ratio
CHOLESTEROL: 217 mg/dL — AB (ref 0–200)
HDL: 84 mg/dL (ref >40)
LDL Cholesterol: 121 mg/dL — ABNORMAL HIGH (ref 0–99)
Triglycerides: 59 mg/dL (ref <150)
VLDL: 12 mg/dL (ref 0–40)

## 2017-01-24 LAB — CBC WITH DIFFERENTIAL/PLATELET
BASOS ABS: 0 10*3/uL (ref 0–0.1)
BASOS PCT: 0 %
EOS ABS: 0.1 10*3/uL (ref 0–0.7)
Eosinophils Relative: 2 %
HEMATOCRIT: 33.5 % — AB (ref 35.0–47.0)
HEMOGLOBIN: 11.2 g/dL — AB (ref 12.0–16.0)
Lymphocytes Relative: 15 %
Lymphs Abs: 1.4 10*3/uL (ref 1.0–3.6)
MCH: 31.6 pg (ref 26.0–34.0)
MCHC: 33.5 g/dL (ref 32.0–36.0)
MCV: 94.2 fL (ref 80.0–100.0)
MONOS PCT: 17 %
Monocytes Absolute: 1.7 10*3/uL — ABNORMAL HIGH (ref 0.2–0.9)
NEUTROS ABS: 6.5 10*3/uL (ref 1.4–6.5)
NEUTROS PCT: 66 %
Platelets: 195 10*3/uL (ref 150–440)
RBC: 3.56 MIL/uL — AB (ref 3.80–5.20)
RDW: 14 % (ref 11.5–14.5)
WBC: 9.8 10*3/uL (ref 3.6–11.0)

## 2017-01-24 LAB — URINALYSIS, COMPLETE (UACMP) WITH MICROSCOPIC
BACTERIA UA: NONE SEEN
BILIRUBIN URINE: NEGATIVE
Glucose, UA: NEGATIVE mg/dL
HGB URINE DIPSTICK: NEGATIVE
KETONES UR: 5 mg/dL — AB
NITRITE: NEGATIVE
PROTEIN: NEGATIVE mg/dL
RBC / HPF: NONE SEEN RBC/hpf (ref 0–5)
SPECIFIC GRAVITY, URINE: 1.005 (ref 1.005–1.030)
Squamous Epithelial / LPF: NONE SEEN
pH: 7 (ref 5.0–8.0)

## 2017-01-24 LAB — TSH: TSH: 2.035 u[IU]/mL (ref 0.350–4.500)

## 2017-01-24 MED ORDER — ADULT MULTIVITAMIN W/MINERALS CH
1.0000 | ORAL_TABLET | Freq: Every day | ORAL | Status: DC
  Administered 2017-01-25 – 2017-01-27 (×3): 1 via ORAL
  Filled 2017-01-24 (×3): qty 1

## 2017-01-24 MED ORDER — ATORVASTATIN CALCIUM 20 MG PO TABS
40.0000 mg | ORAL_TABLET | Freq: Every day | ORAL | Status: DC
  Administered 2017-01-25 – 2017-01-26 (×2): 40 mg via ORAL
  Filled 2017-01-24 (×2): qty 2

## 2017-01-24 MED ORDER — FERROUS SULFATE 325 (65 FE) MG PO TABS
325.0000 mg | ORAL_TABLET | Freq: Three times a day (TID) | ORAL | Status: DC
  Administered 2017-01-25 – 2017-01-27 (×8): 325 mg via ORAL
  Filled 2017-01-24 (×7): qty 1

## 2017-01-24 MED ORDER — ACETAMINOPHEN 650 MG RE SUPP
650.0000 mg | Freq: Four times a day (QID) | RECTAL | Status: DC | PRN
  Administered 2017-01-25: 07:00:00 650 mg via RECTAL
  Filled 2017-01-24: qty 1

## 2017-01-24 MED ORDER — TRAZODONE HCL 50 MG PO TABS
100.0000 mg | ORAL_TABLET | Freq: Every day | ORAL | Status: DC
  Administered 2017-01-25 – 2017-01-27 (×2): 100 mg via ORAL
  Filled 2017-01-24 (×2): qty 2

## 2017-01-24 MED ORDER — ENOXAPARIN SODIUM 40 MG/0.4ML ~~LOC~~ SOLN
40.0000 mg | SUBCUTANEOUS | Status: DC
  Administered 2017-01-25 – 2017-01-27 (×2): 40 mg via SUBCUTANEOUS
  Filled 2017-01-24 (×2): qty 0.4

## 2017-01-24 MED ORDER — SODIUM CHLORIDE 0.9 % IV BOLUS (SEPSIS)
1000.0000 mL | Freq: Once | INTRAVENOUS | Status: AC
  Administered 2017-01-24: 1000 mL via INTRAVENOUS

## 2017-01-24 MED ORDER — MEMANTINE HCL 5 MG PO TABS
10.0000 mg | ORAL_TABLET | Freq: Two times a day (BID) | ORAL | Status: DC
  Administered 2017-01-25 – 2017-01-27 (×5): 10 mg via ORAL
  Filled 2017-01-24 (×5): qty 2

## 2017-01-24 MED ORDER — DONEPEZIL HCL 5 MG PO TABS
10.0000 mg | ORAL_TABLET | Freq: Every day | ORAL | Status: DC
  Administered 2017-01-25: 10 mg via ORAL
  Filled 2017-01-24 (×4): qty 2

## 2017-01-24 MED ORDER — ASPIRIN 300 MG RE SUPP
300.0000 mg | Freq: Every day | RECTAL | Status: DC
  Filled 2017-01-24: qty 1

## 2017-01-24 MED ORDER — QUETIAPINE FUMARATE 25 MG PO TABS
125.0000 mg | ORAL_TABLET | Freq: Every day | ORAL | Status: DC
  Administered 2017-01-25 – 2017-01-27 (×2): 125 mg via ORAL
  Filled 2017-01-24 (×2): qty 5

## 2017-01-24 MED ORDER — LORATADINE 10 MG PO TABS
10.0000 mg | ORAL_TABLET | Freq: Every day | ORAL | Status: DC
  Administered 2017-01-25 – 2017-01-27 (×3): 10 mg via ORAL
  Filled 2017-01-24 (×3): qty 1

## 2017-01-24 MED ORDER — PNEUMOCOCCAL VAC POLYVALENT 25 MCG/0.5ML IJ INJ
0.5000 mL | INJECTION | INTRAMUSCULAR | Status: AC
  Administered 2017-01-25: 0.5 mL via INTRAMUSCULAR
  Filled 2017-01-24: qty 0.5

## 2017-01-24 MED ORDER — INFLUENZA VAC SPLIT HIGH-DOSE 0.5 ML IM SUSY
0.5000 mL | PREFILLED_SYRINGE | INTRAMUSCULAR | Status: DC
  Filled 2017-01-24: qty 0.5

## 2017-01-24 MED ORDER — ASPIRIN 81 MG PO CHEW
324.0000 mg | CHEWABLE_TABLET | Freq: Once | ORAL | Status: DC
Start: 2017-01-24 — End: 2017-01-24

## 2017-01-24 MED ORDER — IOPAMIDOL (ISOVUE-370) INJECTION 76%
100.0000 mL | Freq: Once | INTRAVENOUS | Status: AC | PRN
  Administered 2017-01-24: 100 mL via INTRAVENOUS

## 2017-01-24 MED ORDER — DIVALPROEX SODIUM 250 MG PO DR TAB
250.0000 mg | DELAYED_RELEASE_TABLET | Freq: Three times a day (TID) | ORAL | Status: DC
  Administered 2017-01-25: 250 mg via ORAL
  Filled 2017-01-24 (×6): qty 1

## 2017-01-24 MED ORDER — ONDANSETRON HCL 4 MG/2ML IJ SOLN: 4.0000 mg | Freq: Four times a day (QID) | INTRAMUSCULAR | Status: DC | PRN

## 2017-01-24 MED ORDER — ACETAMINOPHEN 325 MG PO TABS: 650.0000 mg | ORAL_TABLET | Freq: Four times a day (QID) | ORAL | Status: DC | PRN

## 2017-01-24 MED ORDER — ASPIRIN 300 MG RE SUPP
300.0000 mg | Freq: Once | RECTAL | Status: AC
  Administered 2017-01-24: 300 mg via RECTAL
  Filled 2017-01-24: qty 1

## 2017-01-24 MED ORDER — ONDANSETRON HCL 4 MG PO TABS: 4.0000 mg | ORAL_TABLET | Freq: Four times a day (QID) | ORAL | Status: DC | PRN

## 2017-01-24 MED ORDER — MIRABEGRON ER 25 MG PO TB24
25.0000 mg | ORAL_TABLET | Freq: Every day | ORAL | Status: DC
Start: 2017-01-25 — End: 2017-01-27
  Administered 2017-01-25 – 2017-01-27 (×3): 25 mg via ORAL
  Filled 2017-01-24 (×3): qty 1

## 2017-01-24 NOTE — ED Notes (Signed)
Called floor to give report. RN on floor will call back when they have a room closer to nurse's desk for pt because pt has Hx of Dementia.

## 2017-01-24 NOTE — H&P (Signed)
[place] [facility] [place] NAME: Jamie Boyd    MR#:  [id number]  DATE OF BIRTH:  March 22, 1950  DATE OF ADMISSION:  01/24/2017  PRIMARY CARE PHYSICIAN: Sharyne Peach, MD   REQUESTING/REFERRING PHYSICIAN: Dr. Carrie Mew  CHIEF COMPLAINT:   Chief Complaint  Patient presents with  . Weakness    HISTORY OF PRESENT ILLNESS:  Jamie Boyd  is a 66 y.o. female with a known history of dementia, anemia who was recently in the hospital 3 days ago for failure to thrive and weakness was brought back in secondary to falls at home and right-sided weakness that was noticed last night. Due to her dementia, patient is unable to provide any history. Her niece-in-law at bedside and provides some history. According to her patient was discharged with home health 2 days ago, she has had near syncopal episode due to weakness over the last couple of days. She has been very shaky and ambulating since discharge. Last night they have noticed that she had trouble using her right leg which has pronounced this morning. No facial droop noted though family complains of slight right facial droop. No expressive aphasia at this time patient is able to name family member. Following simple commands. Trouble moving her right arm as it seems like she has some pain in her right shoulder. -CT head and CT angiogram of head and neck are negative for any acute findings. Being admitted for CVA work up.  PAST MEDICAL HISTORY:   Past Medical History:  Diagnosis Date  . Anemia   . Dementia   . Goiter   . Memory deficit     PAST SURGICAL HISTORY:   Past Surgical History:  Procedure Laterality Date  . COLONOSCOPY WITH PROPOFOL N/A 02/18/2015   Procedure: COLONOSCOPY WITH PROPOFOL;  Surgeon: Hulen Luster, MD;  Location: [facility] ENDOSCOPY;  Service: Gastroenterology;  Laterality: N/A;  . ESOPHAGOGASTRODUODENOSCOPY N/A 02/18/2015   Procedure: ESOPHAGOGASTRODUODENOSCOPY (EGD);  Surgeon:  Hulen Luster, MD;  Location: [facility] ENDOSCOPY;  Service: Gastroenterology;  Laterality: N/A;    SOCIAL HISTORY:   Social History   Tobacco Use  . Smoking status: Former Smoker    Types: Cigarettes    Last attempt to quit: 01/25/2014    Years since quitting: 3.0  . Smokeless tobacco: Never Used  Substance Use Topics  . Alcohol use: No    FAMILY HISTORY:   Family History  Problem Relation Age of Onset  . Diabetes Maternal Uncle   . Prostate cancer Neg Hx   . Bladder Cancer Neg Hx   . Kidney cancer Neg Hx     DRUG ALLERGIES:   Allergies  Allergen Reactions  . Pollen Extract Other (See Comments)    REVIEW OF SYSTEMS:   Review of Systems  Unable to perform ROS: Dementia    MEDICATIONS AT HOME:   Prior to Admission medications   Medication Sig Start Date End Date Taking? Authorizing Provider  divalproex (DEPAKOTE) 250 MG DR tablet Take 250 mg by mouth 3 (three) times daily.  11/23/15  Yes [provider]  donepezil (ARICEPT) 10 MG tablet Take 10 mg by mouth at bedtime.    Yes [provider]  ferrous sulfate 325 (65 FE) MG EC tablet Take 325 mg by mouth 3 (three) times daily with meals.   Yes [provider]  fexofenadine (ALLEGRA) 180 MG tablet Take 180 mg by mouth daily.  09/13/15 11/27/17 Yes [provider]  memantine (  NAMENDA) 10 MG tablet Take 10 mg by mouth 2 (two) times daily.  11/23/15 11/27/17 Yes [provider]  mirabegron ER (MYRBETRIQ) 25 MG TB24 tablet Take 25 mg by mouth daily.    Yes [provider]  Multiple Vitamins-Minerals (CENTRUM SILVER ULTRA WOMENS PO) Take 1 tablet by mouth daily.    Yes [provider]  QUEtiapine (SEROQUEL) 25 MG tablet Take 125 mg by mouth at bedtime 11/23/15  Yes [provider]  traZODone (DESYREL) 100 MG tablet Take 100 mg nightly at bedtime 04/11/16  Yes [provider]  estradiol (ESTRACE VAGINAL) 0.1 MG/GM vaginal cream Apply 0.5mg  (pea-sized  amount)  just inside the vaginal introitus with a finger-tip every night for two weeks and then Monday, Wednesday and Friday nights. 01/08/17   Zara Council A, PA-C      VITAL SIGNS:  Blood pressure 120/71, pulse 67, temperature 98.3 F (36.8 C), temperature source Oral, resp. rate 14, height 5\' 7"  (1.702 m), weight 66.2 kg (146 lb), SpO2 96 %.  PHYSICAL EXAMINATION:   Physical Exam  GENERAL:  66 y.o.-year-old patient lying in the bed with no acute distress.  EYES: Pupils equal, round, reactive to light and accommodation. No scleral icterus. Extraocular muscles intact.  HEENT: Head atraumatic, normocephalic. Oropharynx and nasopharynx clear.  NECK:  Supple, no jugular venous distention. No thyroid enlargement, no tenderness.  LUNGS: Normal breath sounds bilaterally, no wheezing, rales,rhonchi or crepitation. No use of accessory muscles of respiration. Decreased bibasilar breath sounds CARDIOVASCULAR: S1, S2 normal. No murmurs, rubs, or gallops.  ABDOMEN: Soft, nontender, nondistended. Bowel sounds present. No organomegaly or mass.  EXTREMITIES: No pedal edema, cyanosis, or clubbing.  NEUROLOGIC: Cranial nerves seemed to be intact, no facial droop noted. The strength is 5/5 in both left upper and lower extremities. In the right upper extremity the distal muscles beyond the elbow strength is 4/5 patient having trouble with the proximal muscles and also complains of pain in her shoulder though no swelling or tenderness noted. Right lower extremity strength is 3/5 at this time. Sensation seems to be intact.  PSYCHIATRIC: The patient is alert and oriented to self and family.  SKIN: No obvious rash, lesion, or ulcer.   LABORATORY PANEL:   CBC Recent Labs  Lab 01/24/17 1224  WBC 9.8  HGB 11.2*  HCT 33.5*  PLT 195   ------------------------------------------------------------------------------------------------------------------  Chemistries  Recent Labs  Lab 01/21/17 0500  01/24/17 1224  NA 139 134*  K 3.3* 4.0  CL 107 100*  CO2 28 28  GLUCOSE 87 95  BUN 12 14  CREATININE 0.78 0.74  CALCIUM 8.1* 9.2  MG 1.6*  --   AST 27  --   ALT 20  --   ALKPHOS 39  --   BILITOT 0.5  --    ------------------------------------------------------------------------------------------------------------------  Cardiac Enzymes Recent Labs  Lab 01/20/17 1050  TROPONINI <0.03   ------------------------------------------------------------------------------------------------------------------  RADIOLOGY:  Ct Angio Head W Or Wo Contrast  Result Date: 01/24/2017 CLINICAL DATA:  Weakness, possible TIA.  History of dementia. EXAM: CT ANGIOGRAPHY HEAD AND NECK CT PERFUSION BRAIN TECHNIQUE: Multidetector CT imaging of the head and neck was performed using the standard protocol during bolus administration of intravenous contrast. Multiplanar CT image reconstructions and MIPs were obtained to evaluate the vascular anatomy. Carotid stenosis measurements (when applicable) are obtained utilizing NASCET criteria, using the distal internal carotid diameter as the denominator. Multiphase CT imaging of the brain was performed following IV bolus contrast injection. Subsequent parametric  perfusion maps were calculated using RAPID software. CONTRAST:  128mL ISOVUE-370 IOPAMIDOL (ISOVUE-370) INJECTION 76% COMPARISON:  CT HEAD January 20, 2017 and January 24, 2017 FINDINGS: CTA NECK AORTIC ARCH: Normal appearance of the thoracic arch, normal branch pattern. The origins of the innominate, left Common carotid artery and subclavian artery are widely patent. RIGHT CAROTID SYSTEM: Common carotid artery is widely patent, coursing in a straight line fashion. Mild intimal calcific atherosclerosis carotid bifurcation without hemodynamically significant stenosis by NASCET criteria. Normal appearance of the internal carotid artery. LEFT CAROTID SYSTEM: Common carotid artery is widely patent, coursing in a  straight line fashion. Moderate atherosclerosis resulting in less than 50% stenosis by NASCET criteria. Normal appearance of the internal carotid artery. VERTEBRAL ARTERIES:RIGHT vertebral artery is dominant. Patent vertebral artery's with tortuosity associated with chronic hypertension. SKELETON: No acute osseous process though bone windows have not been submitted. Multilevel moderate to severe cervical spondylosis. Multiple absent teeth. RIGHT shoulder loose body, bursal collection and severe glenohumeral osteoarthrosis. OTHER NECK: Soft tissues of the neck are nonacute though, not tailored for evaluation. UPPER CHEST: Included lung apices are clear. No superior mediastinal lymphadenopathy. CTA HEAD ANTERIOR CIRCULATION: Patent cervical internal carotid arteries, petrous, cavernous and supra clinoid internal carotid arteries. Calcific atherosclerosis resulting in moderate stenosis LEFT cavernous internal carotid artery. Mild stenosis RIGHT cavernous carotid. 2 mm RIGHT posterior communicating artery origin medially directed intact aneurysm. Patent anterior communicating artery. Patent anterior and middle cerebral arteries, mild luminal irregularity compatible with atherosclerosis. No large vessel occlusion, significant stenosis, contrast extravasation or aneurysm. POSTERIOR CIRCULATION: Patent vertebral arteries, vertebrobasilar junction and basilar artery, as well as main branch vessels. Patent posterior cerebral arteries, mild luminal regularity compatible with atherosclerosis. No large vessel occlusion, significant stenosis, contrast extravasation or aneurysm. VENOUS SINUSES: Major dural venous sinuses are patent though not tailored for evaluation on this angiographic examination. ANATOMIC VARIANTS: Hypoplastic RIGHT A1 segment. DELAYED PHASE: No abnormal intracranial enhancement. MIP images reviewed. CT Brain Perfusion Findings: CBF (<30%) Volume: 22mL Perfusion (Tmax>6.0s) volume: 19mL Mismatch Volume: 54mL  IMPRESSION: CTA NECK: 1. Atherosclerosis without hemodynamically significant stenosis or acute vascular process. CTA HEAD: 1. No emergent large vessel occlusion. 2. Atherosclerosis resulting in moderate stenosis LEFT ICA. Mild cerebral artery atherosclerosis. 3. 2 mm RIGHT PCOM origin aneurysm. CT PERFUSION: 1. No acute perfusion abnormality. Electronically Signed   By: Elon Alas M.D.   On: 01/24/2017 17:10   Ct Head Wo Contrast  Result Date: 01/24/2017 CLINICAL DATA:  66 year old female with TIA symptoms EXAM: CT HEAD WITHOUT CONTRAST TECHNIQUE: Contiguous axial images were obtained from the base of the skull through the vertex without intravenous contrast. COMPARISON:  Prior head CT 01/20/2017 FINDINGS: Brain: No evidence of acute infarction, hemorrhage, hydrocephalus, extra-axial collection or mass lesion/mass effect. Stable appearance of global cerebral and cerebellar cortical atrophy, ex vacuo ventriculomegaly, and confluent periventricular white matter hypoattenuation. Vascular: No hyperdense vessel or unexpected calcification. Skull: Normal. Negative for fracture or focal lesion. Sinuses/Orbits: No acute finding. Other: None. IMPRESSION: 1. No acute intracranial abnormality. 2. Similar appearance of age advanced atrophy and chronic microvascular ischemic white matter disease. Electronically Signed   By: Jacqulynn Cadet M.D.   On: 01/24/2017 13:10   Ct Angio Neck W And/or Wo Contrast  Result Date: 01/24/2017 CLINICAL DATA:  Weakness, possible TIA.  History of dementia. EXAM: CT ANGIOGRAPHY HEAD AND NECK CT PERFUSION BRAIN TECHNIQUE: Multidetector CT imaging of the head and neck was performed using the standard protocol during bolus administration of intravenous contrast. Multiplanar CT  image reconstructions and MIPs were obtained to evaluate the vascular anatomy. Carotid stenosis measurements (when applicable) are obtained utilizing NASCET criteria, using the distal internal carotid  diameter as the denominator. Multiphase CT imaging of the brain was performed following IV bolus contrast injection. Subsequent parametric perfusion maps were calculated using RAPID software. CONTRAST:  140mL ISOVUE-370 IOPAMIDOL (ISOVUE-370) INJECTION 76% COMPARISON:  CT HEAD January 20, 2017 and January 24, 2017 FINDINGS: CTA NECK AORTIC ARCH: Normal appearance of the thoracic arch, normal branch pattern. The origins of the innominate, left Common carotid artery and subclavian artery are widely patent. RIGHT CAROTID SYSTEM: Common carotid artery is widely patent, coursing in a straight line fashion. Mild intimal calcific atherosclerosis carotid bifurcation without hemodynamically significant stenosis by NASCET criteria. Normal appearance of the internal carotid artery. LEFT CAROTID SYSTEM: Common carotid artery is widely patent, coursing in a straight line fashion. Moderate atherosclerosis resulting in less than 50% stenosis by NASCET criteria. Normal appearance of the internal carotid artery. VERTEBRAL ARTERIES:RIGHT vertebral artery is dominant. Patent vertebral artery's with tortuosity associated with chronic hypertension. SKELETON: No acute osseous process though bone windows have not been submitted. Multilevel moderate to severe cervical spondylosis. Multiple absent teeth. RIGHT shoulder loose body, bursal collection and severe glenohumeral osteoarthrosis. OTHER NECK: Soft tissues of the neck are nonacute though, not tailored for evaluation. UPPER CHEST: Included lung apices are clear. No superior mediastinal lymphadenopathy. CTA HEAD ANTERIOR CIRCULATION: Patent cervical internal carotid arteries, petrous, cavernous and supra clinoid internal carotid arteries. Calcific atherosclerosis resulting in moderate stenosis LEFT cavernous internal carotid artery. Mild stenosis RIGHT cavernous carotid. 2 mm RIGHT posterior communicating artery origin medially directed intact aneurysm. Patent anterior communicating  artery. Patent anterior and middle cerebral arteries, mild luminal irregularity compatible with atherosclerosis. No large vessel occlusion, significant stenosis, contrast extravasation or aneurysm. POSTERIOR CIRCULATION: Patent vertebral arteries, vertebrobasilar junction and basilar artery, as well as main branch vessels. Patent posterior cerebral arteries, mild luminal regularity compatible with atherosclerosis. No large vessel occlusion, significant stenosis, contrast extravasation or aneurysm. VENOUS SINUSES: Major dural venous sinuses are patent though not tailored for evaluation on this angiographic examination. ANATOMIC VARIANTS: Hypoplastic RIGHT A1 segment. DELAYED PHASE: No abnormal intracranial enhancement. MIP images reviewed. CT Brain Perfusion Findings: CBF (<30%) Volume: 51mL Perfusion (Tmax>6.0s) volume: 8mL Mismatch Volume: 7mL IMPRESSION: CTA NECK: 1. Atherosclerosis without hemodynamically significant stenosis or acute vascular process. CTA HEAD: 1. No emergent large vessel occlusion. 2. Atherosclerosis resulting in moderate stenosis LEFT ICA. Mild cerebral artery atherosclerosis. 3. 2 mm RIGHT PCOM origin aneurysm. CT PERFUSION: 1. No acute perfusion abnormality. Electronically Signed   By: Elon Alas M.D.   On: 01/24/2017 17:10   Ct Cerebral Perfusion W Contrast  Result Date: 01/24/2017 CLINICAL DATA:  Weakness, possible TIA.  History of dementia. EXAM: CT ANGIOGRAPHY HEAD AND NECK CT PERFUSION BRAIN TECHNIQUE: Multidetector CT imaging of the head and neck was performed using the standard protocol during bolus administration of intravenous contrast. Multiplanar CT image reconstructions and MIPs were obtained to evaluate the vascular anatomy. Carotid stenosis measurements (when applicable) are obtained utilizing NASCET criteria, using the distal internal carotid diameter as the denominator. Multiphase CT imaging of the brain was performed following IV bolus contrast injection.  Subsequent parametric perfusion maps were calculated using RAPID software. CONTRAST:  164mL ISOVUE-370 IOPAMIDOL (ISOVUE-370) INJECTION 76% COMPARISON:  CT HEAD January 20, 2017 and January 24, 2017 FINDINGS: CTA NECK AORTIC ARCH: Normal appearance of the thoracic arch, normal branch pattern. The origins of the innominate, left  Common carotid artery and subclavian artery are widely patent. RIGHT CAROTID SYSTEM: Common carotid artery is widely patent, coursing in a straight line fashion. Mild intimal calcific atherosclerosis carotid bifurcation without hemodynamically significant stenosis by NASCET criteria. Normal appearance of the internal carotid artery. LEFT CAROTID SYSTEM: Common carotid artery is widely patent, coursing in a straight line fashion. Moderate atherosclerosis resulting in less than 50% stenosis by NASCET criteria. Normal appearance of the internal carotid artery. VERTEBRAL ARTERIES:RIGHT vertebral artery is dominant. Patent vertebral artery's with tortuosity associated with chronic hypertension. SKELETON: No acute osseous process though bone windows have not been submitted. Multilevel moderate to severe cervical spondylosis. Multiple absent teeth. RIGHT shoulder loose body, bursal collection and severe glenohumeral osteoarthrosis. OTHER NECK: Soft tissues of the neck are nonacute though, not tailored for evaluation. UPPER CHEST: Included lung apices are clear. No superior mediastinal lymphadenopathy. CTA HEAD ANTERIOR CIRCULATION: Patent cervical internal carotid arteries, petrous, cavernous and supra clinoid internal carotid arteries. Calcific atherosclerosis resulting in moderate stenosis LEFT cavernous internal carotid artery. Mild stenosis RIGHT cavernous carotid. 2 mm RIGHT posterior communicating artery origin medially directed intact aneurysm. Patent anterior communicating artery. Patent anterior and middle cerebral arteries, mild luminal irregularity compatible with atherosclerosis. No  large vessel occlusion, significant stenosis, contrast extravasation or aneurysm. POSTERIOR CIRCULATION: Patent vertebral arteries, vertebrobasilar junction and basilar artery, as well as main branch vessels. Patent posterior cerebral arteries, mild luminal regularity compatible with atherosclerosis. No large vessel occlusion, significant stenosis, contrast extravasation or aneurysm. VENOUS SINUSES: Major dural venous sinuses are patent though not tailored for evaluation on this angiographic examination. ANATOMIC VARIANTS: Hypoplastic RIGHT A1 segment. DELAYED PHASE: No abnormal intracranial enhancement. MIP images reviewed. CT Brain Perfusion Findings: CBF (<30%) Volume: 78mL Perfusion (Tmax>6.0s) volume: 32mL Mismatch Volume: 21mL IMPRESSION: CTA NECK: 1. Atherosclerosis without hemodynamically significant stenosis or acute vascular process. CTA HEAD: 1. No emergent large vessel occlusion. 2. Atherosclerosis resulting in moderate stenosis LEFT ICA. Mild cerebral artery atherosclerosis. 3. 2 mm RIGHT PCOM origin aneurysm. CT PERFUSION: 1. No acute perfusion abnormality. Electronically Signed   By: Elon Alas M.D.   On: 01/24/2017 17:10    EKG:   Orders placed or performed during the hospital encounter of 01/24/17  . EKG 12-Lead  . EKG 12-Lead    IMPRESSION AND PLAN:   Jamie Boyd  is a 66 y.o. female with a known history of dementia, anemia who was recently in the hospital 3 days ago for failure to thrive and weakness was brought back in secondary to falls at home and right-sided weakness that was noticed last night.  1. Right-sided weakness-admitted to rule out CVA -MRI of the brain ordered, physical therapy, occupational therapy consult -Speech therapy consult -Started on aspirin and statin. Check lipid panel -The right shoulder weakness unsure if it's from pain. Check x-ray. If MRI of the brain is negative might need a right shoulder MRI to rule out any rotator cuff injury. -Neuro checks  and neuro consult ordered. -CT head, CT angiogram of head and neck reviewed. Echocardiogram ordered  2. Dementia-and depression. Seems to be pleasantly confused. -Continue home medications. Patient on Aricept and Namenda. -Depakote for mood stabilization. Also on Seroquel and trazodone.  3. Chronic anemia-stable. Continue iron supplements  4. DVT Prophylaxis-Lovenox    Apparently patient is a DO NOT RESUSCITATE based on recent discharge, brother is her power of attorney who is not available at this time. No signed DO NOT RESUSCITATE in the chart at this time. Please verify CODE STATUS on  admission.  All the records are reviewed and case discussed with ED provider. Management plans discussed with the patient, family and they are in agreement.  CODE STATUS: Full Code  TOTAL TIME TAKING CARE OF THIS PATIENT: 50 minutes.    Gladstone Lighter M.D on 01/24/2017 at 6:57 PM  Between 7am to 6pm - Pager - [phone]  After 6pm go to [email or web address] - password [email or web address] [facility]  Office  [phone]  CC: Primary care physician; Sharyne Peach, MD

## 2017-01-24 NOTE — ED Notes (Signed)
Caregiver at bedside

## 2017-01-24 NOTE — ED Triage Notes (Signed)
Pt arrived via EMS from d/t increased weakness. EMS reports family was suspicious of possible TIA symptoms. Pt is NAD at this time. Pt has hx of dementia. Pt denies any pain or numbness at this time.

## 2017-01-24 NOTE — ED Notes (Signed)
SLP notified and will see pt on floor

## 2017-01-24 NOTE — ED Provider Notes (Signed)
[facility] Emergency Department Provider Note  ____________________________________________  Time seen: Approximately 3:14 PM  I have reviewed the triage vital signs and the nursing notes.   HISTORY  Chief Complaint Weakness  Level 5 Caveat: Portions of the History and Physical were unable to be obtained due to altered mental status.   HPI Jamie Boyd is a 66 y.o. female brought to the ED with a report of altered mental status and weakness without any further history available at the present time. Patient is not able to offer any complaints.     Past Medical History:  Diagnosis Date  . Anemia   . Dementia   . Goiter   . Memory deficit      Patient Active Problem List   Diagnosis Date Noted  . Dehydration 01/20/2017  . Failure to thrive in adult 01/20/2017  . Hypotension 01/20/2017  . Agitation 11/23/2015  . Moderate dementia, with behavioral disturbance 11/23/2015  . Gastroesophageal reflux disease without esophagitis 09/11/2015  . Iron deficiency anemia due to chronic blood loss 06/02/2015  . GERD without esophagitis 06/01/2015  . Difficulty sleeping 04/27/2015  . Moderate dementia without behavioral disturbance 03/01/2015  . Acute UTI 01/29/2015  . Anemia 01/29/2015  . Memory deficit 01/29/2015     Past Surgical History:  Procedure Laterality Date  . COLONOSCOPY WITH PROPOFOL N/A 02/18/2015   Procedure: COLONOSCOPY WITH PROPOFOL;  Surgeon: Hulen Luster, MD;  Location: [facility] ENDOSCOPY;  Service: Gastroenterology;  Laterality: N/A;  . ESOPHAGOGASTRODUODENOSCOPY N/A 02/18/2015   Procedure: ESOPHAGOGASTRODUODENOSCOPY (EGD);  Surgeon: Hulen Luster, MD;  Location: [facility] ENDOSCOPY;  Service: Gastroenterology;  Laterality: N/A;     Prior to Admission medications   Medication Sig Start Date End Date Taking? Authorizing Provider  divalproex (DEPAKOTE) 250 MG DR tablet Take 250 mg by mouth 3 (three) times daily.  11/23/15  Yes [provider]  donepezil (ARICEPT) 10 MG tablet Take 10 mg by mouth at bedtime.    Yes [provider]  ferrous sulfate 325 (65 FE) MG EC tablet Take 325 mg by mouth 3 (three) times daily with meals.   Yes [provider]  fexofenadine (ALLEGRA) 180 MG tablet Take 180 mg by mouth daily.  09/13/15 11/27/17 Yes [provider]  memantine (NAMENDA) 10 MG tablet Take 10 mg by mouth 2 (two) times daily.  11/23/15 11/27/17 Yes [provider]  mirabegron ER (MYRBETRIQ) 25 MG TB24 tablet Take 25 mg by mouth daily.    Yes [provider]  Multiple Vitamins-Minerals (CENTRUM SILVER ULTRA WOMENS PO) Take 1 tablet by mouth daily.    Yes [provider]  QUEtiapine (SEROQUEL) 25 MG tablet Take 125 mg by mouth at bedtime 11/23/15  Yes [provider]  traZODone (DESYREL) 100 MG tablet Take 100 mg nightly at bedtime 04/11/16  Yes [provider]  estradiol (ESTRACE VAGINAL) 0.1 MG/GM vaginal cream Apply 0.5mg  (pea-sized amount)  just inside the vaginal introitus with a finger-tip every night for two weeks and then Monday, Wednesday and Friday nights. 01/08/17   Zara Council A, PA-C     Allergies Pollen extract   Family History  Problem Relation Age of Onset  . Prostate cancer Neg Hx   . Bladder Cancer Neg Hx   . Kidney cancer Neg Hx     Social History Social History   Tobacco Use  . Smoking status: Former Smoker    Types: Cigarettes    Last attempt to quit: 01/25/2014  Years since quitting: 3.0  . Smokeless tobacco: Never Used  Substance Use Topics  . Alcohol use: No  . Drug use: No    Review of Systems unable to obtain due to altered mental status ____________________________________________   PHYSICAL EXAM:  VITAL SIGNS: ED Triage Vitals  Enc Vitals Group     BP 01/24/17 1144 104/68     Pulse Rate 01/24/17 1144 67     Resp 01/24/17 1144 15     Temp 01/24/17 1144 98.3 F (36.8 C)     Temp Source 01/24/17 1144 Oral      SpO2 01/24/17 1144 95 %     Weight 01/24/17 1147 146 lb (66.2 kg)     Height 01/24/17 1147 5\' 7"  (1.702 m)     Head Circumference --      Peak Flow --      Pain Score 01/24/17 1144 0     Pain Loc --      Pain Edu? --      Excl. in [place]? --     Vital signs reviewed, nursing assessments reviewed.   Constitutional:   awake and alert, not oriented. not in distress Eyes:   No scleral icterus.  EOMI. No nystagmus. No conjunctival pallor. PERRL. ENT   Head:   Normocephalic and atraumatic.   Nose:   No congestion/rhinnorhea.    Mouth/Throat:   MMM, no pharyngeal erythema. No peritonsillar mass.    Neck:   No meningismus. Full ROM. Hematological/Lymphatic/Immunilogical:   No cervical lymphadenopathy. Cardiovascular:   RRR. Symmetric bilateral radial and DP pulses.  No murmurs.  Respiratory:   Normal respiratory effort without tachypnea/retractions. Breath sounds are clear and equal bilaterally. No wheezes/rales/rhonchi. Gastrointestinal:   Soft and nontender. Non distended. There is no CVA tenderness.  No rebound, rigidity, or guarding. Genitourinary:   deferred Musculoskeletal:   Normal range of motion in all extremities. No joint effusions.  No lower extremity tenderness.  No edema. Neurologic:   aphasic, can follow simple commands but difficulty with multi step tasks. Only verbal response is "yes" Severe weakness of the right arm unable lift off the bed. Right leg patient is able to lift off the bed but not able to maintain for more than a few seconds. Cranial nerves III through XII intact No neglect or forced visual field. NIH stroke scale 9.  Skin:    Skin is warm, dry and intact. No rash noted.  No petechiae, purpura, or bullae.  ____________________________________________    LABS (pertinent positives/negatives) (all labs ordered are listed, but only abnormal results are displayed) Labs Reviewed  BASIC METABOLIC PANEL - Abnormal; Notable for the following  components:      Result Value   Sodium 134 (*)    Chloride 100 (*)    All other components within normal limits  CBC WITH DIFFERENTIAL/PLATELET - Abnormal; Notable for the following components:   RBC 3.56 (*)    Hemoglobin 11.2 (*)    HCT 33.5 (*)    Monocytes Absolute 1.7 (*)    All other components within normal limits  URINALYSIS, COMPLETE (UACMP) WITH MICROSCOPIC - Abnormal; Notable for the following components:   Color, Urine STRAW (*)    APPearance CLEAR (*)    Ketones, ur 5 (*)    Leukocytes, UA TRACE (*)    All other components within normal limits  URINE CULTURE   ____________________________________________   EKG  Interpreted by me Sinus rhythm rate of 66, normal axis and intervals. Normal QRS ST  segments and T waves.  ____________________________________________    RADIOLOGY  Ct Angio Head W Or Wo Contrast  Result Date: 01/24/2017 CLINICAL DATA:  Weakness, possible TIA.  History of dementia. EXAM: CT ANGIOGRAPHY HEAD AND NECK CT PERFUSION BRAIN TECHNIQUE: Multidetector CT imaging of the head and neck was performed using the standard protocol during bolus administration of intravenous contrast. Multiplanar CT image reconstructions and MIPs were obtained to evaluate the vascular anatomy. Carotid stenosis measurements (when applicable) are obtained utilizing NASCET criteria, using the distal internal carotid diameter as the denominator. Multiphase CT imaging of the brain was performed following IV bolus contrast injection. Subsequent parametric perfusion maps were calculated using RAPID software. CONTRAST:  120mL ISOVUE-370 IOPAMIDOL (ISOVUE-370) INJECTION 76% COMPARISON:  CT HEAD January 20, 2017 and January 24, 2017 FINDINGS: CTA NECK AORTIC ARCH: Normal appearance of the thoracic arch, normal branch pattern. The origins of the innominate, left Common carotid artery and subclavian artery are widely patent. RIGHT CAROTID SYSTEM: Common carotid artery is widely patent,  coursing in a straight line fashion. Mild intimal calcific atherosclerosis carotid bifurcation without hemodynamically significant stenosis by NASCET criteria. Normal appearance of the internal carotid artery. LEFT CAROTID SYSTEM: Common carotid artery is widely patent, coursing in a straight line fashion. Moderate atherosclerosis resulting in less than 50% stenosis by NASCET criteria. Normal appearance of the internal carotid artery. VERTEBRAL ARTERIES:RIGHT vertebral artery is dominant. Patent vertebral artery's with tortuosity associated with chronic hypertension. SKELETON: No acute osseous process though bone windows have not been submitted. Multilevel moderate to severe cervical spondylosis. Multiple absent teeth. RIGHT shoulder loose body, bursal collection and severe glenohumeral osteoarthrosis. OTHER NECK: Soft tissues of the neck are nonacute though, not tailored for evaluation. UPPER CHEST: Included lung apices are clear. No superior mediastinal lymphadenopathy. CTA HEAD ANTERIOR CIRCULATION: Patent cervical internal carotid arteries, petrous, cavernous and supra clinoid internal carotid arteries. Calcific atherosclerosis resulting in moderate stenosis LEFT cavernous internal carotid artery. Mild stenosis RIGHT cavernous carotid. 2 mm RIGHT posterior communicating artery origin medially directed intact aneurysm. Patent anterior communicating artery. Patent anterior and middle cerebral arteries, mild luminal irregularity compatible with atherosclerosis. No large vessel occlusion, significant stenosis, contrast extravasation or aneurysm. POSTERIOR CIRCULATION: Patent vertebral arteries, vertebrobasilar junction and basilar artery, as well as main branch vessels. Patent posterior cerebral arteries, mild luminal regularity compatible with atherosclerosis. No large vessel occlusion, significant stenosis, contrast extravasation or aneurysm. VENOUS SINUSES: Major dural venous sinuses are patent though not  tailored for evaluation on this angiographic examination. ANATOMIC VARIANTS: Hypoplastic RIGHT A1 segment. DELAYED PHASE: No abnormal intracranial enhancement. MIP images reviewed. CT Brain Perfusion Findings: CBF (<30%) Volume: 9mL Perfusion (Tmax>6.0s) volume: 43mL Mismatch Volume: 38mL IMPRESSION: CTA NECK: 1. Atherosclerosis without hemodynamically significant stenosis or acute vascular process. CTA HEAD: 1. No emergent large vessel occlusion. 2. Atherosclerosis resulting in moderate stenosis LEFT ICA. Mild cerebral artery atherosclerosis. 3. 2 mm RIGHT PCOM origin aneurysm. CT PERFUSION: 1. No acute perfusion abnormality. Electronically Signed   By: Elon Alas M.D.   On: 01/24/2017 17:10   Ct Head Wo Contrast  Result Date: 01/24/2017 CLINICAL DATA:  66 year old female with TIA symptoms EXAM: CT HEAD WITHOUT CONTRAST TECHNIQUE: Contiguous axial images were obtained from the base of the skull through the vertex without intravenous contrast. COMPARISON:  Prior head CT 01/20/2017 FINDINGS: Brain: No evidence of acute infarction, hemorrhage, hydrocephalus, extra-axial collection or mass lesion/mass effect. Stable appearance of global cerebral and cerebellar cortical atrophy, ex vacuo ventriculomegaly, and confluent periventricular white matter hypoattenuation. Vascular:  No hyperdense vessel or unexpected calcification. Skull: Normal. Negative for fracture or focal lesion. Sinuses/Orbits: No acute finding. Other: None. IMPRESSION: 1. No acute intracranial abnormality. 2. Similar appearance of age advanced atrophy and chronic microvascular ischemic white matter disease. Electronically Signed   By: Jacqulynn Cadet M.D.   On: 01/24/2017 13:10   Ct Angio Neck W And/or Wo Contrast  Result Date: 01/24/2017 CLINICAL DATA:  Weakness, possible TIA.  History of dementia. EXAM: CT ANGIOGRAPHY HEAD AND NECK CT PERFUSION BRAIN TECHNIQUE: Multidetector CT imaging of the head and neck was performed using the  standard protocol during bolus administration of intravenous contrast. Multiplanar CT image reconstructions and MIPs were obtained to evaluate the vascular anatomy. Carotid stenosis measurements (when applicable) are obtained utilizing NASCET criteria, using the distal internal carotid diameter as the denominator. Multiphase CT imaging of the brain was performed following IV bolus contrast injection. Subsequent parametric perfusion maps were calculated using RAPID software. CONTRAST:  137mL ISOVUE-370 IOPAMIDOL (ISOVUE-370) INJECTION 76% COMPARISON:  CT HEAD January 20, 2017 and January 24, 2017 FINDINGS: CTA NECK AORTIC ARCH: Normal appearance of the thoracic arch, normal branch pattern. The origins of the innominate, left Common carotid artery and subclavian artery are widely patent. RIGHT CAROTID SYSTEM: Common carotid artery is widely patent, coursing in a straight line fashion. Mild intimal calcific atherosclerosis carotid bifurcation without hemodynamically significant stenosis by NASCET criteria. Normal appearance of the internal carotid artery. LEFT CAROTID SYSTEM: Common carotid artery is widely patent, coursing in a straight line fashion. Moderate atherosclerosis resulting in less than 50% stenosis by NASCET criteria. Normal appearance of the internal carotid artery. VERTEBRAL ARTERIES:RIGHT vertebral artery is dominant. Patent vertebral artery's with tortuosity associated with chronic hypertension. SKELETON: No acute osseous process though bone windows have not been submitted. Multilevel moderate to severe cervical spondylosis. Multiple absent teeth. RIGHT shoulder loose body, bursal collection and severe glenohumeral osteoarthrosis. OTHER NECK: Soft tissues of the neck are nonacute though, not tailored for evaluation. UPPER CHEST: Included lung apices are clear. No superior mediastinal lymphadenopathy. CTA HEAD ANTERIOR CIRCULATION: Patent cervical internal carotid arteries, petrous, cavernous and  supra clinoid internal carotid arteries. Calcific atherosclerosis resulting in moderate stenosis LEFT cavernous internal carotid artery. Mild stenosis RIGHT cavernous carotid. 2 mm RIGHT posterior communicating artery origin medially directed intact aneurysm. Patent anterior communicating artery. Patent anterior and middle cerebral arteries, mild luminal irregularity compatible with atherosclerosis. No large vessel occlusion, significant stenosis, contrast extravasation or aneurysm. POSTERIOR CIRCULATION: Patent vertebral arteries, vertebrobasilar junction and basilar artery, as well as main branch vessels. Patent posterior cerebral arteries, mild luminal regularity compatible with atherosclerosis. No large vessel occlusion, significant stenosis, contrast extravasation or aneurysm. VENOUS SINUSES: Major dural venous sinuses are patent though not tailored for evaluation on this angiographic examination. ANATOMIC VARIANTS: Hypoplastic RIGHT A1 segment. DELAYED PHASE: No abnormal intracranial enhancement. MIP images reviewed. CT Brain Perfusion Findings: CBF (<30%) Volume: 31mL Perfusion (Tmax>6.0s) volume: 80mL Mismatch Volume: 81mL IMPRESSION: CTA NECK: 1. Atherosclerosis without hemodynamically significant stenosis or acute vascular process. CTA HEAD: 1. No emergent large vessel occlusion. 2. Atherosclerosis resulting in moderate stenosis LEFT ICA. Mild cerebral artery atherosclerosis. 3. 2 mm RIGHT PCOM origin aneurysm. CT PERFUSION: 1. No acute perfusion abnormality. Electronically Signed   By: Elon Alas M.D.   On: 01/24/2017 17:10   Ct Cerebral Perfusion W Contrast  Result Date: 01/24/2017 CLINICAL DATA:  Weakness, possible TIA.  History of dementia. EXAM: CT ANGIOGRAPHY HEAD AND NECK CT PERFUSION BRAIN TECHNIQUE: Multidetector CT imaging of the  head and neck was performed using the standard protocol during bolus administration of intravenous contrast. Multiplanar CT image reconstructions and MIPs were  obtained to evaluate the vascular anatomy. Carotid stenosis measurements (when applicable) are obtained utilizing NASCET criteria, using the distal internal carotid diameter as the denominator. Multiphase CT imaging of the brain was performed following IV bolus contrast injection. Subsequent parametric perfusion maps were calculated using RAPID software. CONTRAST:  112mL ISOVUE-370 IOPAMIDOL (ISOVUE-370) INJECTION 76% COMPARISON:  CT HEAD January 20, 2017 and January 24, 2017 FINDINGS: CTA NECK AORTIC ARCH: Normal appearance of the thoracic arch, normal branch pattern. The origins of the innominate, left Common carotid artery and subclavian artery are widely patent. RIGHT CAROTID SYSTEM: Common carotid artery is widely patent, coursing in a straight line fashion. Mild intimal calcific atherosclerosis carotid bifurcation without hemodynamically significant stenosis by NASCET criteria. Normal appearance of the internal carotid artery. LEFT CAROTID SYSTEM: Common carotid artery is widely patent, coursing in a straight line fashion. Moderate atherosclerosis resulting in less than 50% stenosis by NASCET criteria. Normal appearance of the internal carotid artery. VERTEBRAL ARTERIES:RIGHT vertebral artery is dominant. Patent vertebral artery's with tortuosity associated with chronic hypertension. SKELETON: No acute osseous process though bone windows have not been submitted. Multilevel moderate to severe cervical spondylosis. Multiple absent teeth. RIGHT shoulder loose body, bursal collection and severe glenohumeral osteoarthrosis. OTHER NECK: Soft tissues of the neck are nonacute though, not tailored for evaluation. UPPER CHEST: Included lung apices are clear. No superior mediastinal lymphadenopathy. CTA HEAD ANTERIOR CIRCULATION: Patent cervical internal carotid arteries, petrous, cavernous and supra clinoid internal carotid arteries. Calcific atherosclerosis resulting in moderate stenosis LEFT cavernous internal  carotid artery. Mild stenosis RIGHT cavernous carotid. 2 mm RIGHT posterior communicating artery origin medially directed intact aneurysm. Patent anterior communicating artery. Patent anterior and middle cerebral arteries, mild luminal irregularity compatible with atherosclerosis. No large vessel occlusion, significant stenosis, contrast extravasation or aneurysm. POSTERIOR CIRCULATION: Patent vertebral arteries, vertebrobasilar junction and basilar artery, as well as main branch vessels. Patent posterior cerebral arteries, mild luminal regularity compatible with atherosclerosis. No large vessel occlusion, significant stenosis, contrast extravasation or aneurysm. VENOUS SINUSES: Major dural venous sinuses are patent though not tailored for evaluation on this angiographic examination. ANATOMIC VARIANTS: Hypoplastic RIGHT A1 segment. DELAYED PHASE: No abnormal intracranial enhancement. MIP images reviewed. CT Brain Perfusion Findings: CBF (<30%) Volume: 36mL Perfusion (Tmax>6.0s) volume: 30mL Mismatch Volume: 60mL IMPRESSION: CTA NECK: 1. Atherosclerosis without hemodynamically significant stenosis or acute vascular process. CTA HEAD: 1. No emergent large vessel occlusion. 2. Atherosclerosis resulting in moderate stenosis LEFT ICA. Mild cerebral artery atherosclerosis. 3. 2 mm RIGHT PCOM origin aneurysm. CT PERFUSION: 1. No acute perfusion abnormality. Electronically Signed   By: Elon Alas M.D.   On: 01/24/2017 17:10    ____________________________________________   PROCEDURES Procedures  CRITICAL CARE Performed by: Joni Fears, Lonita Debes   Total critical care time: 35 minutes  Critical care time was exclusive of separately billable procedures and treating other patients.  Critical care was necessary to treat or prevent imminent or life-threatening deterioration.  Critical care was time spent personally by me on the following activities: development of treatment plan with patient and/or surrogate as  well as nursing, discussions with consultants, evaluation of patient's response to treatment, examination of patient, obtaining history from patient or surrogate, ordering and performing treatments and interventions, ordering and review of laboratory studies, ordering and review of radiographic studies, pulse oximetry and re-evaluation of patient's condition.  ____________________________________________     CLINICAL IMPRESSION /  ASSESSMENT AND PLAN / ED COURSE  Pertinent labs & imaging results that were available during my care of the patient were reviewed by me and considered in my medical decision making (see chart for details).     Clinical Course as of Jan 24 1737  Wed Jan 24, 2017  1226 Pt p/w "increased weakness" by report. On exam has RUE weakness and slight RLE weakness. NIHSS 9 on my assessment, but limited by dementia, unclear acute vs chronic elements. Clinical ambiguity impedes MDM process until collateral information can be obtained, but pt is within 24 hour window of possible LVO management. Will obtain CT head to eval for stroke, check labs, ua.  [PS]  1525 W/u negative so far, d/w niece at bedside. Further history now available from family indicates that aphasia is new and that the patient's dementia is not that severe at baseline. Pt lives at home and normally participates in ADLs.  Within limitations of dementia, possible VAN screen positive with aphrasia. Onset 9pm last night, will d/w neuro regarding appropriateness of proceeding to CTA +/- CTP  [PS]  0240 D/w neuro, recommends CTA to eval for LVO. CTP not needed unless LVO positive.   [PS]  1728 CT a/p head negative. Will admit for stroke care.   [PS]    Clinical Course User Index [PS] Carrie Mew, MD     ----------------------------------------- 5:38 PM on 01/24/2017 -----------------------------------------  Case discussed with hospitalist for further  management  ____________________________________________   FINAL CLINICAL IMPRESSION(S) / ED DIAGNOSES    Final diagnoses:  Acute ischemic stroke [facility])      This SmartLink is deprecated. Use AVSMEDLIST instead to display the medication list for a patient.   Portions of this note were generated with dragon dictation software. Dictation errors may occur despite best attempts at proofreading.    Carrie Mew, MD 01/24/17 1739

## 2017-01-25 ENCOUNTER — Inpatient Hospital Stay (HOSPITAL_COMMUNITY)
Admit: 2017-01-25 | Discharge: 2017-01-25 | Disposition: A | Discharge status: Home / Self Care | Payer: 59 | Attending: Internal Medicine | Admitting: Internal Medicine

## 2017-01-25 ENCOUNTER — Inpatient Hospital Stay: Payer: 59

## 2017-01-25 DIAGNOSIS — R531 Weakness: Secondary | ICD-10-CM

## 2017-01-25 DIAGNOSIS — I9589 Other hypotension: Secondary | ICD-10-CM

## 2017-01-25 LAB — CBC
HEMATOCRIT: 31.9 % — AB (ref 35.0–47.0)
HEMOGLOBIN: 10.7 g/dL — AB (ref 12.0–16.0)
MCH: 31.6 pg (ref 26.0–34.0)
MCHC: 33.7 g/dL (ref 32.0–36.0)
MCV: 93.9 fL (ref 80.0–100.0)
Platelets: 188 10*3/uL (ref 150–440)
RBC: 3.4 MIL/uL — AB (ref 3.80–5.20)
RDW: 13.7 % (ref 11.5–14.5)
WBC: 9.1 10*3/uL (ref 3.6–11.0)

## 2017-01-25 LAB — BASIC METABOLIC PANEL
ANION GAP: 9 (ref 5–15)
BUN: 12 mg/dL (ref 6–20)
CHLORIDE: 103 mmol/L (ref 101–111)
CO2: 27 mmol/L (ref 22–32)
Calcium: 8.6 mg/dL — ABNORMAL LOW (ref 8.9–10.3)
Creatinine, Ser: 0.68 mg/dL (ref 0.44–1.00)
GFR calc non Af Amer: >60 mL/min (ref >60)
GLUCOSE: 82 mg/dL (ref 65–99)
Potassium: 3.9 mmol/L (ref 3.5–5.1)
Sodium: 139 mmol/L (ref 135–145)

## 2017-01-25 LAB — URINE CULTURE

## 2017-01-25 LAB — HEMOGLOBIN A1C
Hgb A1c MFr Bld: 5.7 % — ABNORMAL HIGH (ref 4.8–5.6)
Mean Plasma Glucose: 116.89 mg/dL

## 2017-01-25 MED ORDER — SODIUM CHLORIDE 0.9 % IV BOLUS (SEPSIS)
500.0000 mL | Freq: Once | INTRAVENOUS | Status: AC
  Administered 2017-01-25: 500 mL via INTRAVENOUS

## 2017-01-25 MED ORDER — INFLUENZA VAC SPLIT HIGH-DOSE 0.5 ML IM SUSY
0.5000 mL | PREFILLED_SYRINGE | INTRAMUSCULAR | Status: DC
  Filled 2017-01-25 (×3): qty 0.5

## 2017-01-25 NOTE — Consult Note (Signed)
Reason for Consult:Right sided weakness Referring Physician: Verdell Carmine  CC: Right sided weakness  HPI: Jamie Boyd is an 66 y.o. female with a history of dementia admitted recently for failure to thrive and weakness.  Discharged 12/17.  Presented on yesterday after being brought to the ED for weakness and AMS.  Patient unable to provide any history and all history obtained from the chart.  It appears that the night prior to presentation, family noticed that the patient was having trouble moving her RLE.  It was worse yesterday morning.  They felt she was having difficulty using her right arm as well and had some right facial droop, pocketing food.  Initial NIHSS of 3 due to orientation and arm weakness.    Past Medical History:  Diagnosis Date  . Anemia   . Dementia   . Goiter   . Memory deficit     Past Surgical History:  Procedure Laterality Date  . COLONOSCOPY WITH PROPOFOL N/A 02/18/2015   Procedure: COLONOSCOPY WITH PROPOFOL;  Surgeon: Hulen Luster, MD;  Location: [facility] ENDOSCOPY;  Service: Gastroenterology;  Laterality: N/A;  . ESOPHAGOGASTRODUODENOSCOPY N/A 02/18/2015   Procedure: ESOPHAGOGASTRODUODENOSCOPY (EGD);  Surgeon: Hulen Luster, MD;  Location: [facility] ENDOSCOPY;  Service: Gastroenterology;  Laterality: N/A;    Family History  Problem Relation Age of Onset  . Diabetes Maternal Uncle   . Prostate cancer Neg Hx   . Bladder Cancer Neg Hx   . Kidney cancer Neg Hx     Social History:  reports that she quit smoking about 3 years ago. Her smoking use included cigarettes. she has never used smokeless tobacco. She reports that she does not drink alcohol or use drugs.  Allergies  Allergen Reactions  . Pollen Extract Other (See Comments)    Medications:  I have reviewed the patient's current medications. Prior to Admission:  Medications Prior to Admission  Medication Sig Dispense Refill Last Dose  . divalproex (DEPAKOTE) 250 MG DR tablet Take 250 mg by mouth 3 (three) times daily.     01/24/2017 at AM  . donepezil (ARICEPT) 10 MG tablet Take 10 mg by mouth at bedtime.    01/23/2017 at HS  . ferrous sulfate 325 (65 FE) MG EC tablet Take 325 mg by mouth 3 (three) times daily with meals.   01/24/2017 at AM  . fexofenadine (ALLEGRA) 180 MG tablet Take 180 mg by mouth daily.    01/24/2017 at AM  . memantine (NAMENDA) 10 MG tablet Take 10 mg by mouth 2 (two) times daily.    01/24/2017 at AM  . mirabegron ER (MYRBETRIQ) 25 MG TB24 tablet Take 25 mg by mouth daily.    01/24/2017 at AM  . Multiple Vitamins-Minerals (CENTRUM SILVER ULTRA WOMENS PO) Take 1 tablet by mouth daily.    01/24/2017 at AM  . QUEtiapine (SEROQUEL) 25 MG tablet Take 125 mg by mouth at bedtime   01/23/2017 at HS  . traZODone (DESYREL) 100 MG tablet Take 100 mg nightly at bedtime   01/23/2017 at HS  . estradiol (ESTRACE VAGINAL) 0.1 MG/GM vaginal cream Apply 0.5mg  (pea-sized amount)  just inside the vaginal introitus with a finger-tip every night for two weeks and then Monday, Wednesday and Friday nights. 30 g 12 01/22/2017 at PM   Scheduled: . aspirin  300 mg Rectal Daily  . atorvastatin  40 mg Oral q1800  . divalproex  250 mg Oral TID  . donepezil  10 mg Oral QHS  . enoxaparin (LOVENOX) injection  40  mg Subcutaneous Q24H  . ferrous sulfate  325 mg Oral TID WC  . Influenza vac split quadrivalent PF  0.5 mL Intramuscular Tomorrow-1000  . loratadine  10 mg Oral Daily  . memantine  10 mg Oral BID  . mirabegron ER  25 mg Oral Daily  . multivitamin with minerals  1 tablet Oral Daily  . QUEtiapine  125 mg Oral QHS  . traZODone  100 mg Oral QHS    ROS: History obtained from the patient  General ROS: negative for - chills, fatigue, fever, night sweats, weight gain or weight loss Psychological ROS: negative for - behavioral disorder, hallucinations, memory difficulties, mood swings or suicidal ideation Ophthalmic ROS: negative for - blurry vision, double vision, eye pain or loss of vision ENT ROS: negative  for - epistaxis, nasal discharge, oral lesions, sore throat, tinnitus or vertigo Allergy and Immunology ROS: negative for - hives or itchy/watery eyes Hematological and Lymphatic ROS: negative for - bleeding problems, bruising or swollen lymph nodes Endocrine ROS: negative for - galactorrhea, hair pattern changes, polydipsia/polyuria or temperature intolerance Respiratory ROS: negative for - cough, hemoptysis, shortness of breath or wheezing Cardiovascular ROS: negative for - chest pain, dyspnea on exertion, edema or irregular heartbeat Gastrointestinal ROS: negative for - abdominal pain, diarrhea, hematemesis, nausea/vomiting or stool incontinence Genito-Urinary ROS: negative for - dysuria, hematuria, incontinence or urinary frequency/urgency Musculoskeletal ROS: right shoulder pain Neurological ROS: as noted in HPI Dermatological ROS: negative for rash and skin lesion changes  Physical Examination: Blood pressure 122/66, pulse 65, temperature 99.4 F (37.4 C), temperature source Oral, resp. rate 15, height 5\' 5"  (1.651 m), weight 63.2 kg (139 lb 4.8 oz), SpO2 100 %.  HEENT-  Normocephalic, no lesions, without obvious abnormality.  Normal external eye and conjunctiva.  Normal TM's bilaterally.  Normal auditory canals and external ears. Normal external nose, mucus membranes and septum.  Normal pharynx. Cardiovascular- S1, S2 normal, pulses palpable throughout   Lungs- chest clear, no wheezing, rales, normal symmetric air entry Abdomen- soft, non-tender; bowel sounds normal; no masses,  no organomegaly Extremities- no edema Lymph-no adenopathy palpable Musculoskeletal-tenderness on palpation of the right shoulder anteriorly.  Able to lift the arm actively with no pain but when patient attempts to lift unassisted with significant pain.   Skin-warm and dry, no hyperpigmentation, vitiligo, or suspicious lesions  Neurological Examination   Mental Status: Alert.  Speech fluent without evidence  of aphasia.  Able to follow 3 step commands with reinforcement. Cranial Nerves: II: Discs flat bilaterally; Visual fields grossly normal, pupils equal, round, reactive to light and accommodation III,IV, VI: ptosis not present, extra-ocular motions intact bilaterally V,VII: smile symmetric, facial light touch sensation normal bilaterally VIII: hearing normal bilaterally IX,X: gag reflex present XI: bilateral shoulder shrug XII: midline tongue extension Motor: Right : Upper extremity   5/5 distally with limitation at shoulder due to pain   Left:     Upper extremity   5/5  Lower extremity   4-/5     Lower extremity   4-/5 Tone and bulk:normal tone throughout; no atrophy noted Sensory: Pinprick and light touch intact throughout, bilaterally Deep Tendon Reflexes: 2+ and symmetric with absent AJs bilaterally Plantars: Right: upgoing   Left: upgoing Cerebellar: Normal finger-to-nose using the LUE, limited using the RUE due to pain, normal heel-to-shin testing bilaterally Gait: not tested due to safety concerns.     Laboratory Studies:   Basic Metabolic Panel: Recent Labs  Lab 01/20/17 0003 01/20/17 1050 01/21/17 0500 01/24/17 1224  01/25/17 0413  NA 145 144 139 134* 139  K 3.7 3.5 3.3* 4.0 3.9  CL 108 112* 107 100* 103  CO2 31 29 28 28 27   GLUCOSE 123* 102* 87 95 82  BUN 20 17 12 14 12   CREATININE 1.05* 0.89 0.78 0.74 0.68  CALCIUM 9.4 9.1 8.1* 9.2 8.6*  MG  --   --  1.6*  --   --     Liver Function Tests: Recent Labs  Lab 01/21/17 0500  AST 27  ALT 20  ALKPHOS 39  BILITOT 0.5  PROT 5.4*  ALBUMIN 2.6*   No results for input(s): LIPASE, AMYLASE in the last 168 hours. No results for input(s): AMMONIA in the last 168 hours.  CBC: Recent Labs  Lab 01/20/17 0003 01/20/17 1050 01/21/17 0500 01/24/17 1224 01/25/17 0413  WBC 7.9 7.6 8.5 9.8 9.1  NEUTROABS  --  4.9  --  6.5  --   HGB 11.3* 11.7* 10.7* 11.2* 10.7*  HCT 33.9* 34.8* 32.4* 33.5* 31.9*  MCV 94.9 94.2  95.7 94.2 93.9  PLT 213 207 179 195 188    Cardiac Enzymes: Recent Labs  Lab 01/20/17 0003 01/20/17 1050  TROPONINI <0.03 <0.03    BNP: Invalid input(s): POCBNP  CBG: No results for input(s): GLUCAP in the last 168 hours.  Microbiology: Results for orders placed or performed during the hospital encounter of 01/19/17  Urine Culture     Status: None   Collection Time: 01/20/17 12:03 AM  Result Value Ref Range Status   Specimen Description URINE, CATHETERIZED  Final   Special Requests Normal  Final   Culture   Final    NO GROWTH Performed at [facility], [address], [place], [place] [address]    Report Status 01/21/2017 FINAL  Final    Coagulation Studies: No results for input(s): LABPROT, INR in the last 72 hours.  Urinalysis:  Recent Labs  Lab 01/20/17 0003 01/24/17 1506  COLORURINE YELLOW* STRAW*  LABSPEC 1.015 1.005  PHURINE 6.0 7.0  GLUCOSEU NEGATIVE NEGATIVE  HGBUR SMALL* NEGATIVE  BILIRUBINUR NEGATIVE NEGATIVE  KETONESUR NEGATIVE 5*  PROTEINUR NEGATIVE NEGATIVE  NITRITE NEGATIVE NEGATIVE  LEUKOCYTESUR NEGATIVE TRACE*    Lipid Panel:     Component Value Date/Time   CHOL 217 (H) 01/24/2017 2033   TRIG 59 01/24/2017 2033   HDL 84 01/24/2017 2033   CHOLHDL 2.6 01/24/2017 2033   VLDL 12 01/24/2017 2033   LDLCALC 121 (H) 01/24/2017 2033    HgbA1C: No results found for: HGBA1C  Urine Drug Screen:      Component Value Date/Time   LABOPIA NONE DETECTED 10/29/2016 1813   COCAINSCRNUR NONE DETECTED 10/29/2016 1813   LABBENZ NONE DETECTED 10/29/2016 1813   AMPHETMU NONE DETECTED 10/29/2016 1813   THCU NONE DETECTED 10/29/2016 1813   LABBARB NONE DETECTED 10/29/2016 1813    Alcohol Level: No results for input(s): ETH in the last 168 hours.  Other results: EKG: sinus rhythm at 66 bpm.  Imaging: Ct Angio Head W Or Wo Contrast  Result Date: 01/24/2017 CLINICAL DATA:  Weakness, possible TIA.  History of dementia. EXAM: CT ANGIOGRAPHY  HEAD AND NECK CT PERFUSION BRAIN TECHNIQUE: Multidetector CT imaging of the head and neck was performed using the standard protocol during bolus administration of intravenous contrast. Multiplanar CT image reconstructions and MIPs were obtained to evaluate the vascular anatomy. Carotid stenosis measurements (when applicable) are obtained utilizing NASCET criteria, using the distal internal carotid diameter as the  denominator. Multiphase CT imaging of the brain was performed following IV bolus contrast injection. Subsequent parametric perfusion maps were calculated using RAPID software. CONTRAST:  138mL ISOVUE-370 IOPAMIDOL (ISOVUE-370) INJECTION 76% COMPARISON:  CT HEAD January 20, 2017 and January 24, 2017 FINDINGS: CTA NECK AORTIC ARCH: Normal appearance of the thoracic arch, normal branch pattern. The origins of the innominate, left Common carotid artery and subclavian artery are widely patent. RIGHT CAROTID SYSTEM: Common carotid artery is widely patent, coursing in a straight line fashion. Mild intimal calcific atherosclerosis carotid bifurcation without hemodynamically significant stenosis by NASCET criteria. Normal appearance of the internal carotid artery. LEFT CAROTID SYSTEM: Common carotid artery is widely patent, coursing in a straight line fashion. Moderate atherosclerosis resulting in less than 50% stenosis by NASCET criteria. Normal appearance of the internal carotid artery. VERTEBRAL ARTERIES:RIGHT vertebral artery is dominant. Patent vertebral artery's with tortuosity associated with chronic hypertension. SKELETON: No acute osseous process though bone windows have not been submitted. Multilevel moderate to severe cervical spondylosis. Multiple absent teeth. RIGHT shoulder loose body, bursal collection and severe glenohumeral osteoarthrosis. OTHER NECK: Soft tissues of the neck are nonacute though, not tailored for evaluation. UPPER CHEST: Included lung apices are clear. No superior mediastinal  lymphadenopathy. CTA HEAD ANTERIOR CIRCULATION: Patent cervical internal carotid arteries, petrous, cavernous and supra clinoid internal carotid arteries. Calcific atherosclerosis resulting in moderate stenosis LEFT cavernous internal carotid artery. Mild stenosis RIGHT cavernous carotid. 2 mm RIGHT posterior communicating artery origin medially directed intact aneurysm. Patent anterior communicating artery. Patent anterior and middle cerebral arteries, mild luminal irregularity compatible with atherosclerosis. No large vessel occlusion, significant stenosis, contrast extravasation or aneurysm. POSTERIOR CIRCULATION: Patent vertebral arteries, vertebrobasilar junction and basilar artery, as well as main branch vessels. Patent posterior cerebral arteries, mild luminal regularity compatible with atherosclerosis. No large vessel occlusion, significant stenosis, contrast extravasation or aneurysm. VENOUS SINUSES: Major dural venous sinuses are patent though not tailored for evaluation on this angiographic examination. ANATOMIC VARIANTS: Hypoplastic RIGHT A1 segment. DELAYED PHASE: No abnormal intracranial enhancement. MIP images reviewed. CT Brain Perfusion Findings: CBF (<30%) Volume: 61mL Perfusion (Tmax>6.0s) volume: 76mL Mismatch Volume: 77mL IMPRESSION: CTA NECK: 1. Atherosclerosis without hemodynamically significant stenosis or acute vascular process. CTA HEAD: 1. No emergent large vessel occlusion. 2. Atherosclerosis resulting in moderate stenosis LEFT ICA. Mild cerebral artery atherosclerosis. 3. 2 mm RIGHT PCOM origin aneurysm. CT PERFUSION: 1. No acute perfusion abnormality. Electronically Signed   By: Elon Alas M.D.   On: 01/24/2017 17:10   Dg Shoulder Right  Result Date: 01/24/2017 CLINICAL DATA:  Shoulder pain EXAM: RIGHT SHOULDER - 2+ VIEW COMPARISON:  None. FINDINGS: No fracture or malalignment. Moderate AC joint degenerative change. Moderate glenohumeral degenerative change. Effacement of the  subacromial space. IMPRESSION: 1. No fracture or malalignment 2. Effacement of the subacromial space consistent with rotator cuff disease 3. Moderate AC joint degenerative change Electronically Signed   By: Donavan Foil M.D.   On: 01/24/2017 19:18   Ct Head Wo Contrast  Result Date: 01/24/2017 CLINICAL DATA:  66 year old female with TIA symptoms EXAM: CT HEAD WITHOUT CONTRAST TECHNIQUE: Contiguous axial images were obtained from the base of the skull through the vertex without intravenous contrast. COMPARISON:  Prior head CT 01/20/2017 FINDINGS: Brain: No evidence of acute infarction, hemorrhage, hydrocephalus, extra-axial collection or mass lesion/mass effect. Stable appearance of global cerebral and cerebellar cortical atrophy, ex vacuo ventriculomegaly, and confluent periventricular white matter hypoattenuation. Vascular: No hyperdense vessel or unexpected calcification. Skull: Normal. Negative for fracture or focal lesion.  Sinuses/Orbits: No acute finding. Other: None. IMPRESSION: 1. No acute intracranial abnormality. 2. Similar appearance of age advanced atrophy and chronic microvascular ischemic white matter disease. Electronically Signed   By: Jacqulynn Cadet M.D.   On: 01/24/2017 13:10   Ct Angio Neck W And/or Wo Contrast  Result Date: 01/24/2017 CLINICAL DATA:  Weakness, possible TIA.  History of dementia. EXAM: CT ANGIOGRAPHY HEAD AND NECK CT PERFUSION BRAIN TECHNIQUE: Multidetector CT imaging of the head and neck was performed using the standard protocol during bolus administration of intravenous contrast. Multiplanar CT image reconstructions and MIPs were obtained to evaluate the vascular anatomy. Carotid stenosis measurements (when applicable) are obtained utilizing NASCET criteria, using the distal internal carotid diameter as the denominator. Multiphase CT imaging of the brain was performed following IV bolus contrast injection. Subsequent parametric perfusion maps were calculated using  RAPID software. CONTRAST:  184mL ISOVUE-370 IOPAMIDOL (ISOVUE-370) INJECTION 76% COMPARISON:  CT HEAD January 20, 2017 and January 24, 2017 FINDINGS: CTA NECK AORTIC ARCH: Normal appearance of the thoracic arch, normal branch pattern. The origins of the innominate, left Common carotid artery and subclavian artery are widely patent. RIGHT CAROTID SYSTEM: Common carotid artery is widely patent, coursing in a straight line fashion. Mild intimal calcific atherosclerosis carotid bifurcation without hemodynamically significant stenosis by NASCET criteria. Normal appearance of the internal carotid artery. LEFT CAROTID SYSTEM: Common carotid artery is widely patent, coursing in a straight line fashion. Moderate atherosclerosis resulting in less than 50% stenosis by NASCET criteria. Normal appearance of the internal carotid artery. VERTEBRAL ARTERIES:RIGHT vertebral artery is dominant. Patent vertebral artery's with tortuosity associated with chronic hypertension. SKELETON: No acute osseous process though bone windows have not been submitted. Multilevel moderate to severe cervical spondylosis. Multiple absent teeth. RIGHT shoulder loose body, bursal collection and severe glenohumeral osteoarthrosis. OTHER NECK: Soft tissues of the neck are nonacute though, not tailored for evaluation. UPPER CHEST: Included lung apices are clear. No superior mediastinal lymphadenopathy. CTA HEAD ANTERIOR CIRCULATION: Patent cervical internal carotid arteries, petrous, cavernous and supra clinoid internal carotid arteries. Calcific atherosclerosis resulting in moderate stenosis LEFT cavernous internal carotid artery. Mild stenosis RIGHT cavernous carotid. 2 mm RIGHT posterior communicating artery origin medially directed intact aneurysm. Patent anterior communicating artery. Patent anterior and middle cerebral arteries, mild luminal irregularity compatible with atherosclerosis. No large vessel occlusion, significant stenosis, contrast  extravasation or aneurysm. POSTERIOR CIRCULATION: Patent vertebral arteries, vertebrobasilar junction and basilar artery, as well as main branch vessels. Patent posterior cerebral arteries, mild luminal regularity compatible with atherosclerosis. No large vessel occlusion, significant stenosis, contrast extravasation or aneurysm. VENOUS SINUSES: Major dural venous sinuses are patent though not tailored for evaluation on this angiographic examination. ANATOMIC VARIANTS: Hypoplastic RIGHT A1 segment. DELAYED PHASE: No abnormal intracranial enhancement. MIP images reviewed. CT Brain Perfusion Findings: CBF (<30%) Volume: 20mL Perfusion (Tmax>6.0s) volume: 13mL Mismatch Volume: 60mL IMPRESSION: CTA NECK: 1. Atherosclerosis without hemodynamically significant stenosis or acute vascular process. CTA HEAD: 1. No emergent large vessel occlusion. 2. Atherosclerosis resulting in moderate stenosis LEFT ICA. Mild cerebral artery atherosclerosis. 3. 2 mm RIGHT PCOM origin aneurysm. CT PERFUSION: 1. No acute perfusion abnormality. Electronically Signed   By: Elon Alas M.D.   On: 01/24/2017 17:10   Mr Brain Wo Contrast  Result Date: 01/25/2017 CLINICAL DATA:  Right-sided weakness and multiple falls EXAM: MRI HEAD WITHOUT CONTRAST TECHNIQUE: Multiplanar, multiecho pulse sequences of the brain and surrounding structures were obtained without intravenous contrast. COMPARISON:  Head CT/CTA 01/24/2017 FINDINGS: Brain: The midline structures are  normal. There is no acute infarct or acute hemorrhage. No mass lesion, hydrocephalus, dural abnormality or extra-axial collection. There is multifocal white matter hyperintensity suggesting chronic ischemic microangiopathy. Age advanced atrophy without lobar predilection. No chronic microhemorrhage or superficial siderosis. Vascular: Major intracranial arterial and venous sinus flow voids are preserved. Skull and upper cervical spine: The visualized skull base, calvarium, upper  cervical spine and extracranial soft tissues are normal. Sinuses/Orbits: No fluid levels or advanced mucosal thickening. No mastoid or middle ear effusion. Normal orbits. IMPRESSION: Chronic ischemic microangiopathy and age advanced atrophy without acute intracranial abnormality. Electronically Signed   By: Ulyses Jarred M.D.   On: 01/25/2017 00:06   Ct Cerebral Perfusion W Contrast  Result Date: 01/24/2017 CLINICAL DATA:  Weakness, possible TIA.  History of dementia. EXAM: CT ANGIOGRAPHY HEAD AND NECK CT PERFUSION BRAIN TECHNIQUE: Multidetector CT imaging of the head and neck was performed using the standard protocol during bolus administration of intravenous contrast. Multiplanar CT image reconstructions and MIPs were obtained to evaluate the vascular anatomy. Carotid stenosis measurements (when applicable) are obtained utilizing NASCET criteria, using the distal internal carotid diameter as the denominator. Multiphase CT imaging of the brain was performed following IV bolus contrast injection. Subsequent parametric perfusion maps were calculated using RAPID software. CONTRAST:  136mL ISOVUE-370 IOPAMIDOL (ISOVUE-370) INJECTION 76% COMPARISON:  CT HEAD January 20, 2017 and January 24, 2017 FINDINGS: CTA NECK AORTIC ARCH: Normal appearance of the thoracic arch, normal branch pattern. The origins of the innominate, left Common carotid artery and subclavian artery are widely patent. RIGHT CAROTID SYSTEM: Common carotid artery is widely patent, coursing in a straight line fashion. Mild intimal calcific atherosclerosis carotid bifurcation without hemodynamically significant stenosis by NASCET criteria. Normal appearance of the internal carotid artery. LEFT CAROTID SYSTEM: Common carotid artery is widely patent, coursing in a straight line fashion. Moderate atherosclerosis resulting in less than 50% stenosis by NASCET criteria. Normal appearance of the internal carotid artery. VERTEBRAL ARTERIES:RIGHT vertebral  artery is dominant. Patent vertebral artery's with tortuosity associated with chronic hypertension. SKELETON: No acute osseous process though bone windows have not been submitted. Multilevel moderate to severe cervical spondylosis. Multiple absent teeth. RIGHT shoulder loose body, bursal collection and severe glenohumeral osteoarthrosis. OTHER NECK: Soft tissues of the neck are nonacute though, not tailored for evaluation. UPPER CHEST: Included lung apices are clear. No superior mediastinal lymphadenopathy. CTA HEAD ANTERIOR CIRCULATION: Patent cervical internal carotid arteries, petrous, cavernous and supra clinoid internal carotid arteries. Calcific atherosclerosis resulting in moderate stenosis LEFT cavernous internal carotid artery. Mild stenosis RIGHT cavernous carotid. 2 mm RIGHT posterior communicating artery origin medially directed intact aneurysm. Patent anterior communicating artery. Patent anterior and middle cerebral arteries, mild luminal irregularity compatible with atherosclerosis. No large vessel occlusion, significant stenosis, contrast extravasation or aneurysm. POSTERIOR CIRCULATION: Patent vertebral arteries, vertebrobasilar junction and basilar artery, as well as main branch vessels. Patent posterior cerebral arteries, mild luminal regularity compatible with atherosclerosis. No large vessel occlusion, significant stenosis, contrast extravasation or aneurysm. VENOUS SINUSES: Major dural venous sinuses are patent though not tailored for evaluation on this angiographic examination. ANATOMIC VARIANTS: Hypoplastic RIGHT A1 segment. DELAYED PHASE: No abnormal intracranial enhancement. MIP images reviewed. CT Brain Perfusion Findings: CBF (<30%) Volume: 21mL Perfusion (Tmax>6.0s) volume: 77mL Mismatch Volume: 93mL IMPRESSION: CTA NECK: 1. Atherosclerosis without hemodynamically significant stenosis or acute vascular process. CTA HEAD: 1. No emergent large vessel occlusion. 2. Atherosclerosis resulting in  moderate stenosis LEFT ICA. Mild cerebral artery atherosclerosis. 3. 2 mm RIGHT PCOM origin aneurysm.  CT PERFUSION: 1. No acute perfusion abnormality. Electronically Signed   By: Elon Alas M.D.   On: 01/24/2017 17:10     Assessment/Plan: 66 year old female presenting with what was described as right sided weakness.  By the time of presentation the only right sided weakness was attributed to pain.  Further neuro work up was performed.  MRI of the brain performed and shows no acute changes. CTA moderate stenosis of the left ICA and a 90m right Pcom aneurysm.  LDL 121.   Echocardiogram shows an EF of  60-65% with no cardiac source of emboli noted.   Shoulder films consistent with rotator cuff disease.    Recommendations: 1.  Agree with ASA daily 2.  A1c 3.  Patient to follow up with vascular concerning left ICA stenosis.   4.  Follow up MRI of the brain in 6-12 months for follow up of aneurysm 5.  Aggressive lipid management with target LDL<70.   6.  Telemetry 7.  Frequent neuro checks 8. Treatment for rotator cuff disease.    Alexis Goodell, MD Neurology [phone] 01/25/2017, 12:03 PM

## 2017-01-25 NOTE — Evaluation (Signed)
Occupational Therapy Evaluation Patient Details Name: Jamie Boyd MRN: [id number] DOB: March 07, 1950 Today's Date: 01/25/2017    History of Present Illness Pt is a 66 y.o.femalehas PMHx significant forprogressive dementia. Admitted for TIA. MRI negative for acute infarct.   Clinical Impression   Pt seen for OT evaluation this date. Pt pleasant, follows commands with additional time and cues to process, initiate, and sequence tasks. Pt able to follow 1 step commands inconsistently. Generally weak, R shoulder limited AROM due to RTC injury, although pt tends to compensate by incorporating LUE into tasks or defer to LUE altogether. Pt denies sensory deficits. No family members present to confirm home/PLOF information. Pt would benefit from skilled OT services to address cognitive deficits and strength deficits in order to minimize falls risk, injury, rehospitalization, and further functional decline or increased caregiver burden. Recommend HHOT services and 24/7 assist to address noted impairments and also focus on falls prevention, home safety assessment, and family/caregiver education and training in dementia care.     Follow Up Recommendations  Home health OT;Supervision/Assistance - 24 hour    Equipment Recommendations       Recommendations for Other Services       Precautions / Restrictions Precautions Precautions: Fall Restrictions Weight Bearing Restrictions: No      Mobility Bed Mobility               General bed mobility comments: pt up in recliner  Transfers Overall transfer level: Needs assistance Equipment used: Rolling walker (2 wheeled) Transfers: Sit to/from Stand Sit to Stand: Min guard              Balance Overall balance assessment: Needs assistance Sitting-balance support: Feet unsupported;Feet supported;No upper extremity supported Sitting balance-Leahy Scale: Good     Standing balance support: Bilateral upper extremity supported Standing  balance-Leahy Scale: Good                             ADL either performed or assessed with clinical judgement   ADL Overall ADL's : Needs assistance/impaired Eating/Feeding: Sitting;Set up   Grooming: Sitting;Cueing for sequencing;Set up   Upper Body Bathing: Sitting;Minimal assistance;Moderate assistance;Cueing for sequencing   Lower Body Bathing: Sit to/from stand;Moderate assistance;Cueing for safety   Upper Body Dressing : Sitting;Minimal assistance;Cueing for sequencing   Lower Body Dressing: Sit to/from stand;Moderate assistance;Cueing for sequencing   Toilet Transfer: RW;Min guard;Comfort height toilet;Cueing for safety;Cueing for sequencing             General ADL Comments: requires cuing for sequencing and safety, additional time to process and initiate     Vision Baseline Vision/History: Wears glasses(no family to confirm) Wears Glasses: At all times Patient Visual Report: No change from baseline Vision Assessment?: No apparent visual deficits     Perception     Praxis      Pertinent Vitals/Pain Pain Assessment: No/denies pain     Hand Dominance     Extremity/Trunk Assessment Upper Extremity Assessment Upper Extremity Assessment: Generalized weakness;RUE deficits/detail;Difficult to assess due to impaired cognition(LUE at least 3+/5) RUE Deficits / Details: chart noting R RTC injury, pt with limited active shoulder ROM, compensates using LUE to assist RUE: Unable to fully assess due to pain   Lower Extremity Assessment Lower Extremity Assessment: Defer to PT evaluation;Generalized weakness   Cervical / Trunk Assessment Cervical / Trunk Assessment: Normal   Communication Communication Communication: No difficulties   Cognition Arousal/Alertness: Awake/alert Behavior During Therapy: [facility] for tasks  assessed/performed Overall Cognitive Status: No family/caregiver present to determine baseline cognitive functioning                                  General Comments: Pt with diagnosis of dementia but no family present to determine baseline cognition   General Comments       Exercises     Shoulder Instructions      Home Living Family/patient expects to be discharged to:: Private residence Living Arrangements: Other relatives(pt states she lives with her brother)   Type of Home: House Home Access: Stairs to enter [employer] of Steps: pt unable to state how many                       Additional Comments: Per chart review pt requires full assist with ADLs and has a caregiver 7 days/wk for 3-4 hrs/day.        Prior Functioning/Environment Level of Independence: Needs assistance  Gait / Transfers Assistance Needed: unsure, no family present ADL's / Homemaking Assistance Needed: Per chart review pt requires full assist with ADLs and has a caregiver 7 days/wk for 3-4 hrs/day.  However, pt states she is independent with all ADL            OT Problem List: Decreased knowledge of use of DME or AE;Decreased strength;Decreased cognition;Impaired UE functional use;Decreased safety awareness      OT Treatment/Interventions: Self-care/ADL training;Therapeutic exercise;Therapeutic activities;Cognitive remediation/compensation;Patient/family education    OT Goals(Current goals can be found in the care plan section) Acute Rehab OT Goals Patient Stated Goal: pt did not state OT Goal Formulation: Patient unable to participate in goal setting  OT Frequency: Min 2X/week   Barriers to D/C:            Co-evaluation              AM-PAC PT "6 Clicks" Daily Activity     Outcome Measure Help from another person eating meals?: A Little Help from another person taking care of personal grooming?: A Little Help from another person toileting, which includes using toliet, bedpan, or urinal?: A Little Help from another person bathing (including washing, rinsing, drying)?: A Lot Help from  another person to put on and taking off regular upper body clothing?: A Little Help from another person to put on and taking off regular lower body clothing?: A Lot 6 Click Score: 16   End of Session    Activity Tolerance: Patient tolerated treatment well Patient left: in chair;with call bell/phone within reach;with chair alarm set  OT Visit Diagnosis: Other abnormalities of gait and mobility (R26.89);History of falling (Z91.81);Muscle weakness (generalized) (M62.81);Other symptoms and signs involving cognitive function                Time: 1110-1120 OT Time Calculation (min): 10 min Charges:  OT General Charges $OT Visit: 1 Visit OT Evaluation $OT Eval Moderate Complexity: 1 Mod G-Codes: OT G-codes **NOT FOR INPATIENT CLASS** Functional Assessment Tool Used: AM-PAC 6 Clicks Daily Activity;Clinical judgement Functional Limitation: Self care Self Care Current Status (C1275): At least 40 percent but less than 60 percent impaired, limited or restricted Self Care Goal Status (T7001): At least 20 percent but less than 40 percent impaired, limited or restricted   Jeni Salles, MPH, MS, OTR/L ascom [phone] 01/25/17, 11:44 AM

## 2017-01-25 NOTE — Evaluation (Signed)
Physical Therapy Evaluation Patient Details Name: Jamie Boyd MRN: [id number] DOB: 1950-11-06 Today's Date: 01/25/2017   History of Present Illness  Pt is a 66 y.o. female presenting to hospital with progressive weakness, AMS, falls at home, and R sided weakness.  Recent hospital stay for failure to thrive and weakness.  Admitted for stroke work-up.  MRI negative for acute infarct.  R shoulder imaging negative for fx.  PMH includes moderate dementia with behavioral disturbance, UTI, anemia.  Clinical Impression  Prior to hospital admission, pt appears to have been ambulatory.  Pt reports living with her brother.  Pt with PMH of dementia; no family present to verify accuracy of pt's answers.  Currently pt is min assist supine to sit, CGA with transfers, and CGA to SBA ambulating with RW around nursing loop.  Pt with R shoulder impairments (pt with very limited shoulder AROM flexion) but pt did use R UE to push through to scoot to edge of bed and to stand without any noticeable pain behavior (pt also reporting no pain with these activities).  Pt did well with simple one step commands (inconsistent with multiple step commands).   Pt would benefit from skilled PT to address noted impairments and functional limitations (see below for any additional details).  Upon hospital discharge, recommend pt discharge to home with SBA for functional mobility for safety and use of RW.    Follow Up Recommendations Home health PT;Supervision for mobility/OOB    Equipment Recommendations  Rolling walker with 5" wheels    Recommendations for Other Services       Precautions / Restrictions Precautions Precautions: Fall Restrictions Weight Bearing Restrictions: No      Mobility  Bed Mobility Overal bed mobility: Needs Assistance Bed Mobility: Supine to Sit     Supine to sit: Min assist;HOB elevated     General bed mobility comments: min assist for trunk supine to sit; use of L UE on bed  rail  Transfers Overall transfer level: Needs assistance Equipment used: Rolling walker (2 wheeled) Transfers: Sit to/from Stand Sit to Stand: Min guard         General transfer comment: fairly strong stand and controlled sitting with use of RW  Ambulation/Gait Ambulation/Gait assistance: Min guard;Supervision Ambulation Distance (Feet): 200 Feet Assistive device: Rolling walker (2 wheeled) Gait Pattern/deviations: Step-through pattern;Decreased step length - right;Decreased step length - left Gait velocity: decreased   General Gait Details: steady ambulation with RW; occasional vc's to stay away from wall in hallway  Stairs            Wheelchair Mobility    Modified Rankin (Stroke Patients Only)       Balance Overall balance assessment: Needs assistance Sitting-balance support: Feet supported;No upper extremity supported Sitting balance-Leahy Scale: Fair Sitting balance - Comments: good static sitting balance     Standing balance-Leahy Scale: Poor Standing balance comment: requires at least single UE support for static standing balance                             Pertinent Vitals/Pain Pain Assessment: Faces Faces Pain Scale: No hurt  Vitals (HR and O2 on room air) stable and WFL throughout treatment session.    Home Living Family/patient expects to be discharged to:: Private residence Living Arrangements: Other relatives(Pt reports she lives with her brother) Available Help at Discharge: Family Type of Home: House Home Access: Stairs to enter Entrance Stairs-Rails: Left Entrance Stairs-Number of Steps: pt  unable to state how many Home Layout: One level   Additional Comments: Per chart review pt requires full assist with ADLs and has a caregiver 7 days/wk for 3-4 hrs/day.      Prior Function Level of Independence: Needs assistance   Gait / Transfers Assistance Needed: Pt reports that she normally doesn't use an AD but then reporting using  RW.  No family present to verify information.  ADL's / Homemaking Assistance Needed: Per chart review pt requires full assist with ADLs and has a caregiver 7 days/wk for 3-4 hrs/day.        Hand Dominance        Extremity/Trunk Assessment   Upper Extremity Assessment Upper Extremity Assessment: Defer to OT evaluation RUE Deficits / Details: Chart noting possible R RTC injury, pt with very limited active shoulder ROM, compensates using LUE to assist RUE: Unable to fully assess due to pain    Lower Extremity Assessment Lower Extremity Assessment: Difficult to assess due to impaired cognition(at least 4/5 B LE hip flexion, knee flexion/extension, and DF)    Cervical / Trunk Assessment Cervical / Trunk Assessment: Normal  Communication   Communication: No difficulties  Cognition Arousal/Alertness: Awake/alert Behavior During Therapy: WFL for tasks assessed/performed Overall Cognitive Status: No family/caregiver present to determine baseline cognitive functioning(Oriented to person)                                 General Comments: Pt with diagnosis of dementia but no family present to determine baseline cognition      General Comments General comments (skin integrity, edema, etc.): Pt resting in bed upon PT arrival.  Pt agreeable to PT session.    Exercises     Assessment/Plan    PT Assessment Patient needs continued PT services  PT Problem List Decreased strength;Decreased balance;Decreased mobility       PT Treatment Interventions DME instruction;Gait training;Stair training;Functional mobility training;Therapeutic activities;Therapeutic exercise;Balance training;Patient/family education    PT Goals (Current goals can be found in the Care Plan section)  Acute Rehab PT Goals Patient Stated Goal: to walk more PT Goal Formulation: With patient Time For Goal Achievement: 02/08/17 Potential to Achieve Goals: Good    Frequency Min 2X/week   Barriers  to discharge        Co-evaluation               AM-PAC PT "6 Clicks" Daily Activity  Outcome Measure Difficulty turning over in bed (including adjusting bedclothes, sheets and blankets)?: A Little Difficulty moving from lying on back to sitting on the side of the bed? : Unable Difficulty sitting down on and standing up from a chair with arms (e.g., wheelchair, bedside commode, etc,.)?: Unable Help needed moving to and from a bed to chair (including a wheelchair)?: A Little Help needed walking in hospital room?: A Little Help needed climbing 3-5 steps with a railing? : A Little 6 Click Score: 14    End of Session Equipment Utilized During Treatment: Gait belt Activity Tolerance: Patient tolerated treatment well Patient left: in chair;with call bell/phone within reach;with chair alarm set Nurse Communication: Mobility status;Precautions PT Visit Diagnosis: Other abnormalities of gait and mobility (R26.89);Muscle weakness (generalized) (M62.81);History of falling (Z91.81)    Time: 1660-6301 PT Time Calculation (min) (ACUTE ONLY): 27 min   Charges:   PT Evaluation $PT Eval Low Complexity: 1 Low PT Treatments $Therapeutic Exercise: 8-22 mins   PT G Codes:  PT G-Codes **NOT FOR INPATIENT CLASS** Functional Assessment Tool Used: AM-PAC 6 Clicks Basic Mobility Functional Limitation: Mobility: Walking and moving around Mobility: Walking and Moving Around Current Status (K5625): At least 40 percent but less than 60 percent impaired, limited or restricted Mobility: Walking and Moving Around Goal Status [phone]): At least 20 percent but less than 40 percent impaired, limited or restricted    Leitha Bleak, PT 01/25/17, 4:05 PM [phone]

## 2017-01-25 NOTE — Care Management (Signed)
Patient is active with [facility] for RN, PT, OT, ST, HHA and SW.

## 2017-01-25 NOTE — Evaluation (Signed)
Clinical/Bedside Swallow Evaluation Patient Details  Name: Jamie Boyd MRN: [id number] Date of Birth: 01-16-1951  Today's Date: 01/25/2017 Time: SLP Start Time (ACUTE ONLY): 1045 SLP Stop Time (ACUTE ONLY): 1130 SLP Time Calculation (min) (ACUTE ONLY): 45 min  Past Medical History:  Past Medical History:  Diagnosis Date  . Anemia   . Dementia   . Goiter   . Memory deficit    Past Surgical History:  Past Surgical History:  Procedure Laterality Date  . COLONOSCOPY WITH PROPOFOL N/A 02/18/2015   Procedure: COLONOSCOPY WITH PROPOFOL;  Surgeon: Hulen Luster, MD;  Location: [facility] ENDOSCOPY;  Service: Gastroenterology;  Laterality: N/A;  . ESOPHAGOGASTRODUODENOSCOPY N/A 02/18/2015   Procedure: ESOPHAGOGASTRODUODENOSCOPY (EGD);  Surgeon: Hulen Luster, MD;  Location: [facility] ENDOSCOPY;  Service: Gastroenterology;  Laterality: N/A;   HPI:  Pt is a 66 y.o. female with a known history of dementia, anemia who was recently in the hospital 3 days ago for failure to thrive and weakness was brought back in secondary to falls at home and right-sided weakness that was noticed last night. Due to her dementia, patient is unable to provide any history. Her niece-in-law at bedside and provides some history. According to her patient was discharged with home health 2 days ago, she has had near syncopal episode due to weakness over the last couple of days. She has been very shaky and ambulating since discharge. Last night they have noticed that she had trouble using her right leg which has pronounced this morning. No facial droop noted though family complains of slight right facial droop. No expressive aphasia at this time patient is able to name family member. Following simple commands. Trouble moving her right arm as it seems like she has some pain in her right shoulder - report of rotator cuff deficits.    Assessment / Plan / Recommendation Clinical Impression  Pt appeared to present w/ adequate oropharyngeal phase  swallow function w/ adequate bolus management for transfer/clearing, then swallowing. No overt s/s of aspiration were noted w/ any bolus trial given. Pt assisted in feeding self but needed preparation and setup assistance d/t min reduced ROM movement in the R shoulder for self-feeding. Pt consumed trials of thin liquids, purees and softened solids (increased textured foods were broken down for easier masticaiton secondary to missing lower molars/dentition; has an upper denture plate in place). Pt masticated then transferred bolus trials, swallowed, and cleared orally. Pt consumed thin liquids via cup/straw w/ no overt s/s of aspiration noted. When given food prep and setup(placing the foods in front of her opened and ready to eat), pt picked each up and began feeding herself consuming all foods/liquids offered. Pt nodded she would like a lunch meal "soon". As pt appears at her baseline for swallowing w/ a mech soft diet consistency and thin liquids, recommend such w/ general aspiration precautions. Pt would BENEFIT from having 100% support at ALL meals in order to increase her oral intake and not have to stress regarding having to prepare or open/cut foods/containers. Having Dementia can impact follow through w/ such tasks thus reduce participation in the activity. Recommend a Dietician consult for nutritional supplements to benefit her diet. No further skilled ST services indicated at this time as pt appears at her baseline. NSG to reconsult if any decline in status while admitted. NSG updated.  SLP Visit Diagnosis: Dysphagia, oral phase (R13.11)    Aspiration Risk  (reduced following aspiration precautions)    Diet Recommendation  Dysphagia level 3(w/ well-cut/minced meats) w/  Thin liquids; general aspiration precautions; tray setup and support at meals for follow through w/ eating at meals - food preparation  Medication Administration: Crushed with puree(as needed for safer, easier swallowing d/t  Dementia)    Other  Recommendations Recommended Consults: (Dietician f/u) Oral Care Recommendations: Oral care BID;Staff/trained caregiver to provide oral care;Patient independent with oral care Other Recommendations: (n/a)   Follow up Recommendations None      Frequency and Duration (n/a)  (n/a)       Prognosis Prognosis for Safe Diet Advancement: Good Barriers to Reach Goals: Cognitive deficits;Severity of deficits      Swallow Study   General Date of Onset: 01/24/17 HPI: Pt is a 66 y.o. female with a known history of dementia, anemia who was recently in the hospital 3 days ago for failure to thrive and weakness was brought back in secondary to falls at home and right-sided weakness that was noticed last night. Due to her dementia, patient is unable to provide any history. Her niece-in-law at bedside and provides some history. According to her patient was discharged with home health 2 days ago, she has had near syncopal episode due to weakness over the last couple of days. She has been very shaky and ambulating since discharge. Last night they have noticed that she had trouble using her right leg which has pronounced this morning. No facial droop noted though family complains of slight right facial droop. No expressive aphasia at this time patient is able to name family member. Following simple commands. Trouble moving her right arm as it seems like she has some pain in her right shoulder - report of rotator cuff deficits.  Type of Study: Bedside Swallow Evaluation Previous Swallow Assessment: none reported Diet Prior to this Study: Regular;Thin liquids(at home; NPO since admit) Temperature Spikes Noted: (wbc 9.1;  temp 99.7) Respiratory Status: Room air History of Recent Intubation: No Behavior/Cognition: Alert;Cooperative;Pleasant mood;Confused;Distractible;Requires cueing(baseline Dementia) Oral Cavity Assessment: Dry Oral Care Completed by SLP: Recent completion by staff Oral  Cavity - Dentition: Dentures, top;Missing dentition(few lower front teeth) Vision: Functional for self-feeding Self-Feeding Abilities: Able to feed self;Needs assist;Needs set up(R shoulder deficits) Patient Positioning: Upright in chair Baseline Vocal Quality: Low vocal intensity(almost mumbled speech) Volitional Cough: Cognitively unable to elicit Volitional Swallow: Unable to elicit    Oral/Motor/Sensory Function Overall Oral Motor/Sensory Function: Within functional limits   Ice Chips Ice chips: Within functional limits Presentation: Spoon(fed; 3 trials)   Thin Liquid Thin Liquid: Within functional limits Presentation: Cup;Self Fed;Straw(~8 ozs total)    Nectar Thick Nectar Thick Liquid: Not tested   Honey Thick Honey Thick Liquid: Not tested   Puree Puree: Within functional limits Presentation: Self Fed;Spoon(4 ozs)   Solid   GO   Solid: Impaired(mech soft foods) Presentation: Self Fed;Spoon(5 trials) Oral Phase Impairments: Impaired mastication(missing lower dentition/molars) Oral Phase Functional Implications: Impaired mastication Pharyngeal Phase Impairments: (none) Other Comments: moistened and broke down the foods    Functional Assessment Tool Used: clinical judgement Functional Limitations: Swallowing Swallow Current Status (S5681): At least 1 percent but less than 20 percent impaired, limited or restricted Swallow Goal Status [phone]): At least 1 percent but less than 20 percent impaired, limited or restricted Swallow Discharge Status [phone]): At least 1 percent but less than 20 percent impaired, limited or restricted     Orinda Kenner, MS, CCC-SLP Watson,Katherine 01/25/2017,11:36 AM

## 2017-01-25 NOTE — Progress Notes (Signed)
*  PRELIMINARY RESULTS* Echocardiogram 2D Echocardiogram has been performed.  Sherrie Sport 01/25/2017, 9:59 AM

## 2017-01-25 NOTE — Progress Notes (Signed)
PT Cancellation Note  Patient Details Name: Jamie Boyd MRN: [id number] DOB: [birth year]   Cancelled Treatment:    Reason Eval/Treat Not Completed: Patient at procedure or test/unavailable.  Transport present to take pt to ECHO.  Will re-attempt PT evaluation at a later date/time.  Leitha Bleak, PT 01/25/17, 9:21 AM [phone]

## 2017-01-25 NOTE — Progress Notes (Signed)
[facility] NAME: Jamie Boyd    MR#:  [id number]  DATE OF BIRTH:  19-Aug-1950  SUBJECTIVE:   Patient here due to suspected right-sided weakness and stroke. Recently discharge from the hospital due to dementia with failure to thrive and poor by mouth intake. Patient's workup for CVA has been negative so far. Patient sitting up in chair eating lunch. Family at bedside. No other acute events overnight.  REVIEW OF SYSTEMS:    Review of Systems  Unable to perform ROS: Mental acuity    Nutrition: Heart Healthy dysphagia III, thin Tolerating Diet: Yes Tolerating PT: Eval noted.   DRUG ALLERGIES:   Allergies  Allergen Reactions  . Pollen Extract Other (See Comments)    VITALS:  Blood pressure 122/66, pulse 65, temperature 99.4 F (37.4 C), temperature source Oral, resp. rate 15, height 5\' 5"  (1.651 m), weight 63.2 kg (139 lb 4.8 oz), SpO2 100 %.  PHYSICAL EXAMINATION:   Physical Exam  GENERAL:  66 y.o.-year-old patient lying in bed in no acute distress.  EYES: Pupils equal, round, reactive to light and accommodation. No scleral icterus. Extraocular muscles intact.  HEENT: Head atraumatic, normocephalic. Oropharynx and nasopharynx clear.  NECK:  Supple, no jugular venous distention. No thyroid enlargement, no tenderness.  LUNGS: Normal breath sounds bilaterally, no wheezing, rales, rhonchi. No use of accessory muscles of respiration.  CARDIOVASCULAR: S1, S2 normal. No murmurs, rubs, or gallops.  ABDOMEN: Soft, nontender, nondistended. Bowel sounds present. No organomegaly or mass.  EXTREMITIES: No cyanosis, clubbing or edema b/l.    NEUROLOGIC: Cranial nerves II through XII are intact. No focal Motor or sensory deficits b/l. Globally weak  PSYCHIATRIC: The patient is alert and oriented x 1 SKIN: No obvious rash, lesion, or ulcer.    LABORATORY PANEL:   CBC Recent Labs  Lab 01/25/17 0413  WBC 9.1  HGB 10.7*  HCT 31.9*   PLT 188   ------------------------------------------------------------------------------------------------------------------  Chemistries  Recent Labs  Lab 01/21/17 0500  01/25/17 0413  NA 139   < > 139  K 3.3*   < > 3.9  CL 107   < > 103  CO2 28   < > 27  GLUCOSE 87   < > 82  BUN 12   < > 12  CREATININE 0.78   < > 0.68  CALCIUM 8.1*   < > 8.6*  MG 1.6*  --   --   AST 27  --   --   ALT 20  --   --   ALKPHOS 39  --   --   BILITOT 0.5  --   --    < > = values in this interval not displayed.   ------------------------------------------------------------------------------------------------------------------  Cardiac Enzymes Recent Labs  Lab 01/20/17 1050  TROPONINI <0.03   ------------------------------------------------------------------------------------------------------------------  RADIOLOGY:  Ct Angio Head W Or Wo Contrast  Result Date: 01/24/2017 CLINICAL DATA:  Weakness, possible TIA.  History of dementia. EXAM: CT ANGIOGRAPHY HEAD AND NECK CT PERFUSION BRAIN TECHNIQUE: Multidetector CT imaging of the head and neck was performed using the standard protocol during bolus administration of intravenous contrast. Multiplanar CT image reconstructions and MIPs were obtained to evaluate the vascular anatomy. Carotid stenosis measurements (when applicable) are obtained utilizing NASCET criteria, using the distal internal carotid diameter as the denominator. Multiphase CT imaging of the brain was performed following IV bolus contrast injection. Subsequent parametric perfusion maps were calculated using RAPID software. CONTRAST:  156mL ISOVUE-370  IOPAMIDOL (ISOVUE-370) INJECTION 76% COMPARISON:  CT HEAD January 20, 2017 and January 24, 2017 FINDINGS: CTA NECK AORTIC ARCH: Normal appearance of the thoracic arch, normal branch pattern. The origins of the innominate, left Common carotid artery and subclavian artery are widely patent. RIGHT CAROTID SYSTEM: Common carotid artery is  widely patent, coursing in a straight line fashion. Mild intimal calcific atherosclerosis carotid bifurcation without hemodynamically significant stenosis by NASCET criteria. Normal appearance of the internal carotid artery. LEFT CAROTID SYSTEM: Common carotid artery is widely patent, coursing in a straight line fashion. Moderate atherosclerosis resulting in less than 50% stenosis by NASCET criteria. Normal appearance of the internal carotid artery. VERTEBRAL ARTERIES:RIGHT vertebral artery is dominant. Patent vertebral artery's with tortuosity associated with chronic hypertension. SKELETON: No acute osseous process though bone windows have not been submitted. Multilevel moderate to severe cervical spondylosis. Multiple absent teeth. RIGHT shoulder loose body, bursal collection and severe glenohumeral osteoarthrosis. OTHER NECK: Soft tissues of the neck are nonacute though, not tailored for evaluation. UPPER CHEST: Included lung apices are clear. No superior mediastinal lymphadenopathy. CTA HEAD ANTERIOR CIRCULATION: Patent cervical internal carotid arteries, petrous, cavernous and supra clinoid internal carotid arteries. Calcific atherosclerosis resulting in moderate stenosis LEFT cavernous internal carotid artery. Mild stenosis RIGHT cavernous carotid. 2 mm RIGHT posterior communicating artery origin medially directed intact aneurysm. Patent anterior communicating artery. Patent anterior and middle cerebral arteries, mild luminal irregularity compatible with atherosclerosis. No large vessel occlusion, significant stenosis, contrast extravasation or aneurysm. POSTERIOR CIRCULATION: Patent vertebral arteries, vertebrobasilar junction and basilar artery, as well as main branch vessels. Patent posterior cerebral arteries, mild luminal regularity compatible with atherosclerosis. No large vessel occlusion, significant stenosis, contrast extravasation or aneurysm. VENOUS SINUSES: Major dural venous sinuses are patent  though not tailored for evaluation on this angiographic examination. ANATOMIC VARIANTS: Hypoplastic RIGHT A1 segment. DELAYED PHASE: No abnormal intracranial enhancement. MIP images reviewed. CT Brain Perfusion Findings: CBF (<30%) Volume: 77mL Perfusion (Tmax>6.0s) volume: 39mL Mismatch Volume: 55mL IMPRESSION: CTA NECK: 1. Atherosclerosis without hemodynamically significant stenosis or acute vascular process. CTA HEAD: 1. No emergent large vessel occlusion. 2. Atherosclerosis resulting in moderate stenosis LEFT ICA. Mild cerebral artery atherosclerosis. 3. 2 mm RIGHT PCOM origin aneurysm. CT PERFUSION: 1. No acute perfusion abnormality. Electronically Signed   By: Elon Alas M.D.   On: 01/24/2017 17:10   Dg Shoulder Right  Result Date: 01/24/2017 CLINICAL DATA:  Shoulder pain EXAM: RIGHT SHOULDER - 2+ VIEW COMPARISON:  None. FINDINGS: No fracture or malalignment. Moderate AC joint degenerative change. Moderate glenohumeral degenerative change. Effacement of the subacromial space. IMPRESSION: 1. No fracture or malalignment 2. Effacement of the subacromial space consistent with rotator cuff disease 3. Moderate AC joint degenerative change Electronically Signed   By: Donavan Foil M.D.   On: 01/24/2017 19:18   Ct Head Wo Contrast  Result Date: 01/24/2017 CLINICAL DATA:  66 year old female with TIA symptoms EXAM: CT HEAD WITHOUT CONTRAST TECHNIQUE: Contiguous axial images were obtained from the base of the skull through the vertex without intravenous contrast. COMPARISON:  Prior head CT 01/20/2017 FINDINGS: Brain: No evidence of acute infarction, hemorrhage, hydrocephalus, extra-axial collection or mass lesion/mass effect. Stable appearance of global cerebral and cerebellar cortical atrophy, ex vacuo ventriculomegaly, and confluent periventricular white matter hypoattenuation. Vascular: No hyperdense vessel or unexpected calcification. Skull: Normal. Negative for fracture or focal lesion. Sinuses/Orbits:  No acute finding. Other: None. IMPRESSION: 1. No acute intracranial abnormality. 2. Similar appearance of age advanced atrophy and chronic microvascular ischemic white matter disease.  Electronically Signed   By: Jacqulynn Cadet M.D.   On: 01/24/2017 13:10   Ct Angio Neck W And/or Wo Contrast  Result Date: 01/24/2017 CLINICAL DATA:  Weakness, possible TIA.  History of dementia. EXAM: CT ANGIOGRAPHY HEAD AND NECK CT PERFUSION BRAIN TECHNIQUE: Multidetector CT imaging of the head and neck was performed using the standard protocol during bolus administration of intravenous contrast. Multiplanar CT image reconstructions and MIPs were obtained to evaluate the vascular anatomy. Carotid stenosis measurements (when applicable) are obtained utilizing NASCET criteria, using the distal internal carotid diameter as the denominator. Multiphase CT imaging of the brain was performed following IV bolus contrast injection. Subsequent parametric perfusion maps were calculated using RAPID software. CONTRAST:  110mL ISOVUE-370 IOPAMIDOL (ISOVUE-370) INJECTION 76% COMPARISON:  CT HEAD January 20, 2017 and January 24, 2017 FINDINGS: CTA NECK AORTIC ARCH: Normal appearance of the thoracic arch, normal branch pattern. The origins of the innominate, left Common carotid artery and subclavian artery are widely patent. RIGHT CAROTID SYSTEM: Common carotid artery is widely patent, coursing in a straight line fashion. Mild intimal calcific atherosclerosis carotid bifurcation without hemodynamically significant stenosis by NASCET criteria. Normal appearance of the internal carotid artery. LEFT CAROTID SYSTEM: Common carotid artery is widely patent, coursing in a straight line fashion. Moderate atherosclerosis resulting in less than 50% stenosis by NASCET criteria. Normal appearance of the internal carotid artery. VERTEBRAL ARTERIES:RIGHT vertebral artery is dominant. Patent vertebral artery's with tortuosity associated with chronic  hypertension. SKELETON: No acute osseous process though bone windows have not been submitted. Multilevel moderate to severe cervical spondylosis. Multiple absent teeth. RIGHT shoulder loose body, bursal collection and severe glenohumeral osteoarthrosis. OTHER NECK: Soft tissues of the neck are nonacute though, not tailored for evaluation. UPPER CHEST: Included lung apices are clear. No superior mediastinal lymphadenopathy. CTA HEAD ANTERIOR CIRCULATION: Patent cervical internal carotid arteries, petrous, cavernous and supra clinoid internal carotid arteries. Calcific atherosclerosis resulting in moderate stenosis LEFT cavernous internal carotid artery. Mild stenosis RIGHT cavernous carotid. 2 mm RIGHT posterior communicating artery origin medially directed intact aneurysm. Patent anterior communicating artery. Patent anterior and middle cerebral arteries, mild luminal irregularity compatible with atherosclerosis. No large vessel occlusion, significant stenosis, contrast extravasation or aneurysm. POSTERIOR CIRCULATION: Patent vertebral arteries, vertebrobasilar junction and basilar artery, as well as main branch vessels. Patent posterior cerebral arteries, mild luminal regularity compatible with atherosclerosis. No large vessel occlusion, significant stenosis, contrast extravasation or aneurysm. VENOUS SINUSES: Major dural venous sinuses are patent though not tailored for evaluation on this angiographic examination. ANATOMIC VARIANTS: Hypoplastic RIGHT A1 segment. DELAYED PHASE: No abnormal intracranial enhancement. MIP images reviewed. CT Brain Perfusion Findings: CBF (<30%) Volume: 11mL Perfusion (Tmax>6.0s) volume: 32mL Mismatch Volume: 110mL IMPRESSION: CTA NECK: 1. Atherosclerosis without hemodynamically significant stenosis or acute vascular process. CTA HEAD: 1. No emergent large vessel occlusion. 2. Atherosclerosis resulting in moderate stenosis LEFT ICA. Mild cerebral artery atherosclerosis. 3. 2 mm RIGHT PCOM  origin aneurysm. CT PERFUSION: 1. No acute perfusion abnormality. Electronically Signed   By: Elon Alas M.D.   On: 01/24/2017 17:10   Mr Brain Wo Contrast  Result Date: 01/25/2017 CLINICAL DATA:  Right-sided weakness and multiple falls EXAM: MRI HEAD WITHOUT CONTRAST TECHNIQUE: Multiplanar, multiecho pulse sequences of the brain and surrounding structures were obtained without intravenous contrast. COMPARISON:  Head CT/CTA 01/24/2017 FINDINGS: Brain: The midline structures are normal. There is no acute infarct or acute hemorrhage. No mass lesion, hydrocephalus, dural abnormality or extra-axial collection. There is multifocal white matter hyperintensity suggesting chronic  ischemic microangiopathy. Age advanced atrophy without lobar predilection. No chronic microhemorrhage or superficial siderosis. Vascular: Major intracranial arterial and venous sinus flow voids are preserved. Skull and upper cervical spine: The visualized skull base, calvarium, upper cervical spine and extracranial soft tissues are normal. Sinuses/Orbits: No fluid levels or advanced mucosal thickening. No mastoid or middle ear effusion. Normal orbits. IMPRESSION: Chronic ischemic microangiopathy and age advanced atrophy without acute intracranial abnormality. Electronically Signed   By: Ulyses Jarred M.D.   On: 01/25/2017 00:06   Ct Cerebral Perfusion W Contrast  Result Date: 01/24/2017 CLINICAL DATA:  Weakness, possible TIA.  History of dementia. EXAM: CT ANGIOGRAPHY HEAD AND NECK CT PERFUSION BRAIN TECHNIQUE: Multidetector CT imaging of the head and neck was performed using the standard protocol during bolus administration of intravenous contrast. Multiplanar CT image reconstructions and MIPs were obtained to evaluate the vascular anatomy. Carotid stenosis measurements (when applicable) are obtained utilizing NASCET criteria, using the distal internal carotid diameter as the denominator. Multiphase CT imaging of the brain was  performed following IV bolus contrast injection. Subsequent parametric perfusion maps were calculated using RAPID software. CONTRAST:  16mL ISOVUE-370 IOPAMIDOL (ISOVUE-370) INJECTION 76% COMPARISON:  CT HEAD January 20, 2017 and January 24, 2017 FINDINGS: CTA NECK AORTIC ARCH: Normal appearance of the thoracic arch, normal branch pattern. The origins of the innominate, left Common carotid artery and subclavian artery are widely patent. RIGHT CAROTID SYSTEM: Common carotid artery is widely patent, coursing in a straight line fashion. Mild intimal calcific atherosclerosis carotid bifurcation without hemodynamically significant stenosis by NASCET criteria. Normal appearance of the internal carotid artery. LEFT CAROTID SYSTEM: Common carotid artery is widely patent, coursing in a straight line fashion. Moderate atherosclerosis resulting in less than 50% stenosis by NASCET criteria. Normal appearance of the internal carotid artery. VERTEBRAL ARTERIES:RIGHT vertebral artery is dominant. Patent vertebral artery's with tortuosity associated with chronic hypertension. SKELETON: No acute osseous process though bone windows have not been submitted. Multilevel moderate to severe cervical spondylosis. Multiple absent teeth. RIGHT shoulder loose body, bursal collection and severe glenohumeral osteoarthrosis. OTHER NECK: Soft tissues of the neck are nonacute though, not tailored for evaluation. UPPER CHEST: Included lung apices are clear. No superior mediastinal lymphadenopathy. CTA HEAD ANTERIOR CIRCULATION: Patent cervical internal carotid arteries, petrous, cavernous and supra clinoid internal carotid arteries. Calcific atherosclerosis resulting in moderate stenosis LEFT cavernous internal carotid artery. Mild stenosis RIGHT cavernous carotid. 2 mm RIGHT posterior communicating artery origin medially directed intact aneurysm. Patent anterior communicating artery. Patent anterior and middle cerebral arteries, mild luminal  irregularity compatible with atherosclerosis. No large vessel occlusion, significant stenosis, contrast extravasation or aneurysm. POSTERIOR CIRCULATION: Patent vertebral arteries, vertebrobasilar junction and basilar artery, as well as main branch vessels. Patent posterior cerebral arteries, mild luminal regularity compatible with atherosclerosis. No large vessel occlusion, significant stenosis, contrast extravasation or aneurysm. VENOUS SINUSES: Major dural venous sinuses are patent though not tailored for evaluation on this angiographic examination. ANATOMIC VARIANTS: Hypoplastic RIGHT A1 segment. DELAYED PHASE: No abnormal intracranial enhancement. MIP images reviewed. CT Brain Perfusion Findings: CBF (<30%) Volume: 45mL Perfusion (Tmax>6.0s) volume: 76mL Mismatch Volume: 71mL IMPRESSION: CTA NECK: 1. Atherosclerosis without hemodynamically significant stenosis or acute vascular process. CTA HEAD: 1. No emergent large vessel occlusion. 2. Atherosclerosis resulting in moderate stenosis LEFT ICA. Mild cerebral artery atherosclerosis. 3. 2 mm RIGHT PCOM origin aneurysm. CT PERFUSION: 1. No acute perfusion abnormality. Electronically Signed   By: Elon Alas M.D.   On: 01/24/2017 17:10     ASSESSMENT AND  PLAN:   Naomy Esham  is a 66 y.o. female with a known history of dementia, anemia who was recently in the hospital 3 days ago for failure to thrive and weakness was brought back in secondary to falls at home and right-sided weakness that was noticed last night.  1. Right-sided weakness-admitted to rule out CVA -Patient's neurologic workup so far has been negative. Patient's MRI did not show any acute CVA. Patient CT angiogram of the neck suggestive of some mild left carotid artery stenosis which can be followed up as outpatient with vascular. -Continue aspirin, statin. -Appreciate speech eval and started on dysphagia 3 diet. Await physical therapy and occupational therapy evaluation. -Await echo  results.  2. Right shoulder pain-as per the family patient has been having some pain in the right shoulder. X-ray of the right shoulder showed some DJD and possible rotator cuff injury. -We'll get MRI of the right shoulder.  3. Dementia-and depression. Seems to be pleasantly confused. -Continue home medications. Cont. Aricept and Namenda. - cont. Depakote, Seroquel, Trazodone.   3. Chronic anemia-stable. Continue iron supplements   All the records are reviewed and case discussed with Care Management/Social Worker. Management plans discussed with the patient, family and they are in agreement.  CODE STATUS: Full code  DVT Prophylaxis: Lovenox  TOTAL TIME TAKING CARE OF THIS PATIENT: 30 minutes.   POSSIBLE D/C IN 1-2 DAYS, DEPENDING ON CLINICAL CONDITION.   Henreitta Leber M.D on 01/25/2017 at 1:36 PM  Between 7am to 6pm - Pager - [phone]  After 6pm go to [email or web address] - Proofreader  [facility]  Office  [phone]  CC: Primary care physician; Sharyne Peach, MD

## 2017-01-25 NOTE — Progress Notes (Signed)
OT Cancellation Note  Patient Details Name: Jamie Boyd MRN: [id number] DOB: 1950/10/09   Cancelled Treatment:    Reason Eval/Treat Not Completed: Patient at procedure or test/ unavailable. Order received, chart reviewed. Pt out of room for ECHO. Will re-attempt OT evaluation at later date/time as pt is available and medically appropriate.  Jeni Salles, MPH, MS, OTR/L ascom [phone] 01/25/17, 9:34 AM

## 2017-01-26 MED ORDER — SODIUM CHLORIDE 0.9 % IV BOLUS (SEPSIS)
1000.0000 mL | Freq: Once | INTRAVENOUS | Status: AC
  Administered 2017-01-26: 10:00:00 1000 mL via INTRAVENOUS

## 2017-01-26 MED ORDER — ASPIRIN 81 MG PO CHEW
324.0000 mg | CHEWABLE_TABLET | Freq: Every day | ORAL | Status: DC
  Administered 2017-01-26 – 2017-01-27 (×2): 324 mg via ORAL
  Filled 2017-01-26 (×3): qty 4

## 2017-01-26 MED ORDER — DIVALPROEX SODIUM 125 MG PO CSDR
250.0000 mg | DELAYED_RELEASE_CAPSULE | Freq: Three times a day (TID) | ORAL | Status: DC
  Administered 2017-01-27 (×3): 250 mg via ORAL
  Filled 2017-01-26 (×6): qty 2

## 2017-01-26 NOTE — Progress Notes (Signed)
Physical Therapy Treatment Patient Details Name: Jamie Boyd MRN: [id number] DOB: Jun 08, 1950 Today's Date: 01/26/2017    History of Present Illness Pt is a 66 y.o. female presenting to hospital with progressive weakness, AMS, falls at home, and R sided weakness.  Recent hospital stay for failure to thrive and weakness.  Admitted for stroke work-up.  MRI negative for acute infarct.  R shoulder imaging negative for fx.  PMH includes moderate dementia with behavioral disturbance, UTI, anemia.    PT Comments    Pt able to ambulate around nursing loop with RW CGA again today.  Of note, pt's BP supine 111/55; sitting 106/63; standing 91/61; and post ambulation 99/57.  Nursing notified of pt's BP during session.  After sitting pt up on edge of bed, pt noted to have been incontinent of urine in bed and required assist for clean-up and to don new brief (nursing notified and came to change pt's bed).  No c/o dizziness or R UE pain during session. Will continue to focus on strengthening and functional mobility during hospital stay.   Follow Up Recommendations  Home health PT;Supervision for mobility/OOB     Equipment Recommendations  Rolling walker with 5" wheels    Recommendations for Other Services       Precautions / Restrictions Precautions Precautions: Fall Restrictions Weight Bearing Restrictions: No    Mobility  Bed Mobility Overal bed mobility: Needs Assistance Bed Mobility: Supine to Sit     Supine to sit: Min assist;HOB elevated     General bed mobility comments: min assist for trunk supine to sit; increased time to perform (pt did not utilize R UE to assist with bed mobility)  Transfers Overall transfer level: Needs assistance Equipment used: Rolling walker (2 wheeled) Transfers: Sit to/from Stand Sit to Stand: Min guard         General transfer comment: fairly strong stand and controlled sitting with use of RW; x3 trials from bed  Ambulation/Gait Ambulation/Gait  assistance: Min guard Ambulation Distance (Feet): 200 Feet Assistive device: Rolling walker (2 wheeled) Gait Pattern/deviations: Step-through pattern;Decreased step length - right;Decreased step length - left Gait velocity: decreased   General Gait Details: steady ambulation with RW; occasional vc's to stay away from wall and objects in hallway   Stairs            Wheelchair Mobility    Modified Rankin (Stroke Patients Only)       Balance Overall balance assessment: Needs assistance Sitting-balance support: Feet supported;No upper extremity supported Sitting balance-Leahy Scale: Good Sitting balance - Comments: steady sitting reaching within BOS   Standing balance support: Bilateral upper extremity supported Standing balance-Leahy Scale: Poor Standing balance comment: requires at least single UE support for static standing balance                            Cognition Arousal/Alertness: Awake/alert Behavior During Therapy: WFL for tasks assessed/performed Overall Cognitive Status: No family/caregiver present to determine baseline cognitive functioning(Oriented to person)                                 General Comments: Pt with diagnosis of dementia but no family present to determine baseline cognition      Exercises      General Comments General comments (skin integrity, edema, etc.): Pt resting in bed upon PT arrival.  Nursing cleared pt for participation in physical therapy.  Pt agreeable to PT session.      Pertinent Vitals/Pain Pain Assessment: Faces Faces Pain Scale: No hurt Pain Intervention(s): Limited activity within patient's tolerance;Monitored during session  HR and O2 on room air WFL during session.    Home Living                      Prior Function            PT Goals (current goals can now be found in the care plan section) Acute Rehab PT Goals Patient Stated Goal: to walk more PT Goal Formulation: With  patient Time For Goal Achievement: 02/08/17 Potential to Achieve Goals: Good Progress towards PT goals: Progressing toward goals    Frequency    Min 2X/week      PT Plan Current plan remains appropriate    Co-evaluation              AM-PAC PT "6 Clicks" Daily Activity  Outcome Measure  Difficulty turning over in bed (including adjusting bedclothes, sheets and blankets)?: A Little Difficulty moving from lying on back to sitting on the side of the bed? : Unable Difficulty sitting down on and standing up from a chair with arms (e.g., wheelchair, bedside commode, etc,.)?: Unable Help needed moving to and from a bed to chair (including a wheelchair)?: A Little Help needed walking in hospital room?: A Little Help needed climbing 3-5 steps with a railing? : A Little 6 Click Score: 14    End of Session Equipment Utilized During Treatment: Gait belt Activity Tolerance: Patient tolerated treatment well Patient left: in chair;with call bell/phone within reach;with chair alarm set Nurse Communication: Mobility status;Precautions;Other (comment)(Pt's BP) PT Visit Diagnosis: Other abnormalities of gait and mobility (R26.89);Muscle weakness (generalized) (M62.81);History of falling (Z91.81)     Time: 7106-2694 PT Time Calculation (min) (ACUTE ONLY): 38 min  Charges:  $Gait Training: 8-22 mins $Therapeutic Exercise: 8-22 mins $Therapeutic Activity: 8-22 mins                    G CodesLeitha Bleak, PT 01/26/17, 4:03 PM [phone]

## 2017-01-26 NOTE — Progress Notes (Signed)
[facility] [place] NAME: Jamie Boyd    MR#:  [id number]  DATE OF BIRTH:  [birth year]  SUBJECTIVE:   Patient is a bit more lethargic today. Blood pressure but on the low side. No sedative meds given this morning. Patient had MRI of the right shoulder which showing Large chronic full-thickness retracted tears of the infraspinatus and supraspinatus tendons with severe atrophy of those muscles. 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  Patient getting IV fluid bolus due to the hypotension.  REVIEW OF SYSTEMS:    Review of Systems  Unable to perform ROS: Mental acuity    Nutrition: Heart Healthy dysphagia III, thin Tolerating Diet: Yes Tolerating PT: Eval noted.   DRUG ALLERGIES:   Allergies  Allergen Reactions  . Pollen Extract Other (See Comments)    VITALS:  Blood pressure (!) 94/56, pulse 76, temperature 98.5 F (36.9 C), temperature source Oral, resp. rate 18, height 5\' 5"  (1.651 m), weight 63.2 kg (139 lb 4.8 oz), SpO2 100 %.  PHYSICAL EXAMINATION:   Physical Exam  GENERAL:  66 y.o.-year-old patient lying in bed a bit lethargic today.  EYES: Pupils equal, round, reactive to light and accommodation. No scleral icterus. Extraocular muscles intact.  HEENT: Head atraumatic, normocephalic. Oropharynx and nasopharynx clear.  NECK:  Supple, no jugular venous distention. No thyroid enlargement, no tenderness.  LUNGS: Normal breath sounds bilaterally, no wheezing, rales, rhonchi. No use of accessory muscles of respiration.  CARDIOVASCULAR: S1, S2 normal. No murmurs, rubs, or gallops.  ABDOMEN: Soft, nontender, nondistended. Bowel sounds present. No organomegaly or mass.  EXTREMITIES: No cyanosis, clubbing or edema b/l.    NEUROLOGIC: Cranial nerves II through XII are intact. No focal Motor or sensory deficits b/l. Globally weak PSYCHIATRIC: The patient is alert and oriented x 1 SKIN: No obvious rash, lesion, or ulcer.     LABORATORY PANEL:   CBC Recent Labs  Lab 01/25/17 0413  WBC 9.1  HGB 10.7*  HCT 31.9*  PLT 188   ------------------------------------------------------------------------------------------------------------------  Chemistries  Recent Labs  Lab 01/21/17 0500  01/25/17 0413  NA 139   < > 139  K 3.3*   < > 3.9  CL 107   < > 103  CO2 28   < > 27  GLUCOSE 87   < > 82  BUN 12   < > 12  CREATININE 0.78   < > 0.68  CALCIUM 8.1*   < > 8.6*  MG 1.6*  --   --   AST 27  --   --   ALT 20  --   --   ALKPHOS 39  --   --   BILITOT 0.5  --   --    < > = values in this interval not displayed.   ------------------------------------------------------------------------------------------------------------------  Cardiac Enzymes Recent Labs  Lab 01/20/17 1050  TROPONINI <0.03   ------------------------------------------------------------------------------------------------------------------  RADIOLOGY:  Ct Angio Head W Or Wo Contrast  Result Date: 01/24/2017 CLINICAL DATA:  Weakness, possible TIA.  History of dementia. EXAM: CT ANGIOGRAPHY HEAD AND NECK CT PERFUSION BRAIN TECHNIQUE: Multidetector CT imaging of the head and neck was performed using the standard protocol during bolus administration of intravenous contrast. Multiplanar CT image reconstructions and MIPs were obtained to evaluate the vascular anatomy. Carotid stenosis measurements (when applicable) are obtained utilizing NASCET criteria, using the distal internal carotid diameter as the denominator. Multiphase CT imaging of the brain was performed following IV bolus contrast  injection. Subsequent parametric perfusion maps were calculated using RAPID software. CONTRAST:  124mL ISOVUE-370 IOPAMIDOL (ISOVUE-370) INJECTION 76% COMPARISON:  CT HEAD January 20, 2017 and January 24, 2017 FINDINGS: CTA NECK AORTIC ARCH: Normal appearance of the thoracic arch, normal branch pattern. The origins of the innominate, left Common carotid  artery and subclavian artery are widely patent. RIGHT CAROTID SYSTEM: Common carotid artery is widely patent, coursing in a straight line fashion. Mild intimal calcific atherosclerosis carotid bifurcation without hemodynamically significant stenosis by NASCET criteria. Normal appearance of the internal carotid artery. LEFT CAROTID SYSTEM: Common carotid artery is widely patent, coursing in a straight line fashion. Moderate atherosclerosis resulting in less than 50% stenosis by NASCET criteria. Normal appearance of the internal carotid artery. VERTEBRAL ARTERIES:RIGHT vertebral artery is dominant. Patent vertebral artery's with tortuosity associated with chronic hypertension. SKELETON: No acute osseous process though bone windows have not been submitted. Multilevel moderate to severe cervical spondylosis. Multiple absent teeth. RIGHT shoulder loose body, bursal collection and severe glenohumeral osteoarthrosis. OTHER NECK: Soft tissues of the neck are nonacute though, not tailored for evaluation. UPPER CHEST: Included lung apices are clear. No superior mediastinal lymphadenopathy. CTA HEAD ANTERIOR CIRCULATION: Patent cervical internal carotid arteries, petrous, cavernous and supra clinoid internal carotid arteries. Calcific atherosclerosis resulting in moderate stenosis LEFT cavernous internal carotid artery. Mild stenosis RIGHT cavernous carotid. 2 mm RIGHT posterior communicating artery origin medially directed intact aneurysm. Patent anterior communicating artery. Patent anterior and middle cerebral arteries, mild luminal irregularity compatible with atherosclerosis. No large vessel occlusion, significant stenosis, contrast extravasation or aneurysm. POSTERIOR CIRCULATION: Patent vertebral arteries, vertebrobasilar junction and basilar artery, as well as main branch vessels. Patent posterior cerebral arteries, mild luminal regularity compatible with atherosclerosis. No large vessel occlusion, significant  stenosis, contrast extravasation or aneurysm. VENOUS SINUSES: Major dural venous sinuses are patent though not tailored for evaluation on this angiographic examination. ANATOMIC VARIANTS: Hypoplastic RIGHT A1 segment. DELAYED PHASE: No abnormal intracranial enhancement. MIP images reviewed. CT Brain Perfusion Findings: CBF (<30%) Volume: 78mL Perfusion (Tmax>6.0s) volume: 86mL Mismatch Volume: 50mL IMPRESSION: CTA NECK: 1. Atherosclerosis without hemodynamically significant stenosis or acute vascular process. CTA HEAD: 1. No emergent large vessel occlusion. 2. Atherosclerosis resulting in moderate stenosis LEFT ICA. Mild cerebral artery atherosclerosis. 3. 2 mm RIGHT PCOM origin aneurysm. CT PERFUSION: 1. No acute perfusion abnormality. Electronically Signed   By: Elon Alas M.D.   On: 01/24/2017 17:10   Dg Shoulder Right  Result Date: 01/24/2017 CLINICAL DATA:  Shoulder pain EXAM: RIGHT SHOULDER - 2+ VIEW COMPARISON:  None. FINDINGS: No fracture or malalignment. Moderate AC joint degenerative change. Moderate glenohumeral degenerative change. Effacement of the subacromial space. IMPRESSION: 1. No fracture or malalignment 2. Effacement of the subacromial space consistent with rotator cuff disease 3. Moderate AC joint degenerative change Electronically Signed   By: Donavan Foil M.D.   On: 01/24/2017 19:18   Ct Head Wo Contrast  Result Date: 01/24/2017 CLINICAL DATA:  66 year old female with TIA symptoms EXAM: CT HEAD WITHOUT CONTRAST TECHNIQUE: Contiguous axial images were obtained from the base of the skull through the vertex without intravenous contrast. COMPARISON:  Prior head CT 01/20/2017 FINDINGS: Brain: No evidence of acute infarction, hemorrhage, hydrocephalus, extra-axial collection or mass lesion/mass effect. Stable appearance of global cerebral and cerebellar cortical atrophy, ex vacuo ventriculomegaly, and confluent periventricular white matter hypoattenuation. Vascular: No hyperdense  vessel or unexpected calcification. Skull: Normal. Negative for fracture or focal lesion. Sinuses/Orbits: No acute finding. Other: None. IMPRESSION: 1. No acute intracranial abnormality.  2. Similar appearance of age advanced atrophy and chronic microvascular ischemic white matter disease. Electronically Signed   By: Jacqulynn Cadet M.D.   On: 01/24/2017 13:10   Ct Angio Neck W And/or Wo Contrast  Result Date: 01/24/2017 CLINICAL DATA:  Weakness, possible TIA.  History of dementia. EXAM: CT ANGIOGRAPHY HEAD AND NECK CT PERFUSION BRAIN TECHNIQUE: Multidetector CT imaging of the head and neck was performed using the standard protocol during bolus administration of intravenous contrast. Multiplanar CT image reconstructions and MIPs were obtained to evaluate the vascular anatomy. Carotid stenosis measurements (when applicable) are obtained utilizing NASCET criteria, using the distal internal carotid diameter as the denominator. Multiphase CT imaging of the brain was performed following IV bolus contrast injection. Subsequent parametric perfusion maps were calculated using RAPID software. CONTRAST:  160mL ISOVUE-370 IOPAMIDOL (ISOVUE-370) INJECTION 76% COMPARISON:  CT HEAD January 20, 2017 and January 24, 2017 FINDINGS: CTA NECK AORTIC ARCH: Normal appearance of the thoracic arch, normal branch pattern. The origins of the innominate, left Common carotid artery and subclavian artery are widely patent. RIGHT CAROTID SYSTEM: Common carotid artery is widely patent, coursing in a straight line fashion. Mild intimal calcific atherosclerosis carotid bifurcation without hemodynamically significant stenosis by NASCET criteria. Normal appearance of the internal carotid artery. LEFT CAROTID SYSTEM: Common carotid artery is widely patent, coursing in a straight line fashion. Moderate atherosclerosis resulting in less than 50% stenosis by NASCET criteria. Normal appearance of the internal carotid artery. VERTEBRAL  ARTERIES:RIGHT vertebral artery is dominant. Patent vertebral artery's with tortuosity associated with chronic hypertension. SKELETON: No acute osseous process though bone windows have not been submitted. Multilevel moderate to severe cervical spondylosis. Multiple absent teeth. RIGHT shoulder loose body, bursal collection and severe glenohumeral osteoarthrosis. OTHER NECK: Soft tissues of the neck are nonacute though, not tailored for evaluation. UPPER CHEST: Included lung apices are clear. No superior mediastinal lymphadenopathy. CTA HEAD ANTERIOR CIRCULATION: Patent cervical internal carotid arteries, petrous, cavernous and supra clinoid internal carotid arteries. Calcific atherosclerosis resulting in moderate stenosis LEFT cavernous internal carotid artery. Mild stenosis RIGHT cavernous carotid. 2 mm RIGHT posterior communicating artery origin medially directed intact aneurysm. Patent anterior communicating artery. Patent anterior and middle cerebral arteries, mild luminal irregularity compatible with atherosclerosis. No large vessel occlusion, significant stenosis, contrast extravasation or aneurysm. POSTERIOR CIRCULATION: Patent vertebral arteries, vertebrobasilar junction and basilar artery, as well as main branch vessels. Patent posterior cerebral arteries, mild luminal regularity compatible with atherosclerosis. No large vessel occlusion, significant stenosis, contrast extravasation or aneurysm. VENOUS SINUSES: Major dural venous sinuses are patent though not tailored for evaluation on this angiographic examination. ANATOMIC VARIANTS: Hypoplastic RIGHT A1 segment. DELAYED PHASE: No abnormal intracranial enhancement. MIP images reviewed. CT Brain Perfusion Findings: CBF (<30%) Volume: 36mL Perfusion (Tmax>6.0s) volume: 33mL Mismatch Volume: 90mL IMPRESSION: CTA NECK: 1. Atherosclerosis without hemodynamically significant stenosis or acute vascular process. CTA HEAD: 1. No emergent large vessel occlusion. 2.  Atherosclerosis resulting in moderate stenosis LEFT ICA. Mild cerebral artery atherosclerosis. 3. 2 mm RIGHT PCOM origin aneurysm. CT PERFUSION: 1. No acute perfusion abnormality. Electronically Signed   By: Elon Alas M.D.   On: 01/24/2017 17:10   Mr Brain Wo Contrast  Result Date: 01/25/2017 CLINICAL DATA:  Right-sided weakness and multiple falls EXAM: MRI HEAD WITHOUT CONTRAST TECHNIQUE: Multiplanar, multiecho pulse sequences of the brain and surrounding structures were obtained without intravenous contrast. COMPARISON:  Head CT/CTA 01/24/2017 FINDINGS: Brain: The midline structures are normal. There is no acute infarct or acute hemorrhage. No mass lesion,  hydrocephalus, dural abnormality or extra-axial collection. There is multifocal white matter hyperintensity suggesting chronic ischemic microangiopathy. Age advanced atrophy without lobar predilection. No chronic microhemorrhage or superficial siderosis. Vascular: Major intracranial arterial and venous sinus flow voids are preserved. Skull and upper cervical spine: The visualized skull base, calvarium, upper cervical spine and extracranial soft tissues are normal. Sinuses/Orbits: No fluid levels or advanced mucosal thickening. No mastoid or middle ear effusion. Normal orbits. IMPRESSION: Chronic ischemic microangiopathy and age advanced atrophy without acute intracranial abnormality. Electronically Signed   By: Ulyses Jarred M.D.   On: 01/25/2017 00:06   Mr Shoulder Right Wo Contrast  Result Date: 01/26/2017 CLINICAL DATA:  Right shoulder pain. EXAM: MRI OF THE RIGHT SHOULDER WITHOUT CONTRAST TECHNIQUE: Multiplanar, multisequence MR imaging of the shoulder was performed. No intravenous contrast was administered. COMPARISON:  Radiographs dated 01/24/2017 FINDINGS: Rotator cuff: There are large full-thickness retracted tears of the infraspinatus and supraspinatus tendons. Teres minor and subscapularis tendons are intact. Muscles: Severe atrophy  of the supraspinatus and infraspinatus muscles. Edema or extravasated joint fluid around the subscapularis and infraspinatus muscles. Biceps long head:  Properly located and intact. Acromioclavicular Joint: Mild to moderate AC joint arthropathy. Type 2 acromion. Glenohumeral Joint: Moderately severe osteoarthritis of the glenohumeral joint with denuding of the articular cartilage, marginal osteophyte formation, and subcortical edema in the inferior aspect of the glenoid. Large joint effusion. The humeral head is superiorly subluxed and articulates with the undersurface of the acromion. Labrum: Diffusely diminutive. The inferior labrum appears to be fragmented. Bones: Extensive bone infarct in the proximal left humeral shaft, approximately 8 cm in length. Other: None IMPRESSION: 1. Moderately severe osteoarthritis of the glenohumeral joint. 2. Large chronic full-thickness retracted tears of the infraspinatus and supraspinatus tendons with severe atrophy of those muscles. 3. 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 Electronically Signed   By: Lorriane Shire M.D.   On: 01/26/2017 08:57   Ct Cerebral Perfusion W Contrast  Result Date: 01/24/2017 CLINICAL DATA:  Weakness, possible TIA.  History of dementia. EXAM: CT ANGIOGRAPHY HEAD AND NECK CT PERFUSION BRAIN TECHNIQUE: Multidetector CT imaging of the head and neck was performed using the standard protocol during bolus administration of intravenous contrast. Multiplanar CT image reconstructions and MIPs were obtained to evaluate the vascular anatomy. Carotid stenosis measurements (when applicable) are obtained utilizing NASCET criteria, using the distal internal carotid diameter as the denominator. Multiphase CT imaging of the brain was performed following IV bolus contrast injection. Subsequent parametric perfusion maps were calculated using RAPID software. CONTRAST:  162mL ISOVUE-370 IOPAMIDOL (ISOVUE-370) INJECTION 76% COMPARISON:  CT HEAD January 20, 2017 and January 24, 2017 FINDINGS: CTA NECK AORTIC ARCH: Normal appearance of the thoracic arch, normal branch pattern. The origins of the innominate, left Common carotid artery and subclavian artery are widely patent. RIGHT CAROTID SYSTEM: Common carotid artery is widely patent, coursing in a straight line fashion. Mild intimal calcific atherosclerosis carotid bifurcation without hemodynamically significant stenosis by NASCET criteria. Normal appearance of the internal carotid artery. LEFT CAROTID SYSTEM: Common carotid artery is widely patent, coursing in a straight line fashion. Moderate atherosclerosis resulting in less than 50% stenosis by NASCET criteria. Normal appearance of the internal carotid artery. VERTEBRAL ARTERIES:RIGHT vertebral artery is dominant. Patent vertebral artery's with tortuosity associated with chronic hypertension. SKELETON: No acute osseous process though bone windows have not been submitted. Multilevel moderate to severe cervical spondylosis. Multiple absent teeth. RIGHT shoulder loose body, bursal collection and severe glenohumeral osteoarthrosis. OTHER NECK: Soft  tissues of the neck are nonacute though, not tailored for evaluation. UPPER CHEST: Included lung apices are clear. No superior mediastinal lymphadenopathy. CTA HEAD ANTERIOR CIRCULATION: Patent cervical internal carotid arteries, petrous, cavernous and supra clinoid internal carotid arteries. Calcific atherosclerosis resulting in moderate stenosis LEFT cavernous internal carotid artery. Mild stenosis RIGHT cavernous carotid. 2 mm RIGHT posterior communicating artery origin medially directed intact aneurysm. Patent anterior communicating artery. Patent anterior and middle cerebral arteries, mild luminal irregularity compatible with atherosclerosis. No large vessel occlusion, significant stenosis, contrast extravasation or aneurysm. POSTERIOR CIRCULATION: Patent vertebral arteries, vertebrobasilar junction and basilar  artery, as well as main branch vessels. Patent posterior cerebral arteries, mild luminal regularity compatible with atherosclerosis. No large vessel occlusion, significant stenosis, contrast extravasation or aneurysm. VENOUS SINUSES: Major dural venous sinuses are patent though not tailored for evaluation on this angiographic examination. ANATOMIC VARIANTS: Hypoplastic RIGHT A1 segment. DELAYED PHASE: No abnormal intracranial enhancement. MIP images reviewed. CT Brain Perfusion Findings: CBF (<30%) Volume: 68mL Perfusion (Tmax>6.0s) volume: 34mL Mismatch Volume: 16mL IMPRESSION: CTA NECK: 1. Atherosclerosis without hemodynamically significant stenosis or acute vascular process. CTA HEAD: 1. No emergent large vessel occlusion. 2. Atherosclerosis resulting in moderate stenosis LEFT ICA. Mild cerebral artery atherosclerosis. 3. 2 mm RIGHT PCOM origin aneurysm. CT PERFUSION: 1. No acute perfusion abnormality. Electronically Signed   By: Elon Alas M.D.   On: 01/24/2017 17:10     ASSESSMENT AND PLAN:   Baby Gieger  is a 66 y.o. female with a known history of dementia, anemia who was recently in the hospital 3 days ago for failure to thrive and weakness was brought back in secondary to falls at home and right-sided weakness that was noticed last night.  1. Right-sided weakness-admitted to rule out CVA -Patient's neurologic workup so far has been negative. Patient's MRI did not show any acute CVA. Patient CT angiogram of the neck suggestive of some mild left carotid artery stenosis which can be followed up as outpatient with vascular. Echocardiogram with bubble study showing no evidence of intracardiac thrombus or source for CVA. Seen by speech and started on a dysphagia 3 diet and tolerating it well. Appreciate physical therapy evaluation and patient will need home health services upon discharge. -Continue aspirin, statin.  2. Hypotension-etiology is unclear. Suspected to be secondary to dehydration,  poor by mouth intake. -We'll give IV fluid bolus and follow hemodynamics, hold antihypertensives. Hold any sedative meds for now. If not improving will initiate infectious workup with chest x-ray, cultures and urinalysis.  3. Right shoulder pain-as per the family patient has been having some pain in the right shoulder. X-ray of the right shoulder showed some DJD and possible rotator cuff injury. -MRI of the right shoulder done yesterday showing Large chronic full-thickness retracted tears of the infraspinatus and supraspinatus tendons with severe atrophy of those muscles. 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 -Discussed with orthopedics over the phone and they recommend outpatient follow-up as these findings are likely chronic.  4. Dementia-and depression. Seems to be pleasantly confused. - Cont. Aricept and Namenda. - cont. Depakote, Seroquel, Trazodone.   5. Chronic anemia-stable. Continue iron supplements  Possible d/c home tomorrow with Home Health if improving. Discussed plan of care with Niece Jamie Boyd over the phone.   All the records are reviewed and case discussed with Care Management/Social Worker. Management plans discussed with the patient, family and they are in agreement.  CODE STATUS: Full code  DVT Prophylaxis: Lovenox  TOTAL TIME TAKING CARE OF THIS PATIENT:  35 minutes.   POSSIBLE D/C IN 1-2 DAYS, DEPENDING ON CLINICAL CONDITION.   Henreitta Leber M.D on 01/26/2017 at 12:36 PM  Between 7am to 6pm - Pager - [phone]  After 6pm go to [email or web address] - Proofreader  [facility]  Office  [phone]  CC: Primary care physician; Sharyne Peach, MD

## 2017-01-26 NOTE — Care Management Important Message (Signed)
Important Message  Patient Details  Name: Jamie Boyd MRN: [id number] Date of Birth: [birth year]   Medicare Important Message Given:  Yes Signed IM notice given    Katrina Stack, RN 01/26/2017, 1:24 PM

## 2017-01-26 NOTE — Care Management (Signed)
Notified [facility] pof discharge and obtained order to resume the home health SN PT Aide OT SW

## 2017-01-27 LAB — BASIC METABOLIC PANEL
ANION GAP: 7 (ref 5–15)
BUN: 12 mg/dL (ref 6–20)
CALCIUM: 8.8 mg/dL — AB (ref 8.9–10.3)
CO2: 27 mmol/L (ref 22–32)
Chloride: 105 mmol/L (ref 101–111)
Creatinine, Ser: 0.75 mg/dL (ref 0.44–1.00)
GFR calc Af Amer: >60 mL/min (ref >60)
GFR calc non Af Amer: >60 mL/min (ref >60)
GLUCOSE: 91 mg/dL (ref 65–99)
POTASSIUM: 3.6 mmol/L (ref 3.5–5.1)
Sodium: 139 mmol/L (ref 135–145)

## 2017-01-27 LAB — CBC
HEMATOCRIT: 35.2 % (ref 35.0–47.0)
HEMOGLOBIN: 11.8 g/dL — AB (ref 12.0–16.0)
MCH: 32.1 pg (ref 26.0–34.0)
MCHC: 33.5 g/dL (ref 32.0–36.0)
MCV: 96 fL (ref 80.0–100.0)
Platelets: 234 10*3/uL (ref 150–440)
RBC: 3.66 MIL/uL — ABNORMAL LOW (ref 3.80–5.20)
RDW: 14.2 % (ref 11.5–14.5)
WBC: 8.8 10*3/uL (ref 3.6–11.0)

## 2017-01-27 MED ORDER — ATORVASTATIN CALCIUM 40 MG PO TABS: 40.0000 mg | ORAL_TABLET | Freq: Every day | ORAL | 1 refills | Status: DC

## 2017-01-27 MED ORDER — ASPIRIN EC 81 MG PO TBEC: 81.0000 mg | DELAYED_RELEASE_TABLET | Freq: Every day | ORAL | 1 refills | Status: DC

## 2017-01-27 NOTE — Progress Notes (Signed)
Pt D/C to home by EMS. Spoke to niece Marylynn over the phone and educated on D/C. Family was not happy that pt would be receiving home health and not be going to rehab or SNF. Put them in touch with Case Management, and Dr. Verdell Carmine to come to a decision.  VSS. Bathed. IV removed intact.

## 2017-01-27 NOTE — Care Management Note (Signed)
Case Management Note  Patient Details  Name: Jamie Boyd MRN: [id number] Date of Birth: January 21, 1951  Subjective/Objective:   This Probation officer spoke to daughter [name] at [phone] per [name]'s nurses request. [name] states that she was told that [name] had a stroke and is now told that she did not. Daughter states that she was told that [name] would be going to rehab and now is told that  [name] will be discharged to home with home health. Daughter is requesting Rehab even after this writer explained that per her [facility] Physical Therapist evaluation, she does not qualify for Rehabilitation. [name] is currently an open client of [facility]. Discussed with [name]'s nurse.                Action/Plan:   Expected Discharge Date:  01/27/17               Expected Discharge Plan:  [place]  In-House Referral:     Discharge planning Services  CM Consult  Post Acute Care Choice:  Resumption of Svcs/PTA Provider Choice offered to:     DME Arranged:    DME Agency:     HH Arranged:  RN, PT, OT, Nurse's Aide, Social Work, Theme park manager Therapy HH Agency:  [facility]  Status of Service:  Completed, signed off  If discussed at [employer], dates discussed:    Additional Comments:  Becker Christopher A, RN 01/27/2017, 12:48 PM

## 2017-01-27 NOTE — Discharge Summary (Signed)
[facility] [place] NAME: Jamie Boyd    MR#:  [id number]  DATE OF BIRTH:  [birth year]  DATE OF ADMISSION:  01/24/2017 ADMITTING PHYSICIAN: Gladstone Lighter, MD  DATE OF DISCHARGE: 01/27/2017  PRIMARY CARE PHYSICIAN: Sharyne Peach, MD    ADMISSION DIAGNOSIS:  Pain [R52] Acute ischemic stroke ([place]) [I63.9]  DISCHARGE DIAGNOSIS:  Active Problems:   CVA (cerebral vascular accident) ([place])   SECONDARY DIAGNOSIS:   Past Medical History:  Diagnosis Date  . Anemia   . Dementia   . Goiter   . Memory deficit     HOSPITAL COURSE:   [name], anemia who was recently in the hospital 3 days ago for failure to thrive and weakness was brought back in secondary to falls at home and right-sided weakness that was noticed last night.  1.Right-sided weakness-admitted to rule out CVA -Patient's neurologic workup while in the hospital has been negative. Patient's MRI did not show any acute CVA. Patient CT angiogram of the neck suggestive of some mild left carotid artery stenosis which can be followed up as outpatient with vascular. Echocardiogram with bubble study showing no evidence of intracardiac thrombus or source for CVA. Seen by speech and started on a dysphagia 3 diet and tolerating it well. Appreciate physical therapy evaluation and patient will need home health services upon discharge. -Patient is being discharged on aspirin, statin. With home health physical therapy and nursing services.  2. Hypotension-Suspected to be secondary to dehydration, poor by mouth intake. -This has improved with IV fluid hydration has not resolved. No evidence of infectious source. Patient is afebrile.  3. Right shoulder pain-as per the family patient has been having some pain in the right shoulder. X-ray of the right shoulder showed some DJD and possible rotator cuff injury. -MRI of the right shoulder  done yesterday showing Large chronic full-thickness retracted tears of the infraspinatus and supraspinatus tendons with severe atrophy of those muscles. 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 -Discussed with orthopedics over the phone and they recommend outpatient follow-up as these findings are likely chronic.  4. Dementia-and depression. Seems to be pleasantly confused. - Cont. Aricept and Namenda. - cont. Depakote, Seroquel, Trazodone.   5. Chronic anemia-stable. Continue iron supplements  Patient is being discharged home with home health nursing, physical therapy, social work, home health aide and occupational therapy.  DISCHARGE CONDITIONS:   Stable  CONSULTS OBTAINED:  Treatment Team:  Alexis Goodell, MD  DRUG ALLERGIES:   Allergies  Allergen Reactions  . Pollen Extract Other (See Comments)    DISCHARGE MEDICATIONS:   Allergies as of 01/27/2017      Reactions   Pollen Extract Other (See Comments)      Medication List    TAKE these medications   aspirin EC 81 MG tablet Take 1 tablet (81 mg total) by mouth daily.   atorvastatin 40 MG tablet Commonly known as:  LIPITOR Take 1 tablet (40 mg total) by mouth daily at 6 PM.   CENTRUM SILVER ULTRA WOMENS PO Take 1 tablet by mouth daily.   divalproex 250 MG DR tablet Commonly known as:  DEPAKOTE Take 250 mg by mouth 3 (three) times daily.   donepezil 10 MG tablet Commonly known as:  ARICEPT Take 10 mg by mouth at bedtime.   estradiol 0.1 MG/GM vaginal cream Commonly known as:  ESTRACE VAGINAL Apply 0.5mg  (pea-sized amount)  just inside the vaginal introitus with  a finger-tip every night for two weeks and then Monday, Wednesday and Friday nights.   ferrous sulfate 325 (65 FE) MG EC tablet Take 325 mg by mouth 3 (three) times daily with meals.   fexofenadine 180 MG tablet Commonly known as:  ALLEGRA Take 180 mg by mouth daily.   memantine 10 MG tablet Commonly known as:  NAMENDA Take  10 mg by mouth 2 (two) times daily.   MYRBETRIQ 25 MG Tb24 tablet Generic drug:  mirabegron ER Take 25 mg by mouth daily.   QUEtiapine 25 MG tablet Commonly known as:  SEROQUEL Take 125 mg by mouth at bedtime   traZODone 100 MG tablet Commonly known as:  DESYREL Take 100 mg nightly at bedtime         DISCHARGE INSTRUCTIONS:   DIET:  Cardiac diet  Dysphagia III with thin liquids  DISCHARGE CONDITION:  Stable  ACTIVITY:  Activity as tolerated  OXYGEN:  Home Oxygen: No.   Oxygen Delivery: room air  DISCHARGE LOCATION:  Home with home health physical therapy, occupational therapy, home health aide, social work, nursing   If you experience worsening of your admission symptoms, develop shortness of breath, life threatening emergency, suicidal or homicidal thoughts you must seek medical attention immediately by calling 911 or calling your MD immediately  if symptoms less severe.  You Must read complete instructions/literature along with all the possible adverse reactions/side effects for all the Medicines you take and that have been prescribed to you. Take any new Medicines after you have completely understood and accpet all the possible adverse reactions/side effects.   Please note  You were cared for by a hospitalist during your hospital stay. If you have any questions about your discharge medications or the care you received while you were in the hospital after you are discharged, you can call the unit and asked to speak with the hospitalist on call if the hospitalist that took care of you is not available. Once you are discharged, your primary care physician will handle any further medical issues. Please note that NO REFILLS for any discharge medications will be authorized once you are discharged, as it is imperative that you return to your primary care physician (or establish a relationship with a primary care physician if you do not have one) for your aftercare needs so  that they can reassess your need for medications and monitor your lab values.     Today   No acute events overnight. Blood pressure improved. Patient is awake and tolerating by mouth well.  VITAL SIGNS:  Blood pressure (!) 110/59, pulse 73, temperature 98.5 F (36.9 C), temperature source Oral, resp. rate 18, height 5\' 5"  (1.651 m), weight 63.2 kg (139 lb 4.8 oz), SpO2 92 %.  I/O:    Intake/Output Summary (Last 24 hours) at 01/27/2017 1149 Last data filed at 01/26/2017 1700 Gross per 24 hour  Intake 240 ml  Output -  Net 240 ml    PHYSICAL EXAMINATION:   GENERAL:  66 y.o.-year-old patient lying in bed awake and following commands.    EYES: Pupils equal, round, reactive to light and accommodation. No scleral icterus. Extraocular muscles intact.  HEENT: Head atraumatic, normocephalic. Oropharynx and nasopharynx clear.  NECK:  Supple, no jugular venous distention. No thyroid enlargement, no tenderness.  LUNGS: Normal breath sounds bilaterally, no wheezing, rales, rhonchi. No use of accessory muscles of respiration.  CARDIOVASCULAR: S1, S2 normal. No murmurs, rubs, or gallops.  ABDOMEN: Soft, nontender, nondistended. Bowel sounds present.  No organomegaly or mass.  EXTREMITIES: No cyanosis, clubbing or edema b/l.    NEUROLOGIC: Cranial nerves II through XII are intact. No focal Motor or sensory deficits b/l. Globally weak PSYCHIATRIC: The patient is alert and oriented x 1 SKIN: No obvious rash, lesion, or ulcer.    DATA REVIEW:   CBC Recent Labs  Lab 01/27/17 0518  WBC 8.8  HGB 11.8*  HCT 35.2  PLT 234    Chemistries  Recent Labs  Lab 01/21/17 0500  01/27/17 0518  NA 139   < > 139  K 3.3*   < > 3.6  CL 107   < > 105  CO2 28   < > 27  GLUCOSE 87   < > 91  BUN 12   < > 12  CREATININE 0.78   < > 0.75  CALCIUM 8.1*   < > 8.8*  MG 1.6*  --   --   AST 27  --   --   ALT 20  --   --   ALKPHOS 39  --   --   BILITOT 0.5  --   --    < > = values in this interval  not displayed.    Cardiac Enzymes No results for input(s): TROPONINI in the last 168 hours.  Microbiology Results  Results for orders placed or performed during the hospital encounter of 01/24/17  Urine culture     Status: Abnormal   Collection Time: 01/24/17  3:06 PM  Result Value Ref Range Status   Specimen Description URINE, RANDOM  Final   Special Requests NONE  Final   Culture (A)  Final    60,000 COLONIES/mL DIPHTHEROIDS(CORYNEBACTERIUM SPECIES) Standardized susceptibility testing for this organism is not available. Performed at [facility], [place] [address], [place], [place] [address]    Report Status 01/25/2017 FINAL  Final    RADIOLOGY:  Mr Shoulder Right Wo Contrast  Result Date: 01/26/2017 CLINICAL DATA:  Right shoulder pain. EXAM: MRI OF THE RIGHT SHOULDER WITHOUT CONTRAST TECHNIQUE: Multiplanar, multisequence MR imaging of the shoulder was performed. No intravenous contrast was administered. COMPARISON:  Radiographs dated 01/24/2017 FINDINGS: Rotator cuff: There are large full-thickness retracted tears of the infraspinatus and supraspinatus tendons. Teres minor and subscapularis tendons are intact. Muscles: Severe atrophy of the supraspinatus and infraspinatus muscles. Edema or extravasated joint fluid around the subscapularis and infraspinatus muscles. Biceps long head:  Properly located and intact. Acromioclavicular Joint: Mild to moderate AC joint arthropathy. Type 2 acromion. Glenohumeral Joint: Moderately severe osteoarthritis of the glenohumeral joint with denuding of the articular cartilage, marginal osteophyte formation, and subcortical edema in the inferior aspect of the glenoid. Large joint effusion. The humeral head is superiorly subluxed and articulates with the undersurface of the acromion. Labrum: Diffusely diminutive. The inferior labrum appears to be fragmented. Bones: Extensive bone infarct in the proximal left humeral shaft, approximately 8 cm in length.  Other: None IMPRESSION: 1. Moderately severe osteoarthritis of the glenohumeral joint. 2. Large chronic full-thickness retracted tears of the infraspinatus and supraspinatus tendons with severe atrophy of those muscles. 3. 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 Electronically Signed   By: Lorriane Shire M.D.   On: 01/26/2017 08:57      Management plans discussed with the patient, family and they are in agreement.  CODE STATUS:     Code Status Orders  (From admission, onward)        Start     Ordered  01/24/17 2020  Full code  Continuous     01/24/17 2019  Advance Directive Documentation     Most Recent Value  Type of Advance Directive  Healthcare Power of Attorney  Pre-existing out of facility DNR order (yellow form or pink MOST form)  No data  "MOST" Form in Place?  No data      TOTAL TIME TAKING CARE OF THIS PATIENT: 40 minutes.    Henreitta Leber M.D on 01/27/2017 at 11:49 AM  Between 7am to 6pm - Pager - [phone]  After 6pm go to [email or web address] - Proofreader  [facility]  Office  [phone]  CC: Primary care physician; Sharyne Peach, MD

## 2017-01-27 NOTE — Care Management Note (Addendum)
Case Management Note  Patient Details  Name: Jamie Boyd MRN: [id number] Date of Birth: May 11, 1950  Subjective/Objective:    Call to [name] at [facility] to resume HH=PT, RN, OT, Aide, SW, speech.                Action/Plan:   Expected Discharge Date:  01/27/17               Expected Discharge Plan:  [name]  In-House Referral:     Discharge planning Services  CM Consult  Post Acute Care Choice:  Resumption of Svcs/PTA Provider Choice offered to:     DME Arranged:    DME Agency:     HH Arranged:  RN, PT, OT, Nurse's Aide, Social Work, Theme park manager Therapy HH Agency:  [facility]  Status of Service:  Completed, signed off  If discussed at [employer], dates discussed:    Additional Comments:  Aslan Himes A, RN 01/27/2017, 11:44 AM

## 2017-02-06 DIAGNOSIS — F0390 Unspecified dementia without behavioral disturbance: Secondary | ICD-10-CM | POA: Diagnosis not present

## 2017-02-07 ENCOUNTER — Encounter: Payer: Self-pay | Admitting: Urology

## 2017-02-07 ENCOUNTER — Ambulatory Visit (INDEPENDENT_AMBULATORY_CARE_PROVIDER_SITE_OTHER): Payer: Medicare HMO | Admitting: Urology

## 2017-02-07 VITALS — BP 107/73 | HR 92 | Ht 65.0 in | Wt 133.2 lb

## 2017-02-07 DIAGNOSIS — N3946 Mixed incontinence: Secondary | ICD-10-CM | POA: Diagnosis not present

## 2017-02-07 DIAGNOSIS — N952 Postmenopausal atrophic vaginitis: Secondary | ICD-10-CM

## 2017-02-07 DIAGNOSIS — Z87440 Personal history of urinary (tract) infections: Secondary | ICD-10-CM

## 2017-02-07 DIAGNOSIS — F0390 Unspecified dementia without behavioral disturbance: Secondary | ICD-10-CM | POA: Diagnosis not present

## 2017-02-07 LAB — URINALYSIS, COMPLETE
Bilirubin, UA: NEGATIVE
GLUCOSE, UA: NEGATIVE
Ketones, UA: NEGATIVE
Nitrite, UA: NEGATIVE
PROTEIN UA: NEGATIVE
RBC UA: NEGATIVE
SPEC GRAV UA: 1.02 (ref 1.005–1.030)
UUROB: 1 mg/dL (ref 0.2–1.0)
pH, UA: 6 (ref 5.0–7.5)

## 2017-02-07 LAB — MICROSCOPIC EXAMINATION: RBC, UA: NONE SEEN /hpf (ref 0–?)

## 2017-02-07 NOTE — Progress Notes (Signed)
In and Out Catheterization  Patient is present today for a I & O catheterization due to urinary frequency. Patient was cleaned and prepped in a sterile fashion with betadine and Lidocaine 2% jelly was instilled into the urethra.  A 14FR cath was inserted no complications were noted , 56ml of urine return was noted, urine was yellow in color. A clean urine sample was collected for UA. Bladder was drained  And catheter was removed with out difficulty.    Preformed by: Elberta Leatherwood, CMA

## 2017-02-07 NOTE — Progress Notes (Signed)
02/07/2017 8:36 PM   Jamie Boyd Apr 02, 1950 [id number]  Referring provider: Sharyne Peach, MD [place] [place], [place] [address]  Chief Complaint  Patient presents with  . Recurrent UTI    HPI: 89 AAF with vaginal atrophy and incontinence who presents today for a one month follow up after a trial of Myrbetriq 25 mg.    Background history Patient is a 67 -year-old [place] American female who is referred to [place] by, Dr. Enzo Bi, for recurrent urinary tract infections with her sister in law, Lattie Haw.  Patient states that she has had three urinary tract infections over the last year.  Reviewing her records,  she has had several cultures positive for multiple species.  Her symptoms with a urinary tract infection consist of dysuria and mid back pain.  She endorses dysuria, gross hematuria, suprapubic pain and  back pain.  She has not had any recent fevers, chills, nausea or vomiting.  She does have a history of nephrolithiasis, she has not had GU surgery or GU trauma.  She is not sexually active.  She not postmenopausal.   She denies constipation and/or diarrhea.   She does not engage in good perineal hygiene. She does not take tub baths.  She has incontinence.  She is having SUI and UI.  She is using incontinence pads x 5.   CATH UA was negative.  PVR is 20 mL.   She is not having pain with bladder filling.   CT Renal stone study performed in 11/2015 noted a left lower pole non obstructing 11 mm stone.   She is drinking 2 bottles of water daily.  She is not taking cranberry tablets, probiotics or Vitamin C.    Today, she is experiencing urgency x 8 or more, frequency x 8 or more, is restricting fluids to avoid visits to the restroom, not engaging in toilet mapping, incontinence x 8 or more (worse) and nocturia x 8 or more.  Her PVR is 80 mL.  Her BP is 107/73.  Her major complaints today are frequency, dysuria, incontinence, intermittency and hesitancy.  She is not having gross hematuria  or suprapubic pain.  She is not having fevers, chills, nausea or vomiting.    She is using her vaginal estrogen cream three nights weekly.    Her CATH UA today is positive for 11-30 WBC's and moderate bacteria.      PMH: Past Medical History:  Diagnosis Date  . Anemia   . Dementia   . Goiter   . Memory deficit     Surgical History: Past Surgical History:  Procedure Laterality Date  . COLONOSCOPY WITH PROPOFOL N/A 02/18/2015   Procedure: COLONOSCOPY WITH PROPOFOL;  Surgeon: Hulen Luster, MD;  Location[facility] ENDOSCOPY;  Service: Gastroenterology;  Laterality: N/A;  . ESOPHAGOGASTRODUODENOSCOPY N/A 02/18/2015   Procedure: ESOPHAGOGASTRODUODENOSCOPY (EGD);  Surgeon: Hulen Luster, MD;  Location: [facility] ENDOSCOPY;  Service: Gastroenterology;  Laterality: N/A;    Home Medications:  Allergies as of 02/07/2017      Reactions   Pollen Extract Other (See Comments)      Medication List        Accurate as of 02/07/17 11:59 PM. Always use your most recent med list.          aspirin EC 81 MG tablet Take 1 tablet (81 mg total) by mouth daily.   atorvastatin 40 MG tablet Commonly known as:  LIPITOR Take 1 tablet (40 mg total) by mouth daily at 6 PM.  CENTRUM SILVER ULTRA WOMENS PO Take 1 tablet by mouth daily.   divalproex 250 MG DR tablet Commonly known as:  DEPAKOTE Take 250 mg by mouth 3 (three) times daily.   donepezil 10 MG tablet Commonly known as:  ARICEPT Take 10 mg by mouth at bedtime.   estradiol 0.1 MG/GM vaginal cream Commonly known as:  ESTRACE VAGINAL Apply 0.33m (pea-sized amount)  just inside the vaginal introitus with a finger-tip every night for two weeks and then Monday, Wednesday and Friday nights.   ferrous sulfate 325 (65 FE) MG EC tablet Take 325 mg by mouth 3 (three) times daily with meals.   fexofenadine 180 MG tablet Commonly known as:  ALLEGRA Take 180 mg by mouth daily.   memantine 10 MG tablet Commonly known as:  NAMENDA Take 10 mg by mouth 2 (two)  times daily.   MYRBETRIQ 25 MG Tb24 tablet Generic drug:  mirabegron ER Take 25 mg by mouth daily.   QUEtiapine 25 MG tablet Commonly known as:  SEROQUEL Take 125 mg by mouth at bedtime   traZODone 100 MG tablet Commonly known as:  DESYREL Take 100 mg nightly at bedtime       Allergies:  Allergies  Allergen Reactions  . Pollen Extract Other (See Comments)    Family History: Family History  Problem Relation Age of Onset  . Diabetes Maternal Uncle   . Prostate cancer Neg Hx   . Bladder Cancer Neg Hx   . Kidney cancer Neg Hx     Social History:  reports that she quit smoking about 3 years ago. Her smoking use included cigarettes. she has never used smokeless tobacco. She reports that she does not drink alcohol or use drugs.  ROS: UROLOGY Frequent Urination?: Yes Hard to postpone urination?: No Burning/pain with urination?: Yes Get up at night to urinate?: No Leakage of urine?: Yes Urine stream starts and stops?: No Trouble starting stream?: Yes Do you have to strain to urinate?: Yes Blood in urine?: No Urinary tract infection?: Yes Sexually transmitted disease?: No Injury to kidneys or bladder?: No Painful intercourse?: No Weak stream?: No Currently pregnant?: No Vaginal bleeding?: No Last menstrual period?: n  Gastrointestinal Nausea?: No Vomiting?: No Indigestion/heartburn?: No Diarrhea?: No Constipation?: No  Constitutional Fever: No Night sweats?: No Weight loss?: No Fatigue?: No  Skin Skin rash/lesions?: No Itching?: No  Eyes Blurred vision?: No Double vision?: No  Ears/Nose/Throat Sore throat?: No Sinus problems?: No  Hematologic/Lymphatic Swollen glands?: No Easy bruising?: No  Cardiovascular Leg swelling?: Yes Chest pain?: No  Respiratory Cough?: No Shortness of breath?: No  Endocrine Excessive thirst?: No  Musculoskeletal Back pain?: Yes Joint pain?: No  Neurological Headaches?: No Dizziness?:  No  Psychologic Depression?: No Anxiety?: No  Physical Exam: BP 107/73 (BP Location: Right Arm, Patient Position: Sitting, Cuff Size: Normal)   Pulse 92   Ht _0  (1.651 m)   Wt 133 lb 3.2 oz (60.4 kg)   BMI 22.17 kg/m   Constitutional: Well nourished. Alert and oriented, No acute distress. HEENT: [place] AT, moist mucus membranes. Trachea midline, no masses. Cardiovascular: No clubbing, cyanosis, or edema. Respiratory: Normal respiratory effort, no increased work of breathing. GI: Abdomen is soft, non tender, non distended, no abdominal masses. Liver and spleen not palpable.  No hernias appreciated.  Stool sample for occult testing is not indicated.   GU: No CVA tenderness.  No bladder fullness or masses.   Skin: No rashes, bruises or suspicious lesions. Lymph: No cervical  or inguinal adenopathy. Neurologic: Grossly intact, no focal deficits, moving all 4 extremities. Psychiatric: Normal mood and affect.  Laboratory Data: Lab Results  Component Value Date   WBC 8.8 01/27/2017   HGB 11.8 (L) 01/27/2017   HCT 35.2 01/27/2017   MCV 96.0 01/27/2017   PLT 234 01/27/2017    Lab Results  Component Value Date   CREATININE 0.75 01/27/2017    Lab Results  Component Value Date   AST 27 01/21/2017   Lab Results  Component Value Date   ALT 20 01/21/2017    Urinalysis 11-30 WBC.   Moderate bacteria.  See Epic.   I have reviewed the labs.   Assessment & Plan:    1. Recurrent UTI's  - criteria for recurrent UTI has now been met with 2 or more infections in 6 months or 3 or greater infections in one year   - Patient is again instructed to increase their water intake until the urine is pale yellow or clear (10 to 12 cups daily)   - Patient is again instructed to take probiotics (yogurt, oral pills or vaginal suppositories), take cranberry pills or drink the juice and Vitamin C 1,000 mg daily to acidify the urine should be added to their daily regimen   - she is avoiding  soaking in tubs   - encouraged to wipe front to back after urinating   - CATH UA was suspicious for infection - will send for culture - will wait on culture results before prescribing an antibiotic  2. Vaginal atrophy  - continue the estrogen vaginal cream three nights weekly  - She will follow up in three months for an exam.    3. Incontinence  - offered behavioral therapies, bladder training, bladder control strategies and pelvic floor muscle training - patient with dementia  - fluid management - encouraged to drink 1.5 L of water daily  - found Myrbetriq 25 mg somewhat effective - would like to try an increase in dose  - RTC in 3 weeks for PVR and symptom recheck                                            Return in about 3 weeks (around 02/28/2017) for PVR and OAB questionnaire.  These notes generated with voice recognition software. I apologize for typographical errors.  Zara Council, [facility] [address] [place], [place] [address] [phone]

## 2017-02-08 DIAGNOSIS — L03115 Cellulitis of right lower limb: Secondary | ICD-10-CM | POA: Diagnosis not present

## 2017-02-08 DIAGNOSIS — F0390 Unspecified dementia without behavioral disturbance: Secondary | ICD-10-CM | POA: Diagnosis not present

## 2017-02-09 DIAGNOSIS — M549 Dorsalgia, unspecified: Secondary | ICD-10-CM | POA: Diagnosis not present

## 2017-02-09 DIAGNOSIS — Z7982 Long term (current) use of aspirin: Secondary | ICD-10-CM | POA: Diagnosis not present

## 2017-02-09 DIAGNOSIS — M6281 Muscle weakness (generalized): Secondary | ICD-10-CM | POA: Diagnosis not present

## 2017-02-09 DIAGNOSIS — D126 Benign neoplasm of colon, unspecified: Secondary | ICD-10-CM | POA: Diagnosis not present

## 2017-02-09 DIAGNOSIS — D509 Iron deficiency anemia, unspecified: Secondary | ICD-10-CM | POA: Diagnosis not present

## 2017-02-09 DIAGNOSIS — G8929 Other chronic pain: Secondary | ICD-10-CM | POA: Diagnosis not present

## 2017-02-09 DIAGNOSIS — K219 Gastro-esophageal reflux disease without esophagitis: Secondary | ICD-10-CM | POA: Diagnosis not present

## 2017-02-09 DIAGNOSIS — F0390 Unspecified dementia without behavioral disturbance: Secondary | ICD-10-CM | POA: Diagnosis not present

## 2017-02-09 DIAGNOSIS — G309 Alzheimer's disease, unspecified: Secondary | ICD-10-CM | POA: Diagnosis not present

## 2017-02-09 DIAGNOSIS — F0280 Dementia in other diseases classified elsewhere without behavioral disturbance: Secondary | ICD-10-CM | POA: Diagnosis not present

## 2017-02-10 DIAGNOSIS — F0390 Unspecified dementia without behavioral disturbance: Secondary | ICD-10-CM | POA: Diagnosis not present

## 2017-02-10 LAB — CULTURE, URINE COMPREHENSIVE

## 2017-02-12 ENCOUNTER — Telehealth: Payer: Self-pay

## 2017-02-12 DIAGNOSIS — Z111 Encounter for screening for respiratory tuberculosis: Secondary | ICD-10-CM | POA: Diagnosis not present

## 2017-02-12 DIAGNOSIS — F0390 Unspecified dementia without behavioral disturbance: Secondary | ICD-10-CM | POA: Diagnosis not present

## 2017-02-12 MED ORDER — AMOXICILLIN-POT CLAVULANATE 875-125 MG PO TABS: 1.0000 | ORAL_TABLET | Freq: Two times a day (BID) | ORAL | 0 refills | Status: AC

## 2017-02-12 NOTE — Telephone Encounter (Signed)
LMOM-medication sent to pharmacy 

## 2017-02-12 NOTE — Telephone Encounter (Signed)
-----   Message from Nori Riis, PA-C sent at 02/11/2017  5:33 PM EST ----- Please et Mrs. Hath's caregivers know that she has a positive urine culture and needs to start Augmentin 875/125, bid x 7 days.

## 2017-02-13 DIAGNOSIS — F0390 Unspecified dementia without behavioral disturbance: Secondary | ICD-10-CM | POA: Diagnosis not present

## 2017-02-13 DIAGNOSIS — L97411 Non-pressure chronic ulcer of right heel and midfoot limited to breakdown of skin: Secondary | ICD-10-CM | POA: Diagnosis not present

## 2017-02-13 DIAGNOSIS — M79671 Pain in right foot: Secondary | ICD-10-CM | POA: Diagnosis not present

## 2017-02-13 DIAGNOSIS — R600 Localized edema: Secondary | ICD-10-CM | POA: Diagnosis not present

## 2017-02-13 NOTE — Telephone Encounter (Signed)
Spoke with pt caregiver in reference to pt needing abx. Caregiver stated she will pick up medication today.

## 2017-02-14 DIAGNOSIS — F0390 Unspecified dementia without behavioral disturbance: Secondary | ICD-10-CM | POA: Diagnosis not present

## 2017-02-14 NOTE — Care Management (Addendum)
Informed by CM assistant that Jamie Boyd has called regarding Jamie Boyd and "is irate" because no one filled out an FL2 while patient was here mid December to give to the family. CM called [facility] who is currently providing SN PT and SW services and informed that SW attempted to schedule visit today and caregiver stated it was not a good day and finally settled on a home visit on 1/11.  Patient also has medicaid pcs and family was attempting to have hours increased.  Spoke with Jamie Boyd- who is patient's niece.  She stated she was told in the ED that family would be provided with an FL2. There is documentation by social worker covering the ED12/15.  It is documented that an FL2 would be completed to fax out for  placement.  SNF was not recommended therefore and FL2 would not have been needed.  There does not appear to be any documentation that a long range plan of facility placement  was discussed. "You better do it. I am not taking her back to the doctor."   When attempted to discuss the attempts for home health social worker scheduling of a visit today, stated "You are lying- you are all liars." Attempted several times to restate that the home health social worker could assist with FL2 and Jamie Jamie Boyd replied "Oh no they aren't- you are because that is what you said."  Jamie Boyd threatened to call a Chief Executive Officer.  CM reinforced that an FL2 could be obtained with the help of the home health social worker and patient's PCP.  [facility] will follow up with patient and family. When Jamie Boyd became more verbally abusive CM informed her two consecutive times that call was going to be terminated

## 2017-02-15 DIAGNOSIS — M19011 Primary osteoarthritis, right shoulder: Secondary | ICD-10-CM | POA: Diagnosis not present

## 2017-02-15 DIAGNOSIS — M75101 Unspecified rotator cuff tear or rupture of right shoulder, not specified as traumatic: Secondary | ICD-10-CM | POA: Diagnosis not present

## 2017-02-15 DIAGNOSIS — G8929 Other chronic pain: Secondary | ICD-10-CM | POA: Diagnosis not present

## 2017-02-15 DIAGNOSIS — M549 Dorsalgia, unspecified: Secondary | ICD-10-CM | POA: Diagnosis not present

## 2017-02-15 DIAGNOSIS — F0280 Dementia in other diseases classified elsewhere without behavioral disturbance: Secondary | ICD-10-CM | POA: Diagnosis not present

## 2017-02-15 DIAGNOSIS — F0390 Unspecified dementia without behavioral disturbance: Secondary | ICD-10-CM | POA: Diagnosis not present

## 2017-02-15 DIAGNOSIS — K219 Gastro-esophageal reflux disease without esophagitis: Secondary | ICD-10-CM | POA: Diagnosis not present

## 2017-02-15 DIAGNOSIS — Z7982 Long term (current) use of aspirin: Secondary | ICD-10-CM | POA: Diagnosis not present

## 2017-02-15 DIAGNOSIS — M12811 Other specific arthropathies, not elsewhere classified, right shoulder: Secondary | ICD-10-CM | POA: Diagnosis not present

## 2017-02-15 DIAGNOSIS — M6281 Muscle weakness (generalized): Secondary | ICD-10-CM | POA: Diagnosis not present

## 2017-02-15 DIAGNOSIS — D126 Benign neoplasm of colon, unspecified: Secondary | ICD-10-CM | POA: Diagnosis not present

## 2017-02-15 DIAGNOSIS — D509 Iron deficiency anemia, unspecified: Secondary | ICD-10-CM | POA: Diagnosis not present

## 2017-02-15 DIAGNOSIS — G309 Alzheimer's disease, unspecified: Secondary | ICD-10-CM | POA: Diagnosis not present

## 2017-02-16 DIAGNOSIS — K219 Gastro-esophageal reflux disease without esophagitis: Secondary | ICD-10-CM | POA: Diagnosis not present

## 2017-02-16 DIAGNOSIS — M6281 Muscle weakness (generalized): Secondary | ICD-10-CM | POA: Diagnosis not present

## 2017-02-16 DIAGNOSIS — D509 Iron deficiency anemia, unspecified: Secondary | ICD-10-CM | POA: Diagnosis not present

## 2017-02-16 DIAGNOSIS — D126 Benign neoplasm of colon, unspecified: Secondary | ICD-10-CM | POA: Diagnosis not present

## 2017-02-16 DIAGNOSIS — M549 Dorsalgia, unspecified: Secondary | ICD-10-CM | POA: Diagnosis not present

## 2017-02-16 DIAGNOSIS — Z7982 Long term (current) use of aspirin: Secondary | ICD-10-CM | POA: Diagnosis not present

## 2017-02-16 DIAGNOSIS — F0280 Dementia in other diseases classified elsewhere without behavioral disturbance: Secondary | ICD-10-CM | POA: Diagnosis not present

## 2017-02-16 DIAGNOSIS — F0390 Unspecified dementia without behavioral disturbance: Secondary | ICD-10-CM | POA: Diagnosis not present

## 2017-02-16 DIAGNOSIS — G8929 Other chronic pain: Secondary | ICD-10-CM | POA: Diagnosis not present

## 2017-02-16 DIAGNOSIS — N3941 Urge incontinence: Secondary | ICD-10-CM | POA: Diagnosis not present

## 2017-02-16 DIAGNOSIS — G309 Alzheimer's disease, unspecified: Secondary | ICD-10-CM | POA: Diagnosis not present

## 2017-02-17 DIAGNOSIS — F0390 Unspecified dementia without behavioral disturbance: Secondary | ICD-10-CM | POA: Diagnosis not present

## 2017-02-19 DIAGNOSIS — Z7982 Long term (current) use of aspirin: Secondary | ICD-10-CM | POA: Diagnosis not present

## 2017-02-19 DIAGNOSIS — G309 Alzheimer's disease, unspecified: Secondary | ICD-10-CM | POA: Diagnosis not present

## 2017-02-19 DIAGNOSIS — G8929 Other chronic pain: Secondary | ICD-10-CM | POA: Diagnosis not present

## 2017-02-19 DIAGNOSIS — M549 Dorsalgia, unspecified: Secondary | ICD-10-CM | POA: Diagnosis not present

## 2017-02-19 DIAGNOSIS — D509 Iron deficiency anemia, unspecified: Secondary | ICD-10-CM | POA: Diagnosis not present

## 2017-02-19 DIAGNOSIS — D126 Benign neoplasm of colon, unspecified: Secondary | ICD-10-CM | POA: Diagnosis not present

## 2017-02-19 DIAGNOSIS — K219 Gastro-esophageal reflux disease without esophagitis: Secondary | ICD-10-CM | POA: Diagnosis not present

## 2017-02-19 DIAGNOSIS — M6281 Muscle weakness (generalized): Secondary | ICD-10-CM | POA: Diagnosis not present

## 2017-02-19 DIAGNOSIS — F0280 Dementia in other diseases classified elsewhere without behavioral disturbance: Secondary | ICD-10-CM | POA: Diagnosis not present

## 2017-02-19 DIAGNOSIS — F0390 Unspecified dementia without behavioral disturbance: Secondary | ICD-10-CM | POA: Diagnosis not present

## 2017-02-20 DIAGNOSIS — G8929 Other chronic pain: Secondary | ICD-10-CM | POA: Diagnosis not present

## 2017-02-20 DIAGNOSIS — M6281 Muscle weakness (generalized): Secondary | ICD-10-CM | POA: Diagnosis not present

## 2017-02-20 DIAGNOSIS — M549 Dorsalgia, unspecified: Secondary | ICD-10-CM | POA: Diagnosis not present

## 2017-02-20 DIAGNOSIS — D509 Iron deficiency anemia, unspecified: Secondary | ICD-10-CM | POA: Diagnosis not present

## 2017-02-20 DIAGNOSIS — G309 Alzheimer's disease, unspecified: Secondary | ICD-10-CM | POA: Diagnosis not present

## 2017-02-20 DIAGNOSIS — F0280 Dementia in other diseases classified elsewhere without behavioral disturbance: Secondary | ICD-10-CM | POA: Diagnosis not present

## 2017-02-20 DIAGNOSIS — K219 Gastro-esophageal reflux disease without esophagitis: Secondary | ICD-10-CM | POA: Diagnosis not present

## 2017-02-20 DIAGNOSIS — F0390 Unspecified dementia without behavioral disturbance: Secondary | ICD-10-CM | POA: Diagnosis not present

## 2017-02-20 DIAGNOSIS — D126 Benign neoplasm of colon, unspecified: Secondary | ICD-10-CM | POA: Diagnosis not present

## 2017-02-20 DIAGNOSIS — Z7982 Long term (current) use of aspirin: Secondary | ICD-10-CM | POA: Diagnosis not present

## 2017-02-21 DIAGNOSIS — F0390 Unspecified dementia without behavioral disturbance: Secondary | ICD-10-CM | POA: Diagnosis not present

## 2017-02-22 DIAGNOSIS — G309 Alzheimer's disease, unspecified: Secondary | ICD-10-CM | POA: Diagnosis not present

## 2017-02-22 DIAGNOSIS — D126 Benign neoplasm of colon, unspecified: Secondary | ICD-10-CM | POA: Diagnosis not present

## 2017-02-22 DIAGNOSIS — F0280 Dementia in other diseases classified elsewhere without behavioral disturbance: Secondary | ICD-10-CM | POA: Diagnosis not present

## 2017-02-22 DIAGNOSIS — F0390 Unspecified dementia without behavioral disturbance: Secondary | ICD-10-CM | POA: Diagnosis not present

## 2017-02-22 DIAGNOSIS — G8929 Other chronic pain: Secondary | ICD-10-CM | POA: Diagnosis not present

## 2017-02-22 DIAGNOSIS — K219 Gastro-esophageal reflux disease without esophagitis: Secondary | ICD-10-CM | POA: Diagnosis not present

## 2017-02-22 DIAGNOSIS — D509 Iron deficiency anemia, unspecified: Secondary | ICD-10-CM | POA: Diagnosis not present

## 2017-02-22 DIAGNOSIS — M549 Dorsalgia, unspecified: Secondary | ICD-10-CM | POA: Diagnosis not present

## 2017-02-22 DIAGNOSIS — M6281 Muscle weakness (generalized): Secondary | ICD-10-CM | POA: Diagnosis not present

## 2017-02-22 DIAGNOSIS — Z7982 Long term (current) use of aspirin: Secondary | ICD-10-CM | POA: Diagnosis not present

## 2017-02-23 DIAGNOSIS — F0390 Unspecified dementia without behavioral disturbance: Secondary | ICD-10-CM | POA: Diagnosis not present

## 2017-02-24 DIAGNOSIS — F0390 Unspecified dementia without behavioral disturbance: Secondary | ICD-10-CM | POA: Diagnosis not present

## 2017-02-26 ENCOUNTER — Other Ambulatory Visit: Payer: Self-pay

## 2017-02-26 ENCOUNTER — Emergency Department: Payer: Medicare HMO

## 2017-02-26 ENCOUNTER — Emergency Department
Admission: EM | Admit: 2017-02-26 | Discharge: 2017-02-27 | Disposition: A | Discharge status: Home / Self Care | Payer: Medicare HMO | Attending: Emergency Medicine | Admitting: Emergency Medicine

## 2017-02-26 DIAGNOSIS — R0602 Shortness of breath: Secondary | ICD-10-CM | POA: Diagnosis not present

## 2017-02-26 DIAGNOSIS — K219 Gastro-esophageal reflux disease without esophagitis: Secondary | ICD-10-CM | POA: Diagnosis not present

## 2017-02-26 DIAGNOSIS — E162 Hypoglycemia, unspecified: Secondary | ICD-10-CM | POA: Diagnosis not present

## 2017-02-26 DIAGNOSIS — R2241 Localized swelling, mass and lump, right lower limb: Secondary | ICD-10-CM | POA: Insufficient documentation

## 2017-02-26 DIAGNOSIS — G309 Alzheimer's disease, unspecified: Secondary | ICD-10-CM | POA: Diagnosis not present

## 2017-02-26 DIAGNOSIS — Z79899 Other long term (current) drug therapy: Secondary | ICD-10-CM | POA: Insufficient documentation

## 2017-02-26 DIAGNOSIS — M7989 Other specified soft tissue disorders: Secondary | ICD-10-CM | POA: Diagnosis not present

## 2017-02-26 DIAGNOSIS — F0391 Unspecified dementia with behavioral disturbance: Secondary | ICD-10-CM | POA: Insufficient documentation

## 2017-02-26 DIAGNOSIS — D126 Benign neoplasm of colon, unspecified: Secondary | ICD-10-CM | POA: Diagnosis not present

## 2017-02-26 DIAGNOSIS — M6281 Muscle weakness (generalized): Secondary | ICD-10-CM | POA: Diagnosis not present

## 2017-02-26 DIAGNOSIS — M549 Dorsalgia, unspecified: Secondary | ICD-10-CM | POA: Diagnosis not present

## 2017-02-26 DIAGNOSIS — F0280 Dementia in other diseases classified elsewhere without behavioral disturbance: Secondary | ICD-10-CM | POA: Diagnosis not present

## 2017-02-26 DIAGNOSIS — I6789 Other cerebrovascular disease: Secondary | ICD-10-CM | POA: Diagnosis not present

## 2017-02-26 DIAGNOSIS — Z87891 Personal history of nicotine dependence: Secondary | ICD-10-CM | POA: Insufficient documentation

## 2017-02-26 DIAGNOSIS — F0390 Unspecified dementia without behavioral disturbance: Secondary | ICD-10-CM | POA: Diagnosis not present

## 2017-02-26 DIAGNOSIS — R109 Unspecified abdominal pain: Secondary | ICD-10-CM | POA: Diagnosis not present

## 2017-02-26 DIAGNOSIS — Z7982 Long term (current) use of aspirin: Secondary | ICD-10-CM | POA: Diagnosis not present

## 2017-02-26 DIAGNOSIS — R739 Hyperglycemia, unspecified: Secondary | ICD-10-CM | POA: Diagnosis not present

## 2017-02-26 DIAGNOSIS — D509 Iron deficiency anemia, unspecified: Secondary | ICD-10-CM | POA: Diagnosis not present

## 2017-02-26 DIAGNOSIS — G8929 Other chronic pain: Secondary | ICD-10-CM | POA: Diagnosis not present

## 2017-02-26 LAB — COMPREHENSIVE METABOLIC PANEL
ALBUMIN: 3.3 g/dL — AB (ref 3.5–5.0)
ALT: 33 U/L (ref 14–54)
ANION GAP: 13 (ref 5–15)
AST: 53 U/L — AB (ref 15–41)
Alkaline Phosphatase: 55 U/L (ref 38–126)
BILIRUBIN TOTAL: 0.8 mg/dL (ref 0.3–1.2)
BUN: 12 mg/dL (ref 6–20)
CHLORIDE: 104 mmol/L (ref 101–111)
CO2: 20 mmol/L — ABNORMAL LOW (ref 22–32)
Calcium: 8.7 mg/dL — ABNORMAL LOW (ref 8.9–10.3)
Creatinine, Ser: 0.82 mg/dL (ref 0.44–1.00)
GFR calc Af Amer: >60 mL/min (ref >60)
GFR calc non Af Amer: >60 mL/min (ref >60)
Glucose, Bld: 141 mg/dL — ABNORMAL HIGH (ref 65–99)
POTASSIUM: 4.9 mmol/L (ref 3.5–5.1)
Sodium: 137 mmol/L (ref 135–145)
TOTAL PROTEIN: 7 g/dL (ref 6.5–8.1)

## 2017-02-26 LAB — CBC WITH DIFFERENTIAL/PLATELET
BASOS ABS: 0.1 10*3/uL (ref 0–0.1)
BASOS PCT: 1 %
EOS ABS: 0.1 10*3/uL (ref 0–0.7)
EOS PCT: 1 %
HCT: 34.7 % — ABNORMAL LOW (ref 35.0–47.0)
Hemoglobin: 11.5 g/dL — ABNORMAL LOW (ref 12.0–16.0)
Lymphocytes Relative: 20 %
Lymphs Abs: 2.1 10*3/uL (ref 1.0–3.6)
MCH: 31 pg (ref 26.0–34.0)
MCHC: 33.2 g/dL (ref 32.0–36.0)
MCV: 93.3 fL (ref 80.0–100.0)
Monocytes Absolute: 1.2 10*3/uL — ABNORMAL HIGH (ref 0.2–0.9)
Monocytes Relative: 11 %
Neutro Abs: 7.2 10*3/uL — ABNORMAL HIGH (ref 1.4–6.5)
Neutrophils Relative %: 67 %
PLATELETS: 227 10*3/uL (ref 150–440)
RBC: 3.72 MIL/uL — ABNORMAL LOW (ref 3.80–5.20)
RDW: 15.8 % — AB (ref 11.5–14.5)
WBC: 10.7 10*3/uL (ref 3.6–11.0)

## 2017-02-26 LAB — GLUCOSE, CAPILLARY
GLUCOSE-CAPILLARY: 65 mg/dL (ref 65–99)
GLUCOSE-CAPILLARY: 110 mg/dL — AB (ref 65–99)
Glucose-Capillary: 58 mg/dL — ABNORMAL LOW (ref 65–99)
Glucose-Capillary: 68 mg/dL (ref 65–99)

## 2017-02-26 LAB — LIPASE, BLOOD: LIPASE: 43 U/L (ref 11–51)

## 2017-02-26 MED ORDER — DEXTROSE 50 % IV SOLN
INTRAVENOUS | Status: AC
  Administered 2017-02-26: 22:00:00
  Filled 2017-02-26: qty 50

## 2017-02-26 NOTE — ED Triage Notes (Signed)
Per ems they were called out for bgl 37 - oral glucose given. BGL 98.

## 2017-02-26 NOTE — ED Provider Notes (Signed)
[facility] Emergency Department Provider Note  ____________________________________________   I have reviewed the triage vital signs and the nursing notes.   HISTORY  Chief Complaint Hypoglycemia   History limited by and level 5 caveat due to: Dementia   HPI Jamie Boyd is a 67 y.o. female who presents to the emergency department today because of concern for low blood sugar. Companion states that she was first noticed to have low blood sugar when PT came to work with the patient. When EMS arrived the blood sugar was in the 30s. The patient does not have any history of diabetes although companion states she was diagnosed with prediabetes. The patient did eat her normal amount today. The patient has also had right leg swelling and left sided pain. This has been going on for about a month. No recent fevers. Has been treated for recurrent UTIs.   Per medical record review patient has a history of dementia  Past Medical History:  Diagnosis Date  . Anemia   . Dementia   . Goiter   . Memory deficit     Patient Active Problem List   Diagnosis Date Noted  . CVA (cerebral vascular accident) ([place]) 01/24/2017  . Dehydration 01/20/2017  . Failure to thrive in adult 01/20/2017  . Hypotension 01/20/2017  . Agitation 11/23/2015  . Moderate dementia, with behavioral disturbance 11/23/2015  . Gastroesophageal reflux disease without esophagitis 09/11/2015  . Iron deficiency anemia due to chronic blood loss 06/02/2015  . GERD without esophagitis 06/01/2015  . Difficulty sleeping 04/27/2015  . Moderate dementia without behavioral disturbance 03/01/2015  . Acute UTI 01/29/2015  . Anemia 01/29/2015  . Memory deficit 01/29/2015    Past Surgical History:  Procedure Laterality Date  . COLONOSCOPY WITH PROPOFOL N/A 02/18/2015   Procedure: COLONOSCOPY WITH PROPOFOL;  Surgeon: Hulen Luster, MD;  Location: [facility] ENDOSCOPY;  Service: Gastroenterology;  Laterality: N/A;  .  ESOPHAGOGASTRODUODENOSCOPY N/A 02/18/2015   Procedure: ESOPHAGOGASTRODUODENOSCOPY (EGD);  Surgeon: Hulen Luster, MD;  Location: [facility] ENDOSCOPY;  Service: Gastroenterology;  Laterality: N/A;    Prior to Admission medications   Medication Sig Start Date End Date Taking? Authorizing Provider  aspirin EC 81 MG tablet Take 1 tablet (81 mg total) by mouth daily. 01/27/17   Henreitta Leber, MD  atorvastatin (LIPITOR) 40 MG tablet Take 1 tablet (40 mg total) by mouth daily at 6 PM. 01/27/17   Sainani, Belia Heman, MD  divalproex (DEPAKOTE) 250 MG DR tablet Take 250 mg by mouth 3 (three) times daily.  11/23/15   [provider]  donepezil (ARICEPT) 10 MG tablet Take 10 mg by mouth at bedtime.     [provider]  estradiol (ESTRACE VAGINAL) 0.1 MG/GM vaginal cream Apply 0.5mg  (pea-sized amount)  just inside the vaginal introitus with a finger-tip every night for two weeks and then Monday, Wednesday and Friday nights. 01/08/17   Zara Council A, PA-C  ferrous sulfate 325 (65 FE) MG EC tablet Take 325 mg by mouth 3 (three) times daily with meals.    [provider]  fexofenadine (ALLEGRA) 180 MG tablet Take 180 mg by mouth daily.  09/13/15 11/27/17  [provider]  memantine (NAMENDA) 10 MG tablet Take 10 mg by mouth 2 (two) times daily.  11/23/15 11/27/17  [provider]  mirabegron ER (MYRBETRIQ) 25 MG TB24 tablet Take 25 mg by mouth daily.     [provider]  Multiple Vitamins-Minerals (CENTRUM SILVER ULTRA WOMENS PO) Take 1 tablet  by mouth daily.     [provider]  QUEtiapine (SEROQUEL) 25 MG tablet Take 125 mg by mouth at bedtime 11/23/15   [provider]  traZODone (DESYREL) 100 MG tablet Take 100 mg nightly at bedtime 04/11/16   [provider]    Allergies Pollen extract  Family History  Problem Relation Age of Onset  . Diabetes Maternal Uncle   . Prostate cancer Neg Hx   . Bladder Cancer Neg Hx   . Kidney cancer  Neg Hx     Social History Social History   Tobacco Use  . Smoking status: Former Smoker    Types: Cigarettes    Last attempt to quit: 01/25/2014    Years since quitting: 3.0  . Smokeless tobacco: Never Used  Substance Use Topics  . Alcohol use: No  . Drug use: No    Review of Systems Constitutional: No fever/chills Eyes: No visual changes. ENT: No sore throat. Cardiovascular: Denies chest pain. Respiratory: Denies shortness of breath. Gastrointestinal: Left sided abdominal pain. Genitourinary: Negative for dysuria. Musculoskeletal: Positive for right leg swelling.  Skin: Negative for rash. Neurological: Negative for headaches, focal weakness or numbness.  ____________________________________________   PHYSICAL EXAM:  VITAL SIGNS: ED Triage Vitals  Enc Vitals Group     BP 02/26/17 1909 (!) 186/68     Pulse Rate 02/26/17 1909 70     Resp 02/26/17 1909 18     Temp 02/26/17 1909 98.2 F (36.8 C)     Temp src --      SpO2 02/26/17 1909 100 %     Weight 02/26/17 1910 133 lb (60.3 kg)   Constitutional: Awake and alert. Not completely oriented. In no distress. Eyes: Conjunctivae are normal.  ENT   Head: Normocephalic and atraumatic.   Nose: No congestion/rhinnorhea.   Mouth/Throat: Mucous membranes are moist.   Neck: No stridor. Hematological/Lymphatic/Immunilogical: No cervical lymphadenopathy. Cardiovascular: Normal rate, regular rhythm.  No murmurs, rubs, or gallops.  Respiratory: Normal respiratory effort without tachypnea nor retractions. Breath sounds are clear and equal bilaterally. No wheezes/rales/rhonchi. Gastrointestinal: Soft and non tender. No rebound. No guarding.  Genitourinary: Deferred Musculoskeletal: Normal range of motion in all extremities. Trace right lower extremity edema.  Neurologic:  Normal speech and language. No gross focal neurologic deficits are appreciated.  Skin:  Skin is warm, dry and intact. No rash noted. Psychiatric:  Mood and affect are normal. Speech and behavior are normal. Patient exhibits appropriate insight and judgment.  ____________________________________________    LABS (pertinent positives/negatives)  Lipase 43 CBC wbc 10.7, hgb 11.5, plt 227 CMP k 4.9, glu 141 UA wnl  ____________________________________________   EKG  None  ____________________________________________    RADIOLOGY  [place] lower right leg No dvt  ____________________________________________   PROCEDURES  Procedures  ____________________________________________   INITIAL IMPRESSION / ASSESSMENT AND PLAN / ED COURSE  Pertinent labs & imaging results that were available during my care of the patient were reviewed by me and considered in my medical decision making (see chart for details).  Patient presented to the emergency department today with primary concern for low blood sugars.  Unclear etiology of the low blood sugar.  Patient does not have history of diabetes.  No obvious source of infection however will check urine.  Patient will be observed in the emergency department for a number of hours and glucose will be trended.  Patient in no acute distress on exam.   ____________________________________________   FINAL CLINICAL IMPRESSION(S) / ED DIAGNOSES  Final diagnoses:  Hypoglycemia     Note: This dictation was prepared with Dragon dictation. Any transcriptional errors that result from this process are unintentional     Nance Pear, MD 02/27/17 1727

## 2017-02-26 NOTE — ED Notes (Signed)
Pt to US via stretcher

## 2017-02-26 NOTE — ED Notes (Signed)
Pt given peanut butter, graham crackers, and juice.

## 2017-02-27 ENCOUNTER — Encounter: Payer: Self-pay | Admitting: Emergency Medicine

## 2017-02-27 DIAGNOSIS — F0390 Unspecified dementia without behavioral disturbance: Secondary | ICD-10-CM | POA: Diagnosis not present

## 2017-02-27 DIAGNOSIS — E162 Hypoglycemia, unspecified: Secondary | ICD-10-CM | POA: Diagnosis not present

## 2017-02-27 LAB — URINALYSIS, COMPLETE (UACMP) WITH MICROSCOPIC
BACTERIA UA: NONE SEEN
BILIRUBIN URINE: NEGATIVE
GLUCOSE, UA: 150 mg/dL — AB
Hgb urine dipstick: NEGATIVE
KETONES UR: NEGATIVE mg/dL
Leukocytes, UA: NEGATIVE
NITRITE: NEGATIVE
PH: 6 (ref 5.0–8.0)
Protein, ur: NEGATIVE mg/dL
Specific Gravity, Urine: 1.012 (ref 1.005–1.030)
Squamous Epithelial / LPF: NONE SEEN

## 2017-02-27 LAB — GLUCOSE, CAPILLARY
GLUCOSE-CAPILLARY: 152 mg/dL — AB (ref 65–99)
GLUCOSE-CAPILLARY: 78 mg/dL (ref 65–99)
Glucose-Capillary: 73 mg/dL (ref 65–99)

## 2017-02-27 NOTE — Discharge Instructions (Signed)
These follow-up with your primary care physician.

## 2017-02-27 NOTE — ED Provider Notes (Signed)
-----------------------------------------   2:56 AM on 02/27/2017 -----------------------------------------   Blood pressure 108/66, pulse 80, temperature 98.2 F (36.8 C), resp. rate 16, weight 60.3 kg (133 lb), SpO2 100 %.  Assuming care from Dr. Archie Balboa.  In short, Jamie Boyd is a 67 y.o. female with a chief complaint of Hypoglycemia .  Refer to the original H&P for additional details.  The current plan of care is to evaluate the patient's urine and reassess her hypoglycemia.  The patient's urinalysis is unremarkable.  We did check her glucose and it was 78.  We had the nurse give the patient a [place] sandwich tray and her blood sugars were 73.  We checked again an hour later and it was 152.  The patient feels well at this time.  She will be discharged home.        Loney Hering, MD 02/27/17 [phone]

## 2017-02-28 DIAGNOSIS — M549 Dorsalgia, unspecified: Secondary | ICD-10-CM | POA: Diagnosis not present

## 2017-02-28 DIAGNOSIS — D126 Benign neoplasm of colon, unspecified: Secondary | ICD-10-CM | POA: Diagnosis not present

## 2017-02-28 DIAGNOSIS — N3945 Continuous leakage: Secondary | ICD-10-CM | POA: Diagnosis not present

## 2017-02-28 DIAGNOSIS — M6281 Muscle weakness (generalized): Secondary | ICD-10-CM | POA: Diagnosis not present

## 2017-02-28 DIAGNOSIS — K219 Gastro-esophageal reflux disease without esophagitis: Secondary | ICD-10-CM | POA: Diagnosis not present

## 2017-02-28 DIAGNOSIS — G309 Alzheimer's disease, unspecified: Secondary | ICD-10-CM | POA: Diagnosis not present

## 2017-02-28 DIAGNOSIS — G8929 Other chronic pain: Secondary | ICD-10-CM | POA: Diagnosis not present

## 2017-02-28 DIAGNOSIS — F0280 Dementia in other diseases classified elsewhere without behavioral disturbance: Secondary | ICD-10-CM | POA: Diagnosis not present

## 2017-02-28 DIAGNOSIS — R899 Unspecified abnormal finding in specimens from other organs, systems and tissues: Secondary | ICD-10-CM | POA: Diagnosis not present

## 2017-02-28 DIAGNOSIS — E162 Hypoglycemia, unspecified: Secondary | ICD-10-CM | POA: Diagnosis not present

## 2017-02-28 DIAGNOSIS — Z7982 Long term (current) use of aspirin: Secondary | ICD-10-CM | POA: Diagnosis not present

## 2017-02-28 DIAGNOSIS — D509 Iron deficiency anemia, unspecified: Secondary | ICD-10-CM | POA: Diagnosis not present

## 2017-02-28 DIAGNOSIS — F0390 Unspecified dementia without behavioral disturbance: Secondary | ICD-10-CM | POA: Diagnosis not present

## 2017-03-01 DIAGNOSIS — F0280 Dementia in other diseases classified elsewhere without behavioral disturbance: Secondary | ICD-10-CM | POA: Diagnosis not present

## 2017-03-01 DIAGNOSIS — F0390 Unspecified dementia without behavioral disturbance: Secondary | ICD-10-CM | POA: Diagnosis not present

## 2017-03-01 DIAGNOSIS — D509 Iron deficiency anemia, unspecified: Secondary | ICD-10-CM | POA: Diagnosis not present

## 2017-03-01 DIAGNOSIS — Z7982 Long term (current) use of aspirin: Secondary | ICD-10-CM | POA: Diagnosis not present

## 2017-03-01 DIAGNOSIS — G309 Alzheimer's disease, unspecified: Secondary | ICD-10-CM | POA: Diagnosis not present

## 2017-03-01 DIAGNOSIS — G8929 Other chronic pain: Secondary | ICD-10-CM | POA: Diagnosis not present

## 2017-03-01 DIAGNOSIS — M549 Dorsalgia, unspecified: Secondary | ICD-10-CM | POA: Diagnosis not present

## 2017-03-01 DIAGNOSIS — D126 Benign neoplasm of colon, unspecified: Secondary | ICD-10-CM | POA: Diagnosis not present

## 2017-03-01 DIAGNOSIS — M6281 Muscle weakness (generalized): Secondary | ICD-10-CM | POA: Diagnosis not present

## 2017-03-01 DIAGNOSIS — K219 Gastro-esophageal reflux disease without esophagitis: Secondary | ICD-10-CM | POA: Diagnosis not present

## 2017-03-02 DIAGNOSIS — F0280 Dementia in other diseases classified elsewhere without behavioral disturbance: Secondary | ICD-10-CM | POA: Diagnosis not present

## 2017-03-02 DIAGNOSIS — M549 Dorsalgia, unspecified: Secondary | ICD-10-CM | POA: Diagnosis not present

## 2017-03-02 DIAGNOSIS — Z7982 Long term (current) use of aspirin: Secondary | ICD-10-CM | POA: Diagnosis not present

## 2017-03-02 DIAGNOSIS — F0390 Unspecified dementia without behavioral disturbance: Secondary | ICD-10-CM | POA: Diagnosis not present

## 2017-03-02 DIAGNOSIS — K219 Gastro-esophageal reflux disease without esophagitis: Secondary | ICD-10-CM | POA: Diagnosis not present

## 2017-03-02 DIAGNOSIS — M19011 Primary osteoarthritis, right shoulder: Secondary | ICD-10-CM | POA: Diagnosis not present

## 2017-03-02 DIAGNOSIS — D126 Benign neoplasm of colon, unspecified: Secondary | ICD-10-CM | POA: Diagnosis not present

## 2017-03-02 DIAGNOSIS — E162 Hypoglycemia, unspecified: Secondary | ICD-10-CM | POA: Diagnosis not present

## 2017-03-02 DIAGNOSIS — G8929 Other chronic pain: Secondary | ICD-10-CM | POA: Diagnosis not present

## 2017-03-02 DIAGNOSIS — N3945 Continuous leakage: Secondary | ICD-10-CM | POA: Diagnosis not present

## 2017-03-02 DIAGNOSIS — M6281 Muscle weakness (generalized): Secondary | ICD-10-CM | POA: Diagnosis not present

## 2017-03-02 DIAGNOSIS — D509 Iron deficiency anemia, unspecified: Secondary | ICD-10-CM | POA: Diagnosis not present

## 2017-03-02 DIAGNOSIS — M75101 Unspecified rotator cuff tear or rupture of right shoulder, not specified as traumatic: Secondary | ICD-10-CM | POA: Diagnosis not present

## 2017-03-02 DIAGNOSIS — G309 Alzheimer's disease, unspecified: Secondary | ICD-10-CM | POA: Diagnosis not present

## 2017-03-03 DIAGNOSIS — F0390 Unspecified dementia without behavioral disturbance: Secondary | ICD-10-CM | POA: Diagnosis not present

## 2017-03-05 DIAGNOSIS — F0390 Unspecified dementia without behavioral disturbance: Secondary | ICD-10-CM | POA: Diagnosis not present

## 2017-03-05 DIAGNOSIS — F0280 Dementia in other diseases classified elsewhere without behavioral disturbance: Secondary | ICD-10-CM | POA: Diagnosis not present

## 2017-03-05 DIAGNOSIS — D509 Iron deficiency anemia, unspecified: Secondary | ICD-10-CM | POA: Diagnosis not present

## 2017-03-05 DIAGNOSIS — M6281 Muscle weakness (generalized): Secondary | ICD-10-CM | POA: Diagnosis not present

## 2017-03-05 DIAGNOSIS — G309 Alzheimer's disease, unspecified: Secondary | ICD-10-CM | POA: Diagnosis not present

## 2017-03-05 DIAGNOSIS — M549 Dorsalgia, unspecified: Secondary | ICD-10-CM | POA: Diagnosis not present

## 2017-03-05 DIAGNOSIS — K219 Gastro-esophageal reflux disease without esophagitis: Secondary | ICD-10-CM | POA: Diagnosis not present

## 2017-03-05 DIAGNOSIS — Z7982 Long term (current) use of aspirin: Secondary | ICD-10-CM | POA: Diagnosis not present

## 2017-03-05 DIAGNOSIS — D126 Benign neoplasm of colon, unspecified: Secondary | ICD-10-CM | POA: Diagnosis not present

## 2017-03-05 DIAGNOSIS — G8929 Other chronic pain: Secondary | ICD-10-CM | POA: Diagnosis not present

## 2017-03-06 DIAGNOSIS — F0390 Unspecified dementia without behavioral disturbance: Secondary | ICD-10-CM | POA: Diagnosis not present

## 2017-03-07 DIAGNOSIS — M6281 Muscle weakness (generalized): Secondary | ICD-10-CM | POA: Diagnosis not present

## 2017-03-07 DIAGNOSIS — D509 Iron deficiency anemia, unspecified: Secondary | ICD-10-CM | POA: Diagnosis not present

## 2017-03-07 DIAGNOSIS — K219 Gastro-esophageal reflux disease without esophagitis: Secondary | ICD-10-CM | POA: Diagnosis not present

## 2017-03-07 DIAGNOSIS — G8929 Other chronic pain: Secondary | ICD-10-CM | POA: Diagnosis not present

## 2017-03-07 DIAGNOSIS — F0280 Dementia in other diseases classified elsewhere without behavioral disturbance: Secondary | ICD-10-CM | POA: Diagnosis not present

## 2017-03-07 DIAGNOSIS — D126 Benign neoplasm of colon, unspecified: Secondary | ICD-10-CM | POA: Diagnosis not present

## 2017-03-07 DIAGNOSIS — D649 Anemia, unspecified: Secondary | ICD-10-CM | POA: Diagnosis not present

## 2017-03-07 DIAGNOSIS — G309 Alzheimer's disease, unspecified: Secondary | ICD-10-CM | POA: Diagnosis not present

## 2017-03-07 DIAGNOSIS — F0390 Unspecified dementia without behavioral disturbance: Secondary | ICD-10-CM | POA: Diagnosis not present

## 2017-03-07 DIAGNOSIS — Z7982 Long term (current) use of aspirin: Secondary | ICD-10-CM | POA: Diagnosis not present

## 2017-03-07 DIAGNOSIS — R413 Other amnesia: Secondary | ICD-10-CM | POA: Diagnosis not present

## 2017-03-07 DIAGNOSIS — M549 Dorsalgia, unspecified: Secondary | ICD-10-CM | POA: Diagnosis not present

## 2017-03-07 DIAGNOSIS — D72820 Lymphocytosis (symptomatic): Secondary | ICD-10-CM | POA: Diagnosis not present

## 2017-03-08 DIAGNOSIS — F0390 Unspecified dementia without behavioral disturbance: Secondary | ICD-10-CM | POA: Diagnosis not present

## 2017-03-10 DIAGNOSIS — D509 Iron deficiency anemia, unspecified: Secondary | ICD-10-CM | POA: Diagnosis not present

## 2017-03-10 DIAGNOSIS — M549 Dorsalgia, unspecified: Secondary | ICD-10-CM | POA: Diagnosis not present

## 2017-03-10 DIAGNOSIS — Z7982 Long term (current) use of aspirin: Secondary | ICD-10-CM | POA: Diagnosis not present

## 2017-03-10 DIAGNOSIS — G309 Alzheimer's disease, unspecified: Secondary | ICD-10-CM | POA: Diagnosis not present

## 2017-03-10 DIAGNOSIS — D126 Benign neoplasm of colon, unspecified: Secondary | ICD-10-CM | POA: Diagnosis not present

## 2017-03-10 DIAGNOSIS — F0280 Dementia in other diseases classified elsewhere without behavioral disturbance: Secondary | ICD-10-CM | POA: Diagnosis not present

## 2017-03-10 DIAGNOSIS — G8929 Other chronic pain: Secondary | ICD-10-CM | POA: Diagnosis not present

## 2017-03-10 DIAGNOSIS — K219 Gastro-esophageal reflux disease without esophagitis: Secondary | ICD-10-CM | POA: Diagnosis not present

## 2017-03-10 DIAGNOSIS — M6281 Muscle weakness (generalized): Secondary | ICD-10-CM | POA: Diagnosis not present

## 2017-03-12 DIAGNOSIS — M549 Dorsalgia, unspecified: Secondary | ICD-10-CM | POA: Diagnosis not present

## 2017-03-12 DIAGNOSIS — F0280 Dementia in other diseases classified elsewhere without behavioral disturbance: Secondary | ICD-10-CM | POA: Diagnosis not present

## 2017-03-12 DIAGNOSIS — G8929 Other chronic pain: Secondary | ICD-10-CM | POA: Diagnosis not present

## 2017-03-12 DIAGNOSIS — K219 Gastro-esophageal reflux disease without esophagitis: Secondary | ICD-10-CM | POA: Diagnosis not present

## 2017-03-12 DIAGNOSIS — D126 Benign neoplasm of colon, unspecified: Secondary | ICD-10-CM | POA: Diagnosis not present

## 2017-03-12 DIAGNOSIS — M6281 Muscle weakness (generalized): Secondary | ICD-10-CM | POA: Diagnosis not present

## 2017-03-12 DIAGNOSIS — Z7982 Long term (current) use of aspirin: Secondary | ICD-10-CM | POA: Diagnosis not present

## 2017-03-12 DIAGNOSIS — G309 Alzheimer's disease, unspecified: Secondary | ICD-10-CM | POA: Diagnosis not present

## 2017-03-12 DIAGNOSIS — D509 Iron deficiency anemia, unspecified: Secondary | ICD-10-CM | POA: Diagnosis not present

## 2017-03-13 DIAGNOSIS — R1084 Generalized abdominal pain: Secondary | ICD-10-CM | POA: Diagnosis not present

## 2017-03-14 DIAGNOSIS — D126 Benign neoplasm of colon, unspecified: Secondary | ICD-10-CM | POA: Diagnosis not present

## 2017-03-14 DIAGNOSIS — G8929 Other chronic pain: Secondary | ICD-10-CM | POA: Diagnosis not present

## 2017-03-14 DIAGNOSIS — K219 Gastro-esophageal reflux disease without esophagitis: Secondary | ICD-10-CM | POA: Diagnosis not present

## 2017-03-14 DIAGNOSIS — M6281 Muscle weakness (generalized): Secondary | ICD-10-CM | POA: Diagnosis not present

## 2017-03-14 DIAGNOSIS — F0280 Dementia in other diseases classified elsewhere without behavioral disturbance: Secondary | ICD-10-CM | POA: Diagnosis not present

## 2017-03-14 DIAGNOSIS — M549 Dorsalgia, unspecified: Secondary | ICD-10-CM | POA: Diagnosis not present

## 2017-03-14 DIAGNOSIS — Z7982 Long term (current) use of aspirin: Secondary | ICD-10-CM | POA: Diagnosis not present

## 2017-03-14 DIAGNOSIS — G309 Alzheimer's disease, unspecified: Secondary | ICD-10-CM | POA: Diagnosis not present

## 2017-03-14 DIAGNOSIS — D509 Iron deficiency anemia, unspecified: Secondary | ICD-10-CM | POA: Diagnosis not present

## 2017-03-17 DIAGNOSIS — F0280 Dementia in other diseases classified elsewhere without behavioral disturbance: Secondary | ICD-10-CM | POA: Diagnosis not present

## 2017-03-17 DIAGNOSIS — M6281 Muscle weakness (generalized): Secondary | ICD-10-CM | POA: Diagnosis not present

## 2017-03-17 DIAGNOSIS — G309 Alzheimer's disease, unspecified: Secondary | ICD-10-CM | POA: Diagnosis not present

## 2017-03-17 DIAGNOSIS — M549 Dorsalgia, unspecified: Secondary | ICD-10-CM | POA: Diagnosis not present

## 2017-03-17 DIAGNOSIS — K219 Gastro-esophageal reflux disease without esophagitis: Secondary | ICD-10-CM | POA: Diagnosis not present

## 2017-03-17 DIAGNOSIS — Z7982 Long term (current) use of aspirin: Secondary | ICD-10-CM | POA: Diagnosis not present

## 2017-03-17 DIAGNOSIS — D509 Iron deficiency anemia, unspecified: Secondary | ICD-10-CM | POA: Diagnosis not present

## 2017-03-17 DIAGNOSIS — G8929 Other chronic pain: Secondary | ICD-10-CM | POA: Diagnosis not present

## 2017-03-17 DIAGNOSIS — D126 Benign neoplasm of colon, unspecified: Secondary | ICD-10-CM | POA: Diagnosis not present

## 2017-03-18 NOTE — Progress Notes (Signed)
03/19/2017 9:42 AM   Jamie Boyd February 07, 1950 [id number]  Referring provider: Sharyne Peach, MD Jamie Boyd Jamie Boyd, Jamie Boyd [id number]  Chief Complaint  Patient presents with  . Urinary Tract Infection  . Follow-up    HPI: 55 AAF with vaginal atrophy and incontinence who presents today for a three week follow up after a trial of Myrbetriq 50 mg with her son, Jamie Boyd.    Background history Patient is a 67 -year-old [place] American female who is referred to [place] by, Dr. Enzo Boyd, for recurrent urinary tract infections with her sister in law, Jamie Boyd.  Patient states that she has had three urinary tract infections over the last year.  Reviewing her records,  she has had several cultures positive for multiple species.  Her symptoms with a urinary tract infection consist of dysuria and mid back pain.  She endorses dysuria, gross hematuria, suprapubic pain and  back pain.  She has not had any recent fevers, chills, nausea or vomiting.  She does have a history of nephrolithiasis, she has not had GU surgery or GU trauma.  She is not sexually active.  She not postmenopausal.   She denies constipation and/or diarrhea.   She does not engage in good perineal hygiene. She does not take tub baths.  She has incontinence.  She is having SUI and UI.  She is using incontinence pads x 5.   CATH UA was negative.  PVR is 20 mL.   She is not having pain with bladder filling.   CT Renal stone study performed in 11/2015 noted a left lower pole non obstructing 11 mm stone.   She is drinking 2 bottles of water daily.  She is not taking cranberry tablets, probiotics or Vitamin C.    Patient is a poor historian and he son could not give an opinion regarding her urinary symptoms at this time.  Her PVR is 69 mL.  Her BP is 90/62.   She is not having gross hematuria or suprapubic pain.  She is not having fevers, chills, nausea or vomiting.    Spoke with Jamie Boyd at [facility] and reviewed the meds.  She is not on a  bladder agent at this time and they state she is soaking through her pads.    She is not using her vaginal estrogen cream three nights weekly.     PMH: Past Medical History:  Diagnosis Date  . Anemia   . Dementia   . Goiter   . Memory deficit     Surgical History: Past Surgical History:  Procedure Laterality Date  . COLONOSCOPY WITH PROPOFOL N/A 02/18/2015   Procedure: COLONOSCOPY WITH PROPOFOL;  Surgeon: Jamie Luster, MD;  Location: [facility] ENDOSCOPY;  Service: Gastroenterology;  Laterality: N/A;  . ESOPHAGOGASTRODUODENOSCOPY N/A 02/18/2015   Procedure: ESOPHAGOGASTRODUODENOSCOPY (EGD);  Surgeon: Jamie Luster, MD;  Location: [facility] ENDOSCOPY;  Service: Gastroenterology;  Laterality: N/A;    Home Medications:  Allergies as of 03/19/2017      Reactions   Pollen Extract Other (See Comments)      Medication List        Accurate as of 03/19/17  9:42 AM. Always use your most recent med list.          acetaminophen 500 MG tablet Commonly known as:  TYLENOL Take 500 mg by mouth every 6 (six) hours as needed.   amoxicillin-clavulanate 875-125 MG tablet Commonly known as:  AUGMENTIN Take 1 tablet by mouth 2 (two) times daily.  aspirin EC 81 MG tablet Take 1 tablet (81 mg total) by mouth daily.   atorvastatin 40 MG tablet Commonly known as:  LIPITOR Take 1 tablet (40 mg total) by mouth daily at 6 PM.   CENTRUM SILVER ULTRA WOMENS PO Take 1 tablet by mouth daily.   divalproex 250 MG DR tablet Commonly known as:  DEPAKOTE Take 250 mg by mouth 3 (three) times daily.   donepezil 10 MG tablet Commonly known as:  ARICEPT Take 10 mg by mouth at bedtime.   doxycycline 100 MG tablet Commonly known as:  VIBRA-TABS Take 100 mg by mouth 2 (two) times daily.   ferrous sulfate 325 (65 FE) MG EC tablet Take 325 mg by mouth 3 (three) times daily with meals.   fexofenadine 180 MG tablet Commonly known as:  ALLEGRA Take 180 mg by mouth daily.   glucosamine-chondroitin 500-400 MG  tablet Take 1 tablet by mouth 3 (three) times daily.   memantine 10 MG tablet Commonly known as:  NAMENDA Take 10 mg by mouth 2 (two) times daily.   MYRBETRIQ 25 MG Tb24 tablet Generic drug:  mirabegron ER Take 25 mg by mouth daily.   nicotine polacrilex 4 MG lozenge Commonly known as:  COMMIT Take 4 mg by mouth as needed for smoking cessation.   omega-3 acid ethyl esters 1 g capsule Commonly known as:  LOVAZA Take 1 g by mouth 2 (two) times daily.   QUEtiapine 25 MG tablet Commonly known as:  SEROQUEL Take 125 mg by mouth at bedtime   traZODone 100 MG tablet Commonly known as:  DESYREL Take 100 mg nightly at bedtime   vitamin E 400 UNIT capsule Take 400 Units by mouth daily.       Allergies:  Allergies  Allergen Reactions  . Pollen Extract Other (See Comments)    Family History: Family History  Problem Relation Age of Onset  . Diabetes Maternal Uncle   . Prostate cancer Neg Hx   . Bladder Cancer Neg Hx   . Kidney cancer Neg Hx     Social History:  reports that she quit smoking about 3 years ago. Her smoking use included cigarettes. she has never used smokeless tobacco. She reports that she does not drink alcohol or use drugs.  ROS: UROLOGY Frequent Urination?: No Hard to postpone urination?: No Burning/pain with urination?: No Get up at night to urinate?: No Leakage of urine?: No Urine stream starts and stops?: No Trouble starting stream?: Yes Do you have to strain to urinate?: No Blood in urine?: No Urinary tract infection?: No Sexually transmitted disease?: No Injury to kidneys or bladder?: Yes Painful intercourse?: No Weak stream?: No Currently pregnant?: No Vaginal bleeding?: No Last menstrual period?: n  Gastrointestinal Nausea?: No Vomiting?: No Indigestion/heartburn?: No Diarrhea?: No Constipation?: No  Constitutional Fever: No Night sweats?: No Weight loss?: No Fatigue?: No  Skin Skin rash/lesions?: No Itching?:  No  Eyes Blurred vision?: No Double vision?: No  Ears/Nose/Throat Sore throat?: No Sinus problems?: No  Hematologic/Lymphatic Swollen glands?: No Easy bruising?: No  Cardiovascular Leg swelling?: Yes Chest pain?: No  Respiratory Cough?: No Shortness of breath?: No  Endocrine Excessive thirst?: No  Musculoskeletal Back pain?: Yes Joint pain?: No  Neurological Headaches?: No Dizziness?: No  Psychologic Depression?: No Anxiety?: No  Physical Exam: BP 90/62   Pulse 79   Ht _0  (1.6 m)   Wt 132 lb (59.9 kg)   BMI 23.38 kg/m   Constitutional: Well nourished. Alert and  oriented, No acute distress. HEENT: [place] AT, moist mucus membranes. Trachea midline, no masses. Cardiovascular: No clubbing, cyanosis, or edema. Respiratory: Normal respiratory effort, no increased work of breathing. Skin: No rashes, bruises or suspicious lesions. Lymph: No cervical or inguinal adenopathy. Neurologic: Grossly intact, no focal deficits, moving all 4 extremities. Psychiatric: Normal mood and affect.  Laboratory Data: Lab Results  Component Value Date   WBC 10.7 02/26/2017   HGB 11.5 (L) 02/26/2017   HCT 34.7 (L) 02/26/2017   MCV 93.3 02/26/2017   PLT 227 02/26/2017    Lab Results  Component Value Date   CREATININE 0.82 02/26/2017    Lab Results  Component Value Date   AST 53 (H) 02/26/2017   Lab Results  Component Value Date   ALT 33 02/26/2017    I have reviewed the labs.   Assessment & Plan:    1. Recurrent UTI's  - criteria for recurrent UTI has now been met with 2 or more infections in 6 months or 3 or greater infections in one year   - Patient is again instructed to increase their water intake until the urine is pale yellow or clear (10 to 12 cups daily)   - Patient is again instructed to take probiotics (yogurt, oral pills or vaginal suppositories), take cranberry pills or drink the juice and Vitamin C 1,000 mg daily to acidify the urine should be  added to their daily regimen   - she is avoiding soaking in tubs   - encouraged to wipe front to back after urinating   - CATH UA was suspicious for infection - will send for culture - will wait on culture results before prescribing an antibiotic  2. Vaginal atrophy  - not having the estrogen vaginal cream three nights weekly   3. Incontinence  - offered behavioral therapies, bladder training, bladder control strategies and pelvic floor muscle training - patient with dementia  - fluid management - encouraged to drink 1.5 L of water daily  - trial of Myrbetriq 50 mg - RTC in 3 weeks for PVR and symptoms recheck                                        Return for PVR and symptom recheck.  These notes generated with voice recognition software. I apologize for typographical errors.  Zara Council, [facility] [address] [place], [place] [address] [phone]

## 2017-03-19 ENCOUNTER — Ambulatory Visit (INDEPENDENT_AMBULATORY_CARE_PROVIDER_SITE_OTHER): Payer: Medicare HMO | Admitting: Urology

## 2017-03-19 ENCOUNTER — Encounter: Payer: Self-pay | Admitting: Urology

## 2017-03-19 VITALS — BP 90/62 | HR 79 | Ht 63.0 in | Wt 132.0 lb

## 2017-03-19 DIAGNOSIS — N952 Postmenopausal atrophic vaginitis: Secondary | ICD-10-CM

## 2017-03-19 DIAGNOSIS — Z87440 Personal history of urinary (tract) infections: Secondary | ICD-10-CM

## 2017-03-19 DIAGNOSIS — N3946 Mixed incontinence: Secondary | ICD-10-CM | POA: Diagnosis not present

## 2017-03-19 LAB — BLADDER SCAN AMB NON-IMAGING: SCAN RESULT: 69

## 2017-03-19 MED ORDER — MIRABEGRON ER 50 MG PO TB24: 50.0000 mg | ORAL_TABLET | Freq: Every day | ORAL | 3 refills | Status: DC

## 2017-03-19 NOTE — Addendum Note (Signed)
Addended by: Lestine Box on: 03/19/2017 09:52 AM   Modules accepted: Orders

## 2017-03-20 DIAGNOSIS — Z7982 Long term (current) use of aspirin: Secondary | ICD-10-CM | POA: Diagnosis not present

## 2017-03-20 DIAGNOSIS — D126 Benign neoplasm of colon, unspecified: Secondary | ICD-10-CM | POA: Diagnosis not present

## 2017-03-20 DIAGNOSIS — D509 Iron deficiency anemia, unspecified: Secondary | ICD-10-CM | POA: Diagnosis not present

## 2017-03-20 DIAGNOSIS — M549 Dorsalgia, unspecified: Secondary | ICD-10-CM | POA: Diagnosis not present

## 2017-03-20 DIAGNOSIS — G8929 Other chronic pain: Secondary | ICD-10-CM | POA: Diagnosis not present

## 2017-03-20 DIAGNOSIS — F0280 Dementia in other diseases classified elsewhere without behavioral disturbance: Secondary | ICD-10-CM | POA: Diagnosis not present

## 2017-03-20 DIAGNOSIS — G309 Alzheimer's disease, unspecified: Secondary | ICD-10-CM | POA: Diagnosis not present

## 2017-03-20 DIAGNOSIS — K219 Gastro-esophageal reflux disease without esophagitis: Secondary | ICD-10-CM | POA: Diagnosis not present

## 2017-03-20 DIAGNOSIS — M6281 Muscle weakness (generalized): Secondary | ICD-10-CM | POA: Diagnosis not present

## 2017-03-22 DIAGNOSIS — G8929 Other chronic pain: Secondary | ICD-10-CM | POA: Diagnosis not present

## 2017-03-22 DIAGNOSIS — F0280 Dementia in other diseases classified elsewhere without behavioral disturbance: Secondary | ICD-10-CM | POA: Diagnosis not present

## 2017-03-22 DIAGNOSIS — Z7982 Long term (current) use of aspirin: Secondary | ICD-10-CM | POA: Diagnosis not present

## 2017-03-22 DIAGNOSIS — M6281 Muscle weakness (generalized): Secondary | ICD-10-CM | POA: Diagnosis not present

## 2017-03-22 DIAGNOSIS — D509 Iron deficiency anemia, unspecified: Secondary | ICD-10-CM | POA: Diagnosis not present

## 2017-03-22 DIAGNOSIS — D126 Benign neoplasm of colon, unspecified: Secondary | ICD-10-CM | POA: Diagnosis not present

## 2017-03-22 DIAGNOSIS — G309 Alzheimer's disease, unspecified: Secondary | ICD-10-CM | POA: Diagnosis not present

## 2017-03-22 DIAGNOSIS — K219 Gastro-esophageal reflux disease without esophagitis: Secondary | ICD-10-CM | POA: Diagnosis not present

## 2017-03-22 DIAGNOSIS — M549 Dorsalgia, unspecified: Secondary | ICD-10-CM | POA: Diagnosis not present

## 2017-03-23 DIAGNOSIS — G309 Alzheimer's disease, unspecified: Secondary | ICD-10-CM | POA: Diagnosis not present

## 2017-03-23 DIAGNOSIS — D509 Iron deficiency anemia, unspecified: Secondary | ICD-10-CM | POA: Diagnosis not present

## 2017-03-23 DIAGNOSIS — Z7982 Long term (current) use of aspirin: Secondary | ICD-10-CM | POA: Diagnosis not present

## 2017-03-23 DIAGNOSIS — G8929 Other chronic pain: Secondary | ICD-10-CM | POA: Diagnosis not present

## 2017-03-23 DIAGNOSIS — F0280 Dementia in other diseases classified elsewhere without behavioral disturbance: Secondary | ICD-10-CM | POA: Diagnosis not present

## 2017-03-23 DIAGNOSIS — M549 Dorsalgia, unspecified: Secondary | ICD-10-CM | POA: Diagnosis not present

## 2017-03-23 DIAGNOSIS — D126 Benign neoplasm of colon, unspecified: Secondary | ICD-10-CM | POA: Diagnosis not present

## 2017-03-23 DIAGNOSIS — M6281 Muscle weakness (generalized): Secondary | ICD-10-CM | POA: Diagnosis not present

## 2017-03-23 DIAGNOSIS — K219 Gastro-esophageal reflux disease without esophagitis: Secondary | ICD-10-CM | POA: Diagnosis not present

## 2017-03-26 DIAGNOSIS — G309 Alzheimer's disease, unspecified: Secondary | ICD-10-CM | POA: Diagnosis not present

## 2017-03-26 DIAGNOSIS — M6281 Muscle weakness (generalized): Secondary | ICD-10-CM | POA: Diagnosis not present

## 2017-03-26 DIAGNOSIS — F0280 Dementia in other diseases classified elsewhere without behavioral disturbance: Secondary | ICD-10-CM | POA: Diagnosis not present

## 2017-03-26 DIAGNOSIS — D509 Iron deficiency anemia, unspecified: Secondary | ICD-10-CM | POA: Diagnosis not present

## 2017-03-26 DIAGNOSIS — M549 Dorsalgia, unspecified: Secondary | ICD-10-CM | POA: Diagnosis not present

## 2017-03-26 DIAGNOSIS — K219 Gastro-esophageal reflux disease without esophagitis: Secondary | ICD-10-CM | POA: Diagnosis not present

## 2017-03-26 DIAGNOSIS — Z7982 Long term (current) use of aspirin: Secondary | ICD-10-CM | POA: Diagnosis not present

## 2017-03-26 DIAGNOSIS — G8929 Other chronic pain: Secondary | ICD-10-CM | POA: Diagnosis not present

## 2017-03-26 DIAGNOSIS — D126 Benign neoplasm of colon, unspecified: Secondary | ICD-10-CM | POA: Diagnosis not present

## 2017-03-27 DIAGNOSIS — D126 Benign neoplasm of colon, unspecified: Secondary | ICD-10-CM | POA: Diagnosis not present

## 2017-03-27 DIAGNOSIS — Z7982 Long term (current) use of aspirin: Secondary | ICD-10-CM | POA: Diagnosis not present

## 2017-03-27 DIAGNOSIS — F0280 Dementia in other diseases classified elsewhere without behavioral disturbance: Secondary | ICD-10-CM | POA: Diagnosis not present

## 2017-03-27 DIAGNOSIS — D509 Iron deficiency anemia, unspecified: Secondary | ICD-10-CM | POA: Diagnosis not present

## 2017-03-27 DIAGNOSIS — M549 Dorsalgia, unspecified: Secondary | ICD-10-CM | POA: Diagnosis not present

## 2017-03-27 DIAGNOSIS — G309 Alzheimer's disease, unspecified: Secondary | ICD-10-CM | POA: Diagnosis not present

## 2017-03-27 DIAGNOSIS — K219 Gastro-esophageal reflux disease without esophagitis: Secondary | ICD-10-CM | POA: Diagnosis not present

## 2017-03-27 DIAGNOSIS — G8929 Other chronic pain: Secondary | ICD-10-CM | POA: Diagnosis not present

## 2017-03-27 DIAGNOSIS — M6281 Muscle weakness (generalized): Secondary | ICD-10-CM | POA: Diagnosis not present

## 2017-03-29 DIAGNOSIS — M6281 Muscle weakness (generalized): Secondary | ICD-10-CM | POA: Diagnosis not present

## 2017-03-29 DIAGNOSIS — K219 Gastro-esophageal reflux disease without esophagitis: Secondary | ICD-10-CM | POA: Diagnosis not present

## 2017-03-29 DIAGNOSIS — G8929 Other chronic pain: Secondary | ICD-10-CM | POA: Diagnosis not present

## 2017-03-29 DIAGNOSIS — D126 Benign neoplasm of colon, unspecified: Secondary | ICD-10-CM | POA: Diagnosis not present

## 2017-03-29 DIAGNOSIS — Z7982 Long term (current) use of aspirin: Secondary | ICD-10-CM | POA: Diagnosis not present

## 2017-03-29 DIAGNOSIS — F0280 Dementia in other diseases classified elsewhere without behavioral disturbance: Secondary | ICD-10-CM | POA: Diagnosis not present

## 2017-03-29 DIAGNOSIS — G309 Alzheimer's disease, unspecified: Secondary | ICD-10-CM | POA: Diagnosis not present

## 2017-03-29 DIAGNOSIS — D509 Iron deficiency anemia, unspecified: Secondary | ICD-10-CM | POA: Diagnosis not present

## 2017-03-29 DIAGNOSIS — M549 Dorsalgia, unspecified: Secondary | ICD-10-CM | POA: Diagnosis not present

## 2017-04-03 DIAGNOSIS — K219 Gastro-esophageal reflux disease without esophagitis: Secondary | ICD-10-CM | POA: Diagnosis not present

## 2017-04-03 DIAGNOSIS — F0280 Dementia in other diseases classified elsewhere without behavioral disturbance: Secondary | ICD-10-CM | POA: Diagnosis not present

## 2017-04-03 DIAGNOSIS — G309 Alzheimer's disease, unspecified: Secondary | ICD-10-CM | POA: Diagnosis not present

## 2017-04-03 DIAGNOSIS — M6281 Muscle weakness (generalized): Secondary | ICD-10-CM | POA: Diagnosis not present

## 2017-04-03 DIAGNOSIS — Z7982 Long term (current) use of aspirin: Secondary | ICD-10-CM | POA: Diagnosis not present

## 2017-04-03 DIAGNOSIS — G8929 Other chronic pain: Secondary | ICD-10-CM | POA: Diagnosis not present

## 2017-04-03 DIAGNOSIS — D509 Iron deficiency anemia, unspecified: Secondary | ICD-10-CM | POA: Diagnosis not present

## 2017-04-03 DIAGNOSIS — D126 Benign neoplasm of colon, unspecified: Secondary | ICD-10-CM | POA: Diagnosis not present

## 2017-04-03 DIAGNOSIS — M549 Dorsalgia, unspecified: Secondary | ICD-10-CM | POA: Diagnosis not present

## 2017-04-04 ENCOUNTER — Other Ambulatory Visit: Payer: Self-pay

## 2017-04-04 ENCOUNTER — Encounter: Payer: Self-pay | Admitting: Emergency Medicine

## 2017-04-04 ENCOUNTER — Emergency Department
Admission: EM | Admit: 2017-04-04 | Discharge: 2017-04-04 | Disposition: A | Discharge status: Home / Self Care | Payer: Medicare HMO | Attending: Emergency Medicine | Admitting: Emergency Medicine

## 2017-04-04 ENCOUNTER — Emergency Department: Payer: Medicare HMO

## 2017-04-04 DIAGNOSIS — R1010 Upper abdominal pain, unspecified: Secondary | ICD-10-CM

## 2017-04-04 DIAGNOSIS — M549 Dorsalgia, unspecified: Secondary | ICD-10-CM | POA: Diagnosis not present

## 2017-04-04 DIAGNOSIS — G309 Alzheimer's disease, unspecified: Secondary | ICD-10-CM | POA: Diagnosis not present

## 2017-04-04 DIAGNOSIS — R748 Abnormal levels of other serum enzymes: Secondary | ICD-10-CM | POA: Diagnosis not present

## 2017-04-04 DIAGNOSIS — D126 Benign neoplasm of colon, unspecified: Secondary | ICD-10-CM | POA: Diagnosis not present

## 2017-04-04 DIAGNOSIS — D509 Iron deficiency anemia, unspecified: Secondary | ICD-10-CM | POA: Diagnosis not present

## 2017-04-04 DIAGNOSIS — Z79899 Other long term (current) drug therapy: Secondary | ICD-10-CM | POA: Insufficient documentation

## 2017-04-04 DIAGNOSIS — Z87891 Personal history of nicotine dependence: Secondary | ICD-10-CM | POA: Diagnosis not present

## 2017-04-04 DIAGNOSIS — Z7982 Long term (current) use of aspirin: Secondary | ICD-10-CM | POA: Diagnosis not present

## 2017-04-04 DIAGNOSIS — F0390 Unspecified dementia without behavioral disturbance: Secondary | ICD-10-CM | POA: Diagnosis not present

## 2017-04-04 DIAGNOSIS — K219 Gastro-esophageal reflux disease without esophagitis: Secondary | ICD-10-CM | POA: Diagnosis not present

## 2017-04-04 DIAGNOSIS — R109 Unspecified abdominal pain: Secondary | ICD-10-CM | POA: Diagnosis not present

## 2017-04-04 DIAGNOSIS — F0280 Dementia in other diseases classified elsewhere without behavioral disturbance: Secondary | ICD-10-CM | POA: Diagnosis not present

## 2017-04-04 DIAGNOSIS — G8929 Other chronic pain: Secondary | ICD-10-CM | POA: Diagnosis not present

## 2017-04-04 DIAGNOSIS — M6281 Muscle weakness (generalized): Secondary | ICD-10-CM | POA: Diagnosis not present

## 2017-04-04 LAB — COMPREHENSIVE METABOLIC PANEL
ALK PHOS: 67 U/L (ref 38–126)
ALT: 21 U/L (ref 14–54)
ANION GAP: 7 (ref 5–15)
AST: 26 U/L (ref 15–41)
Albumin: 2.9 g/dL — ABNORMAL LOW (ref 3.5–5.0)
BILIRUBIN TOTAL: 0.3 mg/dL (ref 0.3–1.2)
BUN: 12 mg/dL (ref 6–20)
CO2: 28 mmol/L (ref 22–32)
Calcium: 8.8 mg/dL — ABNORMAL LOW (ref 8.9–10.3)
Chloride: 106 mmol/L (ref 101–111)
Creatinine, Ser: 0.61 mg/dL (ref 0.44–1.00)
GFR calc non Af Amer: >60 mL/min (ref >60)
Glucose, Bld: 101 mg/dL — ABNORMAL HIGH (ref 65–99)
Potassium: 3.8 mmol/L (ref 3.5–5.1)
SODIUM: 141 mmol/L (ref 135–145)
TOTAL PROTEIN: 6.6 g/dL (ref 6.5–8.1)

## 2017-04-04 LAB — URINALYSIS, ROUTINE W REFLEX MICROSCOPIC
BILIRUBIN URINE: NEGATIVE
GLUCOSE, UA: NEGATIVE mg/dL
HGB URINE DIPSTICK: NEGATIVE
Ketones, ur: NEGATIVE mg/dL
Leukocytes, UA: NEGATIVE
Nitrite: NEGATIVE
PH: 7 (ref 5.0–8.0)
Protein, ur: NEGATIVE mg/dL
SPECIFIC GRAVITY, URINE: 1.01 (ref 1.005–1.030)

## 2017-04-04 LAB — CBC WITH DIFFERENTIAL/PLATELET
Basophils Absolute: 0 10*3/uL (ref 0–0.1)
Basophils Relative: 0 %
EOS PCT: 2 %
Eosinophils Absolute: 0.2 10*3/uL (ref 0–0.7)
HCT: 31.3 % — ABNORMAL LOW (ref 35.0–47.0)
HEMOGLOBIN: 10.3 g/dL — AB (ref 12.0–16.0)
LYMPHS ABS: 1.9 10*3/uL (ref 1.0–3.6)
Lymphocytes Relative: 19 %
MCH: 29.8 pg (ref 26.0–34.0)
MCHC: 32.8 g/dL (ref 32.0–36.0)
MCV: 90.7 fL (ref 80.0–100.0)
MONOS PCT: 14 %
Monocytes Absolute: 1.4 10*3/uL — ABNORMAL HIGH (ref 0.2–0.9)
NEUTROS PCT: 65 %
Neutro Abs: 6.8 10*3/uL — ABNORMAL HIGH (ref 1.4–6.5)
Platelets: 280 10*3/uL (ref 150–440)
RBC: 3.46 MIL/uL — ABNORMAL LOW (ref 3.80–5.20)
RDW: 15.4 % — ABNORMAL HIGH (ref 11.5–14.5)
WBC: 10.4 10*3/uL (ref 3.6–11.0)

## 2017-04-04 LAB — LIPASE, BLOOD: LIPASE: 254 U/L — AB (ref 11–51)

## 2017-04-04 LAB — TROPONIN I

## 2017-04-04 MED ORDER — SODIUM CHLORIDE 0.9 % IV BOLUS (SEPSIS)
500.0000 mL | INTRAVENOUS | Status: AC
  Administered 2017-04-04: 500 mL via INTRAVENOUS

## 2017-04-04 MED ORDER — IOPAMIDOL (ISOVUE-300) INJECTION 61%
100.0000 mL | Freq: Once | INTRAVENOUS | Status: AC | PRN
  Administered 2017-04-04: 100 mL via INTRAVENOUS

## 2017-04-04 NOTE — ED Notes (Signed)
NAD noted at time of D/C. Pt taken to the lobby via wheelchair by this RN. Pt D/C into the care of her brother and legal guardian [name].

## 2017-04-04 NOTE — Discharge Instructions (Signed)
Jamie Boyd evaluation in the emergency department was generally reassuring.  Her CT scan did not show any acute abnormalities, and her lab work was normal with one exception; she has an elevated lipase, which can be an indication of acute pancreatitis.  However, as stated before, she has no evidence of pancreatitis on her CT scan, she has no tenderness to palpation of her abdomen, and she was able to eat a meal with no discomfort, no nausea, no vomiting, etc.  If possible it would be best for her to stick to a bland diet.  She should follow-up with her regular doctor at the next available opportunity.  However there is no indication for admission to the hospital at this time.  Return to the emergency department if you develop new or worsening symptoms that concern you.

## 2017-04-04 NOTE — ED Provider Notes (Signed)
Signout from Dr. Karma Greaser in this 67 year old female with abdominal pain.  Dr. Karma Greaser reported no abdominal tenderness despite the elevated lipase.  Plan is for discharge when legal guardian has arrived.  Patient has been able to eat without any complaint.  No vomiting.  Physical Exam  BP 121/67   Pulse 68   Temp 98.3 F (36.8 C) (Oral)   Resp 13   Ht 5\' 3"  (1.6 m)   Wt 59.9 kg (132 lb)   SpO2 95%   BMI 23.38 kg/m   Physical Exam  ED Course/Procedures   Clinical Course as of Apr 04 857  Wed Apr 04, 2017  0356 The patient's labs are generally reassuring that she does have an elevated lipase at 254 which could account for the acute pain that was described.  I will evaluate with a CT scan to look for signs of pancreatitis, understanding that an ultrasound may be necessary as well.  I do not have any history on her regarding whether or not she has had a cholecystectomy.  Also providing a small fluid bolus.  [CF]  0515 CT scan was unremarkable.  I checked on her she reports no pain and she has no tenderness to palpation on repeat exam.  We are going to give her a meal to make sure she does not have any recurrence of pain or any nausea/vomiting with eating.  If she does not, she should be appropriate for discharge and outpatient follow-up.  [CF]  [phone] Tried to discharge the patient but the son/legal guardian is currently unavailable.  We will continue to try to get hold of him so he is aware and can come pick her up.  [CF]  1694 Transferred ED care to Dr. Clearnce Hasten to discharge when legal guardian is available and has been contacted.  [CF]    Clinical Course User Index [CF] Hinda Kehr, MD    Procedures  MDM  Legal guardian at bedside.  Patient will be discharged at this time.  Continues without any complaints or with any vomiting after food.      Orbie Pyo, MD 04/04/17 0900

## 2017-04-04 NOTE — ED Notes (Signed)
Patient was given meal tray, has tolerated eating whole meal tray at this point, as well as drinking grape juice.

## 2017-04-04 NOTE — ED Triage Notes (Addendum)
Patient to ER from [facility] via [facility] for c/o abd pain to mid abd. Per staff, patient was curled into a ball d/t abd pain. Upon EMS arrival, patient denied any abd pain. Upon further questioning by EMS, patient reported abd pain to mid abd, but has h/o dementia and is poor historian. Patient shows no overt signs of pain at this time.

## 2017-04-04 NOTE — ED Notes (Signed)
Patient's legal guardian present in ER.

## 2017-04-04 NOTE — ED Notes (Signed)
Report given to Megan, RN.

## 2017-04-04 NOTE — ED Notes (Signed)
Left message for Jamie Boyd (legal guardian) to call back for update on patient (patient ready to be discharged, and to discuss findings with Dr. Karma Greaser).

## 2017-04-04 NOTE — ED Provider Notes (Signed)
[facility] Emergency Department Provider Note  ____________________________________________   First MD Initiated Contact with Patient 04/04/17 0217     (approximate)  I have reviewed the triage vital signs and the nursing notes.   HISTORY  Chief Complaint Abdominal Pain  Level 5 caveat:  history/ROS limited by chronic dementia  HPI Jamie Boyd is a 67 y.o. female with medical history as listed below (which notably includes dementia) who presents by EMS for evaluation of abdominal pain.  Paramedics report that the facility called him out and told him that the patient had been curled up crying out due to abdominal pain.  The patient was sitting comfortably in a wheelchair when they arrived.  She does state that she remembers having abdominal pain but her dementia limits the ability to get a conference of history.  There is no report of nausea or vomiting, diarrhea, chest pain, nor shortness of breath.  The patient states that her abdomen did hurt and points to the middle of her abdomen when asked location of the pain.  There is no history of fever or chills.  The patient is in no distress and is awake, alert, and pleasant at this time.  Past Medical History:  Diagnosis Date  . Anemia   . Dementia   . Goiter   . Memory deficit     Patient Active Problem List   Diagnosis Date Noted  . CVA (cerebral vascular accident) ([place]) 01/24/2017  . Dehydration 01/20/2017  . Failure to thrive in adult 01/20/2017  . Hypotension 01/20/2017  . Agitation 11/23/2015  . Moderate dementia, with behavioral disturbance 11/23/2015  . Gastroesophageal reflux disease without esophagitis 09/11/2015  . Iron deficiency anemia due to chronic blood loss 06/02/2015  . GERD without esophagitis 06/01/2015  . Difficulty sleeping 04/27/2015  . Moderate dementia without behavioral disturbance 03/01/2015  . Acute UTI 01/29/2015  . Anemia 01/29/2015  . Memory deficit 01/29/2015     Past Surgical History:  Procedure Laterality Date  . COLONOSCOPY WITH PROPOFOL N/A 02/18/2015   Procedure: COLONOSCOPY WITH PROPOFOL;  Surgeon: Hulen Luster, MD;  Location: [facility] ENDOSCOPY;  Service: Gastroenterology;  Laterality: N/A;  . ESOPHAGOGASTRODUODENOSCOPY N/A 02/18/2015   Procedure: ESOPHAGOGASTRODUODENOSCOPY (EGD);  Surgeon: Hulen Luster, MD;  Location: [facility] ENDOSCOPY;  Service: Gastroenterology;  Laterality: N/A;    Prior to Admission medications   Medication Sig Start Date End Date Taking? Authorizing Provider  azelastine (ASTELIN) 0.1 % nasal spray Place 1 spray into both nostrils 2 (two) times daily. Use in each nostril as directed   Yes [provider]  memantine (NAMENDA) 10 MG tablet Take 10 mg by mouth 2 (two) times daily.  11/23/15 11/27/17 Yes [provider]  mirabegron ER (MYRBETRIQ) 50 MG TB24 tablet Take 1 tablet (50 mg total) by mouth daily. 03/19/17  Yes McGowan, Hunt Oris, PA-C  aspirin EC 81 MG tablet Take 1 tablet (81 mg total) by mouth daily. Patient not taking: Reported on 04/04/2017 01/27/17   Henreitta Leber, MD  atorvastatin (LIPITOR) 40 MG tablet Take 1 tablet (40 mg total) by mouth daily at 6 PM. Patient not taking: Reported on 04/04/2017 01/27/17   Henreitta Leber, MD  mirabegron ER (MYRBETRIQ) 50 MG TB24 tablet Take 1 tablet (50 mg total) by mouth daily. Patient not taking: Reported on 04/04/2017 03/19/17   Zara Council A, PA-C    Allergies Pollen extract  Family History  Problem Relation Age of Onset  . Diabetes Maternal Uncle   .  Prostate cancer Neg Hx   . Bladder Cancer Neg Hx   . Kidney cancer Neg Hx     Social History Social History   Tobacco Use  . Smoking status: Former Smoker    Types: Cigarettes    Last attempt to quit: 01/25/2014    Years since quitting: 3.1  . Smokeless tobacco: Never Used  Substance Use Topics  . Alcohol use: No  . Drug use: No    Review of Systems Level 5 caveat:  history/ROS limited by  chronic dementia  ____________________________________________   PHYSICAL EXAM:  ED Triage Vitals  Enc Vitals Group     BP 04/04/17 0219 127/87     Pulse Rate 04/04/17 0219 75     Resp 04/04/17 0219 18     Temp 04/04/17 0219 98.3 F (36.8 C)     Temp Source 04/04/17 0219 Oral     SpO2 04/04/17 0219 98 %     Weight 04/04/17 0221 59.9 kg (132 lb)     Height 04/04/17 0221 1.6 m (5\' 3" )     Head Circumference --      Peak Flow --      Pain Score --      Pain Loc --      Pain Edu? --      Excl. in [place]? --      Constitutional: Alert and oriented to self and location. Well appearing and in no acute distress. Eyes: Conjunctivae are normal.  Head: Atraumatic. Nose: No congestion/rhinnorhea. Mouth/Throat: Mucous membranes are moist. Neck: No stridor.  No meningeal signs.   Cardiovascular: Normal rate, regular rhythm. Good peripheral circulation. Grossly normal heart sounds. Respiratory: Normal respiratory effort.  No retractions. Lungs CTAB. Gastrointestinal: Soft and nontender. No distention.  Musculoskeletal: No lower extremity tenderness nor edema. No gross deformities of extremities. Neurologic:  Normal speech and language. No gross focal neurologic deficits are appreciated.  Skin:  Skin is warm, dry and intact. No rash noted. Psychiatric: Mood and affect are normal. Speech and behavior are normal.  ____________________________________________   LABS (all labs ordered are listed, but only abnormal results are displayed)  Labs Reviewed  URINALYSIS, ROUTINE W REFLEX MICROSCOPIC - Abnormal; Notable for the following components:      Result Value   Color, Urine YELLOW (*)    APPearance CLEAR (*)    All other components within normal limits  CBC WITH DIFFERENTIAL/PLATELET - Abnormal; Notable for the following components:   RBC 3.46 (*)    Hemoglobin 10.3 (*)    HCT 31.3 (*)    RDW 15.4 (*)    Neutro Abs 6.8 (*)    Monocytes Absolute 1.4 (*)    All other components  within normal limits  COMPREHENSIVE METABOLIC PANEL - Abnormal; Notable for the following components:   Glucose, Bld 101 (*)    Calcium 8.8 (*)    Albumin 2.9 (*)    All other components within normal limits  LIPASE, BLOOD - Abnormal; Notable for the following components:   Lipase 254 (*)    All other components within normal limits  TROPONIN I  CBC WITH DIFFERENTIAL/PLATELET   ____________________________________________  EKG  ED ECG REPORT I, Hinda Kehr, the attending physician, personally viewed and interpreted this ECG.  Date: 04/04/2017 EKG Time: 02:21 Rate: 71 Rhythm: normal sinus rhythm QRS Axis: normal Intervals: normal ST/T Wave abnormalities: Non-specific ST segment / T-wave changes, but no evidence of acute ischemia. Narrative Interpretation: no evidence of acute ischemia   ____________________________________________  RADIOLOGY   ED MD interpretation:  No acute abnormalities  Official radiology report(s): Ct Abdomen Pelvis W Contrast  Result Date: 04/04/2017 CLINICAL DATA:  67 year old female with generalized abdominal pain. EXAM: CT ABDOMEN AND PELVIS WITH CONTRAST TECHNIQUE: Multidetector CT imaging of the abdomen and pelvis was performed using the standard protocol following bolus administration of intravenous contrast. CONTRAST:  154mL ISOVUE-300 IOPAMIDOL (ISOVUE-300) INJECTION 61% COMPARISON:  Abdominal CT dated 11/08/2015 FINDINGS: Evaluation of this exam is limited due to respiratory motion artifact. Lower chest: Bibasilar linear atelectasis/scarring. No intra-abdominal free air or free fluid. Hepatobiliary: The liver is unremarkable. No intrahepatic biliary ductal dilatation. The gallbladder is unremarkable as well. Pancreas: Unremarkable. No pancreatic ductal dilatation or surrounding inflammatory changes. Spleen: Normal in size without focal abnormality. Adrenals/Urinary Tract: The adrenal glands are unremarkable. There is a 12 mm nonobstructing left  renal inferior pole calculus as seen on the prior CT. No hydronephrosis. The right kidney is unremarkable. There is uniform enhancement and symmetric excretion of contrast by both kidneys. The visualized ureters and urinary bladder appear unremarkable. Stomach/Bowel: There is moderate stool throughout the colon. There is no bowel obstruction or active inflammation. Normal appendix. Vascular/Lymphatic: There is moderate aortoiliac atherosclerotic disease. No portal venous gas. There is no adenopathy. Reproductive: Hysterectomy.  No pelvic mass. Other: None Musculoskeletal: Degenerative changes of the spine. Levoscoliosis at L3-L4. No acute osseous pathology. Small amount of fluid noted in the left trochanteric bursa. There is mild stranding of the subcutaneous soft tissues of the left hip over the left greater trochanter. IMPRESSION: 1. No acute intra-abdominal or pelvic pathology. 2. No definite inflammatory changes of the pancreas. Evaluation however is somewhat limited respiratory motion artifact. Correlation with pancreatic enzymes recommended to exclude acute pancreatitis. 3. No bowel obstruction or active inflammation.  Normal appendix. 4. Stable nonobstructing left renal inferior pole 12 mm stone. No hydronephrosis. 5. Fluid within the left to can take bursa. Correlation with clinical exam is recommended to evaluate for bursitis. 6.  Aortic Atherosclerosis (ICD10-I70.0). Electronically Signed   By: Anner Crete M.D.   On: 04/04/2017 04:43    ____________________________________________   PROCEDURES  Critical Care performed: No   Procedure(s) performed:   Procedures   ____________________________________________   INITIAL IMPRESSION / ASSESSMENT AND PLAN / ED COURSE  As part of my medical decision making, I reviewed the following data within the [place] notes reviewed and incorporated, Labs reviewed , Old chart reviewed and Notes from prior ED visits      Differential diagnosis includes, but is not limited to, ovarian cyst, ovarian torsion, acute appendicitis, diverticulitis, urinary tract infection/pyelonephritis, endometriosis, bowel obstruction, volvulus, intermittent intussusception, colitis, renal colic, gastroenteritis, hernia,  etc.  The patient is in no distress right now and had no tenderness to palpation, even with deep palpation of her abdomen.  Her lungs are clear and she has no obvious abnormalities on her physical exam.  Vital signs are stable.  I will evaluate broadly given that the patient cannot give a conference of history but there is no evidence of acute or emergent medical condition at this time.   Clinical Course as of Apr 05 719  Wed Apr 04, 2017  0356 The patient's labs are generally reassuring that she does have an elevated lipase at 254 which could account for the acute pain that was described.  I will evaluate with a CT scan to look for signs of pancreatitis, understanding that an ultrasound may be necessary as well.  I  do not have any history on her regarding whether or not she has had a cholecystectomy.  Also providing a small fluid bolus.  [CF]  0515 CT scan was unremarkable.  I checked on her she reports no pain and she has no tenderness to palpation on repeat exam.  We are going to give her a meal to make sure she does not have any recurrence of pain or any nausea/vomiting with eating.  If she does not, she should be appropriate for discharge and outpatient follow-up.  [CF]  [phone] Tried to discharge the patient but the son/legal guardian is currently unavailable.  We will continue to try to get hold of him so he is aware and can come pick her up.  [CF]  0349 Transferred ED care to Dr. Clearnce Hasten to discharge when legal guardian is available and has been contacted.  [CF]    Clinical Course User Index [CF] Hinda Kehr, MD    ____________________________________________  FINAL CLINICAL IMPRESSION(S) / ED  DIAGNOSES  Final diagnoses:  Pain of upper abdomen  Elevated lipase     MEDICATIONS GIVEN DURING THIS VISIT:  Medications  sodium chloride 0.9 % bolus 500 mL (0 mLs Intravenous Stopped 04/04/17 0545)  iopamidol (ISOVUE-300) 61 % injection 100 mL (100 mLs Intravenous Contrast Given 04/04/17 0415)     ED Discharge Orders    None       Note:  This document was prepared using Dragon voice recognition software and may include unintentional dictation errors.    Hinda Kehr, MD 04/04/17 [phone]

## 2017-04-05 DIAGNOSIS — G309 Alzheimer's disease, unspecified: Secondary | ICD-10-CM | POA: Diagnosis not present

## 2017-04-05 DIAGNOSIS — D126 Benign neoplasm of colon, unspecified: Secondary | ICD-10-CM | POA: Diagnosis not present

## 2017-04-05 DIAGNOSIS — M549 Dorsalgia, unspecified: Secondary | ICD-10-CM | POA: Diagnosis not present

## 2017-04-05 DIAGNOSIS — F0280 Dementia in other diseases classified elsewhere without behavioral disturbance: Secondary | ICD-10-CM | POA: Diagnosis not present

## 2017-04-05 DIAGNOSIS — K219 Gastro-esophageal reflux disease without esophagitis: Secondary | ICD-10-CM | POA: Diagnosis not present

## 2017-04-05 DIAGNOSIS — Z7982 Long term (current) use of aspirin: Secondary | ICD-10-CM | POA: Diagnosis not present

## 2017-04-05 DIAGNOSIS — M6281 Muscle weakness (generalized): Secondary | ICD-10-CM | POA: Diagnosis not present

## 2017-04-05 DIAGNOSIS — D509 Iron deficiency anemia, unspecified: Secondary | ICD-10-CM | POA: Diagnosis not present

## 2017-04-05 DIAGNOSIS — G8929 Other chronic pain: Secondary | ICD-10-CM | POA: Diagnosis not present

## 2017-04-09 ENCOUNTER — Emergency Department: Payer: Medicare HMO

## 2017-04-09 ENCOUNTER — Other Ambulatory Visit: Payer: Self-pay

## 2017-04-09 ENCOUNTER — Encounter: Payer: Self-pay | Admitting: *Deleted

## 2017-04-09 ENCOUNTER — Encounter: Payer: Self-pay | Admitting: Urology

## 2017-04-09 ENCOUNTER — Emergency Department
Admission: EM | Admit: 2017-04-09 | Discharge: 2017-04-09 | Disposition: A | Discharge status: Home / Self Care | Payer: Medicare HMO | Attending: Emergency Medicine | Admitting: Emergency Medicine

## 2017-04-09 ENCOUNTER — Ambulatory Visit: Payer: Medicare HMO | Admitting: Urology

## 2017-04-09 DIAGNOSIS — J101 Influenza due to other identified influenza virus with other respiratory manifestations: Secondary | ICD-10-CM | POA: Insufficient documentation

## 2017-04-09 DIAGNOSIS — R4182 Altered mental status, unspecified: Secondary | ICD-10-CM | POA: Diagnosis not present

## 2017-04-09 DIAGNOSIS — Z79899 Other long term (current) drug therapy: Secondary | ICD-10-CM | POA: Diagnosis not present

## 2017-04-09 DIAGNOSIS — G309 Alzheimer's disease, unspecified: Secondary | ICD-10-CM | POA: Diagnosis not present

## 2017-04-09 DIAGNOSIS — Z743 Need for continuous supervision: Secondary | ICD-10-CM | POA: Diagnosis not present

## 2017-04-09 DIAGNOSIS — Z87891 Personal history of nicotine dependence: Secondary | ICD-10-CM | POA: Diagnosis not present

## 2017-04-09 DIAGNOSIS — M6281 Muscle weakness (generalized): Secondary | ICD-10-CM | POA: Diagnosis not present

## 2017-04-09 DIAGNOSIS — R509 Fever, unspecified: Secondary | ICD-10-CM | POA: Diagnosis not present

## 2017-04-09 DIAGNOSIS — Z8673 Personal history of transient ischemic attack (TIA), and cerebral infarction without residual deficits: Secondary | ICD-10-CM | POA: Insufficient documentation

## 2017-04-09 DIAGNOSIS — R000 Tachycardia, unspecified: Secondary | ICD-10-CM | POA: Diagnosis not present

## 2017-04-09 LAB — URINALYSIS, COMPLETE (UACMP) WITH MICROSCOPIC
BACTERIA UA: NONE SEEN
Bilirubin Urine: NEGATIVE
Glucose, UA: NEGATIVE mg/dL
Hgb urine dipstick: NEGATIVE
KETONES UR: NEGATIVE mg/dL
Leukocytes, UA: NEGATIVE
Nitrite: NEGATIVE
Protein, ur: NEGATIVE mg/dL
Specific Gravity, Urine: 1.011 (ref 1.005–1.030)
pH: 7 (ref 5.0–8.0)

## 2017-04-09 LAB — INFLUENZA PANEL BY PCR (TYPE A & B)
Influenza A By PCR: POSITIVE — AB
Influenza B By PCR: NEGATIVE

## 2017-04-09 LAB — CBC WITH DIFFERENTIAL/PLATELET
BASOS PCT: 1 %
Basophils Absolute: 0.1 10*3/uL (ref 0–0.1)
Eosinophils Absolute: 0.1 10*3/uL (ref 0–0.7)
Eosinophils Relative: 2 %
HEMATOCRIT: 33.8 % — AB (ref 35.0–47.0)
Hemoglobin: 11.3 g/dL — ABNORMAL LOW (ref 12.0–16.0)
Lymphocytes Relative: 6 %
Lymphs Abs: 0.5 10*3/uL — ABNORMAL LOW (ref 1.0–3.6)
MCH: 30.2 pg (ref 26.0–34.0)
MCHC: 33.5 g/dL (ref 32.0–36.0)
MCV: 90.2 fL (ref 80.0–100.0)
MONO ABS: 1.6 10*3/uL — AB (ref 0.2–0.9)
MONOS PCT: 20 %
NEUTROS ABS: 6.1 10*3/uL (ref 1.4–6.5)
Neutrophils Relative %: 73 %
Platelets: 203 10*3/uL (ref 150–440)
RBC: 3.75 MIL/uL — ABNORMAL LOW (ref 3.80–5.20)
RDW: 15.7 % — AB (ref 11.5–14.5)
WBC: 8.4 10*3/uL (ref 3.6–11.0)

## 2017-04-09 LAB — COMPREHENSIVE METABOLIC PANEL
ALT: 34 U/L (ref 14–54)
AST: 40 U/L (ref 15–41)
Albumin: 3.3 g/dL — ABNORMAL LOW (ref 3.5–5.0)
Alkaline Phosphatase: 65 U/L (ref 38–126)
Anion gap: 7 (ref 5–15)
BUN: 9 mg/dL (ref 6–20)
CO2: 29 mmol/L (ref 22–32)
Calcium: 9 mg/dL (ref 8.9–10.3)
Chloride: 104 mmol/L (ref 101–111)
Creatinine, Ser: 0.68 mg/dL (ref 0.44–1.00)
GFR calc Af Amer: >60 mL/min (ref >60)
GFR calc non Af Amer: >60 mL/min (ref >60)
Glucose, Bld: 118 mg/dL — ABNORMAL HIGH (ref 65–99)
Potassium: 3.8 mmol/L (ref 3.5–5.1)
Sodium: 140 mmol/L (ref 135–145)
Total Bilirubin: 0.6 mg/dL (ref 0.3–1.2)
Total Protein: 7.2 g/dL (ref 6.5–8.1)

## 2017-04-09 LAB — PROTIME-INR
INR: 0.92
Prothrombin Time: 12.3 seconds (ref 11.4–15.2)

## 2017-04-09 LAB — LACTIC ACID, PLASMA: Lactic Acid, Venous: 1.8 mmol/L (ref 0.5–1.9)

## 2017-04-09 MED ORDER — OSELTAMIVIR PHOSPHATE 75 MG PO CAPS
75.0000 mg | ORAL_CAPSULE | Freq: Once | ORAL | Status: AC
  Administered 2017-04-09: 75 mg via ORAL
  Filled 2017-04-09: qty 1

## 2017-04-09 MED ORDER — ACETAMINOPHEN 500 MG PO TABS
1000.0000 mg | ORAL_TABLET | Freq: Once | ORAL | Status: AC
  Administered 2017-04-09: 1000 mg via ORAL
  Filled 2017-04-09: qty 2

## 2017-04-09 MED ORDER — OSELTAMIVIR PHOSPHATE 75 MG PO CAPS: 75.0000 mg | ORAL_CAPSULE | Freq: Two times a day (BID) | ORAL | 0 refills | Status: AC

## 2017-04-09 MED ORDER — SODIUM CHLORIDE 0.9 % IV BOLUS (SEPSIS)
1000.0000 mL | Freq: Once | INTRAVENOUS | Status: AC
  Administered 2017-04-09: 1000 mL via INTRAVENOUS

## 2017-04-09 NOTE — ED Notes (Signed)
RN called and gave report to [place], at [facility]. Updated that pt is discharged and [place] confirmed that EMS would need to provide transportation home.

## 2017-04-09 NOTE — ED Notes (Signed)
RN called and spoke to [facility] about treatment. POA updated that pt is discharged and POA verbalized to send pt via EMS.

## 2017-04-09 NOTE — ED Notes (Signed)
Healthcare POA called this RN and RN was able to update on pts status and care decisions to this point. POA verbalized understanding.

## 2017-04-09 NOTE — ED Provider Notes (Signed)
[facility] Emergency Department Provider Note  ____________________________________________  Time seen: Approximately 4:37 PM  I have reviewed the triage vital signs and the nursing notes.   HISTORY  Chief Complaint Altered Mental Status and Fever  Level 5 caveat:  Portions of the history and physical were unable to be obtained due to dementia   HPI Jamie Boyd is a 67 y.o. female with a history of Alzheimer's dementia, CVA, recurrent UTIs, and anemia who presents from her nursing home for altered mental status and fever. according to EMS patient had decreased level of consciousness, decreased oral intake and a fever that started today. no vomiting, no cough, no diarrhea. patient will answer her name but will not answer any other questions at this time. According to EMS patient was recently diagnosed with diverticulitis and started on doxycycline a week ago.  Past Medical History:  Diagnosis Date  . Anemia   . Dementia   . Goiter   . Memory deficit     Patient Active Problem List   Diagnosis Date Noted  . CVA (cerebral vascular accident) ([place]) 01/24/2017  . Dehydration 01/20/2017  . Failure to thrive in adult 01/20/2017  . Hypotension 01/20/2017  . Agitation 11/23/2015  . Moderate dementia, with behavioral disturbance 11/23/2015  . Gastroesophageal reflux disease without esophagitis 09/11/2015  . Iron deficiency anemia due to chronic blood loss 06/02/2015  . GERD without esophagitis 06/01/2015  . Difficulty sleeping 04/27/2015  . Moderate dementia without behavioral disturbance 03/01/2015  . Acute UTI 01/29/2015  . Anemia 01/29/2015  . Memory deficit 01/29/2015    Past Surgical History:  Procedure Laterality Date  . COLONOSCOPY WITH PROPOFOL N/A 02/18/2015   Procedure: COLONOSCOPY WITH PROPOFOL;  Surgeon: Hulen Luster, MD;  Location: [facility] ENDOSCOPY;  Service: Gastroenterology;  Laterality: N/A;  . ESOPHAGOGASTRODUODENOSCOPY N/A 02/18/2015   Procedure: ESOPHAGOGASTRODUODENOSCOPY (EGD);  Surgeon: Hulen Luster, MD;  Location: [facility] ENDOSCOPY;  Service: Gastroenterology;  Laterality: N/A;    Prior to Admission medications   Medication Sig Start Date End Date Taking? Authorizing Provider  aspirin EC 81 MG tablet Take 1 tablet (81 mg total) by mouth daily. Patient not taking: Reported on 04/04/2017 01/27/17   Henreitta Leber, MD  atorvastatin (LIPITOR) 40 MG tablet Take 1 tablet (40 mg total) by mouth daily at 6 PM. Patient not taking: Reported on 04/04/2017 01/27/17   Henreitta Leber, MD  azelastine (ASTELIN) 0.1 % nasal spray Place 1 spray into both nostrils 2 (two) times daily. Use in each nostril as directed    [provider]  memantine (NAMENDA) 10 MG tablet Take 10 mg by mouth 2 (two) times daily.  11/23/15 11/27/17  [provider]  mirabegron ER (MYRBETRIQ) 50 MG TB24 tablet Take 1 tablet (50 mg total) by mouth daily. Patient not taking: Reported on 04/04/2017 03/19/17   Zara Council A, PA-C  mirabegron ER (MYRBETRIQ) 50 MG TB24 tablet Take 1 tablet (50 mg total) by mouth daily. 03/19/17   Zara Council A, PA-C  oseltamivir (TAMIFLU) 75 MG capsule Take 1 capsule (75 mg total) by mouth 2 (two) times daily for 5 days. 04/09/17 04/14/17  Rudene Re, MD    Allergies Pollen extract  Family History  Problem Relation Age of Onset  . Diabetes Maternal Uncle   . Prostate cancer Neg Hx   . Bladder Cancer Neg Hx   . Kidney cancer Neg Hx     Social History Social History   Tobacco Use  .  Smoking status: Former Smoker    Types: Cigarettes    Last attempt to quit: 01/25/2014    Years since quitting: 3.2  . Smokeless tobacco: Never Used  Substance Use Topics  . Alcohol use: No  . Drug use: No    Review of Systems  Constitutional: + fever and confusion Respiratory: Negative for cough Gastrointestinal: Negative for vomiting or diarrhea. Skin: Negative for rash.  Level 5 caveat:  Portions of the  history and physical were unable to be obtained due to ams and dementia  ____________________________________________   PHYSICAL EXAM:  VITAL SIGNS: ED Triage Vitals  Enc Vitals Group     BP 04/09/17 1605 122/76     Pulse Rate 04/09/17 1605 (!) 105     Resp 04/09/17 1605 14     Temp 04/09/17 1605 (!) 101.9 F (38.8 C)     Temp Source 04/09/17 1605 Oral     SpO2 04/09/17 1631 100 %     Weight 04/09/17 1606 132 lb (59.9 kg)     Height 04/09/17 1606 5\' 3"  (1.6 m)     Head Circumference --      Peak Flow --      Pain Score --      Pain Loc --      Pain Edu? --      Excl. in [place]? --     Constitutional: Awake, staring, will make brief eye contact, oriented to self only, will not follow commands other than squeezing my hands, no distress. HEENT:      Head: Normocephalic and atraumatic.         Eyes: Conjunctivae are normal. Sclera is non-icteric.       Mouth/Throat: Mucous membranes are moist.       Neck: Supple with no signs of meningismus. Cardiovascular: tachycardic with regular rhythm. No murmurs, gallops, or rubs. 2+ symmetrical distal pulses are present in all extremities. No JVD. Respiratory: Normal respiratory effort. Shallow breathing, no crackles or wheezes but limited to shallow breathing Gastrointestinal: Soft, non tender, and non distended with positive bowel sounds. No rebound or guarding. Musculoskeletal:  No edema, cyanosis, or erythema of extremities. Neurologic:  Face is symmetric, equal grip bilaterally. Moving all extremities.  Skin: Skin is warm, dry and intact. No rash noted.   ____________________________________________   LABS (all labs ordered are listed, but only abnormal results are displayed)  Labs Reviewed  COMPREHENSIVE METABOLIC PANEL - Abnormal; Notable for the following components:      Result Value   Glucose, Bld 118 (*)    Albumin 3.3 (*)    All other components within normal limits  CBC WITH DIFFERENTIAL/PLATELET - Abnormal; Notable for  the following components:   RBC 3.75 (*)    Hemoglobin 11.3 (*)    HCT 33.8 (*)    RDW 15.7 (*)    Lymphs Abs 0.5 (*)    Monocytes Absolute 1.6 (*)    All other components within normal limits  URINALYSIS, COMPLETE (UACMP) WITH MICROSCOPIC - Abnormal; Notable for the following components:   Color, Urine YELLOW (*)    APPearance CLEAR (*)    Squamous Epithelial / LPF 0-5 (*)    All other components within normal limits  INFLUENZA PANEL BY PCR (TYPE A & B) - Abnormal; Notable for the following components:   Influenza A By PCR POSITIVE (*)    All other components within normal limits  CULTURE, BLOOD (ROUTINE X 2)  CULTURE, BLOOD (ROUTINE X 2)  LACTIC ACID,  PLASMA  PROTIME-INR   ____________________________________________  EKG  ED ECG REPORT I, Rudene Re, the attending physician, personally viewed and interpreted this ECG.  Sinus tachycardia, rate of 103, normal intervals, normal axis, no ST elevations or depressions. sinus tachycardia is new but no other changes when compared to prior. ____________________________________________  RADIOLOGY  I have personally reviewed the images performed during this visit and I agree with the Radiologist's read.   Interpretation by Radiologist:  Dg Chest 2 View  Result Date: 04/09/2017 CLINICAL DATA:  Decreased level of consciousness, fever, decreased PO intake EXAM: CHEST  2 VIEW COMPARISON:  01/20/2017 FINDINGS: Lungs are clear.  No pleural effusion or pneumothorax. The heart is normal in size. Visualized osseous structures are within normal limits. IMPRESSION: Normal chest radiographs. Electronically Signed   By: Julian Hy M.D.   On: 04/09/2017 16:59      ____________________________________________   PROCEDURES  Procedure(s) performed: None Procedures Critical Care performed:  None ____________________________________________   INITIAL IMPRESSION / ASSESSMENT AND PLAN / ED COURSE  67 y.o. female with a history  of Alzheimer's dementia, CVA, recurrent UTIs, and anemia who presents from her nursing home for altered mental status and fever.patient arrives with a fever of 101.9 and tachycardic with a pulse of 105. No obvious signs of infection on exam with a soft nontender abdomen, clear lungs and however exam is limited due to patient's inability to follow commands and shallow breathing, no evidence of ulcers or cellulitis, patient has no meningeal signs. We'll check labs including blood culture, lactate, urinalysis, chest x-ray, and flu swab to evaluate for source of possible infection.  Clinical Course as of Apr 09 1956  Mon Apr 09, 2017  1918 patient remains in no respiratory distress, labs showing normal white count, negative lactate, UA negative for UTI. Patient is influenza A positive. Chest x-ray with no evidence of pneumonia. Patient was started on Tamiflu. Vitals remained within normal limits. At this time and no signs of sepsis or respiratory distress patient is stable for discharge back to her nursing home.  [CV]    Clinical Course User Index [CV] Alfred Levins [place], MD     As part of my medical decision making, I reviewed the following data within the [place] notes reviewed and incorporated, Labs reviewed , EKG interpreted , Old EKG reviewed, Radiograph reviewed , Notes from prior ED visits and [place] Controlled Substance Database    Pertinent labs & imaging results that were available during my care of the patient were reviewed by me and considered in my medical decision making (see chart for details).    ____________________________________________   FINAL CLINICAL IMPRESSION(S) / ED DIAGNOSES  Final diagnoses:  Influenza A      NEW MEDICATIONS STARTED DURING THIS VISIT:  ED Discharge Orders        Ordered    oseltamivir (TAMIFLU) 75 MG capsule  2 times daily     04/09/17 1956       Note:  This document was prepared using Dragon voice recognition  software and may include unintentional dictation errors.    Rudene Re, MD 04/09/17 [phone]

## 2017-04-09 NOTE — ED Triage Notes (Addendum)
Pt to ED from [facility] after staff reported a decreased LOC, decrease PO intake and fever. Pt has hx of diverticulitis last week and is currently on Doxycyline. EMS reports hx of dementia but staff reportedly gave different stories as to pt normal baseline. Pt also reporting a headache that began today. No obvious neuro abnormalities at this time.

## 2017-04-10 DIAGNOSIS — M6281 Muscle weakness (generalized): Secondary | ICD-10-CM | POA: Diagnosis not present

## 2017-04-10 DIAGNOSIS — Z7982 Long term (current) use of aspirin: Secondary | ICD-10-CM | POA: Diagnosis not present

## 2017-04-10 DIAGNOSIS — M549 Dorsalgia, unspecified: Secondary | ICD-10-CM | POA: Diagnosis not present

## 2017-04-10 DIAGNOSIS — K219 Gastro-esophageal reflux disease without esophagitis: Secondary | ICD-10-CM | POA: Diagnosis not present

## 2017-04-10 DIAGNOSIS — F0280 Dementia in other diseases classified elsewhere without behavioral disturbance: Secondary | ICD-10-CM | POA: Diagnosis not present

## 2017-04-10 DIAGNOSIS — D126 Benign neoplasm of colon, unspecified: Secondary | ICD-10-CM | POA: Diagnosis not present

## 2017-04-10 DIAGNOSIS — D509 Iron deficiency anemia, unspecified: Secondary | ICD-10-CM | POA: Diagnosis not present

## 2017-04-10 DIAGNOSIS — G8929 Other chronic pain: Secondary | ICD-10-CM | POA: Diagnosis not present

## 2017-04-10 DIAGNOSIS — G309 Alzheimer's disease, unspecified: Secondary | ICD-10-CM | POA: Diagnosis not present

## 2017-04-10 LAB — BLOOD CULTURE ID PANEL (REFLEXED)
Acinetobacter baumannii: NOT DETECTED
CANDIDA GLABRATA: NOT DETECTED
Candida albicans: NOT DETECTED
Candida krusei: NOT DETECTED
Candida parapsilosis: NOT DETECTED
Candida tropicalis: NOT DETECTED
ENTEROBACTER CLOACAE COMPLEX: NOT DETECTED
ENTEROBACTERIACEAE SPECIES: NOT DETECTED
Enterococcus species: NOT DETECTED
Escherichia coli: NOT DETECTED
HAEMOPHILUS INFLUENZAE: NOT DETECTED
Klebsiella oxytoca: NOT DETECTED
Klebsiella pneumoniae: NOT DETECTED
LISTERIA MONOCYTOGENES: NOT DETECTED
METHICILLIN RESISTANCE: DETECTED — AB
NEISSERIA MENINGITIDIS: NOT DETECTED
PROTEUS SPECIES: NOT DETECTED
Pseudomonas aeruginosa: NOT DETECTED
STAPHYLOCOCCUS SPECIES: DETECTED — AB
STREPTOCOCCUS SPECIES: NOT DETECTED
Serratia marcescens: NOT DETECTED
Staphylococcus aureus (BCID): NOT DETECTED
Streptococcus agalactiae: NOT DETECTED
Streptococcus pneumoniae: NOT DETECTED
Streptococcus pyogenes: NOT DETECTED

## 2017-04-10 NOTE — Progress Notes (Signed)
PHARMACY - PHYSICIAN COMMUNICATION CRITICAL VALUE ALERT - BLOOD CULTURE IDENTIFICATION (BCID)  Terina Mcelhinny is an 67 y.o. female who presented to [facility] on 04/09/2017 with a chief complaint of   Assessment:  1/4 Staph spp mec A(+) (include suspected source if known)  Name of physician (or Provider) Contacted: ED charge RN  Current antibiotics: d/c from ED  Changes to prescribed antibiotics recommended:  TBD  Results for orders placed or performed during the hospital encounter of 04/09/17  Blood Culture ID Panel (Reflexed) (Collected: 04/09/2017  4:16 PM)  Result Value Ref Range   Enterococcus species NOT DETECTED NOT DETECTED   Listeria monocytogenes NOT DETECTED NOT DETECTED   Staphylococcus species DETECTED (A) NOT DETECTED   Staphylococcus aureus NOT DETECTED NOT DETECTED   Methicillin resistance DETECTED (A) NOT DETECTED   Streptococcus species NOT DETECTED NOT DETECTED   Streptococcus agalactiae NOT DETECTED NOT DETECTED   Streptococcus pneumoniae NOT DETECTED NOT DETECTED   Streptococcus pyogenes NOT DETECTED NOT DETECTED   Acinetobacter baumannii NOT DETECTED NOT DETECTED   Enterobacteriaceae species NOT DETECTED NOT DETECTED   Enterobacter cloacae complex NOT DETECTED NOT DETECTED   Escherichia coli NOT DETECTED NOT DETECTED   Klebsiella oxytoca NOT DETECTED NOT DETECTED   Klebsiella pneumoniae NOT DETECTED NOT DETECTED   Proteus species NOT DETECTED NOT DETECTED   Serratia marcescens NOT DETECTED NOT DETECTED   Haemophilus influenzae NOT DETECTED NOT DETECTED   Neisseria meningitidis NOT DETECTED NOT DETECTED   Pseudomonas aeruginosa NOT DETECTED NOT DETECTED   Candida albicans NOT DETECTED NOT DETECTED   Candida glabrata NOT DETECTED NOT DETECTED   Candida krusei NOT DETECTED NOT DETECTED   Candida parapsilosis NOT DETECTED NOT DETECTED   Candida tropicalis NOT DETECTED NOT DETECTED    Nevae Pinnix S 04/10/2017  11:21 PM

## 2017-04-11 NOTE — ED Notes (Addendum)
Lab called with positive blood culture results which was reviewed with Dr. Dahlia Client. EDP instructed to this RN to call [facility] where patient resides and check on progress of patient. Per T[place] (med tech) pt has been improving with no complaints at this time. Per Dr. Dahlia Client facility was instructed to call patients PMD in am and notify of abnormal blood culture for possible recollection. 04/11/17 0008

## 2017-04-12 ENCOUNTER — Other Ambulatory Visit: Payer: Self-pay | Admitting: *Deleted

## 2017-04-12 NOTE — Patient Outreach (Signed)
[facility]) Care Management  04/12/2017  Jamie Boyd [birth year] [phone]   RN Health Coach telephone call to [facility]..  [employer] compliance verified. This patient is in there. She has no needs at this time. The facility has inhouse services for their patient. Patient has alzheimer's.  Plan case closure CMA made aware Consumer assessed and not further intervention needed  [place] [facility] [phone]

## 2017-04-13 LAB — CULTURE, BLOOD (ROUTINE X 2): SPECIAL REQUESTS: ADEQUATE

## 2017-04-14 LAB — CULTURE, BLOOD (ROUTINE X 2)
Culture: NO GROWTH
SPECIAL REQUESTS: ADEQUATE

## 2017-04-15 ENCOUNTER — Emergency Department: Payer: Medicare HMO

## 2017-04-15 ENCOUNTER — Encounter: Payer: Self-pay | Admitting: Emergency Medicine

## 2017-04-15 ENCOUNTER — Other Ambulatory Visit: Payer: Self-pay

## 2017-04-15 ENCOUNTER — Emergency Department
Admission: EM | Admit: 2017-04-15 | Discharge: 2017-04-16 | Disposition: A | Discharge status: Home / Self Care | Payer: Medicare HMO | Attending: Emergency Medicine | Admitting: Emergency Medicine

## 2017-04-15 DIAGNOSIS — Z8673 Personal history of transient ischemic attack (TIA), and cerebral infarction without residual deficits: Secondary | ICD-10-CM | POA: Insufficient documentation

## 2017-04-15 DIAGNOSIS — R4182 Altered mental status, unspecified: Secondary | ICD-10-CM | POA: Diagnosis present

## 2017-04-15 DIAGNOSIS — F0280 Dementia in other diseases classified elsewhere without behavioral disturbance: Secondary | ICD-10-CM | POA: Diagnosis not present

## 2017-04-15 DIAGNOSIS — R4020 Unspecified coma: Secondary | ICD-10-CM | POA: Diagnosis not present

## 2017-04-15 DIAGNOSIS — Z87891 Personal history of nicotine dependence: Secondary | ICD-10-CM | POA: Insufficient documentation

## 2017-04-15 DIAGNOSIS — F919 Conduct disorder, unspecified: Secondary | ICD-10-CM | POA: Diagnosis not present

## 2017-04-15 DIAGNOSIS — J9811 Atelectasis: Secondary | ICD-10-CM | POA: Diagnosis not present

## 2017-04-15 DIAGNOSIS — R4689 Other symptoms and signs involving appearance and behavior: Secondary | ICD-10-CM

## 2017-04-15 DIAGNOSIS — R462 Strange and inexplicable behavior: Secondary | ICD-10-CM | POA: Diagnosis not present

## 2017-04-15 DIAGNOSIS — G309 Alzheimer's disease, unspecified: Secondary | ICD-10-CM | POA: Diagnosis not present

## 2017-04-15 HISTORY — DX: Urinary tract infection, site not specified: N39.0

## 2017-04-15 HISTORY — DX: Alzheimer's disease, unspecified: G30.9

## 2017-04-15 HISTORY — DX: Dementia in other diseases classified elsewhere, unspecified severity, without behavioral disturbance, psychotic disturbance, mood disturbance, and anxiety: F02.80

## 2017-04-15 MED ORDER — SODIUM CHLORIDE 0.9 % IV BOLUS (SEPSIS)
500.0000 mL | Freq: Once | INTRAVENOUS | Status: AC
  Administered 2017-04-16: 500 mL via INTRAVENOUS

## 2017-04-15 NOTE — ED Provider Notes (Signed)
[facility] Emergency Department Provider Note   ____________________________________________   First MD Initiated Contact with Patient 04/15/17 2312     (approximate)  I have reviewed the triage vital signs and the nursing notes.   HISTORY  Chief Complaint Altered Mental Status  Patient with a history of dementia and not answering questions.  HPI Jamie Boyd is a 67 y.o. female with a history of dementia who comes into the hospital today as she has not been acting right.  The patient lives at the [facility] and the family states that he is not sure exactly why they brought her.  They state that she has not been responding to them as normal and she is not her happy go lucky self.  She has not been eating much but he reports that they did lose one set of her teeth so he is not sure if that could be why she is not eating.  The patient has been recently diagnosed with pneumonia and flu but he is unsure if she had any fevers.  He states that he is assuming that is why they have brought her here.  He does not know any other specifics as to why she is here.  The patient does not verbalize why she is here and does not answer any questions.  Past Medical History:  Diagnosis Date  . Alzheimer disease   . Anemia   . Dementia   . Goiter   . Memory deficit   . Urinary tract infection, site not specified     Patient Active Problem List   Diagnosis Date Noted  . CVA (cerebral vascular accident) ([place]) 01/24/2017  . Dehydration 01/20/2017  . Failure to thrive in adult 01/20/2017  . Hypotension 01/20/2017  . Agitation 11/23/2015  . Moderate dementia, with behavioral disturbance 11/23/2015  . Gastroesophageal reflux disease without esophagitis 09/11/2015  . Iron deficiency anemia due to chronic blood loss 06/02/2015  . GERD without esophagitis 06/01/2015  . Difficulty sleeping 04/27/2015  . Moderate dementia without behavioral disturbance 03/01/2015  . Acute  UTI 01/29/2015  . Anemia 01/29/2015  . Memory deficit 01/29/2015    Past Surgical History:  Procedure Laterality Date  . COLONOSCOPY WITH PROPOFOL N/A 02/18/2015   Procedure: COLONOSCOPY WITH PROPOFOL;  Surgeon: Hulen Luster, MD;  Location: [facility] ENDOSCOPY;  Service: Gastroenterology;  Laterality: N/A;  . ESOPHAGOGASTRODUODENOSCOPY N/A 02/18/2015   Procedure: ESOPHAGOGASTRODUODENOSCOPY (EGD);  Surgeon: Hulen Luster, MD;  Location: [facility] ENDOSCOPY;  Service: Gastroenterology;  Laterality: N/A;    Prior to Admission medications   Medication Sig Start Date End Date Taking? Authorizing Provider  azelastine (ASTELIN) 0.1 % nasal spray Place 1 spray into both nostrils 2 (two) times daily. Use in each nostril as directed   Yes [provider]  memantine (NAMENDA) 10 MG tablet Take 10 mg by mouth 2 (two) times daily.  11/23/15 11/27/17 Yes [provider]  mirabegron ER (MYRBETRIQ) 50 MG TB24 tablet Take 1 tablet (50 mg total) by mouth daily. 03/19/17  Yes McGowan, Larene Beach A, PA-C    Allergies Pollen extract  Family History  Problem Relation Age of Onset  . Diabetes Maternal Uncle   . Prostate cancer Neg Hx   . Bladder Cancer Neg Hx   . Kidney cancer Neg Hx     Social History Social History   Tobacco Use  . Smoking status: Former Smoker    Types: Cigarettes    Last attempt to quit: 01/25/2014  Years since quitting: 3.2  . Smokeless tobacco: Never Used  Substance Use Topics  . Alcohol use: No  . Drug use: No    Review of Systems  Unable to complete a review of systems due to patient's inability to answer questions and her dementia.  ____________________________________________   PHYSICAL EXAM:  VITAL SIGNS: ED Triage Vitals  Enc Vitals Group     BP 04/15/17 2225 116/73     Pulse Rate 04/15/17 2225 72     Resp 04/15/17 2225 16     Temp 04/15/17 2225 99.1 F (37.3 C)     Temp Source 04/15/17 2225 Oral     SpO2 04/15/17 2221 97 %     Weight 04/15/17 2228  145 lb (65.8 kg)     Height 04/15/17 2228 5\' 3"  (1.6 m)     Head Circumference --      Peak Flow --      Pain Score --      Pain Loc --      Pain Edu? --      Excl. in [place]? --     Constitutional: Patient is alert but not oriented.  She is not answering questions.  She is in no significant distress at this time Eyes: Conjunctivae are normal. PERRL. EOMI. Head: Atraumatic. Nose: No congestion/rhinnorhea. Mouth/Throat: Mucous membranes are dry.  Oropharynx non-erythematous. Cardiovascular: Normal rate, regular rhythm. Grossly normal heart sounds.  Good peripheral circulation. Respiratory: Normal respiratory effort.  No retractions. Lungs CTAB. Gastrointestinal: Soft and nontender. No distention.  Positive bowel sounds Musculoskeletal: No lower extremity tenderness nor edema.   Neurologic: The patient is able to follow some commands.  She does open her mouth smile and squeeze my fingers with equal grip strength.  The patient though does not answer any questions, which is abnormal for the patient according to the family. Skin:  Skin is warm, dry and intact.  Psychiatric: With some flat affect and not responding  ____________________________________________   LABS (all labs ordered are listed, but only abnormal results are displayed)  Labs Reviewed  COMPREHENSIVE METABOLIC PANEL - Abnormal; Notable for the following components:      Result Value   Sodium 148 (*)    Albumin 3.3 (*)    AST 46 (*)    All other components within normal limits  CBC - Abnormal; Notable for the following components:   RDW 15.5 (*)    All other components within normal limits  URINALYSIS, COMPLETE (UACMP) WITH MICROSCOPIC - Abnormal; Notable for the following components:   Color, Urine YELLOW (*)    APPearance CLEAR (*)    Ketones, ur 20 (*)    Squamous Epithelial / LPF 0-5 (*)    All other components within normal limits  TROPONIN I  CBG MONITORING, ED    ____________________________________________  EKG  ED ECG REPORT I, Loney Hering, the attending physician, personally viewed and interpreted this ECG.   Date: 04/15/2017  EKG Time: 2227  Rate: 73  Rhythm: normal sinus rhythm  Axis: normal  Intervals:none  ST&T Change: none  ____________________________________________  RADIOLOGY  ED MD interpretation:    CT head: No acute intracranial pathology seen on CT  CXR: NAD  Official radiology report(s): Dg Chest 2 View  Result Date: 04/15/2017 CLINICAL DATA:  Acute onset of altered mental status. EXAM: CHEST - 2 VIEW COMPARISON:  Chest radiograph performed 04/09/2017 FINDINGS: The lungs are well-aerated. Minimal left basilar atelectasis is noted. There is no evidence of pleural  effusion or pneumothorax. The heart is normal in size; the mediastinal contour is within normal limits. No acute osseous abnormalities are seen. IMPRESSION: Minimal left basilar atelectasis noted.  Lungs otherwise clear. Electronically Signed   By: Garald Balding M.D.   On: 04/15/2017 23:44   Ct Head Wo Contrast  Result Date: 04/15/2017 CLINICAL DATA:  Acute onset of altered level of consciousness. EXAM: CT HEAD WITHOUT CONTRAST TECHNIQUE: Contiguous axial images were obtained from the base of the skull through the vertex without intravenous contrast. COMPARISON:  CT of the head and MRI of the brain performed 01/24/2017 FINDINGS: Brain: No evidence of acute infarction, hemorrhage, hydrocephalus, extra-axial collection or mass lesion / mass effect. Prominence of the ventricles and sulci reflects moderate cortical volume loss. Cerebellar atrophy is noted. Diffuse periventricular and subcortical white matter change likely reflects small vessel ischemic microangiopathy. The brainstem and fourth ventricle are within normal limits. The basal ganglia are unremarkable in appearance. The cerebral hemispheres demonstrate grossly normal gray-white differentiation. No  mass effect or midline shift is seen. Vascular: No hyperdense vessel or unexpected calcification. Skull: There is no evidence of fracture; visualized osseous structures are unremarkable in appearance. Sinuses/Orbits: The visualized portions of the orbits are within normal limits. The paranasal sinuses and mastoid air cells are well-aerated. Other: No significant soft tissue abnormalities are seen. IMPRESSION: 1. No acute intracranial pathology seen on CT. 2. Moderate cortical volume loss and diffuse small vessel ischemic microangiopathy. Electronically Signed   By: Garald Balding M.D.   On: 04/15/2017 23:43    ____________________________________________   PROCEDURES  Procedure(s) performed: None  Procedures  Critical Care performed: No  ____________________________________________   INITIAL IMPRESSION / ASSESSMENT AND PLAN / ED COURSE  As part of my medical decision making, I reviewed the following data within the electronic MEDICAL RECORD NUMBER Notes from prior ED visits and [place] Controlled Substance Database   This is a 67 year old female who comes into the hospital today not acting right.  The family could not give me much information and the method for the patient.  Differential diagnosis includes urinary tract infection, pneumonia, electrolyte abnormality  I will check a CBC, CMP, troponin, urinalysis, chest x-ray, CT head.  I will give the patient a bolus of normal saline 500 mL's and she will be reassessed once I received some of her results.  The patient's imaging studies returned unremarkable.  The patient's blood work is also unremarkable. She will be discharged to follow up with her primary care physician.  The patient is not in any acute distress and looking back at the nurse's notes she is been having some change in her mental status for the past week.  There is a possibility that this could be a progression of her dementia but there does not appear to be any infectious or  organic cause to her change in behavior.  The patient does still respond and does need to increase her p.o. intake.  We will contact her legal guardian and she will be discharged back to the nursing home.      ____________________________________________   FINAL CLINICAL IMPRESSION(S) / ED DIAGNOSES  Final diagnoses:  Alzheimer's dementia without behavioral disturbance, unspecified timing of dementia onset  Abnormal behavior     ED Discharge Orders    None       Note:  This document was prepared using Dragon voice recognition software and may include unintentional dictation errors.    Loney Hering, MD 04/16/17 [phone]

## 2017-04-15 NOTE — ED Notes (Signed)
Family at bedside. 

## 2017-04-15 NOTE — ED Triage Notes (Signed)
Per EMS Pt to ED from [facility] after staff reports "not acting right since Monday" recent dx of influenza.  Family reports stroke hx but not known to staff at facility.   Hx of [place], UTIs  Pt denies pain or problem.

## 2017-04-16 DIAGNOSIS — G309 Alzheimer's disease, unspecified: Secondary | ICD-10-CM | POA: Diagnosis not present

## 2017-04-16 DIAGNOSIS — G308 Other Alzheimer's disease: Secondary | ICD-10-CM | POA: Diagnosis not present

## 2017-04-16 LAB — COMPREHENSIVE METABOLIC PANEL
ALT: 38 U/L (ref 14–54)
ANION GAP: 11 (ref 5–15)
AST: 46 U/L — ABNORMAL HIGH (ref 15–41)
Albumin: 3.3 g/dL — ABNORMAL LOW (ref 3.5–5.0)
Alkaline Phosphatase: 60 U/L (ref 38–126)
BUN: 19 mg/dL (ref 6–20)
CALCIUM: 9.4 mg/dL (ref 8.9–10.3)
CO2: 28 mmol/L (ref 22–32)
Chloride: 109 mmol/L (ref 101–111)
Creatinine, Ser: 0.77 mg/dL (ref 0.44–1.00)
Glucose, Bld: 85 mg/dL (ref 65–99)
POTASSIUM: 4.5 mmol/L (ref 3.5–5.1)
Sodium: 148 mmol/L — ABNORMAL HIGH (ref 135–145)
Total Bilirubin: 0.8 mg/dL (ref 0.3–1.2)
Total Protein: 7.9 g/dL (ref 6.5–8.1)

## 2017-04-16 LAB — CBC
HEMATOCRIT: 37.2 % (ref 35.0–47.0)
HEMOGLOBIN: 12.1 g/dL (ref 12.0–16.0)
MCH: 29.9 pg (ref 26.0–34.0)
MCHC: 32.6 g/dL (ref 32.0–36.0)
MCV: 91.5 fL (ref 80.0–100.0)
Platelets: 214 10*3/uL (ref 150–440)
RBC: 4.07 MIL/uL (ref 3.80–5.20)
RDW: 15.5 % — ABNORMAL HIGH (ref 11.5–14.5)
WBC: 6.1 10*3/uL (ref 3.6–11.0)

## 2017-04-16 LAB — URINALYSIS, COMPLETE (UACMP) WITH MICROSCOPIC
BACTERIA UA: NONE SEEN
Bilirubin Urine: NEGATIVE
GLUCOSE, UA: NEGATIVE mg/dL
Hgb urine dipstick: NEGATIVE
KETONES UR: 20 mg/dL — AB
LEUKOCYTES UA: NEGATIVE
Nitrite: NEGATIVE
PROTEIN: NEGATIVE mg/dL
Specific Gravity, Urine: 1.016 (ref 1.005–1.030)
pH: 5 (ref 5.0–8.0)

## 2017-04-16 LAB — TROPONIN I

## 2017-04-16 NOTE — Discharge Instructions (Signed)
Please have patient follow up with her primary care physician. Our evaluation in the Emergency Department at this time is negative. There does not appear to be an infectious cause to her symptoms. Please have her return with any worsening condition or any other concerns.

## 2017-04-16 NOTE — ED Notes (Signed)
Pt given water and applesauce  

## 2017-04-16 NOTE — ED Notes (Addendum)
Brother, [name], [phone], going home  This person is the pt's legal guardian and reports that the pt is at baseline but eating less but that maybe because somebody threw away the pt's partials   [place]

## 2017-04-16 NOTE — ED Notes (Addendum)
Contact with legal guardian, [name], reported pt ready for discharge appears to be at baseline, guardian requests ED transport pt back to facility

## 2017-04-17 DIAGNOSIS — D509 Iron deficiency anemia, unspecified: Secondary | ICD-10-CM | POA: Diagnosis not present

## 2017-04-17 DIAGNOSIS — F0280 Dementia in other diseases classified elsewhere without behavioral disturbance: Secondary | ICD-10-CM | POA: Diagnosis not present

## 2017-04-17 DIAGNOSIS — G8929 Other chronic pain: Secondary | ICD-10-CM | POA: Diagnosis not present

## 2017-04-17 DIAGNOSIS — K219 Gastro-esophageal reflux disease without esophagitis: Secondary | ICD-10-CM | POA: Diagnosis not present

## 2017-04-17 DIAGNOSIS — D126 Benign neoplasm of colon, unspecified: Secondary | ICD-10-CM | POA: Diagnosis not present

## 2017-04-17 DIAGNOSIS — M6281 Muscle weakness (generalized): Secondary | ICD-10-CM | POA: Diagnosis not present

## 2017-04-17 DIAGNOSIS — Z7982 Long term (current) use of aspirin: Secondary | ICD-10-CM | POA: Diagnosis not present

## 2017-04-17 DIAGNOSIS — G308 Other Alzheimer's disease: Secondary | ICD-10-CM | POA: Diagnosis not present

## 2017-04-17 DIAGNOSIS — G309 Alzheimer's disease, unspecified: Secondary | ICD-10-CM | POA: Diagnosis not present

## 2017-04-17 DIAGNOSIS — M549 Dorsalgia, unspecified: Secondary | ICD-10-CM | POA: Diagnosis not present

## 2017-04-18 DIAGNOSIS — G308 Other Alzheimer's disease: Secondary | ICD-10-CM | POA: Diagnosis not present

## 2017-04-19 DIAGNOSIS — F0280 Dementia in other diseases classified elsewhere without behavioral disturbance: Secondary | ICD-10-CM | POA: Diagnosis not present

## 2017-04-19 DIAGNOSIS — D509 Iron deficiency anemia, unspecified: Secondary | ICD-10-CM | POA: Diagnosis not present

## 2017-04-19 DIAGNOSIS — G308 Other Alzheimer's disease: Secondary | ICD-10-CM | POA: Diagnosis not present

## 2017-04-19 DIAGNOSIS — M549 Dorsalgia, unspecified: Secondary | ICD-10-CM | POA: Diagnosis not present

## 2017-04-19 DIAGNOSIS — M6281 Muscle weakness (generalized): Secondary | ICD-10-CM | POA: Diagnosis not present

## 2017-04-19 DIAGNOSIS — G8929 Other chronic pain: Secondary | ICD-10-CM | POA: Diagnosis not present

## 2017-04-19 DIAGNOSIS — D126 Benign neoplasm of colon, unspecified: Secondary | ICD-10-CM | POA: Diagnosis not present

## 2017-04-19 DIAGNOSIS — K219 Gastro-esophageal reflux disease without esophagitis: Secondary | ICD-10-CM | POA: Diagnosis not present

## 2017-04-19 DIAGNOSIS — G309 Alzheimer's disease, unspecified: Secondary | ICD-10-CM | POA: Diagnosis not present

## 2017-04-19 DIAGNOSIS — Z7982 Long term (current) use of aspirin: Secondary | ICD-10-CM | POA: Diagnosis not present

## 2017-04-20 ENCOUNTER — Emergency Department
Admission: EM | Admit: 2017-04-20 | Discharge: 2017-04-20 | Disposition: A | Discharge status: Home / Self Care | Payer: Medicare HMO | Attending: Emergency Medicine | Admitting: Emergency Medicine

## 2017-04-20 ENCOUNTER — Encounter: Payer: Self-pay | Admitting: Emergency Medicine

## 2017-04-20 DIAGNOSIS — Z87891 Personal history of nicotine dependence: Secondary | ICD-10-CM | POA: Diagnosis not present

## 2017-04-20 DIAGNOSIS — Y92129 Unspecified place in nursing home as the place of occurrence of the external cause: Secondary | ICD-10-CM | POA: Insufficient documentation

## 2017-04-20 DIAGNOSIS — Z79899 Other long term (current) drug therapy: Secondary | ICD-10-CM | POA: Insufficient documentation

## 2017-04-20 DIAGNOSIS — Y999 Unspecified external cause status: Secondary | ICD-10-CM | POA: Diagnosis not present

## 2017-04-20 DIAGNOSIS — Z0389 Encounter for observation for other suspected diseases and conditions ruled out: Secondary | ICD-10-CM | POA: Insufficient documentation

## 2017-04-20 DIAGNOSIS — Z7401 Bed confinement status: Secondary | ICD-10-CM | POA: Diagnosis not present

## 2017-04-20 DIAGNOSIS — F0280 Dementia in other diseases classified elsewhere without behavioral disturbance: Secondary | ICD-10-CM | POA: Diagnosis not present

## 2017-04-20 DIAGNOSIS — Z0000 Encounter for general adult medical examination without abnormal findings: Secondary | ICD-10-CM | POA: Diagnosis not present

## 2017-04-20 DIAGNOSIS — G309 Alzheimer's disease, unspecified: Secondary | ICD-10-CM | POA: Diagnosis not present

## 2017-04-20 DIAGNOSIS — G308 Other Alzheimer's disease: Secondary | ICD-10-CM | POA: Diagnosis not present

## 2017-04-20 DIAGNOSIS — Y9389 Activity, other specified: Secondary | ICD-10-CM | POA: Diagnosis not present

## 2017-04-20 DIAGNOSIS — W19XXXA Unspecified fall, initial encounter: Secondary | ICD-10-CM

## 2017-04-20 DIAGNOSIS — F05 Delirium due to known physiological condition: Secondary | ICD-10-CM | POA: Diagnosis not present

## 2017-04-20 DIAGNOSIS — W1839XA Other fall on same level, initial encounter: Secondary | ICD-10-CM | POA: Diagnosis not present

## 2017-04-20 DIAGNOSIS — Z8673 Personal history of transient ischemic attack (TIA), and cerebral infarction without residual deficits: Secondary | ICD-10-CM | POA: Diagnosis not present

## 2017-04-20 DIAGNOSIS — R109 Unspecified abdominal pain: Secondary | ICD-10-CM | POA: Diagnosis not present

## 2017-04-20 DIAGNOSIS — R52 Pain, unspecified: Secondary | ICD-10-CM | POA: Diagnosis not present

## 2017-04-20 DIAGNOSIS — Z008 Encounter for other general examination: Secondary | ICD-10-CM | POA: Diagnosis present

## 2017-04-20 NOTE — ED Triage Notes (Signed)
Pt. Fell on bottom while trying to sit in wheelchair, staff and patient deny LOC.

## 2017-04-20 NOTE — ED Notes (Signed)
Called brother Jamie Boyd(legeal guardian([phone].  Jamie Boyd is good with doctor dx and good with patient going back to facility.

## 2017-04-20 NOTE — ED Provider Notes (Signed)
[facility] Emergency Department Provider Note   ____________________________________________   First MD Initiated Contact with Patient 04/20/17 2007     (approximate)  I have reviewed the triage vital signs and the nursing notes.   HISTORY  Chief Complaint No chief complaint on file.  Chief complaint is fall  HPI Jamie Boyd is a 67 y.o. female patient comes from a nursing home apparently she was Sitting down on her wheelchair and missed it and fell and landed on the floor. She is reported to have hit her left side. She did not hit her head reportedly. Patient has no complaints of any pain anywhere. On palpation of her head neck chest or belly hips and legs and arms nothing hurts. Same with palpation of the back.EMS had reported she complained of belly pain to them but she denies belly pain to me repeatedly and again has no pain on palpation entire abdomen   Past Medical History:  Diagnosis Date  . Alzheimer disease   . Anemia   . Dementia   . Goiter   . Memory deficit   . Urinary tract infection, site not specified     Patient Active Problem List   Diagnosis Date Noted  . CVA (cerebral vascular accident) ([place]) 01/24/2017  . Dehydration 01/20/2017  . Failure to thrive in adult 01/20/2017  . Hypotension 01/20/2017  . Agitation 11/23/2015  . Moderate dementia, with behavioral disturbance 11/23/2015  . Gastroesophageal reflux disease without esophagitis 09/11/2015  . Iron deficiency anemia due to chronic blood loss 06/02/2015  . GERD without esophagitis 06/01/2015  . Difficulty sleeping 04/27/2015  . Moderate dementia without behavioral disturbance 03/01/2015  . Acute UTI 01/29/2015  . Anemia 01/29/2015  . Memory deficit 01/29/2015    Past Surgical History:  Procedure Laterality Date  . COLONOSCOPY WITH PROPOFOL N/A 02/18/2015   Procedure: COLONOSCOPY WITH PROPOFOL;  Surgeon: Hulen Luster, MD;  Location: [facility] ENDOSCOPY;  Service:  Gastroenterology;  Laterality: N/A;  . ESOPHAGOGASTRODUODENOSCOPY N/A 02/18/2015   Procedure: ESOPHAGOGASTRODUODENOSCOPY (EGD);  Surgeon: Hulen Luster, MD;  Location: [facility] ENDOSCOPY;  Service: Gastroenterology;  Laterality: N/A;    Prior to Admission medications   Medication Sig Start Date End Date Taking? Authorizing Provider  azelastine (ASTELIN) 0.1 % nasal spray Place 1 spray into both nostrils 2 (two) times daily. Use in each nostril as directed    [provider]  memantine (NAMENDA) 10 MG tablet Take 10 mg by mouth 2 (two) times daily.  11/23/15 11/27/17  [provider]  mirabegron ER (MYRBETRIQ) 50 MG TB24 tablet Take 1 tablet (50 mg total) by mouth daily. 03/19/17   Zara Council A, PA-C    Allergies Pollen extract  Family History  Problem Relation Age of Onset  . Diabetes Maternal Uncle   . Prostate cancer Neg Hx   . Bladder Cancer Neg Hx   . Kidney cancer Neg Hx     Social History Social History   Tobacco Use  . Smoking status: Former Smoker    Types: Cigarettes    Last attempt to quit: 01/25/2014    Years since quitting: 3.2  . Smokeless tobacco: Never Used  Substance Use Topics  . Alcohol use: No  . Drug use: No    Review of Systems  Constitutional: No fever/chills Eyes: No visual changes. ENT: No sore throat. Cardiovascular: Denies chest pain. Respiratory: Denies shortness of breath. Gastrointestinal: No abdominal pain.  No nausea, no vomiting.  No diarrhea.  No constipation. Genitourinary:  Negative for dysuria. Musculoskeletal: Negative for back pain. Skin: Negative for rash. Neurological: Negative for headaches, focal weakness   ____________________________________________   PHYSICAL EXAM:  VITAL SIGNS: ED Triage Vitals  Enc Vitals Group     BP      Pulse      Resp      Temp      Temp src      SpO2      Weight      Height      Head Circumference      Peak Flow      Pain Score      Pain Loc      Pain Edu?       Excl. in [place]?     Constitutional: Alert and oriented. Well appearing and in no acute distress. Eyes: Conjunctivae are normal. Head: Atraumatic. Nose: No congestion/rhinnorhea. Mouth/Throat: Mucous membranes are moist.  Oropharynx non-erythematous. Neck: No stridor.  No cervical spine tenderness to palpation. Cardiovascular: Normal rate, regular rhythm. Grossly normal heart sounds.  Good peripheral circulation. Respiratory: Normal respiratory effort.  No retractions. Lungs CTAB. Gastrointestinal: Soft and nontender. No distention. No abdominal bruits. No CVA tenderness.  Musculoskeletal: No lower extremity tenderness nor edema.  Neurologic:  Normal speech and language. No gross focal neurologic deficits are appreciated.  Skin:  Skin is warm, dry and intact. No rash noted.   ____________________________________________   LABS (all labs ordered are listed, but only abnormal results are displayed)  Labs Reviewed - No data to display ____________________________________________  EKG   ____________________________________________  RADIOLOGY  ED MD interpretation:   Official radiology report(s): No results found.  ____________________________________________   PROCEDURES  Procedure(s) performed:   Procedures  Critical Care performed:   ____________________________________________   INITIAL IMPRESSION / ASSESSMENT AND PLAN / ED COURSE           ____________________________________________   FINAL CLINICAL IMPRESSION(S) / ED DIAGNOSES  Final diagnoses:  Fall, initial encounter     ED Discharge Orders    None       Note:  This document was prepared using Dragon voice recognition software and may include unintentional dictation errors.    Nena Polio, MD 04/20/17 2011

## 2017-04-20 NOTE — Discharge Instructions (Signed)
please return if any pain develops or any other problems occur.

## 2017-04-21 DIAGNOSIS — G308 Other Alzheimer's disease: Secondary | ICD-10-CM | POA: Diagnosis not present

## 2017-04-22 DIAGNOSIS — G308 Other Alzheimer's disease: Secondary | ICD-10-CM | POA: Diagnosis not present

## 2017-04-23 DIAGNOSIS — G308 Other Alzheimer's disease: Secondary | ICD-10-CM | POA: Diagnosis not present

## 2017-04-24 ENCOUNTER — Emergency Department: Payer: Medicare HMO

## 2017-04-24 ENCOUNTER — Emergency Department
Admission: EM | Admit: 2017-04-24 | Discharge: 2017-04-24 | Disposition: A | Discharge status: Home / Self Care | Payer: Medicare HMO | Attending: Student in an Organized Health Care Education/Training Program | Admitting: Student in an Organized Health Care Education/Training Program

## 2017-04-24 ENCOUNTER — Other Ambulatory Visit: Payer: Self-pay

## 2017-04-24 DIAGNOSIS — M25552 Pain in left hip: Secondary | ICD-10-CM | POA: Diagnosis not present

## 2017-04-24 DIAGNOSIS — M549 Dorsalgia, unspecified: Secondary | ICD-10-CM | POA: Diagnosis not present

## 2017-04-24 DIAGNOSIS — I672 Cerebral atherosclerosis: Secondary | ICD-10-CM | POA: Diagnosis not present

## 2017-04-24 DIAGNOSIS — D649 Anemia, unspecified: Secondary | ICD-10-CM | POA: Diagnosis not present

## 2017-04-24 DIAGNOSIS — Z79899 Other long term (current) drug therapy: Secondary | ICD-10-CM | POA: Diagnosis not present

## 2017-04-24 DIAGNOSIS — G8929 Other chronic pain: Secondary | ICD-10-CM | POA: Diagnosis not present

## 2017-04-24 DIAGNOSIS — M6281 Muscle weakness (generalized): Secondary | ICD-10-CM | POA: Diagnosis not present

## 2017-04-24 DIAGNOSIS — Z87891 Personal history of nicotine dependence: Secondary | ICD-10-CM | POA: Diagnosis not present

## 2017-04-24 DIAGNOSIS — F0280 Dementia in other diseases classified elsewhere without behavioral disturbance: Secondary | ICD-10-CM | POA: Insufficient documentation

## 2017-04-24 DIAGNOSIS — Z8673 Personal history of transient ischemic attack (TIA), and cerebral infarction without residual deficits: Secondary | ICD-10-CM | POA: Diagnosis not present

## 2017-04-24 DIAGNOSIS — G309 Alzheimer's disease, unspecified: Secondary | ICD-10-CM | POA: Insufficient documentation

## 2017-04-24 DIAGNOSIS — I6522 Occlusion and stenosis of left carotid artery: Secondary | ICD-10-CM | POA: Diagnosis not present

## 2017-04-24 DIAGNOSIS — M545 Low back pain: Secondary | ICD-10-CM | POA: Diagnosis not present

## 2017-04-24 DIAGNOSIS — R4182 Altered mental status, unspecified: Secondary | ICD-10-CM | POA: Diagnosis not present

## 2017-04-24 DIAGNOSIS — N3000 Acute cystitis without hematuria: Secondary | ICD-10-CM | POA: Diagnosis not present

## 2017-04-24 DIAGNOSIS — Z743 Need for continuous supervision: Secondary | ICD-10-CM | POA: Diagnosis not present

## 2017-04-24 DIAGNOSIS — D126 Benign neoplasm of colon, unspecified: Secondary | ICD-10-CM | POA: Diagnosis not present

## 2017-04-24 DIAGNOSIS — Z7982 Long term (current) use of aspirin: Secondary | ICD-10-CM | POA: Diagnosis not present

## 2017-04-24 DIAGNOSIS — G308 Other Alzheimer's disease: Secondary | ICD-10-CM | POA: Diagnosis not present

## 2017-04-24 DIAGNOSIS — S79912A Unspecified injury of left hip, initial encounter: Secondary | ICD-10-CM | POA: Diagnosis not present

## 2017-04-24 DIAGNOSIS — D509 Iron deficiency anemia, unspecified: Secondary | ICD-10-CM | POA: Diagnosis not present

## 2017-04-24 DIAGNOSIS — R269 Unspecified abnormalities of gait and mobility: Secondary | ICD-10-CM | POA: Diagnosis not present

## 2017-04-24 DIAGNOSIS — K219 Gastro-esophageal reflux disease without esophagitis: Secondary | ICD-10-CM | POA: Diagnosis not present

## 2017-04-24 NOTE — ED Notes (Signed)
Legal guardian [name] at bedside. MD Quentin Cornwall updated Mr. Jamie Boyd on pt plan of care.

## 2017-04-24 NOTE — ED Notes (Addendum)
Legal guardian Jamie Boyd notified of pt's test results and discharge. Esign not working, Discharge instructions reviewed and no questions at this time.

## 2017-04-24 NOTE — ED Notes (Signed)
Patient transported to X-ray 

## 2017-04-24 NOTE — ED Notes (Signed)
[facility] and reported off to nurse that patient had xrays and they are negative. Pt to arrive back once EMS arrives

## 2017-04-24 NOTE — ED Provider Notes (Signed)
[facility] Emergency Department Provider Note    None    (approximate)  I have reviewed the triage vital signs and the nursing notes.   HISTORY  Chief Complaint Fall  Level V Caveat: dementia  HPI Jamie Boyd is a 67 y.o. female w/ History dementia presents from [facility] for evaluation of left hip pain after fall 4 days ago at which point she was evaluated and Discharge back to facility.  Facility states the patient was complaining of left hip pain today.  No report of interval fall.  Patient denies any pain but is an unreliable historian.     Past Medical History:  Diagnosis Date  . Alzheimer disease   . Anemia   . Dementia   . Goiter   . Memory deficit   . Urinary tract infection, site not specified    Family History  Problem Relation Age of Onset  . Diabetes Maternal Uncle   . Prostate cancer Neg Hx   . Bladder Cancer Neg Hx   . Kidney cancer Neg Hx    Past Surgical History:  Procedure Laterality Date  . COLONOSCOPY WITH PROPOFOL N/A 02/18/2015   Procedure: COLONOSCOPY WITH PROPOFOL;  Surgeon: Hulen Luster, MD;  Location: [facility] ENDOSCOPY;  Service: Gastroenterology;  Laterality: N/A;  . ESOPHAGOGASTRODUODENOSCOPY N/A 02/18/2015   Procedure: ESOPHAGOGASTRODUODENOSCOPY (EGD);  Surgeon: Hulen Luster, MD;  Location: [facility] ENDOSCOPY;  Service: Gastroenterology;  Laterality: N/A;   Patient Active Problem List   Diagnosis Date Noted  . CVA (cerebral vascular accident) ([place]) 01/24/2017  . Dehydration 01/20/2017  . Failure to thrive in adult 01/20/2017  . Hypotension 01/20/2017  . Agitation 11/23/2015  . Moderate dementia, with behavioral disturbance 11/23/2015  . Gastroesophageal reflux disease without esophagitis 09/11/2015  . Iron deficiency anemia due to chronic blood loss 06/02/2015  . GERD without esophagitis 06/01/2015  . Difficulty sleeping 04/27/2015  . Moderate dementia without behavioral disturbance 03/01/2015  . Acute UTI 01/29/2015   . Anemia 01/29/2015  . Memory deficit 01/29/2015      Prior to Admission medications   Medication Sig Start Date End Date Taking? Authorizing Provider  azelastine (ASTELIN) 0.1 % nasal spray Place 1 spray into both nostrils 2 (two) times daily. Use in each nostril as directed    [provider]  memantine (NAMENDA) 10 MG tablet Take 10 mg by mouth 2 (two) times daily.  11/23/15 11/27/17  [provider]  mirabegron ER (MYRBETRIQ) 50 MG TB24 tablet Take 1 tablet (50 mg total) by mouth daily. 03/19/17   Nori Riis, PA-C    Allergies Pollen extract    Social History Social History   Tobacco Use  . Smoking status: Former Smoker    Types: Cigarettes    Last attempt to quit: 01/25/2014    Years since quitting: 3.2  . Smokeless tobacco: Never Used  Substance Use Topics  . Alcohol use: No  . Drug use: No    Review of Systems Patient denies headaches, rhinorrhea, blurry vision, numbness, shortness of breath, chest pain, edema, cough, abdominal pain, nausea, vomiting, diarrhea, dysuria, fevers, rashes or hallucinations unless otherwise stated above in HPI. ____________________________________________   PHYSICAL EXAM:  VITAL SIGNS: Vitals:   04/24/17 1712  BP: 112/75  Pulse: 72  Resp: 18  Temp: 98.6 F (37 C)  SpO2: 96%    Constitutional: Alert in no acute distress. Eyes: Conjunctivae are normal.  Head: Atraumatic. Nose: No congestion/rhinnorhea. Mouth/Throat: Mucous membranes are moist.  Neck: No stridor. Painless ROM.  Cardiovascular: Normal rate, regular rhythm. Grossly normal heart sounds.  Good peripheral circulation. Respiratory: Normal respiratory effort.  No retractions. Lungs CTAB. Gastrointestinal: Soft and nontender. No distention. No abdominal bruits. No CVA tenderness. Genitourinary:  Musculoskeletal: No lower extremity tenderness nor edema.  No joint effusions. Neurologic:  Normal speech and language. No gross focal  neurologic deficits are appreciated. No facial droop Skin:  Skin is warm, dry and intact. No rash noted. Psychiatric: Mood and affect are normal. Speech and behavior are normal.  ____________________________________________   LABS (all labs ordered are listed, but only abnormal results are displayed)  No results found for this or any previous visit (from the past 24 hour(s)). ____________________________________________ ____________________________________________  OEUMPNTIR  I personally reviewed all radiographic images ordered to evaluate for the above acute complaints and reviewed radiology reports and findings.  These findings were personally discussed with the patient.  Please see medical record for radiology report.  ____________________________________________   PROCEDURES  Procedure(s) performed:  Procedures    Critical Care performed: no ____________________________________________   INITIAL IMPRESSION / ASSESSMENT AND PLAN / ED COURSE  Pertinent labs & imaging results that were available during my care of the patient were reviewed by me and considered in my medical decision making (see chart for details).  DDX: fracture, dislocation, contusion  Jamie Boyd is a 67 y.o. who presents to the ED with Report of left hip pain.  No evidence of fracture.  Patient stable and appropriate for discharge home.  Signed a medical power of attorney at bedside and agrees with no further diagnostic testing at this time she states that she otherwise appears at her baseline.      As part of my medical decision making, I reviewed the following data within the [facility] notes reviewed and incorporated, Labs reviewed, notes from prior ED visits.   ____________________________________________   FINAL CLINICAL IMPRESSION(S) / ED DIAGNOSES  Final diagnoses:  Hip pain, acute, left      NEW MEDICATIONS STARTED DURING THIS VISIT:  New Prescriptions     No medications on file     Note:  This document was prepared using Dragon voice recognition software and may include unintentional dictation errors.    Merlyn Lot, MD 04/24/17 [phone]

## 2017-04-24 NOTE — ED Triage Notes (Signed)
Pt comes via [facility] from [facility] with c/o fall 4 days ago. EMS states that facility would like pt to have xrays. Pt is alert. Pt denies any pain. MD at bedside

## 2017-04-24 NOTE — ED Notes (Signed)
Pt given meal tray.

## 2017-04-25 DIAGNOSIS — G308 Other Alzheimer's disease: Secondary | ICD-10-CM | POA: Diagnosis not present

## 2017-04-26 DIAGNOSIS — M6281 Muscle weakness (generalized): Secondary | ICD-10-CM | POA: Diagnosis not present

## 2017-04-26 DIAGNOSIS — R4182 Altered mental status, unspecified: Secondary | ICD-10-CM | POA: Diagnosis not present

## 2017-04-26 DIAGNOSIS — M549 Dorsalgia, unspecified: Secondary | ICD-10-CM | POA: Diagnosis not present

## 2017-04-26 DIAGNOSIS — K219 Gastro-esophageal reflux disease without esophagitis: Secondary | ICD-10-CM | POA: Diagnosis not present

## 2017-04-26 DIAGNOSIS — D126 Benign neoplasm of colon, unspecified: Secondary | ICD-10-CM | POA: Diagnosis not present

## 2017-04-26 DIAGNOSIS — G301 Alzheimer's disease with late onset: Secondary | ICD-10-CM | POA: Diagnosis not present

## 2017-04-26 DIAGNOSIS — M545 Low back pain: Secondary | ICD-10-CM | POA: Diagnosis not present

## 2017-04-26 DIAGNOSIS — G309 Alzheimer's disease, unspecified: Secondary | ICD-10-CM | POA: Diagnosis not present

## 2017-04-26 DIAGNOSIS — K210 Gastro-esophageal reflux disease with esophagitis: Secondary | ICD-10-CM | POA: Diagnosis not present

## 2017-04-26 DIAGNOSIS — I672 Cerebral atherosclerosis: Secondary | ICD-10-CM | POA: Diagnosis not present

## 2017-04-26 DIAGNOSIS — G308 Other Alzheimer's disease: Secondary | ICD-10-CM | POA: Diagnosis not present

## 2017-04-26 DIAGNOSIS — N3000 Acute cystitis without hematuria: Secondary | ICD-10-CM | POA: Diagnosis not present

## 2017-04-26 DIAGNOSIS — F0280 Dementia in other diseases classified elsewhere without behavioral disturbance: Secondary | ICD-10-CM | POA: Diagnosis not present

## 2017-04-26 DIAGNOSIS — D509 Iron deficiency anemia, unspecified: Secondary | ICD-10-CM | POA: Diagnosis not present

## 2017-04-26 DIAGNOSIS — Z7982 Long term (current) use of aspirin: Secondary | ICD-10-CM | POA: Diagnosis not present

## 2017-04-26 DIAGNOSIS — G8929 Other chronic pain: Secondary | ICD-10-CM | POA: Diagnosis not present

## 2017-04-26 DIAGNOSIS — R269 Unspecified abnormalities of gait and mobility: Secondary | ICD-10-CM | POA: Diagnosis not present

## 2017-04-27 DIAGNOSIS — G308 Other Alzheimer's disease: Secondary | ICD-10-CM | POA: Diagnosis not present

## 2017-04-28 DIAGNOSIS — G308 Other Alzheimer's disease: Secondary | ICD-10-CM | POA: Diagnosis not present

## 2017-04-29 DIAGNOSIS — G308 Other Alzheimer's disease: Secondary | ICD-10-CM | POA: Diagnosis not present

## 2017-04-30 ENCOUNTER — Other Ambulatory Visit: Payer: Self-pay

## 2017-04-30 ENCOUNTER — Encounter: Payer: Self-pay | Admitting: Urology

## 2017-04-30 ENCOUNTER — Ambulatory Visit (INDEPENDENT_AMBULATORY_CARE_PROVIDER_SITE_OTHER): Payer: Medicare HMO | Admitting: Urology

## 2017-04-30 VITALS — BP 108/59 | HR 70 | Ht 65.0 in | Wt 134.1 lb

## 2017-04-30 DIAGNOSIS — N952 Postmenopausal atrophic vaginitis: Secondary | ICD-10-CM

## 2017-04-30 DIAGNOSIS — Z87440 Personal history of urinary (tract) infections: Secondary | ICD-10-CM

## 2017-04-30 DIAGNOSIS — N3946 Mixed incontinence: Secondary | ICD-10-CM

## 2017-04-30 DIAGNOSIS — G308 Other Alzheimer's disease: Secondary | ICD-10-CM | POA: Diagnosis not present

## 2017-04-30 LAB — BLADDER SCAN AMB NON-IMAGING: SCAN RESULT: 27

## 2017-04-30 NOTE — Progress Notes (Signed)
04/30/2017 9:49 AM   Jamie Boyd 04/18/1950 [id number]  Referring provider: Sharyne Peach, MD [place],  [id number]  Chief Complaint  Patient presents with  . Recurrent UTI    HPI: 52 AAF with vaginal atrophy and incontinence who presents today for a three week follow up after a trial of Myrbetriq 50 mg with her sister in law, Lattie Haw.     Background history Patient is a 67 -year-old [place] American female who is referred to [place] by, Dr. Enzo Bi, for recurrent urinary tract infections with her sister in law, Lattie Haw.  Patient states that she has had three urinary tract infections over the last year.  Reviewing her records,  she has had several cultures positive for multiple species.  Her symptoms with a urinary tract infection consist of dysuria and mid back pain.  She endorses dysuria, gross hematuria, suprapubic pain and  back pain.  She has not had any recent fevers, chills, nausea or vomiting.  She does have a history of nephrolithiasis, she has not had GU surgery or GU trauma.  She is not sexually active.  She not postmenopausal.   She denies constipation and/or diarrhea.   She does not engage in good perineal hygiene. She does not take tub baths.  She has incontinence.  She is having SUI and UI.  She is using incontinence pads x 5.   CATH UA was negative.  PVR is 20 mL.   She is not having pain with bladder filling.   CT Renal stone study performed in 11/2015 noted a left lower pole non obstructing 11 mm stone.   She is drinking 2 bottles of water daily.  She is not taking cranberry tablets, probiotics or Vitamin C.    At her 03/19/2017 appointment, patient is a poor historian and he son could not give an opinion regarding her urinary symptoms at this time.  Her PVR is 69 mL.  Her BP is 90/62.   She is not having gross hematuria or suprapubic pain.  She is not having fevers, chills, nausea or vomiting.   Spoke with [place] at [facility] and reviewed the meds.  She  is not on a bladder agent at this time and they state she is soaking through her pads.   She is not using her vaginal estrogen cream three nights weekly.   She was restarted on the Myrbetriq 50 mg daily at this visit.  Today, they state that they have not noticed a difference with the Myrbetriq.  Her PVR is 26 mL.   Her BP is 108/59.  Patient denies any gross hematuria, dysuria or suprapubic/flank pain.  Patient denies any fevers, chills, nausea or vomiting.     PMH: Past Medical History:  Diagnosis Date  . Alzheimer disease   . Anemia   . Dementia   . Goiter   . Memory deficit   . Urinary tract infection, site not specified     Surgical History: Past Surgical History:  Procedure Laterality Date  . COLONOSCOPY WITH PROPOFOL N/A 02/18/2015   Procedure: COLONOSCOPY WITH PROPOFOL;  Surgeon: Hulen Luster, MD;  Location: [facility] ENDOSCOPY;  Service: Gastroenterology;  Laterality: N/A;  . ESOPHAGOGASTRODUODENOSCOPY N/A 02/18/2015   Procedure: ESOPHAGOGASTRODUODENOSCOPY (EGD);  Surgeon: Hulen Luster, MD;  Location: [facility] ENDOSCOPY;  Service: Gastroenterology;  Laterality: N/A;    Home Medications:  Allergies as of 04/30/2017      Reactions   Pollen Extract Other (See Comments)  Medication List        Accurate as of 04/30/17  9:49 AM. Always use your most recent med list.          azelastine 0.1 % nasal spray Commonly known as:  ASTELIN Place 1 spray into both nostrils 2 (two) times daily. Use in each nostril as directed   memantine 10 MG tablet Commonly known as:  NAMENDA Take 10 mg by mouth 2 (two) times daily.   mirabegron ER 50 MG Tb24 tablet Commonly known as:  MYRBETRIQ Take 1 tablet (50 mg total) by mouth daily.       Allergies:  Allergies  Allergen Reactions  . Pollen Extract Other (See Comments)    Family History: Family History  Problem Relation Age of Onset  . Diabetes Maternal Uncle   . Prostate cancer Neg Hx   . Bladder Cancer Neg Hx   . Kidney cancer Neg  Hx     Social History:  reports that she quit smoking about 3 years ago. Her smoking use included cigarettes. She has never used smokeless tobacco. She reports that she does not drink alcohol or use drugs.  ROS: UROLOGY Frequent Urination?: Yes Hard to postpone urination?: No Burning/pain with urination?: No Get up at night to urinate?: No Leakage of urine?: Yes Urine stream starts and stops?: No Trouble starting stream?: No Do you have to strain to urinate?: No Blood in urine?: No Urinary tract infection?: Yes Sexually transmitted disease?: No Injury to kidneys or bladder?: No Painful intercourse?: No Weak stream?: No Currently pregnant?: No Vaginal bleeding?: No Last menstrual period?: n  Gastrointestinal Nausea?: No Vomiting?: No Indigestion/heartburn?: No Diarrhea?: No Constipation?: Yes  Constitutional Fever: No Night sweats?: No Weight loss?: No Fatigue?: No  Skin Skin rash/lesions?: No Itching?: No  Eyes Blurred vision?: No Double vision?: No  Ears/Nose/Throat Sore throat?: No Sinus problems?: Yes  Hematologic/Lymphatic Swollen glands?: No Easy bruising?: Yes  Cardiovascular Leg swelling?: Yes Chest pain?: No  Respiratory Cough?: No Shortness of breath?: No  Endocrine Excessive thirst?: No  Musculoskeletal Back pain?: No Joint pain?: Yes  Neurological Headaches?: No Dizziness?: No  Psychologic Depression?: No Anxiety?: No  Physical Exam: BP (!) 108/59 (BP Location: Right Arm, Patient Position: Sitting, Cuff Size: Normal)   Pulse 70   Ht '5\' 5"'$  (1.651 m)   Wt 134 lb 1.6 oz (60.8 kg)   BMI 22.32 kg/m   Constitutional: Well nourished. Alert and oriented, No acute distress. HEENT: [place] AT, moist mucus membranes. Trachea midline, no masses. Cardiovascular: No clubbing, cyanosis, or edema. Respiratory: Normal respiratory effort, no increased work of breathing. Skin: No rashes, bruises or suspicious lesions. Lymph: No cervical or  inguinal adenopathy. Neurologic: Grossly intact, no focal deficits, moving all 4 extremities. Psychiatric: Normal mood and affect.    Laboratory Data: Lab Results  Component Value Date   WBC 6.1 04/16/2017   HGB 12.1 04/16/2017   HCT 37.2 04/16/2017   MCV 91.5 04/16/2017   PLT 214 04/16/2017    Lab Results  Component Value Date   CREATININE 0.77 04/16/2017    Lab Results  Component Value Date   AST 46 (H) 04/16/2017   Lab Results  Component Value Date   ALT 38 04/16/2017    I have reviewed the labs.   Assessment & Plan:    1. Recurrent UTI's  - criteria for recurrent UTI has been met with 2 or more infections in 6 months or 3 or greater infections in one year   -  Patient is again instructed to increase their water intake until the urine is pale yellow or clear (10 to 12 cups daily)   - Patient is again instructed to take probiotics (yogurt, oral pills or vaginal suppositories), take cranberry pills or drink the juice and Vitamin C 1,000 mg daily to acidify the urine should be added to their daily regimen   - she is avoiding soaking in tubs   - encouraged to wipe front to back after urinating   - no UTI's since her last visit with [place]    2. Vaginal atrophy  - not having the estrogen vaginal cream three nights weekly   3. Incontinence  - offered behavioral therapies, bladder training, bladder control strategies and pelvic floor muscle training - patient with dementia  - fluid management - encouraged to drink 1.5 L of water daily  - family and staff do not see a difference with the Myrbetriq  - as patient is a difficult historian, it is decided to stop the Myrbetriq for one month then save all her depends over a 24 hour period and bring them to the office to weigh them, then restart the Myrbetriq 50 mg for three weeks and then save all her depends over a 24 hour period                                        Return for patient's family member to brind depends in one  month .  These notes generated with voice recognition software. I apologize for typographical errors.  Zara Council, [facility] [address] [place], [place] [address] [phone]

## 2017-05-01 DIAGNOSIS — G8929 Other chronic pain: Secondary | ICD-10-CM | POA: Diagnosis not present

## 2017-05-01 DIAGNOSIS — K219 Gastro-esophageal reflux disease without esophagitis: Secondary | ICD-10-CM | POA: Diagnosis not present

## 2017-05-01 DIAGNOSIS — G308 Other Alzheimer's disease: Secondary | ICD-10-CM | POA: Diagnosis not present

## 2017-05-01 DIAGNOSIS — F0280 Dementia in other diseases classified elsewhere without behavioral disturbance: Secondary | ICD-10-CM | POA: Diagnosis not present

## 2017-05-01 DIAGNOSIS — Z7982 Long term (current) use of aspirin: Secondary | ICD-10-CM | POA: Diagnosis not present

## 2017-05-01 DIAGNOSIS — G309 Alzheimer's disease, unspecified: Secondary | ICD-10-CM | POA: Diagnosis not present

## 2017-05-01 DIAGNOSIS — M6281 Muscle weakness (generalized): Secondary | ICD-10-CM | POA: Diagnosis not present

## 2017-05-01 DIAGNOSIS — M549 Dorsalgia, unspecified: Secondary | ICD-10-CM | POA: Diagnosis not present

## 2017-05-01 DIAGNOSIS — D126 Benign neoplasm of colon, unspecified: Secondary | ICD-10-CM | POA: Diagnosis not present

## 2017-05-01 DIAGNOSIS — D509 Iron deficiency anemia, unspecified: Secondary | ICD-10-CM | POA: Diagnosis not present

## 2017-05-02 DIAGNOSIS — G308 Other Alzheimer's disease: Secondary | ICD-10-CM | POA: Diagnosis not present

## 2017-05-03 DIAGNOSIS — F0280 Dementia in other diseases classified elsewhere without behavioral disturbance: Secondary | ICD-10-CM | POA: Diagnosis not present

## 2017-05-03 DIAGNOSIS — G309 Alzheimer's disease, unspecified: Secondary | ICD-10-CM | POA: Diagnosis not present

## 2017-05-03 DIAGNOSIS — M6281 Muscle weakness (generalized): Secondary | ICD-10-CM | POA: Diagnosis not present

## 2017-05-03 DIAGNOSIS — K219 Gastro-esophageal reflux disease without esophagitis: Secondary | ICD-10-CM | POA: Diagnosis not present

## 2017-05-03 DIAGNOSIS — G308 Other Alzheimer's disease: Secondary | ICD-10-CM | POA: Diagnosis not present

## 2017-05-03 DIAGNOSIS — D126 Benign neoplasm of colon, unspecified: Secondary | ICD-10-CM | POA: Diagnosis not present

## 2017-05-03 DIAGNOSIS — D509 Iron deficiency anemia, unspecified: Secondary | ICD-10-CM | POA: Diagnosis not present

## 2017-05-03 DIAGNOSIS — G8929 Other chronic pain: Secondary | ICD-10-CM | POA: Diagnosis not present

## 2017-05-03 DIAGNOSIS — Z7982 Long term (current) use of aspirin: Secondary | ICD-10-CM | POA: Diagnosis not present

## 2017-05-03 DIAGNOSIS — M549 Dorsalgia, unspecified: Secondary | ICD-10-CM | POA: Diagnosis not present

## 2017-05-04 DIAGNOSIS — G308 Other Alzheimer's disease: Secondary | ICD-10-CM | POA: Diagnosis not present

## 2017-05-05 DIAGNOSIS — G308 Other Alzheimer's disease: Secondary | ICD-10-CM | POA: Diagnosis not present

## 2017-05-06 DIAGNOSIS — G308 Other Alzheimer's disease: Secondary | ICD-10-CM | POA: Diagnosis not present

## 2017-05-07 DIAGNOSIS — G308 Other Alzheimer's disease: Secondary | ICD-10-CM | POA: Diagnosis not present

## 2017-05-08 DIAGNOSIS — M549 Dorsalgia, unspecified: Secondary | ICD-10-CM | POA: Diagnosis not present

## 2017-05-08 DIAGNOSIS — G309 Alzheimer's disease, unspecified: Secondary | ICD-10-CM | POA: Diagnosis not present

## 2017-05-08 DIAGNOSIS — D509 Iron deficiency anemia, unspecified: Secondary | ICD-10-CM | POA: Diagnosis not present

## 2017-05-08 DIAGNOSIS — D126 Benign neoplasm of colon, unspecified: Secondary | ICD-10-CM | POA: Diagnosis not present

## 2017-05-08 DIAGNOSIS — Z7982 Long term (current) use of aspirin: Secondary | ICD-10-CM | POA: Diagnosis not present

## 2017-05-08 DIAGNOSIS — R413 Other amnesia: Secondary | ICD-10-CM | POA: Diagnosis not present

## 2017-05-08 DIAGNOSIS — K219 Gastro-esophageal reflux disease without esophagitis: Secondary | ICD-10-CM | POA: Diagnosis not present

## 2017-05-08 DIAGNOSIS — F0280 Dementia in other diseases classified elsewhere without behavioral disturbance: Secondary | ICD-10-CM | POA: Diagnosis not present

## 2017-05-08 DIAGNOSIS — G8929 Other chronic pain: Secondary | ICD-10-CM | POA: Diagnosis not present

## 2017-05-08 DIAGNOSIS — G308 Other Alzheimer's disease: Secondary | ICD-10-CM | POA: Diagnosis not present

## 2017-05-08 DIAGNOSIS — M6281 Muscle weakness (generalized): Secondary | ICD-10-CM | POA: Diagnosis not present

## 2017-05-09 DIAGNOSIS — K219 Gastro-esophageal reflux disease without esophagitis: Secondary | ICD-10-CM | POA: Diagnosis not present

## 2017-05-09 DIAGNOSIS — Z7982 Long term (current) use of aspirin: Secondary | ICD-10-CM | POA: Diagnosis not present

## 2017-05-09 DIAGNOSIS — D509 Iron deficiency anemia, unspecified: Secondary | ICD-10-CM | POA: Diagnosis not present

## 2017-05-09 DIAGNOSIS — M6281 Muscle weakness (generalized): Secondary | ICD-10-CM | POA: Diagnosis not present

## 2017-05-09 DIAGNOSIS — G8929 Other chronic pain: Secondary | ICD-10-CM | POA: Diagnosis not present

## 2017-05-09 DIAGNOSIS — M549 Dorsalgia, unspecified: Secondary | ICD-10-CM | POA: Diagnosis not present

## 2017-05-09 DIAGNOSIS — F0280 Dementia in other diseases classified elsewhere without behavioral disturbance: Secondary | ICD-10-CM | POA: Diagnosis not present

## 2017-05-09 DIAGNOSIS — G308 Other Alzheimer's disease: Secondary | ICD-10-CM | POA: Diagnosis not present

## 2017-05-09 DIAGNOSIS — G309 Alzheimer's disease, unspecified: Secondary | ICD-10-CM | POA: Diagnosis not present

## 2017-05-09 DIAGNOSIS — D126 Benign neoplasm of colon, unspecified: Secondary | ICD-10-CM | POA: Diagnosis not present

## 2017-05-10 ENCOUNTER — Telehealth: Payer: Self-pay

## 2017-05-10 DIAGNOSIS — G308 Other Alzheimer's disease: Secondary | ICD-10-CM | POA: Diagnosis not present

## 2017-05-10 NOTE — Telephone Encounter (Signed)
Patient's caregiver brought in pads collected over a 24 hour period per Shannon's last note.  Pads were weighed at 4.2 lbs.  Patient is to start Myrbetriq 50mg  for 3 wks and then collect another 24 hour period and weigh again to compare.

## 2017-05-11 DIAGNOSIS — G308 Other Alzheimer's disease: Secondary | ICD-10-CM | POA: Diagnosis not present

## 2017-05-12 DIAGNOSIS — G308 Other Alzheimer's disease: Secondary | ICD-10-CM | POA: Diagnosis not present

## 2017-05-13 DIAGNOSIS — G308 Other Alzheimer's disease: Secondary | ICD-10-CM | POA: Diagnosis not present

## 2017-05-14 DIAGNOSIS — G308 Other Alzheimer's disease: Secondary | ICD-10-CM | POA: Diagnosis not present

## 2017-05-15 DIAGNOSIS — G308 Other Alzheimer's disease: Secondary | ICD-10-CM | POA: Diagnosis not present

## 2017-05-16 DIAGNOSIS — D509 Iron deficiency anemia, unspecified: Secondary | ICD-10-CM | POA: Diagnosis not present

## 2017-05-16 DIAGNOSIS — G308 Other Alzheimer's disease: Secondary | ICD-10-CM | POA: Diagnosis not present

## 2017-05-16 DIAGNOSIS — M549 Dorsalgia, unspecified: Secondary | ICD-10-CM | POA: Diagnosis not present

## 2017-05-16 DIAGNOSIS — F0280 Dementia in other diseases classified elsewhere without behavioral disturbance: Secondary | ICD-10-CM | POA: Diagnosis not present

## 2017-05-16 DIAGNOSIS — K219 Gastro-esophageal reflux disease without esophagitis: Secondary | ICD-10-CM | POA: Diagnosis not present

## 2017-05-16 DIAGNOSIS — M6281 Muscle weakness (generalized): Secondary | ICD-10-CM | POA: Diagnosis not present

## 2017-05-16 DIAGNOSIS — Z7982 Long term (current) use of aspirin: Secondary | ICD-10-CM | POA: Diagnosis not present

## 2017-05-16 DIAGNOSIS — D126 Benign neoplasm of colon, unspecified: Secondary | ICD-10-CM | POA: Diagnosis not present

## 2017-05-16 DIAGNOSIS — G309 Alzheimer's disease, unspecified: Secondary | ICD-10-CM | POA: Diagnosis not present

## 2017-05-16 DIAGNOSIS — G8929 Other chronic pain: Secondary | ICD-10-CM | POA: Diagnosis not present

## 2017-05-17 DIAGNOSIS — G308 Other Alzheimer's disease: Secondary | ICD-10-CM | POA: Diagnosis not present

## 2017-05-18 DIAGNOSIS — K219 Gastro-esophageal reflux disease without esophagitis: Secondary | ICD-10-CM | POA: Diagnosis not present

## 2017-05-18 DIAGNOSIS — G308 Other Alzheimer's disease: Secondary | ICD-10-CM | POA: Diagnosis not present

## 2017-05-18 DIAGNOSIS — F0280 Dementia in other diseases classified elsewhere without behavioral disturbance: Secondary | ICD-10-CM | POA: Diagnosis not present

## 2017-05-18 DIAGNOSIS — G8929 Other chronic pain: Secondary | ICD-10-CM | POA: Diagnosis not present

## 2017-05-18 DIAGNOSIS — Z7982 Long term (current) use of aspirin: Secondary | ICD-10-CM | POA: Diagnosis not present

## 2017-05-18 DIAGNOSIS — M6281 Muscle weakness (generalized): Secondary | ICD-10-CM | POA: Diagnosis not present

## 2017-05-18 DIAGNOSIS — G309 Alzheimer's disease, unspecified: Secondary | ICD-10-CM | POA: Diagnosis not present

## 2017-05-18 DIAGNOSIS — M549 Dorsalgia, unspecified: Secondary | ICD-10-CM | POA: Diagnosis not present

## 2017-05-18 DIAGNOSIS — D509 Iron deficiency anemia, unspecified: Secondary | ICD-10-CM | POA: Diagnosis not present

## 2017-05-18 DIAGNOSIS — D126 Benign neoplasm of colon, unspecified: Secondary | ICD-10-CM | POA: Diagnosis not present

## 2017-05-19 DIAGNOSIS — G308 Other Alzheimer's disease: Secondary | ICD-10-CM | POA: Diagnosis not present

## 2017-05-20 DIAGNOSIS — G308 Other Alzheimer's disease: Secondary | ICD-10-CM | POA: Diagnosis not present

## 2017-05-21 DIAGNOSIS — G308 Other Alzheimer's disease: Secondary | ICD-10-CM | POA: Diagnosis not present

## 2017-05-22 DIAGNOSIS — G308 Other Alzheimer's disease: Secondary | ICD-10-CM | POA: Diagnosis not present

## 2017-05-23 DIAGNOSIS — G8929 Other chronic pain: Secondary | ICD-10-CM | POA: Diagnosis not present

## 2017-05-23 DIAGNOSIS — Z7982 Long term (current) use of aspirin: Secondary | ICD-10-CM | POA: Diagnosis not present

## 2017-05-23 DIAGNOSIS — F0280 Dementia in other diseases classified elsewhere without behavioral disturbance: Secondary | ICD-10-CM | POA: Diagnosis not present

## 2017-05-23 DIAGNOSIS — D509 Iron deficiency anemia, unspecified: Secondary | ICD-10-CM | POA: Diagnosis not present

## 2017-05-23 DIAGNOSIS — G308 Other Alzheimer's disease: Secondary | ICD-10-CM | POA: Diagnosis not present

## 2017-05-23 DIAGNOSIS — M6281 Muscle weakness (generalized): Secondary | ICD-10-CM | POA: Diagnosis not present

## 2017-05-23 DIAGNOSIS — K219 Gastro-esophageal reflux disease without esophagitis: Secondary | ICD-10-CM | POA: Diagnosis not present

## 2017-05-23 DIAGNOSIS — G309 Alzheimer's disease, unspecified: Secondary | ICD-10-CM | POA: Diagnosis not present

## 2017-05-23 DIAGNOSIS — D126 Benign neoplasm of colon, unspecified: Secondary | ICD-10-CM | POA: Diagnosis not present

## 2017-05-23 DIAGNOSIS — M549 Dorsalgia, unspecified: Secondary | ICD-10-CM | POA: Diagnosis not present

## 2017-05-24 DIAGNOSIS — G308 Other Alzheimer's disease: Secondary | ICD-10-CM | POA: Diagnosis not present

## 2017-05-25 DIAGNOSIS — D509 Iron deficiency anemia, unspecified: Secondary | ICD-10-CM | POA: Diagnosis not present

## 2017-05-25 DIAGNOSIS — G309 Alzheimer's disease, unspecified: Secondary | ICD-10-CM | POA: Diagnosis not present

## 2017-05-25 DIAGNOSIS — F0280 Dementia in other diseases classified elsewhere without behavioral disturbance: Secondary | ICD-10-CM | POA: Diagnosis not present

## 2017-05-25 DIAGNOSIS — Z7982 Long term (current) use of aspirin: Secondary | ICD-10-CM | POA: Diagnosis not present

## 2017-05-25 DIAGNOSIS — G8929 Other chronic pain: Secondary | ICD-10-CM | POA: Diagnosis not present

## 2017-05-25 DIAGNOSIS — M6281 Muscle weakness (generalized): Secondary | ICD-10-CM | POA: Diagnosis not present

## 2017-05-25 DIAGNOSIS — K219 Gastro-esophageal reflux disease without esophagitis: Secondary | ICD-10-CM | POA: Diagnosis not present

## 2017-05-25 DIAGNOSIS — M549 Dorsalgia, unspecified: Secondary | ICD-10-CM | POA: Diagnosis not present

## 2017-05-25 DIAGNOSIS — G308 Other Alzheimer's disease: Secondary | ICD-10-CM | POA: Diagnosis not present

## 2017-05-25 DIAGNOSIS — D126 Benign neoplasm of colon, unspecified: Secondary | ICD-10-CM | POA: Diagnosis not present

## 2017-05-26 DIAGNOSIS — G308 Other Alzheimer's disease: Secondary | ICD-10-CM | POA: Diagnosis not present

## 2017-05-27 DIAGNOSIS — G308 Other Alzheimer's disease: Secondary | ICD-10-CM | POA: Diagnosis not present

## 2017-05-28 DIAGNOSIS — G308 Other Alzheimer's disease: Secondary | ICD-10-CM | POA: Diagnosis not present

## 2017-05-28 DIAGNOSIS — K219 Gastro-esophageal reflux disease without esophagitis: Secondary | ICD-10-CM | POA: Diagnosis not present

## 2017-05-28 DIAGNOSIS — Z7982 Long term (current) use of aspirin: Secondary | ICD-10-CM | POA: Diagnosis not present

## 2017-05-28 DIAGNOSIS — G8929 Other chronic pain: Secondary | ICD-10-CM | POA: Diagnosis not present

## 2017-05-28 DIAGNOSIS — D126 Benign neoplasm of colon, unspecified: Secondary | ICD-10-CM | POA: Diagnosis not present

## 2017-05-28 DIAGNOSIS — M6281 Muscle weakness (generalized): Secondary | ICD-10-CM | POA: Diagnosis not present

## 2017-05-28 DIAGNOSIS — M549 Dorsalgia, unspecified: Secondary | ICD-10-CM | POA: Diagnosis not present

## 2017-05-28 DIAGNOSIS — G309 Alzheimer's disease, unspecified: Secondary | ICD-10-CM | POA: Diagnosis not present

## 2017-05-28 DIAGNOSIS — D509 Iron deficiency anemia, unspecified: Secondary | ICD-10-CM | POA: Diagnosis not present

## 2017-05-28 DIAGNOSIS — F0280 Dementia in other diseases classified elsewhere without behavioral disturbance: Secondary | ICD-10-CM | POA: Diagnosis not present

## 2017-05-29 DIAGNOSIS — G308 Other Alzheimer's disease: Secondary | ICD-10-CM | POA: Diagnosis not present

## 2017-05-30 DIAGNOSIS — G308 Other Alzheimer's disease: Secondary | ICD-10-CM | POA: Diagnosis not present

## 2017-05-31 DIAGNOSIS — G8929 Other chronic pain: Secondary | ICD-10-CM | POA: Diagnosis not present

## 2017-05-31 DIAGNOSIS — M549 Dorsalgia, unspecified: Secondary | ICD-10-CM | POA: Diagnosis not present

## 2017-05-31 DIAGNOSIS — F0280 Dementia in other diseases classified elsewhere without behavioral disturbance: Secondary | ICD-10-CM | POA: Diagnosis not present

## 2017-05-31 DIAGNOSIS — K219 Gastro-esophageal reflux disease without esophagitis: Secondary | ICD-10-CM | POA: Diagnosis not present

## 2017-05-31 DIAGNOSIS — G309 Alzheimer's disease, unspecified: Secondary | ICD-10-CM | POA: Diagnosis not present

## 2017-05-31 DIAGNOSIS — Z7982 Long term (current) use of aspirin: Secondary | ICD-10-CM | POA: Diagnosis not present

## 2017-05-31 DIAGNOSIS — D126 Benign neoplasm of colon, unspecified: Secondary | ICD-10-CM | POA: Diagnosis not present

## 2017-05-31 DIAGNOSIS — M6281 Muscle weakness (generalized): Secondary | ICD-10-CM | POA: Diagnosis not present

## 2017-05-31 DIAGNOSIS — D509 Iron deficiency anemia, unspecified: Secondary | ICD-10-CM | POA: Diagnosis not present

## 2017-05-31 DIAGNOSIS — G308 Other Alzheimer's disease: Secondary | ICD-10-CM | POA: Diagnosis not present

## 2017-06-01 DIAGNOSIS — G308 Other Alzheimer's disease: Secondary | ICD-10-CM | POA: Diagnosis not present

## 2017-06-02 DIAGNOSIS — G308 Other Alzheimer's disease: Secondary | ICD-10-CM | POA: Diagnosis not present

## 2017-06-03 DIAGNOSIS — G308 Other Alzheimer's disease: Secondary | ICD-10-CM | POA: Diagnosis not present

## 2017-06-04 DIAGNOSIS — G308 Other Alzheimer's disease: Secondary | ICD-10-CM | POA: Diagnosis not present

## 2017-06-05 DIAGNOSIS — M6281 Muscle weakness (generalized): Secondary | ICD-10-CM | POA: Diagnosis not present

## 2017-06-05 DIAGNOSIS — G308 Other Alzheimer's disease: Secondary | ICD-10-CM | POA: Diagnosis not present

## 2017-06-05 DIAGNOSIS — G8929 Other chronic pain: Secondary | ICD-10-CM | POA: Diagnosis not present

## 2017-06-05 DIAGNOSIS — Z7982 Long term (current) use of aspirin: Secondary | ICD-10-CM | POA: Diagnosis not present

## 2017-06-05 DIAGNOSIS — G309 Alzheimer's disease, unspecified: Secondary | ICD-10-CM | POA: Diagnosis not present

## 2017-06-05 DIAGNOSIS — D509 Iron deficiency anemia, unspecified: Secondary | ICD-10-CM | POA: Diagnosis not present

## 2017-06-05 DIAGNOSIS — D126 Benign neoplasm of colon, unspecified: Secondary | ICD-10-CM | POA: Diagnosis not present

## 2017-06-05 DIAGNOSIS — F0280 Dementia in other diseases classified elsewhere without behavioral disturbance: Secondary | ICD-10-CM | POA: Diagnosis not present

## 2017-06-05 DIAGNOSIS — K219 Gastro-esophageal reflux disease without esophagitis: Secondary | ICD-10-CM | POA: Diagnosis not present

## 2017-06-05 DIAGNOSIS — M549 Dorsalgia, unspecified: Secondary | ICD-10-CM | POA: Diagnosis not present

## 2017-06-08 DIAGNOSIS — M549 Dorsalgia, unspecified: Secondary | ICD-10-CM | POA: Diagnosis not present

## 2017-06-08 DIAGNOSIS — Z7982 Long term (current) use of aspirin: Secondary | ICD-10-CM | POA: Diagnosis not present

## 2017-06-08 DIAGNOSIS — G8929 Other chronic pain: Secondary | ICD-10-CM | POA: Diagnosis not present

## 2017-06-08 DIAGNOSIS — D509 Iron deficiency anemia, unspecified: Secondary | ICD-10-CM | POA: Diagnosis not present

## 2017-06-08 DIAGNOSIS — G309 Alzheimer's disease, unspecified: Secondary | ICD-10-CM | POA: Diagnosis not present

## 2017-06-08 DIAGNOSIS — D126 Benign neoplasm of colon, unspecified: Secondary | ICD-10-CM | POA: Diagnosis not present

## 2017-06-08 DIAGNOSIS — K219 Gastro-esophageal reflux disease without esophagitis: Secondary | ICD-10-CM | POA: Diagnosis not present

## 2017-06-08 DIAGNOSIS — F0280 Dementia in other diseases classified elsewhere without behavioral disturbance: Secondary | ICD-10-CM | POA: Diagnosis not present

## 2017-06-08 DIAGNOSIS — M6281 Muscle weakness (generalized): Secondary | ICD-10-CM | POA: Diagnosis not present

## 2017-06-12 DIAGNOSIS — J302 Other seasonal allergic rhinitis: Secondary | ICD-10-CM | POA: Diagnosis not present

## 2017-06-12 DIAGNOSIS — Z79899 Other long term (current) drug therapy: Secondary | ICD-10-CM | POA: Diagnosis not present

## 2017-06-12 DIAGNOSIS — F0390 Unspecified dementia without behavioral disturbance: Secondary | ICD-10-CM | POA: Diagnosis not present

## 2017-06-12 DIAGNOSIS — R4181 Age-related cognitive decline: Secondary | ICD-10-CM | POA: Diagnosis not present

## 2017-06-15 DIAGNOSIS — Z79899 Other long term (current) drug therapy: Secondary | ICD-10-CM | POA: Diagnosis not present

## 2017-06-16 ENCOUNTER — Emergency Department
Admission: EM | Admit: 2017-06-16 | Discharge: 2017-06-16 | Disposition: A | Discharge status: Home / Self Care | Payer: Medicare HMO | Attending: Emergency Medicine | Admitting: Emergency Medicine

## 2017-06-16 ENCOUNTER — Encounter: Payer: Self-pay | Admitting: Emergency Medicine

## 2017-06-16 ENCOUNTER — Other Ambulatory Visit: Payer: Self-pay

## 2017-06-16 DIAGNOSIS — R4182 Altered mental status, unspecified: Secondary | ICD-10-CM | POA: Diagnosis not present

## 2017-06-16 DIAGNOSIS — Z7982 Long term (current) use of aspirin: Secondary | ICD-10-CM | POA: Diagnosis not present

## 2017-06-16 DIAGNOSIS — F0281 Dementia in other diseases classified elsewhere with behavioral disturbance: Secondary | ICD-10-CM | POA: Diagnosis not present

## 2017-06-16 DIAGNOSIS — Z87891 Personal history of nicotine dependence: Secondary | ICD-10-CM | POA: Insufficient documentation

## 2017-06-16 DIAGNOSIS — G309 Alzheimer's disease, unspecified: Secondary | ICD-10-CM | POA: Diagnosis not present

## 2017-06-16 DIAGNOSIS — Z79899 Other long term (current) drug therapy: Secondary | ICD-10-CM | POA: Diagnosis not present

## 2017-06-16 LAB — CBC
HEMATOCRIT: 29.3 % — AB (ref 35.0–47.0)
Hemoglobin: 9.8 g/dL — ABNORMAL LOW (ref 12.0–16.0)
MCH: 30.8 pg (ref 26.0–34.0)
MCHC: 33.3 g/dL (ref 32.0–36.0)
MCV: 92.3 fL (ref 80.0–100.0)
Platelets: 290 10*3/uL (ref 150–440)
RBC: 3.17 MIL/uL — ABNORMAL LOW (ref 3.80–5.20)
RDW: 16.3 % — ABNORMAL HIGH (ref 11.5–14.5)
WBC: 8.8 10*3/uL (ref 3.6–11.0)

## 2017-06-16 LAB — COMPREHENSIVE METABOLIC PANEL
ALK PHOS: 74 U/L (ref 38–126)
ALT: 20 U/L (ref 14–54)
ANION GAP: 4 — AB (ref 5–15)
AST: 27 U/L (ref 15–41)
Albumin: 3.3 g/dL — ABNORMAL LOW (ref 3.5–5.0)
BUN: 11 mg/dL (ref 6–20)
CALCIUM: 8.6 mg/dL — AB (ref 8.9–10.3)
CO2: 29 mmol/L (ref 22–32)
CREATININE: 0.65 mg/dL (ref 0.44–1.00)
Chloride: 107 mmol/L (ref 101–111)
GFR calc Af Amer: >60 mL/min (ref >60)
GFR calc non Af Amer: >60 mL/min (ref >60)
Glucose, Bld: 86 mg/dL (ref 65–99)
Potassium: 3.9 mmol/L (ref 3.5–5.1)
SODIUM: 140 mmol/L (ref 135–145)
Total Bilirubin: 0.6 mg/dL (ref 0.3–1.2)
Total Protein: 6.7 g/dL (ref 6.5–8.1)

## 2017-06-16 LAB — URINALYSIS, COMPLETE (UACMP) WITH MICROSCOPIC
BILIRUBIN URINE: NEGATIVE
Bacteria, UA: NONE SEEN
Glucose, UA: NEGATIVE mg/dL
Hgb urine dipstick: NEGATIVE
KETONES UR: NEGATIVE mg/dL
LEUKOCYTES UA: NEGATIVE
Nitrite: POSITIVE — AB
PROTEIN: NEGATIVE mg/dL
SPECIFIC GRAVITY, URINE: 1.01 (ref 1.005–1.030)
SQUAMOUS EPITHELIAL / LPF: NONE SEEN (ref 0–5)
pH: 7 (ref 5.0–8.0)

## 2017-06-16 MED ORDER — NITROFURANTOIN MONOHYD MACRO 100 MG PO CAPS: 100.0000 mg | ORAL_CAPSULE | Freq: Two times a day (BID) | ORAL | 0 refills | Status: AC

## 2017-06-16 NOTE — ED Notes (Signed)
In and out cath done, pt tolerated well. Assisted by Crystal NT. Urine sent to lab.

## 2017-06-16 NOTE — ED Provider Notes (Signed)
[facility] Emergency Department Provider Note   ____________________________________________    I have reviewed the triage vital signs and the nursing notes.   HISTORY  Chief Complaint Altered Mental Status   Patient has dementia  HPI Jamie Boyd is a 67 y.o. female with a history of Alzheimer's disease presents reportedly for change in behavior.  Reportedly the patient was not acting as usual at nursing home.  Patient is unable to give any history but denies any complaints currently   Past Medical History:  Diagnosis Date  . Alzheimer disease   . Anemia   . Dementia   . Goiter   . Memory deficit   . Urinary tract infection, site not specified     Patient Active Problem List   Diagnosis Date Noted  . CVA (cerebral vascular accident) ([place]) 01/24/2017  . Dehydration 01/20/2017  . Failure to thrive in adult 01/20/2017  . Hypotension 01/20/2017  . Agitation 11/23/2015  . Moderate dementia, with behavioral disturbance 11/23/2015  . Gastroesophageal reflux disease without esophagitis 09/11/2015  . Iron deficiency anemia due to chronic blood loss 06/02/2015  . GERD without esophagitis 06/01/2015  . Difficulty sleeping 04/27/2015  . Moderate dementia without behavioral disturbance 03/01/2015  . Acute UTI 01/29/2015  . Anemia 01/29/2015  . Memory deficit 01/29/2015    Past Surgical History:  Procedure Laterality Date  . COLONOSCOPY WITH PROPOFOL N/A 02/18/2015   Procedure: COLONOSCOPY WITH PROPOFOL;  Surgeon: Hulen Luster, MD;  Location: [facility] ENDOSCOPY;  Service: Gastroenterology;  Laterality: N/A;  . ESOPHAGOGASTRODUODENOSCOPY N/A 02/18/2015   Procedure: ESOPHAGOGASTRODUODENOSCOPY (EGD);  Surgeon: Hulen Luster, MD;  Location: [facility] ENDOSCOPY;  Service: Gastroenterology;  Laterality: N/A;    Prior to Admission medications   Medication Sig Start Date End Date Taking? Authorizing Provider  acetaminophen (TYLENOL) 500 MG tablet Take 500 mg by mouth  every 6 (six) hours as needed for mild pain.   Yes [provider]  aspirin EC 81 MG tablet Take 81 mg by mouth daily.   Yes [provider]  atorvastatin (LIPITOR) 40 MG tablet Take 40 mg by mouth daily at 6 PM.   Yes [provider]  Azelastine HCl 0.15 % SOLN Place 1 spray into the nose daily as needed (allergies).   Yes [provider]  Azelastine HCl 137 MCG/SPRAY SOLN Place 1 spray into both nostrils 2 (two) times daily. Use in each nostril as directed    Yes [provider]  diclofenac (VOLTAREN) 50 MG EC tablet Take 50 mg by mouth daily.   Yes [provider]  divalproex (DEPAKOTE) 250 MG DR tablet Take 250 mg by mouth 3 (three) times daily.   Yes [provider]  donepezil (ARICEPT) 10 MG tablet Take 10 mg by mouth at bedtime.   Yes [provider]  doxycycline (VIBRA-TABS) 100 MG tablet Take 100 mg by mouth 2 (two) times daily. 04/14/17  Yes [provider]  esomeprazole (NEXIUM) 40 MG capsule Take 40 mg by mouth daily as needed (acid reflux).   Yes [provider]  ferrous sulfate 325 (65 FE) MG EC tablet Take 325 mg by mouth daily with breakfast.   Yes [provider]  fexofenadine (ALLEGRA) 180 MG tablet Take 180 mg by mouth daily.   Yes [provider]  glucosamine-chondroitin 500-400 MG tablet Take 1 tablet by mouth 3 (three) times daily.   Yes [provider]  memantine (NAMENDA) 10 MG tablet Take 10 mg by  mouth 2 (two) times daily.  11/23/15 11/27/17 Yes [provider]  mirabegron ER (MYRBETRIQ) 50 MG TB24 tablet Take 1 tablet (50 mg total) by mouth daily. 03/19/17  Yes McGowan, Larene Beach A, PA-C  montelukast (SINGULAIR) 10 MG tablet Take 10 mg by mouth at bedtime.   Yes [provider]  Multiple Vitamins-Minerals (CENTRUM SILVER) tablet Take 1 tablet by mouth daily.   Yes [provider]  nicotine polacrilex (COMMIT) 4 MG lozenge Take 4 mg by  mouth as needed for smoking cessation.   Yes [provider]  omega-3 acid ethyl esters (LOVAZA) 1 g capsule Take 1 g by mouth 2 (two) times daily.   Yes [provider]  phenazopyridine (PYRIDIUM) 200 MG tablet Take 200 mg by mouth 3 (three) times daily with meals.   Yes [provider]  QUEtiapine (SEROQUEL) 100 MG tablet Take 100 mg by mouth at bedtime.   Yes [provider]  traZODone (DESYREL) 100 MG tablet Take 100 mg by mouth at bedtime.   Yes [provider]  vitamin E 400 UNIT capsule Take 400 Units by mouth daily.   Yes [provider]  nitrofurantoin, macrocrystal-monohydrate, (MACROBID) 100 MG capsule Take 1 capsule (100 mg total) by mouth 2 (two) times daily for 7 days. 06/16/17 06/23/17  Lavonia Drafts, MD     Allergies Pollen extract  Family History  Problem Relation Age of Onset  . Diabetes Maternal Uncle   . Prostate cancer Neg Hx   . Bladder Cancer Neg Hx   . Kidney cancer Neg Hx     Social History Social History   Tobacco Use  . Smoking status: Former Smoker    Types: Cigarettes    Last attempt to quit: 01/25/2014    Years since quitting: 3.3  . Smokeless tobacco: Never Used  Substance Use Topics  . Alcohol use: No  . Drug use: No    5 caveat: Unable to obtain accurate review of Systems due to dementia     ____________________________________________   PHYSICAL EXAM:  VITAL SIGNS: ED Triage Vitals  Enc Vitals Group     BP 06/16/17 0948 (!) 95/55     Pulse Rate 06/16/17 0948 78     Resp 06/16/17 0948 16     Temp 06/16/17 0948 97.8 F (36.6 C)     Temp Source 06/16/17 0948 Oral     SpO2 06/16/17 0948 100 %     Weight 06/16/17 0949 60.8 kg (134 lb)     Height --      Head Circumference --      Peak Flow --      Pain Score 06/16/17 0949 0     Pain Loc --      Pain Edu? --      Excl. in [place]? --     Constitutional: Alert. No acute distress.  Eyes: Conjunctivae are normal.  Head:  Atraumatic. Nose: No congestion/rhinnorhea. Mouth/Throat: Mucous membranes are moist.    Cardiovascular: Normal rate, regular rhythm. Grossly normal heart sounds.  Good peripheral circulation. Respiratory: Normal respiratory effort.  No retractions. Lungs CTAB. Gastrointestinal: Soft and nontender. No distention.   Musculoskeletal: No lower extremity tenderness nor edema.  Warm and well perfused Neurologic:  Normal speech and language. No gross focal neurologic deficits are appreciated.  Moves all extremities equally Skin:  Skin is warm, dry and intact. No rash noted. Psychiatric: Mood and affect are normal. Speech and behavior are normal.  ____________________________________________   LABS (  all labs ordered are listed, but only abnormal results are displayed)  Labs Reviewed  CBC - Abnormal; Notable for the following components:      Result Value   RBC 3.17 (*)    Hemoglobin 9.8 (*)    HCT 29.3 (*)    RDW 16.3 (*)    All other components within normal limits  COMPREHENSIVE METABOLIC PANEL - Abnormal; Notable for the following components:   Calcium 8.6 (*)    Albumin 3.3 (*)    Anion gap 4 (*)    All other components within normal limits  URINALYSIS, COMPLETE (UACMP) WITH MICROSCOPIC - Abnormal; Notable for the following components:   Color, Urine AMBER (*)    APPearance CLEAR (*)    Nitrite POSITIVE (*)    All other components within normal limits  URINE CULTURE   ____________________________________________  EKG   ____________________________________________  RADIOLOGY  None ____________________________________________   PROCEDURES  Procedure(s) performed: No  Procedures   Critical Care performed: No ____________________________________________   INITIAL IMPRESSION / ASSESSMENT AND PLAN / ED COURSE  Pertinent labs & imaging results that were available during my care of the patient were reviewed by me and considered in my medical decision making (see  chart for details).  Patient well-appearing in no acute distress, she appears to be at baseline currently.  Will check basic labs, urinalysis.  Neuro exam is unremarkable  Labs are overall unremarkable, urinalysis demonstrates positive nitrites, will cover with antibiotics.  Appropriate for discharge at this time the patient appears to be at her baseline, vitals normal    ____________________________________________   FINAL CLINICAL IMPRESSION(S) / ED DIAGNOSES  Final diagnoses:  Altered mental status, unspecified altered mental status type        Note:  This document was prepared using Dragon voice recognition software and may include unintentional dictation errors.    Lavonia Drafts, MD 06/16/17 [phone]

## 2017-06-16 NOTE — ED Notes (Signed)
Attempted blood draw, unsuccessful. 

## 2017-06-16 NOTE — ED Triage Notes (Addendum)
Pt to ed from [facility] for altered mental status.  Brought by EMS.  Pt alert but confused to date/time, and place.  EMS reports pt stated she felt much better when they arrived to [facility].  Pt smiling and jovial at times during triage.  Skin warm and dry. Pt denies pain at this time. Per staff at [facility] pt is usually happy, laughing making jokes,  Staff report this am she was quiet and not laughing so they were concerned about her mental status.  Pt does have hx of dementia.

## 2017-06-16 NOTE — Discharge Instructions (Addendum)
Patient had a reassuring workup in the emergency department. She may have a mild urinary tract infection. Please complete course of antibiotics.

## 2017-06-17 LAB — URINE CULTURE: Culture: NO GROWTH

## 2017-06-18 ENCOUNTER — Other Ambulatory Visit: Payer: Self-pay | Admitting: *Deleted

## 2017-06-18 DIAGNOSIS — Z79899 Other long term (current) drug therapy: Secondary | ICD-10-CM | POA: Diagnosis not present

## 2017-06-18 DIAGNOSIS — R4182 Altered mental status, unspecified: Secondary | ICD-10-CM | POA: Diagnosis not present

## 2017-06-18 DIAGNOSIS — N390 Urinary tract infection, site not specified: Secondary | ICD-10-CM | POA: Diagnosis not present

## 2017-06-18 DIAGNOSIS — G308 Other Alzheimer's disease: Secondary | ICD-10-CM | POA: Diagnosis not present

## 2017-06-18 DIAGNOSIS — N3000 Acute cystitis without hematuria: Secondary | ICD-10-CM | POA: Diagnosis not present

## 2017-06-18 NOTE — Patient Outreach (Signed)
[facility]) Care Management  06/18/2017  Jamie Boyd [birth year] [id number]   Referral received for [name] after her recent presentation to the ED for mental status changes. [name] is a resident in memory care at [facility] where she has access to full case management services on site.  Plan: We will not open [name]'s case at this time as doing so would represent duplication of services.    [facility] [facility]  [phone]

## 2017-06-19 DIAGNOSIS — G308 Other Alzheimer's disease: Secondary | ICD-10-CM | POA: Diagnosis not present

## 2017-06-20 DIAGNOSIS — G308 Other Alzheimer's disease: Secondary | ICD-10-CM | POA: Diagnosis not present

## 2017-06-21 DIAGNOSIS — G308 Other Alzheimer's disease: Secondary | ICD-10-CM | POA: Diagnosis not present

## 2017-06-21 DIAGNOSIS — E162 Hypoglycemia, unspecified: Secondary | ICD-10-CM | POA: Diagnosis not present

## 2017-06-22 DIAGNOSIS — G308 Other Alzheimer's disease: Secondary | ICD-10-CM | POA: Diagnosis not present

## 2017-06-23 DIAGNOSIS — G308 Other Alzheimer's disease: Secondary | ICD-10-CM | POA: Diagnosis not present

## 2017-06-24 DIAGNOSIS — G308 Other Alzheimer's disease: Secondary | ICD-10-CM | POA: Diagnosis not present

## 2017-06-25 DIAGNOSIS — G308 Other Alzheimer's disease: Secondary | ICD-10-CM | POA: Diagnosis not present

## 2017-06-26 DIAGNOSIS — G308 Other Alzheimer's disease: Secondary | ICD-10-CM | POA: Diagnosis not present

## 2017-06-27 DIAGNOSIS — G308 Other Alzheimer's disease: Secondary | ICD-10-CM | POA: Diagnosis not present

## 2017-06-28 DIAGNOSIS — G308 Other Alzheimer's disease: Secondary | ICD-10-CM | POA: Diagnosis not present

## 2017-06-29 DIAGNOSIS — G308 Other Alzheimer's disease: Secondary | ICD-10-CM | POA: Diagnosis not present

## 2017-06-30 DIAGNOSIS — G308 Other Alzheimer's disease: Secondary | ICD-10-CM | POA: Diagnosis not present

## 2017-07-01 DIAGNOSIS — G308 Other Alzheimer's disease: Secondary | ICD-10-CM | POA: Diagnosis not present

## 2017-07-02 DIAGNOSIS — G308 Other Alzheimer's disease: Secondary | ICD-10-CM | POA: Diagnosis not present

## 2017-07-03 DIAGNOSIS — G308 Other Alzheimer's disease: Secondary | ICD-10-CM | POA: Diagnosis not present

## 2017-07-04 DIAGNOSIS — G308 Other Alzheimer's disease: Secondary | ICD-10-CM | POA: Diagnosis not present

## 2017-07-05 DIAGNOSIS — G308 Other Alzheimer's disease: Secondary | ICD-10-CM | POA: Diagnosis not present

## 2017-07-06 DIAGNOSIS — G308 Other Alzheimer's disease: Secondary | ICD-10-CM | POA: Diagnosis not present

## 2017-07-07 DIAGNOSIS — G308 Other Alzheimer's disease: Secondary | ICD-10-CM | POA: Diagnosis not present

## 2017-07-08 DIAGNOSIS — G308 Other Alzheimer's disease: Secondary | ICD-10-CM | POA: Diagnosis not present

## 2017-07-09 DIAGNOSIS — G308 Other Alzheimer's disease: Secondary | ICD-10-CM | POA: Diagnosis not present

## 2017-07-10 DIAGNOSIS — G308 Other Alzheimer's disease: Secondary | ICD-10-CM | POA: Diagnosis not present

## 2017-07-11 DIAGNOSIS — G308 Other Alzheimer's disease: Secondary | ICD-10-CM | POA: Diagnosis not present

## 2017-07-12 DIAGNOSIS — G308 Other Alzheimer's disease: Secondary | ICD-10-CM | POA: Diagnosis not present

## 2017-07-13 DIAGNOSIS — G308 Other Alzheimer's disease: Secondary | ICD-10-CM | POA: Diagnosis not present

## 2017-07-14 DIAGNOSIS — G308 Other Alzheimer's disease: Secondary | ICD-10-CM | POA: Diagnosis not present

## 2017-07-15 DIAGNOSIS — G308 Other Alzheimer's disease: Secondary | ICD-10-CM | POA: Diagnosis not present

## 2017-07-16 DIAGNOSIS — G308 Other Alzheimer's disease: Secondary | ICD-10-CM | POA: Diagnosis not present

## 2017-07-17 DIAGNOSIS — G308 Other Alzheimer's disease: Secondary | ICD-10-CM | POA: Diagnosis not present

## 2017-07-18 DIAGNOSIS — G308 Other Alzheimer's disease: Secondary | ICD-10-CM | POA: Diagnosis not present

## 2017-07-19 DIAGNOSIS — G308 Other Alzheimer's disease: Secondary | ICD-10-CM | POA: Diagnosis not present

## 2017-07-20 DIAGNOSIS — G308 Other Alzheimer's disease: Secondary | ICD-10-CM | POA: Diagnosis not present

## 2017-07-21 DIAGNOSIS — G308 Other Alzheimer's disease: Secondary | ICD-10-CM | POA: Diagnosis not present

## 2017-07-22 DIAGNOSIS — G308 Other Alzheimer's disease: Secondary | ICD-10-CM | POA: Diagnosis not present

## 2017-07-23 DIAGNOSIS — G308 Other Alzheimer's disease: Secondary | ICD-10-CM | POA: Diagnosis not present

## 2017-07-24 DIAGNOSIS — G308 Other Alzheimer's disease: Secondary | ICD-10-CM | POA: Diagnosis not present

## 2017-07-25 ENCOUNTER — Emergency Department: Payer: Medicare HMO

## 2017-07-25 ENCOUNTER — Other Ambulatory Visit: Payer: Self-pay

## 2017-07-25 ENCOUNTER — Encounter: Payer: Self-pay | Admitting: Emergency Medicine

## 2017-07-25 ENCOUNTER — Emergency Department
Admission: EM | Admit: 2017-07-25 | Discharge: 2017-07-25 | Disposition: A | Discharge status: Home / Self Care | Payer: Medicare HMO | Attending: Emergency Medicine | Admitting: Emergency Medicine

## 2017-07-25 DIAGNOSIS — S060X0A Concussion without loss of consciousness, initial encounter: Secondary | ICD-10-CM | POA: Diagnosis not present

## 2017-07-25 DIAGNOSIS — S199XXA Unspecified injury of neck, initial encounter: Secondary | ICD-10-CM | POA: Diagnosis not present

## 2017-07-25 DIAGNOSIS — W19XXXA Unspecified fall, initial encounter: Secondary | ICD-10-CM | POA: Insufficient documentation

## 2017-07-25 DIAGNOSIS — G308 Other Alzheimer's disease: Secondary | ICD-10-CM | POA: Insufficient documentation

## 2017-07-25 DIAGNOSIS — Y999 Unspecified external cause status: Secondary | ICD-10-CM | POA: Insufficient documentation

## 2017-07-25 DIAGNOSIS — Z79899 Other long term (current) drug therapy: Secondary | ICD-10-CM | POA: Insufficient documentation

## 2017-07-25 DIAGNOSIS — Z7982 Long term (current) use of aspirin: Secondary | ICD-10-CM | POA: Diagnosis not present

## 2017-07-25 DIAGNOSIS — S0990XA Unspecified injury of head, initial encounter: Secondary | ICD-10-CM | POA: Diagnosis not present

## 2017-07-25 DIAGNOSIS — F0280 Dementia in other diseases classified elsewhere without behavioral disturbance: Secondary | ICD-10-CM | POA: Insufficient documentation

## 2017-07-25 DIAGNOSIS — Z87891 Personal history of nicotine dependence: Secondary | ICD-10-CM | POA: Diagnosis not present

## 2017-07-25 DIAGNOSIS — Y939 Activity, unspecified: Secondary | ICD-10-CM | POA: Insufficient documentation

## 2017-07-25 DIAGNOSIS — I517 Cardiomegaly: Secondary | ICD-10-CM | POA: Diagnosis not present

## 2017-07-25 DIAGNOSIS — Y92129 Unspecified place in nursing home as the place of occurrence of the external cause: Secondary | ICD-10-CM | POA: Insufficient documentation

## 2017-07-25 LAB — CBC WITH DIFFERENTIAL/PLATELET
Basophils Absolute: 0.1 10*3/uL (ref 0–0.1)
Basophils Relative: 1 %
Eosinophils Absolute: 0.6 10*3/uL (ref 0–0.7)
Eosinophils Relative: 8 %
HEMATOCRIT: 31.2 % — AB (ref 35.0–47.0)
HEMOGLOBIN: 10.6 g/dL — AB (ref 12.0–16.0)
LYMPHS ABS: 2.2 10*3/uL (ref 1.0–3.6)
LYMPHS PCT: 27 %
MCH: 31.6 pg (ref 26.0–34.0)
MCHC: 33.9 g/dL (ref 32.0–36.0)
MCV: 93.1 fL (ref 80.0–100.0)
MONOS PCT: 14 %
Monocytes Absolute: 1.1 10*3/uL — ABNORMAL HIGH (ref 0.2–0.9)
NEUTROS PCT: 50 %
Neutro Abs: 4.2 10*3/uL (ref 1.4–6.5)
Platelets: 258 10*3/uL (ref 150–440)
RBC: 3.35 MIL/uL — AB (ref 3.80–5.20)
RDW: 15.4 % — ABNORMAL HIGH (ref 11.5–14.5)
WBC: 8.1 10*3/uL (ref 3.6–11.0)

## 2017-07-25 LAB — URINALYSIS, COMPLETE (UACMP) WITH MICROSCOPIC
BILIRUBIN URINE: NEGATIVE
Bacteria, UA: NONE SEEN
Glucose, UA: NEGATIVE mg/dL
HGB URINE DIPSTICK: NEGATIVE
Ketones, ur: NEGATIVE mg/dL
Leukocytes, UA: NEGATIVE
NITRITE: NEGATIVE
PH: 6 (ref 5.0–8.0)
Protein, ur: NEGATIVE mg/dL
SPECIFIC GRAVITY, URINE: 1.013 (ref 1.005–1.030)

## 2017-07-25 LAB — COMPREHENSIVE METABOLIC PANEL
ALK PHOS: 66 U/L (ref 38–126)
ALT: 19 U/L (ref 14–54)
ANION GAP: 7 (ref 5–15)
AST: 26 U/L (ref 15–41)
Albumin: 3.6 g/dL (ref 3.5–5.0)
BILIRUBIN TOTAL: 0.4 mg/dL (ref 0.3–1.2)
BUN: 15 mg/dL (ref 6–20)
CALCIUM: 9.1 mg/dL (ref 8.9–10.3)
CO2: 27 mmol/L (ref 22–32)
CREATININE: 0.66 mg/dL (ref 0.44–1.00)
Chloride: 102 mmol/L (ref 101–111)
Glucose, Bld: 104 mg/dL — ABNORMAL HIGH (ref 65–99)
Potassium: 4 mmol/L (ref 3.5–5.1)
Sodium: 136 mmol/L (ref 135–145)
TOTAL PROTEIN: 7.1 g/dL (ref 6.5–8.1)

## 2017-07-25 LAB — TROPONIN I

## 2017-07-25 MED ORDER — ACETAMINOPHEN 500 MG PO TABS: 1000.0000 mg | ORAL_TABLET | Freq: Four times a day (QID) | ORAL | 0 refills | Status: AC | PRN

## 2017-07-25 NOTE — ED Provider Notes (Signed)
[facility] Emergency Department Provider Note  ____________________________________________   First MD Initiated Contact with Patient 07/25/17 2131     (approximate)  I have reviewed the triage vital signs and the nursing notes.   HISTORY  Chief Complaint Fall  Level 5 exemption history limited by the patient's severe dementia  HPI Jamie Boyd is a 67 y.o. female who comes to the emergency department via EMS after apparently having an unwitnessed fall at her nursing home.  The patient herself has no complaints.  Apparently she was found on the ground with ecchymosis to the left side of her face.  She is currently laughing and joking with family.  Past Medical History:  Diagnosis Date  . Alzheimer disease   . Anemia   . Dementia   . Goiter   . Memory deficit   . Urinary tract infection, site not specified     Patient Active Problem List   Diagnosis Date Noted  . CVA (cerebral vascular accident) ([place]) 01/24/2017  . Dehydration 01/20/2017  . Failure to thrive in adult 01/20/2017  . Hypotension 01/20/2017  . Agitation 11/23/2015  . Moderate dementia, with behavioral disturbance 11/23/2015  . Gastroesophageal reflux disease without esophagitis 09/11/2015  . Iron deficiency anemia due to chronic blood loss 06/02/2015  . GERD without esophagitis 06/01/2015  . Difficulty sleeping 04/27/2015  . Moderate dementia without behavioral disturbance 03/01/2015  . Acute UTI 01/29/2015  . Anemia 01/29/2015  . Memory deficit 01/29/2015    Past Surgical History:  Procedure Laterality Date  . COLONOSCOPY WITH PROPOFOL N/A 02/18/2015   Procedure: COLONOSCOPY WITH PROPOFOL;  Surgeon: Hulen Luster, MD;  Location: [facility] ENDOSCOPY;  Service: Gastroenterology;  Laterality: N/A;  . ESOPHAGOGASTRODUODENOSCOPY N/A 02/18/2015   Procedure: ESOPHAGOGASTRODUODENOSCOPY (EGD);  Surgeon: Hulen Luster, MD;  Location: [facility] ENDOSCOPY;  Service: Gastroenterology;  Laterality: N/A;     Prior to Admission medications   Medication Sig Start Date End Date Taking? Authorizing Provider  acetaminophen (TYLENOL) 500 MG tablet Take 500 mg by mouth every 6 (six) hours as needed for mild pain.    [provider]  acetaminophen (TYLENOL) 500 MG tablet Take 2 tablets (1,000 mg total) by mouth every 6 (six) hours as needed. 07/25/17 07/25/18  Darel Hong, MD  aspirin EC 81 MG tablet Take 81 mg by mouth daily.    [provider]  atorvastatin (LIPITOR) 40 MG tablet Take 40 mg by mouth daily at 6 PM.    [provider]  Azelastine HCl 0.15 % SOLN Place 1 spray into the nose daily as needed (allergies).    [provider]  Azelastine HCl 137 MCG/SPRAY SOLN Place 1 spray into both nostrils 2 (two) times daily. Use in each nostril as directed     [provider]  diclofenac (VOLTAREN) 50 MG EC tablet Take 50 mg by mouth daily.    [provider]  divalproex (DEPAKOTE) 250 MG DR tablet Take 250 mg by mouth 3 (three) times daily.    [provider]  donepezil (ARICEPT) 10 MG tablet Take 10 mg by mouth at bedtime.    [provider]  doxycycline (VIBRA-TABS) 100 MG tablet Take 100 mg by mouth 2 (two) times daily. 04/14/17   [provider]  esomeprazole (NEXIUM) 40 MG capsule Take 40 mg by mouth daily as needed (acid reflux).    [provider]  ferrous sulfate 325 (65 FE) MG EC tablet Take 325 mg by mouth daily with breakfast.  [provider]  fexofenadine (ALLEGRA) 180 MG tablet Take 180 mg by mouth daily.    [provider]  glucosamine-chondroitin 500-400 MG tablet Take 1 tablet by mouth 3 (three) times daily.    [provider]  memantine (NAMENDA) 10 MG tablet Take 10 mg by mouth 2 (two) times daily.  11/23/15 11/27/17  [provider]  mirabegron ER (MYRBETRIQ) 50 MG TB24 tablet Take 1 tablet (50 mg total) by mouth daily. 03/19/17   Zara Council A, PA-C   montelukast (SINGULAIR) 10 MG tablet Take 10 mg by mouth at bedtime.    [provider]  Multiple Vitamins-Minerals (CENTRUM SILVER) tablet Take 1 tablet by mouth daily.    [provider]  nicotine polacrilex (COMMIT) 4 MG lozenge Take 4 mg by mouth as needed for smoking cessation.    [provider]  omega-3 acid ethyl esters (LOVAZA) 1 g capsule Take 1 g by mouth 2 (two) times daily.    [provider]  phenazopyridine (PYRIDIUM) 200 MG tablet Take 200 mg by mouth 3 (three) times daily with meals.    [provider]  QUEtiapine (SEROQUEL) 100 MG tablet Take 100 mg by mouth at bedtime.    [provider]  traZODone (DESYREL) 100 MG tablet Take 100 mg by mouth at bedtime.    [provider]  vitamin E 400 UNIT capsule Take 400 Units by mouth daily.    [provider]    Allergies Pollen extract  Family History  Problem Relation Age of Onset  . Diabetes Maternal Uncle   . Prostate cancer Neg Hx   . Bladder Cancer Neg Hx   . Kidney cancer Neg Hx     Social History Social History   Tobacco Use  . Smoking status: Former Smoker    Types: Cigarettes    Last attempt to quit: 01/25/2014    Years since quitting: 3.5  . Smokeless tobacco: Never Used  Substance Use Topics  . Alcohol use: No  . Drug use: No    Review of Systems Level 5 exemption history limited by the patient's dementia ____________________________________________   PHYSICAL EXAM:  VITAL SIGNS: ED Triage Vitals  Enc Vitals Group     BP 07/25/17 1957 116/87     Pulse Rate 07/25/17 1957 75     Resp 07/25/17 1957 16     Temp 07/25/17 1957 99.1 F (37.3 C)     Temp Source 07/25/17 1957 Oral     SpO2 07/25/17 1957 98 %     Weight 07/25/17 1956 150 lb (68 kg)     Height 07/25/17 1956 5\' 1"  (1.549 m)     Head Circumference --      Peak Flow --      Pain Score 07/25/17 2106 0     Pain Loc --      Pain Edu? --      Excl. in [place]? --      Constitutional: Pleasant cooperative giggling laughing joking Eyes: PERRL EOMI. midrange and brisk Head: Ecchymosis to left side of face. Nose: No congestion/rhinnorhea. Mouth/Throat: No trismus Neck: No stridor.   Cardiovascular: Normal rate, regular rhythm. Grossly normal heart sounds.  Good peripheral circulation. Respiratory: Normal respiratory effort.  No retractions. Lungs CTAB and moving good air Gastrointestinal: Soft nontender Musculoskeletal: No lower extremity edema   Neurologic:  Normal speech and language. No gross focal neurologic deficits are appreciated. Skin:  Skin is warm, dry and intact.  Facial bruising  as above Psychiatric: Very pleasantly demented    ____________________________________________   DIFFERENTIAL includes but not limited to  Chemical fall, dehydration, syncope, intracerebral hemorrhage ____________________________________________   LABS (all labs ordered are listed, but only abnormal results are displayed)  Labs Reviewed  CBC WITH DIFFERENTIAL/PLATELET - Abnormal; Notable for the following components:      Result Value   RBC 3.35 (*)    Hemoglobin 10.6 (*)    HCT 31.2 (*)    RDW 15.4 (*)    Monocytes Absolute 1.1 (*)    All other components within normal limits  COMPREHENSIVE METABOLIC PANEL - Abnormal; Notable for the following components:   Glucose, Bld 104 (*)    All other components within normal limits  URINALYSIS, COMPLETE (UACMP) WITH MICROSCOPIC - Abnormal; Notable for the following components:   Color, Urine YELLOW (*)    APPearance CLEAR (*)    All other components within normal limits  TROPONIN I    Lab work reviewed by me with no acute disease __________________________________________  EKG  ED ECG REPORT I, Darel Hong, the attending physician, personally viewed and interpreted this ECG.  Date: 07/27/2017 EKG Time:  Rate: 74 Rhythm: normal sinus rhythm QRS Axis: normal Intervals: normal ST/T Wave  abnormalities: normal Narrative Interpretation: no evidence of acute ischemia  ____________________________________________  RADIOLOGY  CT scan of the head and neck reviewed by me with no acute disease ____________________________________________   PROCEDURES  Procedure(s) performed: no  Procedures  Critical Care performed: no  Observation: no ____________________________________________   INITIAL IMPRESSION / ASSESSMENT AND PLAN / ED COURSE  Pertinent labs & imaging results that were available during my care of the patient were reviewed by me and considered in my medical decision making (see chart for details).  By the time I saw the patient she had already had lab work including urinalysis which were unremarkable.  EKG with no concerning signs for syncope and her CT scan of her head and neck are unremarkable as well.  She is currently at her baseline.  She is not taking warfarin.  I had a lengthy discussion with family at bedside and together we all agreed she was safe for discharge home.  Discharged home in improved condition.      ____________________________________________   FINAL CLINICAL IMPRESSION(S) / ED DIAGNOSES  Final diagnoses:  Concussion without loss of consciousness, initial encounter  Fall, initial encounter      NEW MEDICATIONS STARTED DURING THIS VISIT:  Discharge Medication List as of 07/25/2017  9:40 PM    START taking these medications   Details  !! acetaminophen (TYLENOL) 500 MG tablet Take 2 tablets (1,000 mg total) by mouth every 6 (six) hours as needed., Starting Wed 07/25/2017, Until Thu 07/25/2018, Print     !! - Potential duplicate medications found. Please discuss with provider.       Note:  This document was prepared using Dragon voice recognition software and may include unintentional dictation errors.     Darel Hong, MD 07/27/17 [phone]

## 2017-07-25 NOTE — Discharge Instructions (Signed)
It was a pleasure to take care of you today, and thank you for coming to our emergency department.  If you have any questions or concerns before leaving please ask the nurse to grab me and I'm more than happy to go through your aftercare instructions again.  If you were prescribed any opioid pain medication today such as Norco, Vicodin, Percocet, morphine, hydrocodone, or oxycodone please make sure you do not drive when you are taking this medication as it can alter your ability to drive safely.  If you have any concerns once you are home that you are not improving or are in fact getting worse before you can make it to your follow-up appointment, please do not hesitate to call 911 and come back for further evaluation.  Darel Hong, MD  Results for orders placed or performed during the hospital encounter of 07/25/17  CBC with Differential  Result Value Ref Range   WBC 8.1 3.6 - 11.0 K/uL   RBC 3.35 (L) 3.80 - 5.20 MIL/uL   Hemoglobin 10.6 (L) 12.0 - 16.0 g/dL   HCT 31.2 (L) 35.0 - 47.0 %   MCV 93.1 80.0 - 100.0 fL   MCH 31.6 26.0 - 34.0 pg   MCHC 33.9 32.0 - 36.0 g/dL   RDW 15.4 (H) 11.5 - 14.5 %   Platelets 258 150 - 440 K/uL   Neutrophils Relative % 50 %   Neutro Abs 4.2 1.4 - 6.5 K/uL   Lymphocytes Relative 27 %   Lymphs Abs 2.2 1.0 - 3.6 K/uL   Monocytes Relative 14 %   Monocytes Absolute 1.1 (H) 0.2 - 0.9 K/uL   Eosinophils Relative 8 %   Eosinophils Absolute 0.6 0 - 0.7 K/uL   Basophils Relative 1 %   Basophils Absolute 0.1 0 - 0.1 K/uL  Comprehensive metabolic panel  Result Value Ref Range   Sodium 136 135 - 145 mmol/L   Potassium 4.0 3.5 - 5.1 mmol/L   Chloride 102 101 - 111 mmol/L   CO2 27 22 - 32 mmol/L   Glucose, Bld 104 (H) 65 - 99 mg/dL   BUN 15 6 - 20 mg/dL   Creatinine, Ser 0.66 0.44 - 1.00 mg/dL   Calcium 9.1 8.9 - 10.3 mg/dL   Total Protein 7.1 6.5 - 8.1 g/dL   Albumin 3.6 3.5 - 5.0 g/dL   AST 26 15 - 41 U/L   ALT 19 14 - 54 U/L   Alkaline Phosphatase 66  38 - 126 U/L   Total Bilirubin 0.4 0.3 - 1.2 mg/dL   GFR calc non Af Amer >60 >60 mL/min   GFR calc Af Amer >60 >60 mL/min   Anion gap 7 5 - 15  Troponin I  Result Value Ref Range   Troponin I <0.03 <0.03 ng/mL  Urinalysis, Complete w Microscopic  Result Value Ref Range   Color, Urine YELLOW (A) YELLOW   APPearance CLEAR (A) CLEAR   Specific Gravity, Urine 1.013 1.005 - 1.030   pH 6.0 5.0 - 8.0   Glucose, UA NEGATIVE NEGATIVE mg/dL   Hgb urine dipstick NEGATIVE NEGATIVE   Bilirubin Urine NEGATIVE NEGATIVE   Ketones, ur NEGATIVE NEGATIVE mg/dL   Protein, ur NEGATIVE NEGATIVE mg/dL   Nitrite NEGATIVE NEGATIVE   Leukocytes, UA NEGATIVE NEGATIVE   RBC / HPF 0-5 0 - 5 RBC/hpf   WBC, UA 0-5 0 - 5 WBC/hpf   Bacteria, UA NONE SEEN NONE SEEN   Squamous Epithelial / LPF 6-10 0 -  5   Ct Head Wo Contrast  Result Date: 07/25/2017 CLINICAL DATA:  67 year old female status post unwitnessed fall. Left forehead hematoma. Dementia. EXAM: CT HEAD WITHOUT CONTRAST CT CERVICAL SPINE WITHOUT CONTRAST TECHNIQUE: Multidetector CT imaging of the head and cervical spine was performed following the standard protocol without intravenous contrast. Multiplanar CT image reconstructions of the cervical spine were also generated. COMPARISON:  Head CT without contrast 04/15/2017 and earlier. Brain MRI 01/24/2017. CTA head and neck 01/24/2017. FINDINGS: CT HEAD FINDINGS Brain: Stable cerebral volume loss with ex vacuo appearing ventricular enlargement. Confluent cerebral white matter hypodensity. Stable gray-white matter differentiation throughout the brain. No midline shift, mass effect, evidence of mass lesion, intracranial hemorrhage or evidence of cortically based acute infarction. No cortical encephalomalacia identified. Vascular: Calcified atherosclerosis at the skull base. No suspicious intracranial vascular hyperdensity. Skull: Stable and intact. Sinuses/Orbits: Visible paranasal sinus pneumatization is improved.  Tympanic cavities and mastoids remain clear. Other: Broad-based left anterior convexity scalp hematoma measures up to 6 millimeters in thickness. Underlying calvarium appears intact. Slightly separate superficial left supraorbital hematoma also measuring 7 millimeters on series 5, image 12. No underlying fracture. Other scalp soft tissues and visible orbits soft tissues appear negative. CT CERVICAL SPINE FINDINGS Alignment: Chronic straightening of cervical lordosis. Motion artifact at the C6-C7 level today. Cervical vertebral height and alignment appears to remain stable. Skull base and vertebrae: Visualized skull base is intact. No atlanto-occipital dissociation. No cervical spine fracture identified allowing for motion artifact in the lower cervical spine. Soft tissues and spinal canal: No prevertebral fluid or swelling. No visible canal hematoma. Neck soft tissues appears stable. Disc levels:  Stable cervical spine degeneration maximal at C5-C6. Upper chest: Visible upper thoracic levels appears stable and intact. Negative lung apices. IMPRESSION: 1. Intermittently motion degraded. 2. Left scalp soft tissue injury without underlying fracture. 3. No other acute traumatic injury identified in the head or cervical spine. 4.  Stable non contrast CT appearance of the brain. Electronically Signed   By: Genevie Ann M.D.   On: 07/25/2017 20:59   Ct Cervical Spine Wo Contrast  Result Date: 07/25/2017 CLINICAL DATA:  67 year old female status post unwitnessed fall. Left forehead hematoma. Dementia. EXAM: CT HEAD WITHOUT CONTRAST CT CERVICAL SPINE WITHOUT CONTRAST TECHNIQUE: Multidetector CT imaging of the head and cervical spine was performed following the standard protocol without intravenous contrast. Multiplanar CT image reconstructions of the cervical spine were also generated. COMPARISON:  Head CT without contrast 04/15/2017 and earlier. Brain MRI 01/24/2017. CTA head and neck 01/24/2017. FINDINGS: CT HEAD FINDINGS  Brain: Stable cerebral volume loss with ex vacuo appearing ventricular enlargement. Confluent cerebral white matter hypodensity. Stable gray-white matter differentiation throughout the brain. No midline shift, mass effect, evidence of mass lesion, intracranial hemorrhage or evidence of cortically based acute infarction. No cortical encephalomalacia identified. Vascular: Calcified atherosclerosis at the skull base. No suspicious intracranial vascular hyperdensity. Skull: Stable and intact. Sinuses/Orbits: Visible paranasal sinus pneumatization is improved. Tympanic cavities and mastoids remain clear. Other: Broad-based left anterior convexity scalp hematoma measures up to 6 millimeters in thickness. Underlying calvarium appears intact. Slightly separate superficial left supraorbital hematoma also measuring 7 millimeters on series 5, image 12. No underlying fracture. Other scalp soft tissues and visible orbits soft tissues appear negative. CT CERVICAL SPINE FINDINGS Alignment: Chronic straightening of cervical lordosis. Motion artifact at the C6-C7 level today. Cervical vertebral height and alignment appears to remain stable. Skull base and vertebrae: Visualized skull base is intact. No atlanto-occipital dissociation. No cervical spine  fracture identified allowing for motion artifact in the lower cervical spine. Soft tissues and spinal canal: No prevertebral fluid or swelling. No visible canal hematoma. Neck soft tissues appears stable. Disc levels:  Stable cervical spine degeneration maximal at C5-C6. Upper chest: Visible upper thoracic levels appears stable and intact. Negative lung apices. IMPRESSION: 1. Intermittently motion degraded. 2. Left scalp soft tissue injury without underlying fracture. 3. No other acute traumatic injury identified in the head or cervical spine. 4.  Stable non contrast CT appearance of the brain. Electronically Signed   By: Genevie Ann M.D.   On: 07/25/2017 20:59
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
## 2017-07-25 NOTE — ED Notes (Signed)
Reviewed discharge instructions, follow-up care, and prescriptions with patient, patient's family/POA, and facility rep. Patient's family/POA and facility rep verbalized understanding of all information reviewed. Patient stable, with no distress noted at this time.
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
## 2017-07-26 DIAGNOSIS — G308 Other Alzheimer's disease: Secondary | ICD-10-CM | POA: Diagnosis not present

## 2017-07-27 ENCOUNTER — Other Ambulatory Visit: Payer: Self-pay

## 2017-07-27 DIAGNOSIS — G308 Other Alzheimer's disease: Secondary | ICD-10-CM | POA: Diagnosis not present

## 2017-07-27 NOTE — Patient Outreach (Signed)
[facility]) Care Management  07/27/2017  Kristena Wilhelmi 1950-12-12 [id number]   Telephone Screen Referral Date : 07/26/2017 Referral Source:[facility] ED Census  Referral Reason: ED Utilization [place] attempt # 1 To patient.   Telephone call placed to [facility]. Spoke with Aleshia.  The patient is a resident at [place] in the [facility] care unit.  The patient has access to full case management services.  Plan: RN Health Coach will close the case at this time due to the patient is in enrolled  external services.   Lazaro Arms RN, BSN, [place] Direct Dial:  [phone] Fax: [phone]

## 2017-07-28 DIAGNOSIS — G308 Other Alzheimer's disease: Secondary | ICD-10-CM | POA: Diagnosis not present

## 2017-07-29 DIAGNOSIS — G308 Other Alzheimer's disease: Secondary | ICD-10-CM | POA: Diagnosis not present

## 2017-07-30 DIAGNOSIS — G308 Other Alzheimer's disease: Secondary | ICD-10-CM | POA: Diagnosis not present

## 2017-07-31 DIAGNOSIS — G308 Other Alzheimer's disease: Secondary | ICD-10-CM | POA: Diagnosis not present

## 2017-08-01 DIAGNOSIS — G308 Other Alzheimer's disease: Secondary | ICD-10-CM | POA: Diagnosis not present

## 2017-08-02 DIAGNOSIS — G308 Other Alzheimer's disease: Secondary | ICD-10-CM | POA: Diagnosis not present

## 2017-08-03 DIAGNOSIS — G308 Other Alzheimer's disease: Secondary | ICD-10-CM | POA: Diagnosis not present

## 2017-08-04 DIAGNOSIS — G308 Other Alzheimer's disease: Secondary | ICD-10-CM | POA: Diagnosis not present

## 2017-08-15 DIAGNOSIS — J302 Other seasonal allergic rhinitis: Secondary | ICD-10-CM | POA: Diagnosis not present

## 2017-08-15 DIAGNOSIS — R413 Other amnesia: Secondary | ICD-10-CM | POA: Diagnosis not present

## 2017-08-15 DIAGNOSIS — R600 Localized edema: Secondary | ICD-10-CM | POA: Diagnosis not present

## 2017-08-15 DIAGNOSIS — N3945 Continuous leakage: Secondary | ICD-10-CM | POA: Diagnosis not present

## 2017-08-15 DIAGNOSIS — D638 Anemia in other chronic diseases classified elsewhere: Secondary | ICD-10-CM | POA: Diagnosis not present

## 2017-08-15 DIAGNOSIS — Z23 Encounter for immunization: Secondary | ICD-10-CM | POA: Diagnosis not present

## 2017-08-23 DIAGNOSIS — G309 Alzheimer's disease, unspecified: Secondary | ICD-10-CM | POA: Diagnosis not present

## 2017-08-23 DIAGNOSIS — F0280 Dementia in other diseases classified elsewhere without behavioral disturbance: Secondary | ICD-10-CM | POA: Diagnosis not present

## 2017-08-23 DIAGNOSIS — Z7982 Long term (current) use of aspirin: Secondary | ICD-10-CM | POA: Diagnosis not present

## 2017-08-23 DIAGNOSIS — K219 Gastro-esophageal reflux disease without esophagitis: Secondary | ICD-10-CM | POA: Diagnosis not present

## 2017-08-23 DIAGNOSIS — N390 Urinary tract infection, site not specified: Secondary | ICD-10-CM | POA: Diagnosis not present

## 2017-08-23 DIAGNOSIS — R32 Unspecified urinary incontinence: Secondary | ICD-10-CM | POA: Diagnosis not present

## 2017-08-23 DIAGNOSIS — Z9181 History of falling: Secondary | ICD-10-CM | POA: Diagnosis not present

## 2017-08-23 DIAGNOSIS — D500 Iron deficiency anemia secondary to blood loss (chronic): Secondary | ICD-10-CM | POA: Diagnosis not present

## 2017-08-28 DIAGNOSIS — D500 Iron deficiency anemia secondary to blood loss (chronic): Secondary | ICD-10-CM | POA: Diagnosis not present

## 2017-08-28 DIAGNOSIS — R32 Unspecified urinary incontinence: Secondary | ICD-10-CM | POA: Diagnosis not present

## 2017-08-28 DIAGNOSIS — F0280 Dementia in other diseases classified elsewhere without behavioral disturbance: Secondary | ICD-10-CM | POA: Diagnosis not present

## 2017-08-28 DIAGNOSIS — N390 Urinary tract infection, site not specified: Secondary | ICD-10-CM | POA: Diagnosis not present

## 2017-08-28 DIAGNOSIS — K219 Gastro-esophageal reflux disease without esophagitis: Secondary | ICD-10-CM | POA: Diagnosis not present

## 2017-08-28 DIAGNOSIS — G309 Alzheimer's disease, unspecified: Secondary | ICD-10-CM | POA: Diagnosis not present

## 2017-08-28 DIAGNOSIS — Z9181 History of falling: Secondary | ICD-10-CM | POA: Diagnosis not present

## 2017-08-28 DIAGNOSIS — Z7982 Long term (current) use of aspirin: Secondary | ICD-10-CM | POA: Diagnosis not present

## 2017-08-29 DIAGNOSIS — Z9181 History of falling: Secondary | ICD-10-CM | POA: Diagnosis not present

## 2017-08-29 DIAGNOSIS — G300 Alzheimer's disease with early onset: Secondary | ICD-10-CM | POA: Diagnosis not present

## 2017-08-29 DIAGNOSIS — D649 Anemia, unspecified: Secondary | ICD-10-CM | POA: Diagnosis not present

## 2017-08-29 DIAGNOSIS — Z7982 Long term (current) use of aspirin: Secondary | ICD-10-CM | POA: Diagnosis not present

## 2017-08-29 DIAGNOSIS — G309 Alzheimer's disease, unspecified: Secondary | ICD-10-CM | POA: Diagnosis not present

## 2017-08-29 DIAGNOSIS — J302 Other seasonal allergic rhinitis: Secondary | ICD-10-CM | POA: Diagnosis not present

## 2017-08-29 DIAGNOSIS — K219 Gastro-esophageal reflux disease without esophagitis: Secondary | ICD-10-CM | POA: Diagnosis not present

## 2017-08-29 DIAGNOSIS — D500 Iron deficiency anemia secondary to blood loss (chronic): Secondary | ICD-10-CM | POA: Diagnosis not present

## 2017-08-29 DIAGNOSIS — R413 Other amnesia: Secondary | ICD-10-CM | POA: Diagnosis not present

## 2017-08-29 DIAGNOSIS — F0280 Dementia in other diseases classified elsewhere without behavioral disturbance: Secondary | ICD-10-CM | POA: Diagnosis not present

## 2017-08-29 DIAGNOSIS — R32 Unspecified urinary incontinence: Secondary | ICD-10-CM | POA: Diagnosis not present

## 2017-08-29 DIAGNOSIS — N390 Urinary tract infection, site not specified: Secondary | ICD-10-CM | POA: Diagnosis not present

## 2017-09-01 DIAGNOSIS — N390 Urinary tract infection, site not specified: Secondary | ICD-10-CM | POA: Diagnosis not present

## 2017-09-01 DIAGNOSIS — Z9181 History of falling: Secondary | ICD-10-CM | POA: Diagnosis not present

## 2017-09-01 DIAGNOSIS — G309 Alzheimer's disease, unspecified: Secondary | ICD-10-CM | POA: Diagnosis not present

## 2017-09-01 DIAGNOSIS — Z7982 Long term (current) use of aspirin: Secondary | ICD-10-CM | POA: Diagnosis not present

## 2017-09-01 DIAGNOSIS — F0280 Dementia in other diseases classified elsewhere without behavioral disturbance: Secondary | ICD-10-CM | POA: Diagnosis not present

## 2017-09-01 DIAGNOSIS — D500 Iron deficiency anemia secondary to blood loss (chronic): Secondary | ICD-10-CM | POA: Diagnosis not present

## 2017-09-01 DIAGNOSIS — K219 Gastro-esophageal reflux disease without esophagitis: Secondary | ICD-10-CM | POA: Diagnosis not present

## 2017-09-01 DIAGNOSIS — R32 Unspecified urinary incontinence: Secondary | ICD-10-CM | POA: Diagnosis not present

## 2017-09-03 DIAGNOSIS — D500 Iron deficiency anemia secondary to blood loss (chronic): Secondary | ICD-10-CM | POA: Diagnosis not present

## 2017-09-03 DIAGNOSIS — G309 Alzheimer's disease, unspecified: Secondary | ICD-10-CM | POA: Diagnosis not present

## 2017-09-03 DIAGNOSIS — R32 Unspecified urinary incontinence: Secondary | ICD-10-CM | POA: Diagnosis not present

## 2017-09-03 DIAGNOSIS — Z9181 History of falling: Secondary | ICD-10-CM | POA: Diagnosis not present

## 2017-09-03 DIAGNOSIS — K219 Gastro-esophageal reflux disease without esophagitis: Secondary | ICD-10-CM | POA: Diagnosis not present

## 2017-09-03 DIAGNOSIS — F0280 Dementia in other diseases classified elsewhere without behavioral disturbance: Secondary | ICD-10-CM | POA: Diagnosis not present

## 2017-09-03 DIAGNOSIS — N390 Urinary tract infection, site not specified: Secondary | ICD-10-CM | POA: Diagnosis not present

## 2017-09-03 DIAGNOSIS — Z7982 Long term (current) use of aspirin: Secondary | ICD-10-CM | POA: Diagnosis not present

## 2017-09-04 DIAGNOSIS — R32 Unspecified urinary incontinence: Secondary | ICD-10-CM | POA: Diagnosis not present

## 2017-09-04 DIAGNOSIS — G309 Alzheimer's disease, unspecified: Secondary | ICD-10-CM | POA: Diagnosis not present

## 2017-09-04 DIAGNOSIS — Z9181 History of falling: Secondary | ICD-10-CM | POA: Diagnosis not present

## 2017-09-04 DIAGNOSIS — F0280 Dementia in other diseases classified elsewhere without behavioral disturbance: Secondary | ICD-10-CM | POA: Diagnosis not present

## 2017-09-04 DIAGNOSIS — Z7982 Long term (current) use of aspirin: Secondary | ICD-10-CM | POA: Diagnosis not present

## 2017-09-04 DIAGNOSIS — D500 Iron deficiency anemia secondary to blood loss (chronic): Secondary | ICD-10-CM | POA: Diagnosis not present

## 2017-09-04 DIAGNOSIS — K219 Gastro-esophageal reflux disease without esophagitis: Secondary | ICD-10-CM | POA: Diagnosis not present

## 2017-09-04 DIAGNOSIS — N390 Urinary tract infection, site not specified: Secondary | ICD-10-CM | POA: Diagnosis not present

## 2017-09-05 DIAGNOSIS — Z9181 History of falling: Secondary | ICD-10-CM | POA: Diagnosis not present

## 2017-09-05 DIAGNOSIS — R32 Unspecified urinary incontinence: Secondary | ICD-10-CM | POA: Diagnosis not present

## 2017-09-05 DIAGNOSIS — F0280 Dementia in other diseases classified elsewhere without behavioral disturbance: Secondary | ICD-10-CM | POA: Diagnosis not present

## 2017-09-05 DIAGNOSIS — K219 Gastro-esophageal reflux disease without esophagitis: Secondary | ICD-10-CM | POA: Diagnosis not present

## 2017-09-05 DIAGNOSIS — D500 Iron deficiency anemia secondary to blood loss (chronic): Secondary | ICD-10-CM | POA: Diagnosis not present

## 2017-09-05 DIAGNOSIS — Z7982 Long term (current) use of aspirin: Secondary | ICD-10-CM | POA: Diagnosis not present

## 2017-09-05 DIAGNOSIS — G309 Alzheimer's disease, unspecified: Secondary | ICD-10-CM | POA: Diagnosis not present

## 2017-09-05 DIAGNOSIS — N390 Urinary tract infection, site not specified: Secondary | ICD-10-CM | POA: Diagnosis not present

## 2017-09-06 DIAGNOSIS — G309 Alzheimer's disease, unspecified: Secondary | ICD-10-CM | POA: Diagnosis not present

## 2017-09-06 DIAGNOSIS — D500 Iron deficiency anemia secondary to blood loss (chronic): Secondary | ICD-10-CM | POA: Diagnosis not present

## 2017-09-06 DIAGNOSIS — K219 Gastro-esophageal reflux disease without esophagitis: Secondary | ICD-10-CM | POA: Diagnosis not present

## 2017-09-06 DIAGNOSIS — Z7982 Long term (current) use of aspirin: Secondary | ICD-10-CM | POA: Diagnosis not present

## 2017-09-06 DIAGNOSIS — R32 Unspecified urinary incontinence: Secondary | ICD-10-CM | POA: Diagnosis not present

## 2017-09-06 DIAGNOSIS — Z9181 History of falling: Secondary | ICD-10-CM | POA: Diagnosis not present

## 2017-09-06 DIAGNOSIS — F0280 Dementia in other diseases classified elsewhere without behavioral disturbance: Secondary | ICD-10-CM | POA: Diagnosis not present

## 2017-09-06 DIAGNOSIS — N390 Urinary tract infection, site not specified: Secondary | ICD-10-CM | POA: Diagnosis not present

## 2017-09-07 DIAGNOSIS — N390 Urinary tract infection, site not specified: Secondary | ICD-10-CM | POA: Diagnosis not present

## 2017-09-07 DIAGNOSIS — F0280 Dementia in other diseases classified elsewhere without behavioral disturbance: Secondary | ICD-10-CM | POA: Diagnosis not present

## 2017-09-07 DIAGNOSIS — G309 Alzheimer's disease, unspecified: Secondary | ICD-10-CM | POA: Diagnosis not present

## 2017-09-07 DIAGNOSIS — K219 Gastro-esophageal reflux disease without esophagitis: Secondary | ICD-10-CM | POA: Diagnosis not present

## 2017-09-07 DIAGNOSIS — Z9181 History of falling: Secondary | ICD-10-CM | POA: Diagnosis not present

## 2017-09-07 DIAGNOSIS — Z7982 Long term (current) use of aspirin: Secondary | ICD-10-CM | POA: Diagnosis not present

## 2017-09-07 DIAGNOSIS — R32 Unspecified urinary incontinence: Secondary | ICD-10-CM | POA: Diagnosis not present

## 2017-09-07 DIAGNOSIS — D500 Iron deficiency anemia secondary to blood loss (chronic): Secondary | ICD-10-CM | POA: Diagnosis not present

## 2017-09-10 ENCOUNTER — Emergency Department
Admission: EM | Admit: 2017-09-10 | Discharge: 2017-09-11 | Disposition: A | Discharge status: Home / Self Care | Payer: Medicare HMO | Attending: Student in an Organized Health Care Education/Training Program | Admitting: Student in an Organized Health Care Education/Training Program

## 2017-09-10 ENCOUNTER — Emergency Department: Payer: Medicare HMO

## 2017-09-10 ENCOUNTER — Other Ambulatory Visit: Payer: Self-pay

## 2017-09-10 DIAGNOSIS — W19XXXA Unspecified fall, initial encounter: Secondary | ICD-10-CM | POA: Diagnosis not present

## 2017-09-10 DIAGNOSIS — S065X0A Traumatic subdural hemorrhage without loss of consciousness, initial encounter: Secondary | ICD-10-CM | POA: Diagnosis not present

## 2017-09-10 DIAGNOSIS — Z7982 Long term (current) use of aspirin: Secondary | ICD-10-CM | POA: Insufficient documentation

## 2017-09-10 DIAGNOSIS — W01190A Fall on same level from slipping, tripping and stumbling with subsequent striking against furniture, initial encounter: Secondary | ICD-10-CM | POA: Diagnosis not present

## 2017-09-10 DIAGNOSIS — Y999 Unspecified external cause status: Secondary | ICD-10-CM | POA: Diagnosis not present

## 2017-09-10 DIAGNOSIS — G309 Alzheimer's disease, unspecified: Secondary | ICD-10-CM | POA: Diagnosis not present

## 2017-09-10 DIAGNOSIS — Y929 Unspecified place or not applicable: Secondary | ICD-10-CM | POA: Insufficient documentation

## 2017-09-10 DIAGNOSIS — Z79899 Other long term (current) drug therapy: Secondary | ICD-10-CM | POA: Insufficient documentation

## 2017-09-10 DIAGNOSIS — S199XXA Unspecified injury of neck, initial encounter: Secondary | ICD-10-CM | POA: Diagnosis not present

## 2017-09-10 DIAGNOSIS — Z9181 History of falling: Secondary | ICD-10-CM | POA: Diagnosis not present

## 2017-09-10 DIAGNOSIS — R32 Unspecified urinary incontinence: Secondary | ICD-10-CM | POA: Diagnosis not present

## 2017-09-10 DIAGNOSIS — Z87891 Personal history of nicotine dependence: Secondary | ICD-10-CM | POA: Insufficient documentation

## 2017-09-10 DIAGNOSIS — S065X9A Traumatic subdural hemorrhage with loss of consciousness of unspecified duration, initial encounter: Secondary | ICD-10-CM

## 2017-09-10 DIAGNOSIS — D500 Iron deficiency anemia secondary to blood loss (chronic): Secondary | ICD-10-CM | POA: Diagnosis not present

## 2017-09-10 DIAGNOSIS — K219 Gastro-esophageal reflux disease without esophagitis: Secondary | ICD-10-CM | POA: Diagnosis not present

## 2017-09-10 DIAGNOSIS — Z8673 Personal history of transient ischemic attack (TIA), and cerebral infarction without residual deficits: Secondary | ICD-10-CM | POA: Diagnosis not present

## 2017-09-10 DIAGNOSIS — Y939 Activity, unspecified: Secondary | ICD-10-CM | POA: Diagnosis not present

## 2017-09-10 DIAGNOSIS — S065XAA Traumatic subdural hemorrhage with loss of consciousness status unknown, initial encounter: Secondary | ICD-10-CM

## 2017-09-10 DIAGNOSIS — N390 Urinary tract infection, site not specified: Secondary | ICD-10-CM | POA: Diagnosis not present

## 2017-09-10 DIAGNOSIS — F0280 Dementia in other diseases classified elsewhere without behavioral disturbance: Secondary | ICD-10-CM | POA: Diagnosis not present

## 2017-09-10 DIAGNOSIS — I6200 Nontraumatic subdural hemorrhage, unspecified: Secondary | ICD-10-CM | POA: Diagnosis not present

## 2017-09-10 DIAGNOSIS — S0990XA Unspecified injury of head, initial encounter: Secondary | ICD-10-CM | POA: Diagnosis not present

## 2017-09-10 LAB — CBC WITH DIFFERENTIAL/PLATELET
BASOS PCT: 1 %
Basophils Absolute: 0.1 10*3/uL (ref 0–0.1)
EOS PCT: 14 %
Eosinophils Absolute: 1.5 10*3/uL — ABNORMAL HIGH (ref 0–0.7)
HCT: 34.9 % — ABNORMAL LOW (ref 35.0–47.0)
Hemoglobin: 11.8 g/dL — ABNORMAL LOW (ref 12.0–16.0)
Lymphocytes Relative: 16 %
Lymphs Abs: 1.7 10*3/uL (ref 1.0–3.6)
MCH: 31 pg (ref 26.0–34.0)
MCHC: 33.8 g/dL (ref 32.0–36.0)
MCV: 91.8 fL (ref 80.0–100.0)
MONO ABS: 1.2 10*3/uL — AB (ref 0.2–0.9)
MONOS PCT: 12 %
Neutro Abs: 6.1 10*3/uL (ref 1.4–6.5)
Neutrophils Relative %: 57 %
PLATELETS: 273 10*3/uL (ref 150–440)
RBC: 3.8 MIL/uL (ref 3.80–5.20)
RDW: 14.5 % (ref 11.5–14.5)
WBC: 10.6 10*3/uL (ref 3.6–11.0)

## 2017-09-10 LAB — BASIC METABOLIC PANEL
Anion gap: 9 (ref 5–15)
BUN: 22 mg/dL (ref 8–23)
CALCIUM: 9.4 mg/dL (ref 8.9–10.3)
CO2: 31 mmol/L (ref 22–32)
CREATININE: 1.18 mg/dL — AB (ref 0.44–1.00)
Chloride: 100 mmol/L (ref 98–111)
GFR calc non Af Amer: 46 mL/min — ABNORMAL LOW (ref >60)
GFR, EST AFRICAN AMERICAN: 54 mL/min — AB (ref >60)
GLUCOSE: 98 mg/dL (ref 70–99)
Potassium: 3.7 mmol/L (ref 3.5–5.1)
Sodium: 140 mmol/L (ref 135–145)

## 2017-09-10 LAB — PROTIME-INR
INR: 0.96
Prothrombin Time: 12.7 seconds (ref 11.4–15.2)

## 2017-09-10 NOTE — ED Notes (Signed)
Gauze wrapped placed around pt iv to prevent pt from pulling at IV.

## 2017-09-10 NOTE — Discharge Instructions (Signed)
Stop taking aspirin.  Follow-up with your primary doctor as well as for referral to Dr. Lacinda Axon of neurosurgery.  Return for any additional questions or concerns.  Tylenol is appropriate and safe for headaches.

## 2017-09-10 NOTE — ED Notes (Signed)
Patient transported to CT 

## 2017-09-10 NOTE — ED Triage Notes (Signed)
Pt arrives from home via ems with report of fall. Ems states mechanical fall, pt tripped over feet hitting head on corner of desk. On arrival pt has obvious hematoma to left side of forehead, c-collar in place on arrival. NAD distress at this time.

## 2017-09-10 NOTE — ED Provider Notes (Signed)
[facility] Emergency Department Provider Note    First MD Initiated Contact with Patient 09/10/17 1513     (approximate)  I have reviewed the triage vital signs and the nursing notes.   HISTORY  Chief Complaint Fall  Level V Caveat:  dementia  HPI Jamie Boyd is a 67 y.o. female history of dementia presents to the ER after mechanical fall after she tripped over her feet while walking in her office and hit her forehead on the desk.  Uncertain of LOC.  Denies any neck pain.  Only complaining of frontal headache.  Denies any chest pain or shortness of breath.  No other injuries noted.  No reported blood thinners.    Past Medical History:  Diagnosis Date  . Alzheimer disease   . Anemia   . Dementia   . Goiter   . Memory deficit   . Urinary tract infection, site not specified    Family History  Problem Relation Age of Onset  . Diabetes Maternal Uncle   . Prostate cancer Neg Hx   . Bladder Cancer Neg Hx   . Kidney cancer Neg Hx    Past Surgical History:  Procedure Laterality Date  . COLONOSCOPY WITH PROPOFOL N/A 02/18/2015   Procedure: COLONOSCOPY WITH PROPOFOL;  Surgeon: Hulen Luster, MD;  Location: [facility] ENDOSCOPY;  Service: Gastroenterology;  Laterality: N/A;  . ESOPHAGOGASTRODUODENOSCOPY N/A 02/18/2015   Procedure: ESOPHAGOGASTRODUODENOSCOPY (EGD);  Surgeon: Hulen Luster, MD;  Location: [facility] ENDOSCOPY;  Service: Gastroenterology;  Laterality: N/A;   Patient Active Problem List   Diagnosis Date Noted  . CVA (cerebral vascular accident) ([place]) 01/24/2017  . Dehydration 01/20/2017  . Failure to thrive in adult 01/20/2017  . Hypotension 01/20/2017  . Agitation 11/23/2015  . Moderate dementia, with behavioral disturbance 11/23/2015  . Gastroesophageal reflux disease without esophagitis 09/11/2015  . Iron deficiency anemia due to chronic blood loss 06/02/2015  . GERD without esophagitis 06/01/2015  . Difficulty sleeping 04/27/2015  . Moderate dementia  without behavioral disturbance 03/01/2015  . Acute UTI 01/29/2015  . Anemia 01/29/2015  . Memory deficit 01/29/2015      Prior to Admission medications   Medication Sig Start Date End Date Taking? Authorizing Provider  acetaminophen (TYLENOL) 500 MG tablet Take 500 mg by mouth every 6 (six) hours as needed for mild pain.    [provider]  acetaminophen (TYLENOL) 500 MG tablet Take 2 tablets (1,000 mg total) by mouth every 6 (six) hours as needed. 07/25/17 07/25/18  Darel Hong, MD  aspirin EC 81 MG tablet Take 81 mg by mouth daily.    [provider]  atorvastatin (LIPITOR) 40 MG tablet Take 40 mg by mouth daily at 6 PM.    [provider]  Azelastine HCl 0.15 % SOLN Place 1 spray into the nose daily as needed (allergies).    [provider]  Azelastine HCl 137 MCG/SPRAY SOLN Place 1 spray into both nostrils 2 (two) times daily. Use in each nostril as directed     [provider]  diclofenac (VOLTAREN) 50 MG EC tablet Take 50 mg by mouth daily.    [provider]  divalproex (DEPAKOTE) 250 MG DR tablet Take 250 mg by mouth 3 (three) times daily.    [provider]  donepezil (ARICEPT) 10 MG tablet Take 10 mg by mouth at bedtime.    [provider]  doxycycline (VIBRA-TABS) 100 MG tablet Take 100 mg by mouth 2 (two) times daily. 04/14/17  [provider]  esomeprazole (NEXIUM) 40 MG capsule Take 40 mg by mouth daily as needed (acid reflux).    [provider]  ferrous sulfate 325 (65 FE) MG EC tablet Take 325 mg by mouth daily with breakfast.    [provider]  fexofenadine (ALLEGRA) 180 MG tablet Take 180 mg by mouth daily.    [provider]  glucosamine-chondroitin 500-400 MG tablet Take 1 tablet by mouth 3 (three) times daily.    [provider]  memantine (NAMENDA) 10 MG tablet Take 10 mg by mouth 2 (two) times daily.  11/23/15 11/27/17  [provider]    mirabegron ER (MYRBETRIQ) 50 MG TB24 tablet Take 1 tablet (50 mg total) by mouth daily. 03/19/17   Zara Council A, PA-C  montelukast (SINGULAIR) 10 MG tablet Take 10 mg by mouth at bedtime.    [provider]  Multiple Vitamins-Minerals (CENTRUM SILVER) tablet Take 1 tablet by mouth daily.    [provider]  nicotine polacrilex (COMMIT) 4 MG lozenge Take 4 mg by mouth as needed for smoking cessation.    [provider]  omega-3 acid ethyl esters (LOVAZA) 1 g capsule Take 1 g by mouth 2 (two) times daily.    [provider]  phenazopyridine (PYRIDIUM) 200 MG tablet Take 200 mg by mouth 3 (three) times daily with meals.    [provider]  QUEtiapine (SEROQUEL) 100 MG tablet Take 100 mg by mouth at bedtime.    [provider]  traZODone (DESYREL) 100 MG tablet Take 100 mg by mouth at bedtime.    [provider]  vitamin E 400 UNIT capsule Take 400 Units by mouth daily.    [provider]    Allergies Pollen extract    Social History Social History   Tobacco Use  . Smoking status: Former Smoker    Types: Cigarettes    Last attempt to quit: 01/25/2014    Years since quitting: 3.6  . Smokeless tobacco: Never Used  Substance Use Topics  . Alcohol use: No  . Drug use: No    Review of Systems Patient denies headaches, rhinorrhea, blurry vision, numbness, shortness of breath, chest pain, edema, cough, abdominal pain, nausea, vomiting, diarrhea, dysuria, fevers, rashes or hallucinations unless otherwise stated above in HPI. ____________________________________________   PHYSICAL EXAM:  VITAL SIGNS: Vitals:   09/10/17 2030 09/10/17 2100  BP: 122/68 103/67  Pulse:  74  Resp:  16  Temp:    SpO2:  100%    Constitutional: Alert, pleasant, in NAD in C-collar  Eyes: Conjunctivae are normal.  Head: large left frontal contusion and swelling, no laceration, small superficial abrasion <1cm Nose: No  congestion/rhinnorhea. Mouth/Throat: Mucous membranes are moist.   Neck: No stridor. Painless ROM.  Cardiovascular: Normal rate, regular rhythm. Grossly normal heart sounds.  Good peripheral circulation. Respiratory: Normal respiratory effort.  No retractions. Lungs CTAB. Gastrointestinal: Soft and nontender. No distention. No abdominal bruits. No CVA tenderness. Genitourinary: deferred Musculoskeletal: No lower extremity tenderness nor edema.  No joint effusions. Neurologic:  Normal speech and language. No gross focal neurologic deficits are appreciated. No facial droop Skin:  Skin is warm, dry and intact. No rash noted. Psychiatric: appropriate ____________________________________________   LABS (all labs ordered are listed, but only abnormal results are displayed)  Results for orders placed or performed during the hospital encounter of 09/10/17 (from the past 24 hour(s))  CBC with Differential/Platelet     Status: Abnormal   Collection Time:  09/10/17  3:57 PM  Result Value Ref Range   WBC 10.6 3.6 - 11.0 K/uL   RBC 3.80 3.80 - 5.20 MIL/uL   Hemoglobin 11.8 (L) 12.0 - 16.0 g/dL   HCT 34.9 (L) 35.0 - 47.0 %   MCV 91.8 80.0 - 100.0 fL   MCH 31.0 26.0 - 34.0 pg   MCHC 33.8 32.0 - 36.0 g/dL   RDW 14.5 11.5 - 14.5 %   Platelets 273 150 - 440 K/uL   Neutrophils Relative % 57 %   Neutro Abs 6.1 1.4 - 6.5 K/uL   Lymphocytes Relative 16 %   Lymphs Abs 1.7 1.0 - 3.6 K/uL   Monocytes Relative 12 %   Monocytes Absolute 1.2 (H) 0.2 - 0.9 K/uL   Eosinophils Relative 14 %   Eosinophils Absolute 1.5 (H) 0 - 0.7 K/uL   Basophils Relative 1 %   Basophils Absolute 0.1 0 - 0.1 K/uL  Basic metabolic panel     Status: Abnormal   Collection Time: 09/10/17  3:57 PM  Result Value Ref Range   Sodium 140 135 - 145 mmol/L   Potassium 3.7 3.5 - 5.1 mmol/L   Chloride 100 98 - 111 mmol/L   CO2 31 22 - 32 mmol/L   Glucose, Bld 98 70 - 99 mg/dL   BUN 22 8 - 23 mg/dL   Creatinine, Ser 1.18 (H) 0.44 -  1.00 mg/dL   Calcium 9.4 8.9 - 10.3 mg/dL   GFR calc non Af Amer 46 (L) >60 mL/min   GFR calc Af Amer 54 (L) >60 mL/min   Anion gap 9 5 - 15  Protime-INR     Status: None   Collection Time: 09/10/17  3:57 PM  Result Value Ref Range   Prothrombin Time 12.7 11.4 - 15.2 seconds   INR 0.96    ____________________________________________  EKG My review and personal interpretation at Time: 15:13   Indication: fall  Rate: 70  Rhythm: sinus Axis: normal Other: normal intervals, no stemi,  ____________________________________________  RADIOLOGY  I personally reviewed all radiographic images ordered to evaluate for the above acute complaints and reviewed radiology reports and findings.  These findings were personally discussed with the patient.  Please see medical record for radiology report.  ____________________________________________   PROCEDURES  Procedure(s) performed:  Procedures    Critical Care performed: yes ____________________________________________   INITIAL IMPRESSION / ASSESSMENT AND PLAN / ED COURSE  Pertinent labs & imaging results that were available during my care of the patient were reviewed by me and considered in my medical decision making (see chart for details).   DDX: sdh, iph, concussion, uti, dysrhythmia  Aviya Jarvie is a 67 y.o. who presents to the ED with fall as described above.  She is afebrile Heema dynamically stable.  Will check blood work.  Will order CT imaging for the above differential.  Clinical Course as of Sep 10 2305  [facility] Sep 10, 2017  1552 Patient with evidence of small 6 mm acute right tentorial subdural hematoma without mass-effect.  No evidence of cervical spine fracture.  Will consult neurosurgery.   [PR]  5956 I discussed the case CT imaging results with the patient and family and POA at bedside.  States that the patient's goals of care primarily comfort she would not want to be placed on "life support "I would not want any  invasive procedures.  Goals of care will be comfort.  Based on this to do anticipate patient will be appropriate  for repeat CT imaging of the head here in the ER after observation.   [PR]  1636 Discussed case with Dr. Lacinda Axon of neurosurgery who agrees with plan for repeat CT head at 6 hours.  If there is any evidence of expansion patient will be transferred to [facility] for further neuro exam and neurosurgical management.  Family updated at baseline.  Patient remains hemodynamically stable.   [PR]  2305 CT imaging shows stabilization of the subdural hematoma.  Patient in no acute distress.  I do believe she stable and appropriate for discharge home.  Will be given follow up with Nsgy and advised to stop taking aspirin.   [PR]    Clinical Course User Index [PR] Merlyn Lot, MD     As part of my medical decision making, I reviewed the following data within the [place] notes reviewed and incorporated, Labs reviewed, notes from prior ED visits  ____________________________________________   FINAL CLINICAL IMPRESSION(S) / ED DIAGNOSES  Final diagnoses:  Subdural hematoma ([place])      NEW MEDICATIONS STARTED DURING THIS VISIT:  New Prescriptions   No medications on file     Note:  This document was prepared using Dragon voice recognition software and may include unintentional dictation errors.    Merlyn Lot, MD 09/10/17 [phone]

## 2017-09-13 ENCOUNTER — Other Ambulatory Visit: Payer: Self-pay

## 2017-09-13 DIAGNOSIS — Z7982 Long term (current) use of aspirin: Secondary | ICD-10-CM | POA: Diagnosis not present

## 2017-09-13 DIAGNOSIS — F0280 Dementia in other diseases classified elsewhere without behavioral disturbance: Secondary | ICD-10-CM | POA: Diagnosis not present

## 2017-09-13 DIAGNOSIS — Z9181 History of falling: Secondary | ICD-10-CM | POA: Diagnosis not present

## 2017-09-13 DIAGNOSIS — D500 Iron deficiency anemia secondary to blood loss (chronic): Secondary | ICD-10-CM | POA: Diagnosis not present

## 2017-09-13 DIAGNOSIS — K219 Gastro-esophageal reflux disease without esophagitis: Secondary | ICD-10-CM | POA: Diagnosis not present

## 2017-09-13 DIAGNOSIS — N390 Urinary tract infection, site not specified: Secondary | ICD-10-CM | POA: Diagnosis not present

## 2017-09-13 DIAGNOSIS — G309 Alzheimer's disease, unspecified: Secondary | ICD-10-CM | POA: Diagnosis not present

## 2017-09-13 DIAGNOSIS — R32 Unspecified urinary incontinence: Secondary | ICD-10-CM | POA: Diagnosis not present

## 2017-09-13 NOTE — Patient Outreach (Signed)
[facility]) Care Management  09/13/2017  Casimira Sutphin 04-09-1950 [id number]    Telephone Screen Referral Date : 09/12/2017  Referral Source: [facility] ED Census Referral Reason: ED Utilization Insurance: Medicare    Outreach attempt # 1To patient. Spoke with Danielle Dess (on DPR ) HIPAA verified. Mrs. Gorden Harms states that the patient is no longer at the [facility].  She is living at home with them. Verified primary care. Began to give the caregiver information about [facility] services and she stated that they have well care coming to the home and did not want to complete the screening.  I offered to send her a pamphlet about the program and to give [place] a call if services are needed in the future. She verbalized understanding.  Plan: RN Health Coach will close the case at this time.   Lazaro Arms RN, BSN, [place] Direct Dial:  [phone] Fax: [phone]

## 2017-09-14 DIAGNOSIS — K219 Gastro-esophageal reflux disease without esophagitis: Secondary | ICD-10-CM | POA: Diagnosis not present

## 2017-09-14 DIAGNOSIS — D500 Iron deficiency anemia secondary to blood loss (chronic): Secondary | ICD-10-CM | POA: Diagnosis not present

## 2017-09-14 DIAGNOSIS — N390 Urinary tract infection, site not specified: Secondary | ICD-10-CM | POA: Diagnosis not present

## 2017-09-14 DIAGNOSIS — G309 Alzheimer's disease, unspecified: Secondary | ICD-10-CM | POA: Diagnosis not present

## 2017-09-14 DIAGNOSIS — F0280 Dementia in other diseases classified elsewhere without behavioral disturbance: Secondary | ICD-10-CM | POA: Diagnosis not present

## 2017-09-14 DIAGNOSIS — R32 Unspecified urinary incontinence: Secondary | ICD-10-CM | POA: Diagnosis not present

## 2017-09-14 DIAGNOSIS — Z9181 History of falling: Secondary | ICD-10-CM | POA: Diagnosis not present

## 2017-09-14 DIAGNOSIS — Z7982 Long term (current) use of aspirin: Secondary | ICD-10-CM | POA: Diagnosis not present

## 2017-09-17 DIAGNOSIS — Z9181 History of falling: Secondary | ICD-10-CM | POA: Diagnosis not present

## 2017-09-17 DIAGNOSIS — K219 Gastro-esophageal reflux disease without esophagitis: Secondary | ICD-10-CM | POA: Diagnosis not present

## 2017-09-17 DIAGNOSIS — G309 Alzheimer's disease, unspecified: Secondary | ICD-10-CM | POA: Diagnosis not present

## 2017-09-17 DIAGNOSIS — N390 Urinary tract infection, site not specified: Secondary | ICD-10-CM | POA: Diagnosis not present

## 2017-09-17 DIAGNOSIS — D500 Iron deficiency anemia secondary to blood loss (chronic): Secondary | ICD-10-CM | POA: Diagnosis not present

## 2017-09-17 DIAGNOSIS — Z7982 Long term (current) use of aspirin: Secondary | ICD-10-CM | POA: Diagnosis not present

## 2017-09-17 DIAGNOSIS — R32 Unspecified urinary incontinence: Secondary | ICD-10-CM | POA: Diagnosis not present

## 2017-09-17 DIAGNOSIS — F0280 Dementia in other diseases classified elsewhere without behavioral disturbance: Secondary | ICD-10-CM | POA: Diagnosis not present

## 2017-09-18 DIAGNOSIS — S065X9A Traumatic subdural hemorrhage with loss of consciousness of unspecified duration, initial encounter: Secondary | ICD-10-CM | POA: Diagnosis not present

## 2017-09-19 DIAGNOSIS — Z9181 History of falling: Secondary | ICD-10-CM | POA: Diagnosis not present

## 2017-09-19 DIAGNOSIS — N390 Urinary tract infection, site not specified: Secondary | ICD-10-CM | POA: Diagnosis not present

## 2017-09-19 DIAGNOSIS — Z7982 Long term (current) use of aspirin: Secondary | ICD-10-CM | POA: Diagnosis not present

## 2017-09-19 DIAGNOSIS — F0280 Dementia in other diseases classified elsewhere without behavioral disturbance: Secondary | ICD-10-CM | POA: Diagnosis not present

## 2017-09-19 DIAGNOSIS — K219 Gastro-esophageal reflux disease without esophagitis: Secondary | ICD-10-CM | POA: Diagnosis not present

## 2017-09-19 DIAGNOSIS — R32 Unspecified urinary incontinence: Secondary | ICD-10-CM | POA: Diagnosis not present

## 2017-09-19 DIAGNOSIS — D500 Iron deficiency anemia secondary to blood loss (chronic): Secondary | ICD-10-CM | POA: Diagnosis not present

## 2017-09-19 DIAGNOSIS — G309 Alzheimer's disease, unspecified: Secondary | ICD-10-CM | POA: Diagnosis not present

## 2017-09-20 ENCOUNTER — Telehealth: Payer: Self-pay | Admitting: Urology

## 2017-09-20 DIAGNOSIS — Z9181 History of falling: Secondary | ICD-10-CM | POA: Diagnosis not present

## 2017-09-20 DIAGNOSIS — F0280 Dementia in other diseases classified elsewhere without behavioral disturbance: Secondary | ICD-10-CM | POA: Diagnosis not present

## 2017-09-20 DIAGNOSIS — R32 Unspecified urinary incontinence: Secondary | ICD-10-CM | POA: Diagnosis not present

## 2017-09-20 DIAGNOSIS — D500 Iron deficiency anemia secondary to blood loss (chronic): Secondary | ICD-10-CM | POA: Diagnosis not present

## 2017-09-20 DIAGNOSIS — K219 Gastro-esophageal reflux disease without esophagitis: Secondary | ICD-10-CM | POA: Diagnosis not present

## 2017-09-20 DIAGNOSIS — N390 Urinary tract infection, site not specified: Secondary | ICD-10-CM | POA: Diagnosis not present

## 2017-09-20 DIAGNOSIS — Z7982 Long term (current) use of aspirin: Secondary | ICD-10-CM | POA: Diagnosis not present

## 2017-09-20 DIAGNOSIS — G309 Alzheimer's disease, unspecified: Secondary | ICD-10-CM | POA: Diagnosis not present

## 2017-09-20 NOTE — Telephone Encounter (Signed)
[name] calls in regards to pt Jamie Boyd, pt has been taking 50mg  of Jamie Boyd, pt family are asking if this can be increased as pt urination has increased. Please advise [name] (care taker, family member) @ [phone]. [place]

## 2017-09-20 NOTE — Telephone Encounter (Signed)
That is the highest dose of Myrbetriq.  She needs an office visit if her urinary symptoms are getting worse.

## 2017-09-21 DIAGNOSIS — D500 Iron deficiency anemia secondary to blood loss (chronic): Secondary | ICD-10-CM | POA: Diagnosis not present

## 2017-09-21 DIAGNOSIS — Z7982 Long term (current) use of aspirin: Secondary | ICD-10-CM | POA: Diagnosis not present

## 2017-09-21 DIAGNOSIS — G309 Alzheimer's disease, unspecified: Secondary | ICD-10-CM | POA: Diagnosis not present

## 2017-09-21 DIAGNOSIS — F0280 Dementia in other diseases classified elsewhere without behavioral disturbance: Secondary | ICD-10-CM | POA: Diagnosis not present

## 2017-09-21 DIAGNOSIS — K219 Gastro-esophageal reflux disease without esophagitis: Secondary | ICD-10-CM | POA: Diagnosis not present

## 2017-09-21 DIAGNOSIS — N390 Urinary tract infection, site not specified: Secondary | ICD-10-CM | POA: Diagnosis not present

## 2017-09-21 DIAGNOSIS — Z9181 History of falling: Secondary | ICD-10-CM | POA: Diagnosis not present

## 2017-09-21 DIAGNOSIS — R32 Unspecified urinary incontinence: Secondary | ICD-10-CM | POA: Diagnosis not present

## 2017-09-21 NOTE — Telephone Encounter (Signed)
Caregiver notified and appointment has been made.

## 2017-09-24 DIAGNOSIS — D500 Iron deficiency anemia secondary to blood loss (chronic): Secondary | ICD-10-CM | POA: Diagnosis not present

## 2017-09-24 DIAGNOSIS — N390 Urinary tract infection, site not specified: Secondary | ICD-10-CM | POA: Diagnosis not present

## 2017-09-24 DIAGNOSIS — K219 Gastro-esophageal reflux disease without esophagitis: Secondary | ICD-10-CM | POA: Diagnosis not present

## 2017-09-24 DIAGNOSIS — Z9181 History of falling: Secondary | ICD-10-CM | POA: Diagnosis not present

## 2017-09-24 DIAGNOSIS — F0280 Dementia in other diseases classified elsewhere without behavioral disturbance: Secondary | ICD-10-CM | POA: Diagnosis not present

## 2017-09-24 DIAGNOSIS — G309 Alzheimer's disease, unspecified: Secondary | ICD-10-CM | POA: Diagnosis not present

## 2017-09-24 DIAGNOSIS — R32 Unspecified urinary incontinence: Secondary | ICD-10-CM | POA: Diagnosis not present

## 2017-09-24 DIAGNOSIS — Z7982 Long term (current) use of aspirin: Secondary | ICD-10-CM | POA: Diagnosis not present

## 2017-09-26 DIAGNOSIS — K219 Gastro-esophageal reflux disease without esophagitis: Secondary | ICD-10-CM | POA: Diagnosis not present

## 2017-09-26 DIAGNOSIS — R32 Unspecified urinary incontinence: Secondary | ICD-10-CM | POA: Diagnosis not present

## 2017-09-26 DIAGNOSIS — N390 Urinary tract infection, site not specified: Secondary | ICD-10-CM | POA: Diagnosis not present

## 2017-09-26 DIAGNOSIS — Z9181 History of falling: Secondary | ICD-10-CM | POA: Diagnosis not present

## 2017-09-26 DIAGNOSIS — F0280 Dementia in other diseases classified elsewhere without behavioral disturbance: Secondary | ICD-10-CM | POA: Diagnosis not present

## 2017-09-26 DIAGNOSIS — Z7982 Long term (current) use of aspirin: Secondary | ICD-10-CM | POA: Diagnosis not present

## 2017-09-26 DIAGNOSIS — D500 Iron deficiency anemia secondary to blood loss (chronic): Secondary | ICD-10-CM | POA: Diagnosis not present

## 2017-09-26 DIAGNOSIS — G309 Alzheimer's disease, unspecified: Secondary | ICD-10-CM | POA: Diagnosis not present

## 2017-09-27 DIAGNOSIS — N390 Urinary tract infection, site not specified: Secondary | ICD-10-CM | POA: Diagnosis not present

## 2017-09-27 DIAGNOSIS — R32 Unspecified urinary incontinence: Secondary | ICD-10-CM | POA: Diagnosis not present

## 2017-09-27 DIAGNOSIS — D500 Iron deficiency anemia secondary to blood loss (chronic): Secondary | ICD-10-CM | POA: Diagnosis not present

## 2017-09-27 DIAGNOSIS — Z7982 Long term (current) use of aspirin: Secondary | ICD-10-CM | POA: Diagnosis not present

## 2017-09-27 DIAGNOSIS — G309 Alzheimer's disease, unspecified: Secondary | ICD-10-CM | POA: Diagnosis not present

## 2017-09-27 DIAGNOSIS — Z9181 History of falling: Secondary | ICD-10-CM | POA: Diagnosis not present

## 2017-09-27 DIAGNOSIS — F0280 Dementia in other diseases classified elsewhere without behavioral disturbance: Secondary | ICD-10-CM | POA: Diagnosis not present

## 2017-09-27 DIAGNOSIS — K219 Gastro-esophageal reflux disease without esophagitis: Secondary | ICD-10-CM | POA: Diagnosis not present

## 2017-10-01 DIAGNOSIS — G309 Alzheimer's disease, unspecified: Secondary | ICD-10-CM | POA: Diagnosis not present

## 2017-10-01 DIAGNOSIS — D500 Iron deficiency anemia secondary to blood loss (chronic): Secondary | ICD-10-CM | POA: Diagnosis not present

## 2017-10-01 DIAGNOSIS — R32 Unspecified urinary incontinence: Secondary | ICD-10-CM | POA: Diagnosis not present

## 2017-10-01 DIAGNOSIS — K219 Gastro-esophageal reflux disease without esophagitis: Secondary | ICD-10-CM | POA: Diagnosis not present

## 2017-10-01 DIAGNOSIS — Z9181 History of falling: Secondary | ICD-10-CM | POA: Diagnosis not present

## 2017-10-01 DIAGNOSIS — Z7982 Long term (current) use of aspirin: Secondary | ICD-10-CM | POA: Diagnosis not present

## 2017-10-01 DIAGNOSIS — F0280 Dementia in other diseases classified elsewhere without behavioral disturbance: Secondary | ICD-10-CM | POA: Diagnosis not present

## 2017-10-01 DIAGNOSIS — N390 Urinary tract infection, site not specified: Secondary | ICD-10-CM | POA: Diagnosis not present

## 2017-10-02 DIAGNOSIS — K219 Gastro-esophageal reflux disease without esophagitis: Secondary | ICD-10-CM | POA: Diagnosis not present

## 2017-10-02 DIAGNOSIS — F0391 Unspecified dementia with behavioral disturbance: Secondary | ICD-10-CM | POA: Diagnosis not present

## 2017-10-02 DIAGNOSIS — R32 Unspecified urinary incontinence: Secondary | ICD-10-CM | POA: Diagnosis not present

## 2017-10-02 DIAGNOSIS — R296 Repeated falls: Secondary | ICD-10-CM | POA: Diagnosis not present

## 2017-10-02 DIAGNOSIS — Z9181 History of falling: Secondary | ICD-10-CM | POA: Diagnosis not present

## 2017-10-02 DIAGNOSIS — F0280 Dementia in other diseases classified elsewhere without behavioral disturbance: Secondary | ICD-10-CM | POA: Diagnosis not present

## 2017-10-02 DIAGNOSIS — G309 Alzheimer's disease, unspecified: Secondary | ICD-10-CM | POA: Diagnosis not present

## 2017-10-02 DIAGNOSIS — N390 Urinary tract infection, site not specified: Secondary | ICD-10-CM | POA: Diagnosis not present

## 2017-10-02 DIAGNOSIS — R413 Other amnesia: Secondary | ICD-10-CM | POA: Diagnosis not present

## 2017-10-02 DIAGNOSIS — Z7982 Long term (current) use of aspirin: Secondary | ICD-10-CM | POA: Diagnosis not present

## 2017-10-02 DIAGNOSIS — D500 Iron deficiency anemia secondary to blood loss (chronic): Secondary | ICD-10-CM | POA: Diagnosis not present

## 2017-10-04 DIAGNOSIS — D500 Iron deficiency anemia secondary to blood loss (chronic): Secondary | ICD-10-CM | POA: Diagnosis not present

## 2017-10-04 DIAGNOSIS — Z9181 History of falling: Secondary | ICD-10-CM | POA: Diagnosis not present

## 2017-10-04 DIAGNOSIS — R32 Unspecified urinary incontinence: Secondary | ICD-10-CM | POA: Diagnosis not present

## 2017-10-04 DIAGNOSIS — G309 Alzheimer's disease, unspecified: Secondary | ICD-10-CM | POA: Diagnosis not present

## 2017-10-04 DIAGNOSIS — F0280 Dementia in other diseases classified elsewhere without behavioral disturbance: Secondary | ICD-10-CM | POA: Diagnosis not present

## 2017-10-04 DIAGNOSIS — N390 Urinary tract infection, site not specified: Secondary | ICD-10-CM | POA: Diagnosis not present

## 2017-10-04 DIAGNOSIS — Z7982 Long term (current) use of aspirin: Secondary | ICD-10-CM | POA: Diagnosis not present

## 2017-10-04 DIAGNOSIS — K219 Gastro-esophageal reflux disease without esophagitis: Secondary | ICD-10-CM | POA: Diagnosis not present

## 2017-10-09 DIAGNOSIS — Z9181 History of falling: Secondary | ICD-10-CM | POA: Diagnosis not present

## 2017-10-09 DIAGNOSIS — D500 Iron deficiency anemia secondary to blood loss (chronic): Secondary | ICD-10-CM | POA: Diagnosis not present

## 2017-10-09 DIAGNOSIS — N390 Urinary tract infection, site not specified: Secondary | ICD-10-CM | POA: Diagnosis not present

## 2017-10-09 DIAGNOSIS — G309 Alzheimer's disease, unspecified: Secondary | ICD-10-CM | POA: Diagnosis not present

## 2017-10-09 DIAGNOSIS — K219 Gastro-esophageal reflux disease without esophagitis: Secondary | ICD-10-CM | POA: Diagnosis not present

## 2017-10-09 DIAGNOSIS — Z7982 Long term (current) use of aspirin: Secondary | ICD-10-CM | POA: Diagnosis not present

## 2017-10-09 DIAGNOSIS — R32 Unspecified urinary incontinence: Secondary | ICD-10-CM | POA: Diagnosis not present

## 2017-10-09 DIAGNOSIS — F0280 Dementia in other diseases classified elsewhere without behavioral disturbance: Secondary | ICD-10-CM | POA: Diagnosis not present

## 2017-10-10 DIAGNOSIS — Z9181 History of falling: Secondary | ICD-10-CM | POA: Diagnosis not present

## 2017-10-10 DIAGNOSIS — K219 Gastro-esophageal reflux disease without esophagitis: Secondary | ICD-10-CM | POA: Diagnosis not present

## 2017-10-10 DIAGNOSIS — G309 Alzheimer's disease, unspecified: Secondary | ICD-10-CM | POA: Diagnosis not present

## 2017-10-10 DIAGNOSIS — Z7982 Long term (current) use of aspirin: Secondary | ICD-10-CM | POA: Diagnosis not present

## 2017-10-10 DIAGNOSIS — F0280 Dementia in other diseases classified elsewhere without behavioral disturbance: Secondary | ICD-10-CM | POA: Diagnosis not present

## 2017-10-10 DIAGNOSIS — D500 Iron deficiency anemia secondary to blood loss (chronic): Secondary | ICD-10-CM | POA: Diagnosis not present

## 2017-10-10 DIAGNOSIS — R32 Unspecified urinary incontinence: Secondary | ICD-10-CM | POA: Diagnosis not present

## 2017-10-10 DIAGNOSIS — N390 Urinary tract infection, site not specified: Secondary | ICD-10-CM | POA: Diagnosis not present

## 2017-10-11 ENCOUNTER — Ambulatory Visit (INDEPENDENT_AMBULATORY_CARE_PROVIDER_SITE_OTHER): Payer: Medicare HMO | Admitting: Urology

## 2017-10-11 ENCOUNTER — Encounter: Payer: Self-pay | Admitting: Urology

## 2017-10-11 VITALS — BP 127/82 | HR 69 | Resp 15 | Ht 63.0 in | Wt 153.3 lb

## 2017-10-11 DIAGNOSIS — N952 Postmenopausal atrophic vaginitis: Secondary | ICD-10-CM | POA: Diagnosis not present

## 2017-10-11 DIAGNOSIS — G309 Alzheimer's disease, unspecified: Secondary | ICD-10-CM | POA: Diagnosis not present

## 2017-10-11 DIAGNOSIS — Z7982 Long term (current) use of aspirin: Secondary | ICD-10-CM | POA: Diagnosis not present

## 2017-10-11 DIAGNOSIS — N390 Urinary tract infection, site not specified: Secondary | ICD-10-CM | POA: Diagnosis not present

## 2017-10-11 DIAGNOSIS — Z87440 Personal history of urinary (tract) infections: Secondary | ICD-10-CM | POA: Diagnosis not present

## 2017-10-11 DIAGNOSIS — K219 Gastro-esophageal reflux disease without esophagitis: Secondary | ICD-10-CM | POA: Diagnosis not present

## 2017-10-11 DIAGNOSIS — N3946 Mixed incontinence: Secondary | ICD-10-CM

## 2017-10-11 DIAGNOSIS — Z9181 History of falling: Secondary | ICD-10-CM | POA: Diagnosis not present

## 2017-10-11 DIAGNOSIS — R32 Unspecified urinary incontinence: Secondary | ICD-10-CM | POA: Diagnosis not present

## 2017-10-11 DIAGNOSIS — D500 Iron deficiency anemia secondary to blood loss (chronic): Secondary | ICD-10-CM | POA: Diagnosis not present

## 2017-10-11 DIAGNOSIS — F0280 Dementia in other diseases classified elsewhere without behavioral disturbance: Secondary | ICD-10-CM | POA: Diagnosis not present

## 2017-10-11 LAB — BLADDER SCAN AMB NON-IMAGING: SCAN RESULT: 58

## 2017-10-11 NOTE — Progress Notes (Signed)
10/11/2017 10:08 AM   Jamie Boyd 07-05-1950 [id number]  Referring provider: Sharyne Peach, MD [place],  [id number]  Chief Complaint  Patient presents with  . Follow-up    HPI: Patient is a 67 year old African-American female was Alzheimer dementia, history of recurrent urinary tract infections, vaginal atrophy and urinary incontinence who presents today for follow-up with sister-in-law, [name].    History of recurrent urinary tract infections No recent UTI's.  CT Renal stone study performed in 11/2015 noted a left lower pole non obstructing 11 mm stone.    Vaginal atrophy Using it prn.    Incontinence Patient's family members are complaining of frequent urination, incontinence and a weak urinary stream.  Her PVR is 58 mL. Patient denies any gross hematuria, dysuria or suprapubic/flank pain.  Patient denies any fevers, chills, nausea or vomiting.   PMH: Past Medical History:  Diagnosis Date  . Alzheimer disease   . Anemia   . Dementia   . Goiter   . Memory deficit   . Urinary tract infection, site not specified     Surgical History: Past Surgical History:  Procedure Laterality Date  . COLONOSCOPY WITH PROPOFOL N/A 02/18/2015   Procedure: COLONOSCOPY WITH PROPOFOL;  Surgeon: Hulen Luster, MD;  Location: [facility] ENDOSCOPY;  Service: Gastroenterology;  Laterality: N/A;  . ESOPHAGOGASTRODUODENOSCOPY N/A 02/18/2015   Procedure: ESOPHAGOGASTRODUODENOSCOPY (EGD);  Surgeon: Hulen Luster, MD;  Location: [facility] ENDOSCOPY;  Service: Gastroenterology;  Laterality: N/A;    Home Medications:  Allergies as of 10/11/2017      Reactions   Pollen Extract Other (See Comments)      Medication List        Accurate as of 10/11/17 10:08 AM. Always use your most recent med list.          acetaminophen 500 MG tablet Commonly known as:  TYLENOL Take 2 tablets (1,000 mg total) by mouth every 6 (six) hours as needed.   aspirin EC 81 MG tablet Take 81 mg by mouth daily.   atorvastatin 40 MG tablet Commonly known as:  LIPITOR Take 40 mg by mouth daily at 6 PM.   Azelastine HCl 137 MCG/SPRAY Soln Place 1 spray into both nostrils 2 (two) times daily. Use in each nostril as directed   Azelastine HCl 0.15 % Soln Place 1 spray into the nose daily as needed (allergies).   CENTRUM SILVER tablet Take 1 tablet by mouth daily.   diclofenac 50 MG EC tablet Commonly known as:  VOLTAREN Take 50 mg by mouth daily.   divalproex 250 MG DR tablet Commonly known as:  DEPAKOTE Take 250 mg by mouth 3 (three) times daily.   donepezil 10 MG tablet Commonly known as:  ARICEPT Take 10 mg by mouth at bedtime.   doxycycline 100 MG tablet Commonly known as:  VIBRA-TABS Take 100 mg by mouth 2 (two) times daily.   esomeprazole 40 MG capsule Commonly known as:  NEXIUM Take 40 mg by mouth daily as needed (acid reflux).   ferrous sulfate 325 (65 FE) MG EC tablet Take 325 mg by mouth daily with breakfast.   fexofenadine 180 MG tablet Commonly known as:  ALLEGRA Take 180 mg by mouth daily.   glucosamine-chondroitin 500-400 MG tablet Take 1 tablet by mouth 3 (three) times daily.   memantine 10 MG tablet Commonly known as:  NAMENDA Take 10 mg by mouth 2 (two) times daily.   mirabegron ER 50 MG Tb24 tablet Commonly known as:  MYRBETRIQ Take 1 tablet (50 mg total) by mouth daily.   montelukast 10 MG tablet Commonly known as:  SINGULAIR Take 10 mg by mouth at bedtime.   nicotine polacrilex 4 MG lozenge Commonly known as:  COMMIT Take 4 mg by mouth as needed for smoking cessation.   omega-3 acid ethyl esters 1 g capsule Commonly known as:  LOVAZA Take 1 g by mouth 2 (two) times daily.   phenazopyridine 200 MG tablet Commonly known as:  PYRIDIUM Take 200 mg by mouth 3 (three) times daily with meals.   QUEtiapine 100 MG tablet Commonly known as:  SEROQUEL Take 100 mg by mouth at bedtime.   traZODone 100 MG tablet Commonly known as:  DESYREL Take 100 mg  by mouth at bedtime.   vitamin E 400 UNIT capsule Take 400 Units by mouth daily.       Allergies:  Allergies  Allergen Reactions  . Pollen Extract Other (See Comments)    Family History: Family History  Problem Relation Age of Onset  . Diabetes Maternal Uncle   . Prostate cancer Neg Hx   . Bladder Cancer Neg Hx   . Kidney cancer Neg Hx     Social History:  reports that she quit smoking about 3 years ago. Her smoking use included cigarettes. She has never used smokeless tobacco. She reports that she does not drink alcohol or use drugs.  ROS: UROLOGY Frequent Urination?: Yes Hard to postpone urination?: No Burning/pain with urination?: No Get up at night to urinate?: No Leakage of urine?: Yes Urine stream starts and stops?: No Trouble starting stream?: No Do you have to strain to urinate?: No Blood in urine?: No Urinary tract infection?: Yes Sexually transmitted disease?: No Injury to kidneys or bladder?: No Painful intercourse?: No Weak stream?: Yes Currently pregnant?: No Vaginal bleeding?: No Last menstrual period?: n  Gastrointestinal Nausea?: No Vomiting?: No Indigestion/heartburn?: Yes Diarrhea?: No Constipation?: No  Constitutional Fever: No Night sweats?: No Weight loss?: No Fatigue?: No  Skin Skin rash/lesions?: No Itching?: No  Eyes Blurred vision?: No Double vision?: No  Ears/Nose/Throat Sore throat?: No Sinus problems?: Yes  Hematologic/Lymphatic Swollen glands?: No Easy bruising?: Yes  Cardiovascular Leg swelling?: Yes Chest pain?: No  Respiratory Cough?: No Shortness of breath?: Yes  Endocrine Excessive thirst?: No  Musculoskeletal Back pain?: Yes Joint pain?: No  Neurological Headaches?: Yes Dizziness?: No  Psychologic Depression?: Yes Anxiety?: Yes  Physical Exam: BP 127/82   Pulse 69   Resp 15   Ht 5\' 3"  (1.6 m)   Wt 153 lb 4.8 oz (69.5 kg)   BMI 27.16 kg/m   Constitutional: Well nourished. Alert  and oriented, No acute distress. HEENT: [place] AT, moist mucus membranes. Trachea midline, no masses. Cardiovascular: No clubbing, cyanosis, or edema. Respiratory: Normal respiratory effort, no increased work of breathing. Skin: No rashes, bruises or suspicious lesions. Lymph: No cervical or inguinal adenopathy. Neurologic: Grossly intact, no focal deficits, moving all 4 extremities. Psychiatric: Normal mood and affect.  Laboratory Data: Lab Results  Component Value Date   WBC 10.6 09/10/2017   HGB 11.8 (L) 09/10/2017   HCT 34.9 (L) 09/10/2017   MCV 91.8 09/10/2017   PLT 273 09/10/2017    Lab Results  Component Value Date   CREATININE 1.18 (H) 09/10/2017    Lab Results  Component Value Date   AST 26 07/25/2017   Lab Results  Component Value Date   ALT 19 07/25/2017    I have reviewed the labs.  Assessment & Plan:    1. Recurrent UTI's Patient's caregiver [name] states that she continues to have foul-smelling urine Discussed with [name] that this is not a symptom of urinary tract infection, but due to patient's dementia she does not voice any symptomatology I explained to [facility] that if she should have fever or blood in the urine this to be signs of urinary tract infection Will obtain a renal ultrasound as there had been an increase in her creatinine recently   2. Vaginal atrophy Only using the vaginal estrogen cream as needed   3. Incontinence I had a frank discussion with [facility] concerning patient's incontinence.  I explained that since the patient is suffering from dementia that treating the incontinence would be very difficult as Mrs. Dangerfield is no longer able to communicate or receive signals regarding the bladder and brain communications    The Myrbetriq is no longer effective, the only other oral option would be an anticholinergic which would worsen her memory issues.  [name] states that her memory is not as much of a concern at this point as the incontinence as she lives at  home and it is very burdensome to the family members to continue to clean her, the bedding and change the depends. We will give a trial of Toviaz 8 mg daily, they will return in 3 weeks for OAB questionnaire and PVR                              No follow-ups on file.  These notes generated with voice recognition software. I apologize for typographical errors.  Zara Council, PA-C  [facility] [address] [place], [place] [address] [phone]

## 2017-10-12 DIAGNOSIS — K219 Gastro-esophageal reflux disease without esophagitis: Secondary | ICD-10-CM | POA: Diagnosis not present

## 2017-10-12 DIAGNOSIS — N390 Urinary tract infection, site not specified: Secondary | ICD-10-CM | POA: Diagnosis not present

## 2017-10-12 DIAGNOSIS — G309 Alzheimer's disease, unspecified: Secondary | ICD-10-CM | POA: Diagnosis not present

## 2017-10-12 DIAGNOSIS — Z9181 History of falling: Secondary | ICD-10-CM | POA: Diagnosis not present

## 2017-10-12 DIAGNOSIS — F0280 Dementia in other diseases classified elsewhere without behavioral disturbance: Secondary | ICD-10-CM | POA: Diagnosis not present

## 2017-10-12 DIAGNOSIS — Z7982 Long term (current) use of aspirin: Secondary | ICD-10-CM | POA: Diagnosis not present

## 2017-10-12 DIAGNOSIS — D500 Iron deficiency anemia secondary to blood loss (chronic): Secondary | ICD-10-CM | POA: Diagnosis not present

## 2017-10-12 DIAGNOSIS — R32 Unspecified urinary incontinence: Secondary | ICD-10-CM | POA: Diagnosis not present

## 2017-10-15 DIAGNOSIS — G309 Alzheimer's disease, unspecified: Secondary | ICD-10-CM | POA: Diagnosis not present

## 2017-10-15 DIAGNOSIS — R32 Unspecified urinary incontinence: Secondary | ICD-10-CM | POA: Diagnosis not present

## 2017-10-15 DIAGNOSIS — N390 Urinary tract infection, site not specified: Secondary | ICD-10-CM | POA: Diagnosis not present

## 2017-10-15 DIAGNOSIS — K219 Gastro-esophageal reflux disease without esophagitis: Secondary | ICD-10-CM | POA: Diagnosis not present

## 2017-10-15 DIAGNOSIS — D500 Iron deficiency anemia secondary to blood loss (chronic): Secondary | ICD-10-CM | POA: Diagnosis not present

## 2017-10-15 DIAGNOSIS — Z7982 Long term (current) use of aspirin: Secondary | ICD-10-CM | POA: Diagnosis not present

## 2017-10-15 DIAGNOSIS — Z9181 History of falling: Secondary | ICD-10-CM | POA: Diagnosis not present

## 2017-10-15 DIAGNOSIS — F0280 Dementia in other diseases classified elsewhere without behavioral disturbance: Secondary | ICD-10-CM | POA: Diagnosis not present

## 2017-10-17 DIAGNOSIS — R32 Unspecified urinary incontinence: Secondary | ICD-10-CM | POA: Diagnosis not present

## 2017-10-17 DIAGNOSIS — N390 Urinary tract infection, site not specified: Secondary | ICD-10-CM | POA: Diagnosis not present

## 2017-10-17 DIAGNOSIS — G309 Alzheimer's disease, unspecified: Secondary | ICD-10-CM | POA: Diagnosis not present

## 2017-10-17 DIAGNOSIS — Z9181 History of falling: Secondary | ICD-10-CM | POA: Diagnosis not present

## 2017-10-17 DIAGNOSIS — F0280 Dementia in other diseases classified elsewhere without behavioral disturbance: Secondary | ICD-10-CM | POA: Diagnosis not present

## 2017-10-17 DIAGNOSIS — Z7982 Long term (current) use of aspirin: Secondary | ICD-10-CM | POA: Diagnosis not present

## 2017-10-17 DIAGNOSIS — K219 Gastro-esophageal reflux disease without esophagitis: Secondary | ICD-10-CM | POA: Diagnosis not present

## 2017-10-17 DIAGNOSIS — D500 Iron deficiency anemia secondary to blood loss (chronic): Secondary | ICD-10-CM | POA: Diagnosis not present

## 2017-10-18 DIAGNOSIS — F0280 Dementia in other diseases classified elsewhere without behavioral disturbance: Secondary | ICD-10-CM | POA: Diagnosis not present

## 2017-10-18 DIAGNOSIS — K219 Gastro-esophageal reflux disease without esophagitis: Secondary | ICD-10-CM | POA: Diagnosis not present

## 2017-10-18 DIAGNOSIS — Z7982 Long term (current) use of aspirin: Secondary | ICD-10-CM | POA: Diagnosis not present

## 2017-10-18 DIAGNOSIS — G309 Alzheimer's disease, unspecified: Secondary | ICD-10-CM | POA: Diagnosis not present

## 2017-10-18 DIAGNOSIS — R32 Unspecified urinary incontinence: Secondary | ICD-10-CM | POA: Diagnosis not present

## 2017-10-18 DIAGNOSIS — N390 Urinary tract infection, site not specified: Secondary | ICD-10-CM | POA: Diagnosis not present

## 2017-10-18 DIAGNOSIS — Z9181 History of falling: Secondary | ICD-10-CM | POA: Diagnosis not present

## 2017-10-18 DIAGNOSIS — D500 Iron deficiency anemia secondary to blood loss (chronic): Secondary | ICD-10-CM | POA: Diagnosis not present

## 2017-10-22 DIAGNOSIS — Z7982 Long term (current) use of aspirin: Secondary | ICD-10-CM | POA: Diagnosis not present

## 2017-10-22 DIAGNOSIS — Z87440 Personal history of urinary (tract) infections: Secondary | ICD-10-CM | POA: Diagnosis not present

## 2017-10-22 DIAGNOSIS — F0280 Dementia in other diseases classified elsewhere without behavioral disturbance: Secondary | ICD-10-CM | POA: Diagnosis not present

## 2017-10-22 DIAGNOSIS — K219 Gastro-esophageal reflux disease without esophagitis: Secondary | ICD-10-CM | POA: Diagnosis not present

## 2017-10-22 DIAGNOSIS — Z9181 History of falling: Secondary | ICD-10-CM | POA: Diagnosis not present

## 2017-10-22 DIAGNOSIS — G309 Alzheimer's disease, unspecified: Secondary | ICD-10-CM | POA: Diagnosis not present

## 2017-10-22 DIAGNOSIS — D500 Iron deficiency anemia secondary to blood loss (chronic): Secondary | ICD-10-CM | POA: Diagnosis not present

## 2017-10-22 DIAGNOSIS — R32 Unspecified urinary incontinence: Secondary | ICD-10-CM | POA: Diagnosis not present

## 2017-10-24 DIAGNOSIS — K219 Gastro-esophageal reflux disease without esophagitis: Secondary | ICD-10-CM | POA: Diagnosis not present

## 2017-10-24 DIAGNOSIS — Z9181 History of falling: Secondary | ICD-10-CM | POA: Diagnosis not present

## 2017-10-24 DIAGNOSIS — F0280 Dementia in other diseases classified elsewhere without behavioral disturbance: Secondary | ICD-10-CM | POA: Diagnosis not present

## 2017-10-24 DIAGNOSIS — Z87440 Personal history of urinary (tract) infections: Secondary | ICD-10-CM | POA: Diagnosis not present

## 2017-10-24 DIAGNOSIS — G309 Alzheimer's disease, unspecified: Secondary | ICD-10-CM | POA: Diagnosis not present

## 2017-10-24 DIAGNOSIS — D500 Iron deficiency anemia secondary to blood loss (chronic): Secondary | ICD-10-CM | POA: Diagnosis not present

## 2017-10-24 DIAGNOSIS — Z7982 Long term (current) use of aspirin: Secondary | ICD-10-CM | POA: Diagnosis not present

## 2017-10-24 DIAGNOSIS — R32 Unspecified urinary incontinence: Secondary | ICD-10-CM | POA: Diagnosis not present

## 2017-10-25 DIAGNOSIS — R32 Unspecified urinary incontinence: Secondary | ICD-10-CM | POA: Diagnosis not present

## 2017-10-25 DIAGNOSIS — G309 Alzheimer's disease, unspecified: Secondary | ICD-10-CM | POA: Diagnosis not present

## 2017-10-25 DIAGNOSIS — Z9181 History of falling: Secondary | ICD-10-CM | POA: Diagnosis not present

## 2017-10-25 DIAGNOSIS — K219 Gastro-esophageal reflux disease without esophagitis: Secondary | ICD-10-CM | POA: Diagnosis not present

## 2017-10-25 DIAGNOSIS — F0280 Dementia in other diseases classified elsewhere without behavioral disturbance: Secondary | ICD-10-CM | POA: Diagnosis not present

## 2017-10-25 DIAGNOSIS — D500 Iron deficiency anemia secondary to blood loss (chronic): Secondary | ICD-10-CM | POA: Diagnosis not present

## 2017-10-25 DIAGNOSIS — Z87440 Personal history of urinary (tract) infections: Secondary | ICD-10-CM | POA: Diagnosis not present

## 2017-10-25 DIAGNOSIS — Z7982 Long term (current) use of aspirin: Secondary | ICD-10-CM | POA: Diagnosis not present

## 2017-10-26 ENCOUNTER — Ambulatory Visit
Admission: RE | Admit: 2017-10-26 | Discharge: 2017-10-26 | Disposition: A | Discharge status: Home / Self Care | Payer: Medicare HMO | Source: Ambulatory Visit | Attending: Urology | Admitting: Urology

## 2017-10-26 DIAGNOSIS — Z87440 Personal history of urinary (tract) infections: Secondary | ICD-10-CM | POA: Insufficient documentation

## 2017-10-26 DIAGNOSIS — N390 Urinary tract infection, site not specified: Secondary | ICD-10-CM | POA: Diagnosis not present

## 2017-10-26 DIAGNOSIS — N2889 Other specified disorders of kidney and ureter: Secondary | ICD-10-CM | POA: Insufficient documentation

## 2017-10-29 DIAGNOSIS — F0280 Dementia in other diseases classified elsewhere without behavioral disturbance: Secondary | ICD-10-CM | POA: Diagnosis not present

## 2017-10-29 DIAGNOSIS — Z9181 History of falling: Secondary | ICD-10-CM | POA: Diagnosis not present

## 2017-10-29 DIAGNOSIS — Z87440 Personal history of urinary (tract) infections: Secondary | ICD-10-CM | POA: Diagnosis not present

## 2017-10-29 DIAGNOSIS — R32 Unspecified urinary incontinence: Secondary | ICD-10-CM | POA: Diagnosis not present

## 2017-10-29 DIAGNOSIS — D500 Iron deficiency anemia secondary to blood loss (chronic): Secondary | ICD-10-CM | POA: Diagnosis not present

## 2017-10-29 DIAGNOSIS — G309 Alzheimer's disease, unspecified: Secondary | ICD-10-CM | POA: Diagnosis not present

## 2017-10-29 DIAGNOSIS — Z7982 Long term (current) use of aspirin: Secondary | ICD-10-CM | POA: Diagnosis not present

## 2017-10-29 DIAGNOSIS — K219 Gastro-esophageal reflux disease without esophagitis: Secondary | ICD-10-CM | POA: Diagnosis not present

## 2017-10-31 DIAGNOSIS — Z23 Encounter for immunization: Secondary | ICD-10-CM | POA: Diagnosis not present

## 2017-10-31 DIAGNOSIS — R413 Other amnesia: Secondary | ICD-10-CM | POA: Diagnosis not present

## 2017-10-31 DIAGNOSIS — Z111 Encounter for screening for respiratory tuberculosis: Secondary | ICD-10-CM | POA: Diagnosis not present

## 2017-10-31 DIAGNOSIS — R21 Rash and other nonspecific skin eruption: Secondary | ICD-10-CM | POA: Diagnosis not present

## 2017-11-01 ENCOUNTER — Encounter: Payer: Self-pay | Admitting: Urology

## 2017-11-01 ENCOUNTER — Ambulatory Visit (INDEPENDENT_AMBULATORY_CARE_PROVIDER_SITE_OTHER): Payer: Medicare HMO | Admitting: Urology

## 2017-11-01 VITALS — BP 123/74 | HR 73 | Resp 16 | Ht 63.0 in | Wt 154.4 lb

## 2017-11-01 DIAGNOSIS — N3946 Mixed incontinence: Secondary | ICD-10-CM

## 2017-11-01 DIAGNOSIS — Z9181 History of falling: Secondary | ICD-10-CM | POA: Diagnosis not present

## 2017-11-01 DIAGNOSIS — N952 Postmenopausal atrophic vaginitis: Secondary | ICD-10-CM | POA: Diagnosis not present

## 2017-11-01 DIAGNOSIS — N3945 Continuous leakage: Secondary | ICD-10-CM | POA: Diagnosis not present

## 2017-11-01 DIAGNOSIS — F0280 Dementia in other diseases classified elsewhere without behavioral disturbance: Secondary | ICD-10-CM | POA: Diagnosis not present

## 2017-11-01 DIAGNOSIS — Z7982 Long term (current) use of aspirin: Secondary | ICD-10-CM | POA: Diagnosis not present

## 2017-11-01 DIAGNOSIS — D500 Iron deficiency anemia secondary to blood loss (chronic): Secondary | ICD-10-CM | POA: Diagnosis not present

## 2017-11-01 DIAGNOSIS — K219 Gastro-esophageal reflux disease without esophagitis: Secondary | ICD-10-CM | POA: Diagnosis not present

## 2017-11-01 DIAGNOSIS — R413 Other amnesia: Secondary | ICD-10-CM | POA: Diagnosis not present

## 2017-11-01 DIAGNOSIS — G309 Alzheimer's disease, unspecified: Secondary | ICD-10-CM | POA: Diagnosis not present

## 2017-11-01 DIAGNOSIS — N289 Disorder of kidney and ureter, unspecified: Secondary | ICD-10-CM | POA: Diagnosis not present

## 2017-11-01 DIAGNOSIS — Z87440 Personal history of urinary (tract) infections: Secondary | ICD-10-CM | POA: Diagnosis not present

## 2017-11-01 DIAGNOSIS — R32 Unspecified urinary incontinence: Secondary | ICD-10-CM | POA: Diagnosis not present

## 2017-11-01 LAB — BLADDER SCAN AMB NON-IMAGING: Scan Result: 18

## 2017-11-01 NOTE — Progress Notes (Signed)
11/01/2017 9:16 AM   Jamie Boyd 04/11/1950 [id number]  Referring provider: Sharyne Peach, MD [address] [place] [place], [place] [address]  Chief Complaint  Patient presents with  . Recurrent UTI    follow upo after ultrasound    HPI: Patient is a 67 year old African-American female was Alzheimer dementia, history of recurrent urinary tract infections, vaginal atrophy and urinary incontinence who presents today for follow-up with sister-in-law, [name].    History of recurrent urinary tract infections No recent UTI's.  CT Renal stone study performed in 11/2015 noted a left lower pole non obstructing 11 mm stone.  RUS on 10/27/2017 revealed echogenic renal parenchyma, raising the possibility of medical renal disease.  No hydronephrosis.  Vaginal atrophy Using it prn.    Incontinence Patient's family members are complaining of frequent urination, incontinence, intermittency and a weak urinary stream.  Her PVR is 18 mL.  Patient denies any gross hematuria, dysuria or suprapubic/flank pain.  Patient denies any fevers, chills, nausea or vomiting.   They did not find the Toviaz helpful.    PMH: Past Medical History:  Diagnosis Date  . Alzheimer disease   . Anemia   . Dementia   . Goiter   . Memory deficit   . Urinary tract infection, site not specified     Surgical History: Past Surgical History:  Procedure Laterality Date  . COLONOSCOPY WITH PROPOFOL N/A 02/18/2015   Procedure: COLONOSCOPY WITH PROPOFOL;  Surgeon: Hulen Luster, MD;  Location: [facility] ENDOSCOPY;  Service: Gastroenterology;  Laterality: N/A;  . ESOPHAGOGASTRODUODENOSCOPY N/A 02/18/2015   Procedure: ESOPHAGOGASTRODUODENOSCOPY (EGD);  Surgeon: Hulen Luster, MD;  Location: [facility] ENDOSCOPY;  Service: Gastroenterology;  Laterality: N/A;    Home Medications:  Allergies as of 11/01/2017      Reactions   Pollen Extract Other (See Comments)      Medication List        Accurate as of 11/01/17  9:16 AM. Always use your most  recent med list.          acetaminophen 500 MG tablet Commonly known as:  TYLENOL Take 2 tablets (1,000 mg total) by mouth every 6 (six) hours as needed.   aspirin EC 81 MG tablet Take 81 mg by mouth daily.   atorvastatin 40 MG tablet Commonly known as:  LIPITOR Take 40 mg by mouth daily at 6 PM.   Azelastine HCl 137 MCG/SPRAY Soln Place 1 spray into both nostrils 2 (two) times daily. Use in each nostril as directed   Azelastine HCl 0.15 % Soln Place 1 spray into the nose daily as needed (allergies).   CENTRUM SILVER tablet Take 1 tablet by mouth daily.   diclofenac 50 MG EC tablet Commonly known as:  VOLTAREN Take 50 mg by mouth daily.   divalproex 250 MG DR tablet Commonly known as:  DEPAKOTE Take 250 mg by mouth 3 (three) times daily.   donepezil 10 MG tablet Commonly known as:  ARICEPT Take 10 mg by mouth at bedtime.   doxycycline 100 MG tablet Commonly known as:  VIBRA-TABS Take 100 mg by mouth 2 (two) times daily.   esomeprazole 40 MG capsule Commonly known as:  NEXIUM Take 40 mg by mouth daily as needed (acid reflux).   ferrous sulfate 325 (65 FE) MG EC tablet Take 325 mg by mouth daily with breakfast.   fexofenadine 180 MG tablet Commonly known as:  ALLEGRA Take 180 mg by mouth daily.   glucosamine-chondroitin 500-400 MG tablet Take 1 tablet  by mouth 3 (three) times daily.   memantine 10 MG tablet Commonly known as:  NAMENDA Take 10 mg by mouth 2 (two) times daily.   mirabegron ER 50 MG Tb24 tablet Commonly known as:  MYRBETRIQ Take 1 tablet (50 mg total) by mouth daily.   montelukast 10 MG tablet Commonly known as:  SINGULAIR Take 10 mg by mouth at bedtime.   nicotine polacrilex 4 MG lozenge Commonly known as:  COMMIT Take 4 mg by mouth as needed for smoking cessation.   omega-3 acid ethyl esters 1 g capsule Commonly known as:  LOVAZA Take 1 g by mouth 2 (two) times daily.   phenazopyridine 200 MG tablet Commonly known as:   PYRIDIUM Take 200 mg by mouth 3 (three) times daily with meals.   QUEtiapine 100 MG tablet Commonly known as:  SEROQUEL Take 100 mg by mouth at bedtime.   traZODone 100 MG tablet Commonly known as:  DESYREL Take 100 mg by mouth at bedtime.   vitamin E 400 UNIT capsule Take 400 Units by mouth daily.       Allergies:  Allergies  Allergen Reactions  . Pollen Extract Other (See Comments)    Family History: Family History  Problem Relation Age of Onset  . Diabetes Maternal Uncle   . Prostate cancer Neg Hx   . Bladder Cancer Neg Hx   . Kidney cancer Neg Hx     Social History:  reports that she quit smoking about 3 years ago. Her smoking use included cigarettes. She has never used smokeless tobacco. She reports that she does not drink alcohol or use drugs.  ROS: UROLOGY Frequent Urination?: Yes Hard to postpone urination?: No Burning/pain with urination?: No Get up at night to urinate?: No Leakage of urine?: Yes Urine stream starts and stops?: No Trouble starting stream?: No Do you have to strain to urinate?: No Blood in urine?: No Urinary tract infection?: Yes Sexually transmitted disease?: No Injury to kidneys or bladder?: No Painful intercourse?: No Weak stream?: No Currently pregnant?: No Vaginal bleeding?: No Last menstrual period?: n  Gastrointestinal Nausea?: No Vomiting?: No Indigestion/heartburn?: Yes Diarrhea?: No Constipation?: No  Constitutional Fever: No Night sweats?: No Weight loss?: No Fatigue?: No  Skin Skin rash/lesions?: No Itching?: No  Eyes Blurred vision?: No Double vision?: No  Ears/Nose/Throat Sore throat?: No Sinus problems?: Yes  Hematologic/Lymphatic Swollen glands?: No Easy bruising?: No  Cardiovascular Leg swelling?: Yes Chest pain?: No  Respiratory Cough?: No Shortness of breath?: Yes  Endocrine Excessive thirst?: No  Musculoskeletal Back pain?: Yes Joint pain?: No  Neurological Headaches?:  Yes Dizziness?: No  Psychologic Depression?: Yes Anxiety?: Yes  Physical Exam: BP 123/74   Pulse 73   Resp 16   Ht 5\' 3"  (1.6 m)   Wt 154 lb 6.4 oz (70 kg)   BMI 27.35 kg/m   Constitutional: Well nourished. Alert and oriented, No acute distress. HEENT: [place] AT, moist mucus membranes. Trachea midline, no masses. Cardiovascular: No clubbing, cyanosis, or edema. Respiratory: Normal respiratory effort, no increased work of breathing. Skin: No rashes, bruises or suspicious lesions. Lymph: No cervical or inguinal adenopathy. Neurologic: Grossly intact, no focal deficits, moving all 4 extremities. Psychiatric: Normal mood and affect.  Laboratory Data: Lab Results  Component Value Date   WBC 10.6 09/10/2017   HGB 11.8 (L) 09/10/2017   HCT 34.9 (L) 09/10/2017   MCV 91.8 09/10/2017   PLT 273 09/10/2017    Lab Results  Component Value Date   CREATININE  1.18 (H) 09/10/2017    Lab Results  Component Value Date   AST 26 07/25/2017   Lab Results  Component Value Date   ALT 19 07/25/2017    I have reviewed the labs.  Pertinent Imaging CLINICAL DATA:  Recurrent UTIs  EXAM: RENAL / URINARY TRACT ULTRASOUND COMPLETE  COMPARISON:  None.  FINDINGS: Right Kidney:  Length: 9.8 cm. Echogenic renal parenchyma. No mass or hydronephrosis.  Left Kidney:  Length: 10.3 cm. Echogenic renal parenchyma. No mass or hydronephrosis.  Bladder:  Within normal limits.  IMPRESSION: Echogenic renal parenchyma, raising the possibility of medical renal disease.  No hydronephrosis.   Electronically Signed   By: Julian Hy M.D.   On: 10/27/2017 09:56 I have independently reviewed the films   Assessment & Plan:    1. Recurrent UTI's Patient's caregiver [name] states that she continues to have foul-smelling urine   2. Vaginal atrophy Only using the vaginal estrogen cream as needed   3. Incontinence Occurs mostly at night  She has failed OAB  medications Discussed contacting PCP for further discussion for a sleep study but due to patient dementia, it may not be feasible for her to undergo the study  4. CKD Findings for medical disease seen on RUS  Advised to contact PCP for further follow up            Return if symptoms worsen or fail to improve.  These notes generated with voice recognition software. I apologize for typographical errors.  Zara Council, PA-C  [facility] [address] [place], [place] [address] [phone]

## 2017-11-02 DIAGNOSIS — K219 Gastro-esophageal reflux disease without esophagitis: Secondary | ICD-10-CM | POA: Diagnosis not present

## 2017-11-02 DIAGNOSIS — Z87440 Personal history of urinary (tract) infections: Secondary | ICD-10-CM | POA: Diagnosis not present

## 2017-11-02 DIAGNOSIS — G309 Alzheimer's disease, unspecified: Secondary | ICD-10-CM | POA: Diagnosis not present

## 2017-11-02 DIAGNOSIS — Z9181 History of falling: Secondary | ICD-10-CM | POA: Diagnosis not present

## 2017-11-02 DIAGNOSIS — F0280 Dementia in other diseases classified elsewhere without behavioral disturbance: Secondary | ICD-10-CM | POA: Diagnosis not present

## 2017-11-02 DIAGNOSIS — R32 Unspecified urinary incontinence: Secondary | ICD-10-CM | POA: Diagnosis not present

## 2017-11-02 DIAGNOSIS — Z7982 Long term (current) use of aspirin: Secondary | ICD-10-CM | POA: Diagnosis not present

## 2017-11-02 DIAGNOSIS — D500 Iron deficiency anemia secondary to blood loss (chronic): Secondary | ICD-10-CM | POA: Diagnosis not present

## 2017-11-05 DIAGNOSIS — Z87440 Personal history of urinary (tract) infections: Secondary | ICD-10-CM | POA: Diagnosis not present

## 2017-11-05 DIAGNOSIS — K219 Gastro-esophageal reflux disease without esophagitis: Secondary | ICD-10-CM | POA: Diagnosis not present

## 2017-11-05 DIAGNOSIS — D500 Iron deficiency anemia secondary to blood loss (chronic): Secondary | ICD-10-CM | POA: Diagnosis not present

## 2017-11-05 DIAGNOSIS — F0280 Dementia in other diseases classified elsewhere without behavioral disturbance: Secondary | ICD-10-CM | POA: Diagnosis not present

## 2017-11-05 DIAGNOSIS — G309 Alzheimer's disease, unspecified: Secondary | ICD-10-CM | POA: Diagnosis not present

## 2017-11-05 DIAGNOSIS — Z9181 History of falling: Secondary | ICD-10-CM | POA: Diagnosis not present

## 2017-11-05 DIAGNOSIS — R32 Unspecified urinary incontinence: Secondary | ICD-10-CM | POA: Diagnosis not present

## 2017-11-05 DIAGNOSIS — Z7982 Long term (current) use of aspirin: Secondary | ICD-10-CM | POA: Diagnosis not present

## 2017-11-06 DIAGNOSIS — G300 Alzheimer's disease with early onset: Secondary | ICD-10-CM | POA: Diagnosis not present

## 2017-11-06 DIAGNOSIS — Z515 Encounter for palliative care: Secondary | ICD-10-CM | POA: Diagnosis not present

## 2017-11-07 DIAGNOSIS — D500 Iron deficiency anemia secondary to blood loss (chronic): Secondary | ICD-10-CM | POA: Diagnosis not present

## 2017-11-07 DIAGNOSIS — G309 Alzheimer's disease, unspecified: Secondary | ICD-10-CM | POA: Diagnosis not present

## 2017-11-07 DIAGNOSIS — Z87440 Personal history of urinary (tract) infections: Secondary | ICD-10-CM | POA: Diagnosis not present

## 2017-11-07 DIAGNOSIS — K219 Gastro-esophageal reflux disease without esophagitis: Secondary | ICD-10-CM | POA: Diagnosis not present

## 2017-11-07 DIAGNOSIS — Z7982 Long term (current) use of aspirin: Secondary | ICD-10-CM | POA: Diagnosis not present

## 2017-11-07 DIAGNOSIS — R32 Unspecified urinary incontinence: Secondary | ICD-10-CM | POA: Diagnosis not present

## 2017-11-07 DIAGNOSIS — F0280 Dementia in other diseases classified elsewhere without behavioral disturbance: Secondary | ICD-10-CM | POA: Diagnosis not present

## 2017-11-07 DIAGNOSIS — Z9181 History of falling: Secondary | ICD-10-CM | POA: Diagnosis not present

## 2017-11-08 DIAGNOSIS — Z9181 History of falling: Secondary | ICD-10-CM | POA: Diagnosis not present

## 2017-11-08 DIAGNOSIS — Z7982 Long term (current) use of aspirin: Secondary | ICD-10-CM | POA: Diagnosis not present

## 2017-11-08 DIAGNOSIS — R32 Unspecified urinary incontinence: Secondary | ICD-10-CM | POA: Diagnosis not present

## 2017-11-08 DIAGNOSIS — Z87440 Personal history of urinary (tract) infections: Secondary | ICD-10-CM | POA: Diagnosis not present

## 2017-11-08 DIAGNOSIS — K219 Gastro-esophageal reflux disease without esophagitis: Secondary | ICD-10-CM | POA: Diagnosis not present

## 2017-11-08 DIAGNOSIS — F0280 Dementia in other diseases classified elsewhere without behavioral disturbance: Secondary | ICD-10-CM | POA: Diagnosis not present

## 2017-11-08 DIAGNOSIS — G309 Alzheimer's disease, unspecified: Secondary | ICD-10-CM | POA: Diagnosis not present

## 2017-11-08 DIAGNOSIS — D500 Iron deficiency anemia secondary to blood loss (chronic): Secondary | ICD-10-CM | POA: Diagnosis not present

## 2017-11-12 DIAGNOSIS — G309 Alzheimer's disease, unspecified: Secondary | ICD-10-CM | POA: Diagnosis not present

## 2017-11-12 DIAGNOSIS — Z87440 Personal history of urinary (tract) infections: Secondary | ICD-10-CM | POA: Diagnosis not present

## 2017-11-12 DIAGNOSIS — R32 Unspecified urinary incontinence: Secondary | ICD-10-CM | POA: Diagnosis not present

## 2017-11-12 DIAGNOSIS — Z9181 History of falling: Secondary | ICD-10-CM | POA: Diagnosis not present

## 2017-11-12 DIAGNOSIS — Z7982 Long term (current) use of aspirin: Secondary | ICD-10-CM | POA: Diagnosis not present

## 2017-11-12 DIAGNOSIS — F0280 Dementia in other diseases classified elsewhere without behavioral disturbance: Secondary | ICD-10-CM | POA: Diagnosis not present

## 2017-11-12 DIAGNOSIS — D500 Iron deficiency anemia secondary to blood loss (chronic): Secondary | ICD-10-CM | POA: Diagnosis not present

## 2017-11-12 DIAGNOSIS — K219 Gastro-esophageal reflux disease without esophagitis: Secondary | ICD-10-CM | POA: Diagnosis not present

## 2017-11-14 DIAGNOSIS — D500 Iron deficiency anemia secondary to blood loss (chronic): Secondary | ICD-10-CM | POA: Diagnosis not present

## 2017-11-14 DIAGNOSIS — G309 Alzheimer's disease, unspecified: Secondary | ICD-10-CM | POA: Diagnosis not present

## 2017-11-14 DIAGNOSIS — Z87440 Personal history of urinary (tract) infections: Secondary | ICD-10-CM | POA: Diagnosis not present

## 2017-11-14 DIAGNOSIS — Z9181 History of falling: Secondary | ICD-10-CM | POA: Diagnosis not present

## 2017-11-14 DIAGNOSIS — R413 Other amnesia: Secondary | ICD-10-CM | POA: Diagnosis not present

## 2017-11-14 DIAGNOSIS — R32 Unspecified urinary incontinence: Secondary | ICD-10-CM | POA: Diagnosis not present

## 2017-11-14 DIAGNOSIS — K219 Gastro-esophageal reflux disease without esophagitis: Secondary | ICD-10-CM | POA: Diagnosis not present

## 2017-11-14 DIAGNOSIS — Z7982 Long term (current) use of aspirin: Secondary | ICD-10-CM | POA: Diagnosis not present

## 2017-11-14 DIAGNOSIS — R296 Repeated falls: Secondary | ICD-10-CM | POA: Diagnosis not present

## 2017-11-14 DIAGNOSIS — R350 Frequency of micturition: Secondary | ICD-10-CM | POA: Diagnosis not present

## 2017-11-14 DIAGNOSIS — F0280 Dementia in other diseases classified elsewhere without behavioral disturbance: Secondary | ICD-10-CM | POA: Diagnosis not present

## 2017-11-15 DIAGNOSIS — G309 Alzheimer's disease, unspecified: Secondary | ICD-10-CM | POA: Diagnosis not present

## 2017-11-15 DIAGNOSIS — D500 Iron deficiency anemia secondary to blood loss (chronic): Secondary | ICD-10-CM | POA: Diagnosis not present

## 2017-11-15 DIAGNOSIS — R413 Other amnesia: Secondary | ICD-10-CM | POA: Diagnosis not present

## 2017-11-15 DIAGNOSIS — R296 Repeated falls: Secondary | ICD-10-CM | POA: Diagnosis not present

## 2017-11-15 DIAGNOSIS — R350 Frequency of micturition: Secondary | ICD-10-CM | POA: Diagnosis not present

## 2017-11-15 DIAGNOSIS — Z9181 History of falling: Secondary | ICD-10-CM | POA: Diagnosis not present

## 2017-11-15 DIAGNOSIS — K219 Gastro-esophageal reflux disease without esophagitis: Secondary | ICD-10-CM | POA: Diagnosis not present

## 2017-11-15 DIAGNOSIS — Z7982 Long term (current) use of aspirin: Secondary | ICD-10-CM | POA: Diagnosis not present

## 2017-11-15 DIAGNOSIS — Z87440 Personal history of urinary (tract) infections: Secondary | ICD-10-CM | POA: Diagnosis not present

## 2017-11-15 DIAGNOSIS — R32 Unspecified urinary incontinence: Secondary | ICD-10-CM | POA: Diagnosis not present

## 2017-11-15 DIAGNOSIS — F0280 Dementia in other diseases classified elsewhere without behavioral disturbance: Secondary | ICD-10-CM | POA: Diagnosis not present

## 2017-11-18 DIAGNOSIS — J069 Acute upper respiratory infection, unspecified: Secondary | ICD-10-CM | POA: Diagnosis not present

## 2017-11-19 DIAGNOSIS — Z87440 Personal history of urinary (tract) infections: Secondary | ICD-10-CM | POA: Diagnosis not present

## 2017-11-19 DIAGNOSIS — R32 Unspecified urinary incontinence: Secondary | ICD-10-CM | POA: Diagnosis not present

## 2017-11-19 DIAGNOSIS — Z9181 History of falling: Secondary | ICD-10-CM | POA: Diagnosis not present

## 2017-11-19 DIAGNOSIS — K219 Gastro-esophageal reflux disease without esophagitis: Secondary | ICD-10-CM | POA: Diagnosis not present

## 2017-11-19 DIAGNOSIS — G309 Alzheimer's disease, unspecified: Secondary | ICD-10-CM | POA: Diagnosis not present

## 2017-11-19 DIAGNOSIS — Z7982 Long term (current) use of aspirin: Secondary | ICD-10-CM | POA: Diagnosis not present

## 2017-11-19 DIAGNOSIS — D500 Iron deficiency anemia secondary to blood loss (chronic): Secondary | ICD-10-CM | POA: Diagnosis not present

## 2017-11-19 DIAGNOSIS — F0280 Dementia in other diseases classified elsewhere without behavioral disturbance: Secondary | ICD-10-CM | POA: Diagnosis not present

## 2017-11-20 DIAGNOSIS — D500 Iron deficiency anemia secondary to blood loss (chronic): Secondary | ICD-10-CM | POA: Diagnosis not present

## 2017-11-20 DIAGNOSIS — Z87440 Personal history of urinary (tract) infections: Secondary | ICD-10-CM | POA: Diagnosis not present

## 2017-11-20 DIAGNOSIS — Z7982 Long term (current) use of aspirin: Secondary | ICD-10-CM | POA: Diagnosis not present

## 2017-11-20 DIAGNOSIS — Z9181 History of falling: Secondary | ICD-10-CM | POA: Diagnosis not present

## 2017-11-20 DIAGNOSIS — R32 Unspecified urinary incontinence: Secondary | ICD-10-CM | POA: Diagnosis not present

## 2017-11-20 DIAGNOSIS — K219 Gastro-esophageal reflux disease without esophagitis: Secondary | ICD-10-CM | POA: Diagnosis not present

## 2017-11-20 DIAGNOSIS — F0280 Dementia in other diseases classified elsewhere without behavioral disturbance: Secondary | ICD-10-CM | POA: Diagnosis not present

## 2017-11-20 DIAGNOSIS — G309 Alzheimer's disease, unspecified: Secondary | ICD-10-CM | POA: Diagnosis not present

## 2017-11-22 DIAGNOSIS — Z9181 History of falling: Secondary | ICD-10-CM | POA: Diagnosis not present

## 2017-11-22 DIAGNOSIS — R32 Unspecified urinary incontinence: Secondary | ICD-10-CM | POA: Diagnosis not present

## 2017-11-22 DIAGNOSIS — D500 Iron deficiency anemia secondary to blood loss (chronic): Secondary | ICD-10-CM | POA: Diagnosis not present

## 2017-11-22 DIAGNOSIS — N760 Acute vaginitis: Secondary | ICD-10-CM | POA: Diagnosis not present

## 2017-11-22 DIAGNOSIS — Z87440 Personal history of urinary (tract) infections: Secondary | ICD-10-CM | POA: Diagnosis not present

## 2017-11-22 DIAGNOSIS — B9689 Other specified bacterial agents as the cause of diseases classified elsewhere: Secondary | ICD-10-CM | POA: Diagnosis not present

## 2017-11-22 DIAGNOSIS — G309 Alzheimer's disease, unspecified: Secondary | ICD-10-CM | POA: Diagnosis not present

## 2017-11-22 DIAGNOSIS — Z7982 Long term (current) use of aspirin: Secondary | ICD-10-CM | POA: Diagnosis not present

## 2017-11-22 DIAGNOSIS — N898 Other specified noninflammatory disorders of vagina: Secondary | ICD-10-CM | POA: Diagnosis not present

## 2017-11-22 DIAGNOSIS — F0280 Dementia in other diseases classified elsewhere without behavioral disturbance: Secondary | ICD-10-CM | POA: Diagnosis not present

## 2017-11-22 DIAGNOSIS — K219 Gastro-esophageal reflux disease without esophagitis: Secondary | ICD-10-CM | POA: Diagnosis not present

## 2017-11-22 DIAGNOSIS — R319 Hematuria, unspecified: Secondary | ICD-10-CM | POA: Diagnosis not present

## 2017-11-23 DIAGNOSIS — Z87440 Personal history of urinary (tract) infections: Secondary | ICD-10-CM | POA: Diagnosis not present

## 2017-11-23 DIAGNOSIS — Z9181 History of falling: Secondary | ICD-10-CM | POA: Diagnosis not present

## 2017-11-23 DIAGNOSIS — D500 Iron deficiency anemia secondary to blood loss (chronic): Secondary | ICD-10-CM | POA: Diagnosis not present

## 2017-11-23 DIAGNOSIS — G309 Alzheimer's disease, unspecified: Secondary | ICD-10-CM | POA: Diagnosis not present

## 2017-11-23 DIAGNOSIS — R32 Unspecified urinary incontinence: Secondary | ICD-10-CM | POA: Diagnosis not present

## 2017-11-23 DIAGNOSIS — F0280 Dementia in other diseases classified elsewhere without behavioral disturbance: Secondary | ICD-10-CM | POA: Diagnosis not present

## 2017-11-23 DIAGNOSIS — Z7982 Long term (current) use of aspirin: Secondary | ICD-10-CM | POA: Diagnosis not present

## 2017-11-23 DIAGNOSIS — K219 Gastro-esophageal reflux disease without esophagitis: Secondary | ICD-10-CM | POA: Diagnosis not present

## 2017-11-26 DIAGNOSIS — Z87440 Personal history of urinary (tract) infections: Secondary | ICD-10-CM | POA: Diagnosis not present

## 2017-11-26 DIAGNOSIS — G309 Alzheimer's disease, unspecified: Secondary | ICD-10-CM | POA: Diagnosis not present

## 2017-11-26 DIAGNOSIS — R32 Unspecified urinary incontinence: Secondary | ICD-10-CM | POA: Diagnosis not present

## 2017-11-26 DIAGNOSIS — Z7982 Long term (current) use of aspirin: Secondary | ICD-10-CM | POA: Diagnosis not present

## 2017-11-26 DIAGNOSIS — F0280 Dementia in other diseases classified elsewhere without behavioral disturbance: Secondary | ICD-10-CM | POA: Diagnosis not present

## 2017-11-26 DIAGNOSIS — Z9181 History of falling: Secondary | ICD-10-CM | POA: Diagnosis not present

## 2017-11-26 DIAGNOSIS — D500 Iron deficiency anemia secondary to blood loss (chronic): Secondary | ICD-10-CM | POA: Diagnosis not present

## 2017-11-26 DIAGNOSIS — K219 Gastro-esophageal reflux disease without esophagitis: Secondary | ICD-10-CM | POA: Diagnosis not present

## 2017-11-29 DIAGNOSIS — Z9181 History of falling: Secondary | ICD-10-CM | POA: Diagnosis not present

## 2017-11-29 DIAGNOSIS — G309 Alzheimer's disease, unspecified: Secondary | ICD-10-CM | POA: Diagnosis not present

## 2017-11-29 DIAGNOSIS — D500 Iron deficiency anemia secondary to blood loss (chronic): Secondary | ICD-10-CM | POA: Diagnosis not present

## 2017-11-29 DIAGNOSIS — Z87440 Personal history of urinary (tract) infections: Secondary | ICD-10-CM | POA: Diagnosis not present

## 2017-11-29 DIAGNOSIS — F0280 Dementia in other diseases classified elsewhere without behavioral disturbance: Secondary | ICD-10-CM | POA: Diagnosis not present

## 2017-11-29 DIAGNOSIS — Z7982 Long term (current) use of aspirin: Secondary | ICD-10-CM | POA: Diagnosis not present

## 2017-11-29 DIAGNOSIS — R32 Unspecified urinary incontinence: Secondary | ICD-10-CM | POA: Diagnosis not present

## 2017-11-29 DIAGNOSIS — K219 Gastro-esophageal reflux disease without esophagitis: Secondary | ICD-10-CM | POA: Diagnosis not present

## 2017-11-30 DIAGNOSIS — F0280 Dementia in other diseases classified elsewhere without behavioral disturbance: Secondary | ICD-10-CM | POA: Diagnosis not present

## 2017-11-30 DIAGNOSIS — G309 Alzheimer's disease, unspecified: Secondary | ICD-10-CM | POA: Diagnosis not present

## 2017-11-30 DIAGNOSIS — R32 Unspecified urinary incontinence: Secondary | ICD-10-CM | POA: Diagnosis not present

## 2017-11-30 DIAGNOSIS — Z9181 History of falling: Secondary | ICD-10-CM | POA: Diagnosis not present

## 2017-11-30 DIAGNOSIS — D500 Iron deficiency anemia secondary to blood loss (chronic): Secondary | ICD-10-CM | POA: Diagnosis not present

## 2017-11-30 DIAGNOSIS — Z7982 Long term (current) use of aspirin: Secondary | ICD-10-CM | POA: Diagnosis not present

## 2017-11-30 DIAGNOSIS — Z87440 Personal history of urinary (tract) infections: Secondary | ICD-10-CM | POA: Diagnosis not present

## 2017-11-30 DIAGNOSIS — K219 Gastro-esophageal reflux disease without esophagitis: Secondary | ICD-10-CM | POA: Diagnosis not present

## 2017-12-03 DIAGNOSIS — R32 Unspecified urinary incontinence: Secondary | ICD-10-CM | POA: Diagnosis not present

## 2017-12-03 DIAGNOSIS — Z87440 Personal history of urinary (tract) infections: Secondary | ICD-10-CM | POA: Diagnosis not present

## 2017-12-03 DIAGNOSIS — G309 Alzheimer's disease, unspecified: Secondary | ICD-10-CM | POA: Diagnosis not present

## 2017-12-03 DIAGNOSIS — D500 Iron deficiency anemia secondary to blood loss (chronic): Secondary | ICD-10-CM | POA: Diagnosis not present

## 2017-12-03 DIAGNOSIS — F0280 Dementia in other diseases classified elsewhere without behavioral disturbance: Secondary | ICD-10-CM | POA: Diagnosis not present

## 2017-12-03 DIAGNOSIS — Z9181 History of falling: Secondary | ICD-10-CM | POA: Diagnosis not present

## 2017-12-03 DIAGNOSIS — K219 Gastro-esophageal reflux disease without esophagitis: Secondary | ICD-10-CM | POA: Diagnosis not present

## 2017-12-03 DIAGNOSIS — Z7982 Long term (current) use of aspirin: Secondary | ICD-10-CM | POA: Diagnosis not present

## 2017-12-06 DIAGNOSIS — Z7982 Long term (current) use of aspirin: Secondary | ICD-10-CM | POA: Diagnosis not present

## 2017-12-06 DIAGNOSIS — Z9181 History of falling: Secondary | ICD-10-CM | POA: Diagnosis not present

## 2017-12-06 DIAGNOSIS — K219 Gastro-esophageal reflux disease without esophagitis: Secondary | ICD-10-CM | POA: Diagnosis not present

## 2017-12-06 DIAGNOSIS — Z87440 Personal history of urinary (tract) infections: Secondary | ICD-10-CM | POA: Diagnosis not present

## 2017-12-06 DIAGNOSIS — G309 Alzheimer's disease, unspecified: Secondary | ICD-10-CM | POA: Diagnosis not present

## 2017-12-06 DIAGNOSIS — R32 Unspecified urinary incontinence: Secondary | ICD-10-CM | POA: Diagnosis not present

## 2017-12-06 DIAGNOSIS — F0280 Dementia in other diseases classified elsewhere without behavioral disturbance: Secondary | ICD-10-CM | POA: Diagnosis not present

## 2017-12-06 DIAGNOSIS — D500 Iron deficiency anemia secondary to blood loss (chronic): Secondary | ICD-10-CM | POA: Diagnosis not present

## 2017-12-10 DIAGNOSIS — D500 Iron deficiency anemia secondary to blood loss (chronic): Secondary | ICD-10-CM | POA: Diagnosis not present

## 2017-12-10 DIAGNOSIS — Z9181 History of falling: Secondary | ICD-10-CM | POA: Diagnosis not present

## 2017-12-10 DIAGNOSIS — K219 Gastro-esophageal reflux disease without esophagitis: Secondary | ICD-10-CM | POA: Diagnosis not present

## 2017-12-10 DIAGNOSIS — R32 Unspecified urinary incontinence: Secondary | ICD-10-CM | POA: Diagnosis not present

## 2017-12-10 DIAGNOSIS — Z87440 Personal history of urinary (tract) infections: Secondary | ICD-10-CM | POA: Diagnosis not present

## 2017-12-10 DIAGNOSIS — F0280 Dementia in other diseases classified elsewhere without behavioral disturbance: Secondary | ICD-10-CM | POA: Diagnosis not present

## 2017-12-10 DIAGNOSIS — G309 Alzheimer's disease, unspecified: Secondary | ICD-10-CM | POA: Diagnosis not present

## 2017-12-10 DIAGNOSIS — Z7982 Long term (current) use of aspirin: Secondary | ICD-10-CM | POA: Diagnosis not present

## 2017-12-11 DIAGNOSIS — Z87440 Personal history of urinary (tract) infections: Secondary | ICD-10-CM | POA: Diagnosis not present

## 2017-12-11 DIAGNOSIS — K219 Gastro-esophageal reflux disease without esophagitis: Secondary | ICD-10-CM | POA: Diagnosis not present

## 2017-12-11 DIAGNOSIS — R32 Unspecified urinary incontinence: Secondary | ICD-10-CM | POA: Diagnosis not present

## 2017-12-11 DIAGNOSIS — Z9181 History of falling: Secondary | ICD-10-CM | POA: Diagnosis not present

## 2017-12-11 DIAGNOSIS — G309 Alzheimer's disease, unspecified: Secondary | ICD-10-CM | POA: Diagnosis not present

## 2017-12-11 DIAGNOSIS — D500 Iron deficiency anemia secondary to blood loss (chronic): Secondary | ICD-10-CM | POA: Diagnosis not present

## 2017-12-11 DIAGNOSIS — Z7982 Long term (current) use of aspirin: Secondary | ICD-10-CM | POA: Diagnosis not present

## 2017-12-11 DIAGNOSIS — F0280 Dementia in other diseases classified elsewhere without behavioral disturbance: Secondary | ICD-10-CM | POA: Diagnosis not present

## 2017-12-13 DIAGNOSIS — R32 Unspecified urinary incontinence: Secondary | ICD-10-CM | POA: Diagnosis not present

## 2017-12-13 DIAGNOSIS — D500 Iron deficiency anemia secondary to blood loss (chronic): Secondary | ICD-10-CM | POA: Diagnosis not present

## 2017-12-13 DIAGNOSIS — G309 Alzheimer's disease, unspecified: Secondary | ICD-10-CM | POA: Diagnosis not present

## 2017-12-13 DIAGNOSIS — Z87440 Personal history of urinary (tract) infections: Secondary | ICD-10-CM | POA: Diagnosis not present

## 2017-12-13 DIAGNOSIS — K219 Gastro-esophageal reflux disease without esophagitis: Secondary | ICD-10-CM | POA: Diagnosis not present

## 2017-12-13 DIAGNOSIS — Z7982 Long term (current) use of aspirin: Secondary | ICD-10-CM | POA: Diagnosis not present

## 2017-12-13 DIAGNOSIS — Z9181 History of falling: Secondary | ICD-10-CM | POA: Diagnosis not present

## 2017-12-13 DIAGNOSIS — F0280 Dementia in other diseases classified elsewhere without behavioral disturbance: Secondary | ICD-10-CM | POA: Diagnosis not present

## 2017-12-17 DIAGNOSIS — R32 Unspecified urinary incontinence: Secondary | ICD-10-CM | POA: Diagnosis not present

## 2017-12-17 DIAGNOSIS — Z87440 Personal history of urinary (tract) infections: Secondary | ICD-10-CM | POA: Diagnosis not present

## 2017-12-17 DIAGNOSIS — K219 Gastro-esophageal reflux disease without esophagitis: Secondary | ICD-10-CM | POA: Diagnosis not present

## 2017-12-17 DIAGNOSIS — Z9181 History of falling: Secondary | ICD-10-CM | POA: Diagnosis not present

## 2017-12-17 DIAGNOSIS — F0280 Dementia in other diseases classified elsewhere without behavioral disturbance: Secondary | ICD-10-CM | POA: Diagnosis not present

## 2017-12-17 DIAGNOSIS — G309 Alzheimer's disease, unspecified: Secondary | ICD-10-CM | POA: Diagnosis not present

## 2017-12-17 DIAGNOSIS — Z7982 Long term (current) use of aspirin: Secondary | ICD-10-CM | POA: Diagnosis not present

## 2017-12-17 DIAGNOSIS — D500 Iron deficiency anemia secondary to blood loss (chronic): Secondary | ICD-10-CM | POA: Diagnosis not present

## 2017-12-19 DIAGNOSIS — N3941 Urge incontinence: Secondary | ICD-10-CM | POA: Diagnosis not present

## 2017-12-25 DIAGNOSIS — G309 Alzheimer's disease, unspecified: Secondary | ICD-10-CM | POA: Diagnosis not present

## 2017-12-26 DIAGNOSIS — G309 Alzheimer's disease, unspecified: Secondary | ICD-10-CM | POA: Diagnosis not present

## 2017-12-27 DIAGNOSIS — G309 Alzheimer's disease, unspecified: Secondary | ICD-10-CM | POA: Diagnosis not present

## 2017-12-28 DIAGNOSIS — G309 Alzheimer's disease, unspecified: Secondary | ICD-10-CM | POA: Diagnosis not present

## 2017-12-29 DIAGNOSIS — G309 Alzheimer's disease, unspecified: Secondary | ICD-10-CM | POA: Diagnosis not present

## 2017-12-30 DIAGNOSIS — G309 Alzheimer's disease, unspecified: Secondary | ICD-10-CM | POA: Diagnosis not present

## 2017-12-31 DIAGNOSIS — G309 Alzheimer's disease, unspecified: Secondary | ICD-10-CM | POA: Diagnosis not present

## 2018-01-01 DIAGNOSIS — G309 Alzheimer's disease, unspecified: Secondary | ICD-10-CM | POA: Diagnosis not present

## 2018-01-02 DIAGNOSIS — G309 Alzheimer's disease, unspecified: Secondary | ICD-10-CM | POA: Diagnosis not present

## 2018-01-03 DIAGNOSIS — G309 Alzheimer's disease, unspecified: Secondary | ICD-10-CM | POA: Diagnosis not present

## 2018-01-04 DIAGNOSIS — G309 Alzheimer's disease, unspecified: Secondary | ICD-10-CM | POA: Diagnosis not present

## 2018-01-05 DIAGNOSIS — G309 Alzheimer's disease, unspecified: Secondary | ICD-10-CM | POA: Diagnosis not present

## 2018-01-06 DIAGNOSIS — G309 Alzheimer's disease, unspecified: Secondary | ICD-10-CM | POA: Diagnosis not present

## 2018-01-14 DIAGNOSIS — G309 Alzheimer's disease, unspecified: Secondary | ICD-10-CM | POA: Diagnosis not present

## 2018-01-15 DIAGNOSIS — G309 Alzheimer's disease, unspecified: Secondary | ICD-10-CM | POA: Diagnosis not present

## 2018-01-16 DIAGNOSIS — G309 Alzheimer's disease, unspecified: Secondary | ICD-10-CM | POA: Diagnosis not present

## 2018-01-17 DIAGNOSIS — G309 Alzheimer's disease, unspecified: Secondary | ICD-10-CM | POA: Diagnosis not present

## 2018-01-18 DIAGNOSIS — G309 Alzheimer's disease, unspecified: Secondary | ICD-10-CM | POA: Diagnosis not present

## 2018-01-19 DIAGNOSIS — G309 Alzheimer's disease, unspecified: Secondary | ICD-10-CM | POA: Diagnosis not present

## 2018-01-20 DIAGNOSIS — G309 Alzheimer's disease, unspecified: Secondary | ICD-10-CM | POA: Diagnosis not present

## 2018-01-21 DIAGNOSIS — G309 Alzheimer's disease, unspecified: Secondary | ICD-10-CM | POA: Diagnosis not present

## 2018-01-22 DIAGNOSIS — G309 Alzheimer's disease, unspecified: Secondary | ICD-10-CM | POA: Diagnosis not present

## 2018-01-23 DIAGNOSIS — G309 Alzheimer's disease, unspecified: Secondary | ICD-10-CM | POA: Diagnosis not present

## 2018-01-24 DIAGNOSIS — N3941 Urge incontinence: Secondary | ICD-10-CM | POA: Diagnosis not present

## 2018-01-24 DIAGNOSIS — G309 Alzheimer's disease, unspecified: Secondary | ICD-10-CM | POA: Diagnosis not present

## 2018-01-25 DIAGNOSIS — G309 Alzheimer's disease, unspecified: Secondary | ICD-10-CM | POA: Diagnosis not present

## 2018-01-26 DIAGNOSIS — G309 Alzheimer's disease, unspecified: Secondary | ICD-10-CM | POA: Diagnosis not present

## 2018-01-27 DIAGNOSIS — G309 Alzheimer's disease, unspecified: Secondary | ICD-10-CM | POA: Diagnosis not present

## 2018-01-28 DIAGNOSIS — G309 Alzheimer's disease, unspecified: Secondary | ICD-10-CM | POA: Diagnosis not present

## 2018-01-29 DIAGNOSIS — G309 Alzheimer's disease, unspecified: Secondary | ICD-10-CM | POA: Diagnosis not present

## 2018-01-30 DIAGNOSIS — G309 Alzheimer's disease, unspecified: Secondary | ICD-10-CM | POA: Diagnosis not present

## 2018-01-31 DIAGNOSIS — G309 Alzheimer's disease, unspecified: Secondary | ICD-10-CM | POA: Diagnosis not present

## 2018-02-01 DIAGNOSIS — G309 Alzheimer's disease, unspecified: Secondary | ICD-10-CM | POA: Diagnosis not present

## 2018-02-02 DIAGNOSIS — G309 Alzheimer's disease, unspecified: Secondary | ICD-10-CM | POA: Diagnosis not present

## 2018-02-03 DIAGNOSIS — G309 Alzheimer's disease, unspecified: Secondary | ICD-10-CM | POA: Diagnosis not present

## 2018-02-04 DIAGNOSIS — G309 Alzheimer's disease, unspecified: Secondary | ICD-10-CM | POA: Diagnosis not present

## 2018-02-05 DIAGNOSIS — G309 Alzheimer's disease, unspecified: Secondary | ICD-10-CM | POA: Diagnosis not present

## 2018-02-06 DIAGNOSIS — G309 Alzheimer's disease, unspecified: Secondary | ICD-10-CM | POA: Diagnosis not present

## 2018-02-07 DIAGNOSIS — G309 Alzheimer's disease, unspecified: Secondary | ICD-10-CM | POA: Diagnosis not present

## 2018-02-08 DIAGNOSIS — G309 Alzheimer's disease, unspecified: Secondary | ICD-10-CM | POA: Diagnosis not present

## 2018-02-09 DIAGNOSIS — G309 Alzheimer's disease, unspecified: Secondary | ICD-10-CM | POA: Diagnosis not present

## 2018-02-10 DIAGNOSIS — G309 Alzheimer's disease, unspecified: Secondary | ICD-10-CM | POA: Diagnosis not present

## 2018-02-11 DIAGNOSIS — G309 Alzheimer's disease, unspecified: Secondary | ICD-10-CM | POA: Diagnosis not present

## 2018-02-12 DIAGNOSIS — G309 Alzheimer's disease, unspecified: Secondary | ICD-10-CM | POA: Diagnosis not present

## 2018-02-13 DIAGNOSIS — G309 Alzheimer's disease, unspecified: Secondary | ICD-10-CM | POA: Diagnosis not present

## 2018-02-14 DIAGNOSIS — G309 Alzheimer's disease, unspecified: Secondary | ICD-10-CM | POA: Diagnosis not present

## 2018-02-15 DIAGNOSIS — G309 Alzheimer's disease, unspecified: Secondary | ICD-10-CM | POA: Diagnosis not present

## 2018-02-16 DIAGNOSIS — G309 Alzheimer's disease, unspecified: Secondary | ICD-10-CM | POA: Diagnosis not present

## 2018-02-17 DIAGNOSIS — G309 Alzheimer's disease, unspecified: Secondary | ICD-10-CM | POA: Diagnosis not present

## 2018-02-18 DIAGNOSIS — G309 Alzheimer's disease, unspecified: Secondary | ICD-10-CM | POA: Diagnosis not present

## 2018-02-19 DIAGNOSIS — G309 Alzheimer's disease, unspecified: Secondary | ICD-10-CM | POA: Diagnosis not present

## 2018-02-20 DIAGNOSIS — G309 Alzheimer's disease, unspecified: Secondary | ICD-10-CM | POA: Diagnosis not present

## 2018-02-21 DIAGNOSIS — G309 Alzheimer's disease, unspecified: Secondary | ICD-10-CM | POA: Diagnosis not present

## 2018-02-22 DIAGNOSIS — G309 Alzheimer's disease, unspecified: Secondary | ICD-10-CM | POA: Diagnosis not present

## 2018-02-23 DIAGNOSIS — G309 Alzheimer's disease, unspecified: Secondary | ICD-10-CM | POA: Diagnosis not present

## 2018-02-24 DIAGNOSIS — G309 Alzheimer's disease, unspecified: Secondary | ICD-10-CM | POA: Diagnosis not present

## 2018-03-04 DIAGNOSIS — G300 Alzheimer's disease with early onset: Secondary | ICD-10-CM | POA: Diagnosis not present

## 2018-03-04 DIAGNOSIS — D649 Anemia, unspecified: Secondary | ICD-10-CM | POA: Diagnosis not present

## 2018-03-04 DIAGNOSIS — E7800 Pure hypercholesterolemia, unspecified: Secondary | ICD-10-CM | POA: Diagnosis not present

## 2018-03-04 DIAGNOSIS — F0280 Dementia in other diseases classified elsewhere without behavioral disturbance: Secondary | ICD-10-CM | POA: Diagnosis not present

## 2018-03-04 DIAGNOSIS — J302 Other seasonal allergic rhinitis: Secondary | ICD-10-CM | POA: Diagnosis not present

## 2018-03-04 DIAGNOSIS — R296 Repeated falls: Secondary | ICD-10-CM | POA: Diagnosis not present

## 2018-03-04 DIAGNOSIS — G309 Alzheimer's disease, unspecified: Secondary | ICD-10-CM | POA: Diagnosis not present

## 2018-03-04 DIAGNOSIS — D500 Iron deficiency anemia secondary to blood loss (chronic): Secondary | ICD-10-CM | POA: Diagnosis not present

## 2018-03-04 DIAGNOSIS — K219 Gastro-esophageal reflux disease without esophagitis: Secondary | ICD-10-CM | POA: Diagnosis not present

## 2018-03-05 DIAGNOSIS — G309 Alzheimer's disease, unspecified: Secondary | ICD-10-CM | POA: Diagnosis not present

## 2018-03-06 DIAGNOSIS — G309 Alzheimer's disease, unspecified: Secondary | ICD-10-CM | POA: Diagnosis not present

## 2018-03-07 DIAGNOSIS — G309 Alzheimer's disease, unspecified: Secondary | ICD-10-CM | POA: Diagnosis not present

## 2018-03-09 DIAGNOSIS — G309 Alzheimer's disease, unspecified: Secondary | ICD-10-CM | POA: Diagnosis not present

## 2018-03-10 DIAGNOSIS — G309 Alzheimer's disease, unspecified: Secondary | ICD-10-CM | POA: Diagnosis not present

## 2018-03-11 DIAGNOSIS — G309 Alzheimer's disease, unspecified: Secondary | ICD-10-CM | POA: Diagnosis not present

## 2018-03-12 DIAGNOSIS — G309 Alzheimer's disease, unspecified: Secondary | ICD-10-CM | POA: Diagnosis not present

## 2018-03-13 DIAGNOSIS — G309 Alzheimer's disease, unspecified: Secondary | ICD-10-CM | POA: Diagnosis not present

## 2018-03-14 DIAGNOSIS — G309 Alzheimer's disease, unspecified: Secondary | ICD-10-CM | POA: Diagnosis not present

## 2018-03-15 DIAGNOSIS — G309 Alzheimer's disease, unspecified: Secondary | ICD-10-CM | POA: Diagnosis not present

## 2018-03-16 DIAGNOSIS — G309 Alzheimer's disease, unspecified: Secondary | ICD-10-CM | POA: Diagnosis not present

## 2018-03-17 DIAGNOSIS — G309 Alzheimer's disease, unspecified: Secondary | ICD-10-CM | POA: Diagnosis not present

## 2018-03-18 DIAGNOSIS — G309 Alzheimer's disease, unspecified: Secondary | ICD-10-CM | POA: Diagnosis not present

## 2018-03-19 DIAGNOSIS — G309 Alzheimer's disease, unspecified: Secondary | ICD-10-CM | POA: Diagnosis not present

## 2018-03-20 DIAGNOSIS — G309 Alzheimer's disease, unspecified: Secondary | ICD-10-CM | POA: Diagnosis not present

## 2018-03-21 DIAGNOSIS — G309 Alzheimer's disease, unspecified: Secondary | ICD-10-CM | POA: Diagnosis not present

## 2018-03-22 DIAGNOSIS — G309 Alzheimer's disease, unspecified: Secondary | ICD-10-CM | POA: Diagnosis not present

## 2018-03-23 DIAGNOSIS — G309 Alzheimer's disease, unspecified: Secondary | ICD-10-CM | POA: Diagnosis not present

## 2018-03-23 DIAGNOSIS — J069 Acute upper respiratory infection, unspecified: Secondary | ICD-10-CM | POA: Diagnosis not present

## 2018-03-24 DIAGNOSIS — G309 Alzheimer's disease, unspecified: Secondary | ICD-10-CM | POA: Diagnosis not present

## 2018-03-25 DIAGNOSIS — G309 Alzheimer's disease, unspecified: Secondary | ICD-10-CM | POA: Diagnosis not present

## 2018-03-26 DIAGNOSIS — G309 Alzheimer's disease, unspecified: Secondary | ICD-10-CM | POA: Diagnosis not present

## 2018-03-27 DIAGNOSIS — G309 Alzheimer's disease, unspecified: Secondary | ICD-10-CM | POA: Diagnosis not present

## 2018-03-27 DIAGNOSIS — R6889 Other general symptoms and signs: Secondary | ICD-10-CM | POA: Diagnosis not present

## 2018-03-27 DIAGNOSIS — Z20828 Contact with and (suspected) exposure to other viral communicable diseases: Secondary | ICD-10-CM | POA: Diagnosis not present

## 2018-03-27 DIAGNOSIS — R05 Cough: Secondary | ICD-10-CM | POA: Diagnosis not present

## 2018-03-28 DIAGNOSIS — G309 Alzheimer's disease, unspecified: Secondary | ICD-10-CM | POA: Diagnosis not present

## 2018-03-30 ENCOUNTER — Encounter: Payer: Self-pay | Admitting: Emergency Medicine

## 2018-03-30 ENCOUNTER — Emergency Department: Payer: Medicare HMO

## 2018-03-30 ENCOUNTER — Other Ambulatory Visit: Payer: Self-pay

## 2018-03-30 ENCOUNTER — Emergency Department
Admission: EM | Admit: 2018-03-30 | Discharge: 2018-03-30 | Disposition: A | Discharge status: Home / Self Care | Payer: Medicare HMO | Attending: Student in an Organized Health Care Education/Training Program | Admitting: Student in an Organized Health Care Education/Training Program

## 2018-03-30 DIAGNOSIS — N3000 Acute cystitis without hematuria: Secondary | ICD-10-CM | POA: Diagnosis not present

## 2018-03-30 DIAGNOSIS — Z87891 Personal history of nicotine dependence: Secondary | ICD-10-CM | POA: Diagnosis not present

## 2018-03-30 DIAGNOSIS — R05 Cough: Secondary | ICD-10-CM

## 2018-03-30 DIAGNOSIS — G309 Alzheimer's disease, unspecified: Secondary | ICD-10-CM | POA: Diagnosis not present

## 2018-03-30 DIAGNOSIS — R059 Cough, unspecified: Secondary | ICD-10-CM

## 2018-03-30 LAB — URINALYSIS, COMPLETE (UACMP) WITH MICROSCOPIC
Bilirubin Urine: NEGATIVE
Glucose, UA: NEGATIVE mg/dL
Ketones, ur: NEGATIVE mg/dL
Nitrite: NEGATIVE
Protein, ur: NEGATIVE mg/dL
Specific Gravity, Urine: 1.011 (ref 1.005–1.030)
Squamous Epithelial / HPF: NONE SEEN (ref 0–5)
WBC, UA: >50 WBC/hpf — ABNORMAL HIGH (ref 0–5)
pH: 6 (ref 5.0–8.0)

## 2018-03-30 MED ORDER — CEPHALEXIN 500 MG PO CAPS: 500.0000 mg | ORAL_CAPSULE | Freq: Two times a day (BID) | ORAL | 0 refills | Status: AC

## 2018-03-30 MED ORDER — POTASSIUM CHLORIDE 10 MEQ/100ML IV SOLN
10.0000 meq | Freq: Once | INTRAVENOUS | Status: DC
  Filled 2018-03-30: qty 100

## 2018-03-30 MED ORDER — GUAIFENESIN 100 MG/5ML PO SOLN: 5.0000 mL | ORAL | 0 refills | Status: DC | PRN

## 2018-03-30 NOTE — ED Notes (Signed)
Patient taken to xray.

## 2018-03-30 NOTE — ED Provider Notes (Signed)
[facility] Emergency Department Provider Note  ____________________________________________   First MD Initiated Contact with Patient 03/30/18 1203     (approximate)  I have reviewed the triage vital signs and the nursing notes.   HISTORY  Chief Complaint Cough   HPI Jamie Boyd is a 68 y.o. female who presents to the emergency department for treatment and evaluation of cough and strong smelling urine.  Cough started almost 2 weeks ago.  She has been evaluated by her primary care provider as well as urgent care.    Past Medical History:  Diagnosis Date  . Alzheimer disease ([place])   . Anemia   . Dementia ([place])   . Goiter   . Memory deficit   . Urinary tract infection, site not specified     Patient Active Problem List   Diagnosis Date Noted  . Falls frequently 10/02/2017  . CVA (cerebral vascular accident) ([place]) 01/24/2017  . Dehydration 01/20/2017  . Failure to thrive in adult 01/20/2017  . Hypotension 01/20/2017  . Allergic rhinitis 07/12/2016  . Recurrent UTI (urinary tract infection) 07/12/2016  . Agitation 11/23/2015  . Moderate dementia, with behavioral disturbance ([place]) 11/23/2015  . Gastroesophageal reflux disease without esophagitis 09/11/2015  . Iron deficiency anemia due to chronic blood loss 06/02/2015  . GERD without esophagitis 06/01/2015  . Difficulty sleeping 04/27/2015  . Moderate dementia without behavioral disturbance ([place]) 03/01/2015  . Acute UTI 01/29/2015  . Anemia 01/29/2015  . Memory deficit 01/29/2015    Past Surgical History:  Procedure Laterality Date  . COLONOSCOPY WITH PROPOFOL N/A 02/18/2015   Procedure: COLONOSCOPY WITH PROPOFOL;  Surgeon: Hulen Luster, MD;  Location: [facility] ENDOSCOPY;  Service: Gastroenterology;  Laterality: N/A;  . ESOPHAGOGASTRODUODENOSCOPY N/A 02/18/2015   Procedure: ESOPHAGOGASTRODUODENOSCOPY (EGD);  Surgeon: Hulen Luster, MD;  Location: [facility] ENDOSCOPY;  Service: Gastroenterology;  Laterality:  N/A;    Prior to Admission medications   Medication Sig Start Date End Date Taking? Authorizing Provider  acetaminophen (TYLENOL) 500 MG tablet Take 2 tablets (1,000 mg total) by mouth every 6 (six) hours as needed. 07/25/17 07/25/18  Darel Hong, MD  aspirin EC 81 MG tablet Take 81 mg by mouth daily.    [provider]  atorvastatin (LIPITOR) 40 MG tablet Take 40 mg by mouth daily at 6 PM.    [provider]  Azelastine HCl 0.15 % SOLN Place 1 spray into the nose daily as needed (allergies).    [provider]  Azelastine HCl 137 MCG/SPRAY SOLN Place 1 spray into both nostrils 2 (two) times daily. Use in each nostril as directed     [provider]  cephALEXin (KEFLEX) 500 MG capsule Take 1 capsule (500 mg total) by mouth 2 (two) times daily for 7 days. 03/30/18 04/06/18  Nadeem Romanoski, Johnette Abraham B, FNP  diclofenac (VOLTAREN) 50 MG EC tablet Take 50 mg by mouth daily.    [provider]  divalproex (DEPAKOTE) 250 MG DR tablet Take 250 mg by mouth 3 (three) times daily.    [provider]  donepezil (ARICEPT) 10 MG tablet Take 10 mg by mouth at bedtime.    [provider]  doxycycline (VIBRA-TABS) 100 MG tablet Take 100 mg by mouth 2 (two) times daily. 04/14/17   [provider]  esomeprazole (NEXIUM) 40 MG capsule Take 40 mg by mouth daily as needed (acid reflux).    [provider]  ferrous sulfate 325 (65 FE) MG EC tablet Take 325 mg by mouth  daily with breakfast.    [provider]  fexofenadine (ALLEGRA) 180 MG tablet Take 180 mg by mouth daily.    [provider]  glucosamine-chondroitin 500-400 MG tablet Take 1 tablet by mouth 3 (three) times daily.    [provider]  guaiFENesin (ROBITUSSIN) 100 MG/5ML SOLN Take 5 mLs (100 mg total) by mouth every 4 (four) hours as needed for cough or to loosen phlegm. 03/30/18   Shetara Launer, Johnette Abraham B, FNP  memantine (NAMENDA) 10 MG tablet Take 10 mg by mouth 2  (two) times daily.  11/23/15 11/27/17  [provider]  mirabegron ER (MYRBETRIQ) 50 MG TB24 tablet Take 1 tablet (50 mg total) by mouth daily. 03/19/17   Zara Council A, PA-C  montelukast (SINGULAIR) 10 MG tablet Take 10 mg by mouth at bedtime.    [provider]  Multiple Vitamins-Minerals (CENTRUM SILVER) tablet Take 1 tablet by mouth daily.    [provider]  nicotine polacrilex (COMMIT) 4 MG lozenge Take 4 mg by mouth as needed for smoking cessation.    [provider]  omega-3 acid ethyl esters (LOVAZA) 1 g capsule Take 1 g by mouth 2 (two) times daily.    [provider]  phenazopyridine (PYRIDIUM) 200 MG tablet Take 200 mg by mouth 3 (three) times daily with meals.    [provider]  QUEtiapine (SEROQUEL) 100 MG tablet Take 100 mg by mouth at bedtime.    [provider]  traZODone (DESYREL) 100 MG tablet Take 100 mg by mouth at bedtime.    [provider]  vitamin E 400 UNIT capsule Take 400 Units by mouth daily.    [provider]    Allergies Pollen extract  Family History  Problem Relation Age of Onset  . Diabetes Maternal Uncle   . Prostate cancer Neg Hx   . Bladder Cancer Neg Hx   . Kidney cancer Neg Hx     Social History Social History   Tobacco Use  . Smoking status: Former Smoker    Types: Cigarettes    Last attempt to quit: 01/25/2014    Years since quitting: 4.1  . Smokeless tobacco: Never Used  Substance Use Topics  . Alcohol use: No  . Drug use: No    Review of Systems: Limited due to dementia  Respiratory: Negative for shortness of breath. Positive for cough. ____________________________________________   PHYSICAL EXAM:  VITAL SIGNS: ED Triage Vitals  Enc Vitals Group     BP 03/30/18 1137 130/66     Pulse Rate 03/30/18 1137 73     Resp 03/30/18 1137 20     Temp 03/30/18 1137 98.5 F (36.9 C)     Temp Source 03/30/18 1137 Oral     SpO2 03/30/18 1137 100 %      Weight 03/30/18 1145 180 lb (81.6 kg)     Height 03/30/18 1145 5\' 2"  (1.575 m)     Head Circumference --      Peak Flow --      Pain Score --      Pain Loc --      Pain Edu? --      Excl. in [place]? --     Constitutional: Well appearing and in no acute distress. Eyes: Conjunctivae are normal. Head: Atraumatic. Nose: No congestion/rhinnorhea. Mouth/Throat: Mucous membranes are moist.  Oropharynx non-erythematous. Neck: No stridor.   Cardiovascular: Normal rate, regular rhythm. Grossly normal heart sounds.  Good peripheral circulation. Respiratory: Normal respiratory effort.  No retractions. Lungs CTAB. Gastrointestinal: Soft and nontender. No distention. No abdominal bruits. Musculoskeletal: No lower extremity tenderness nor edema.  No joint effusions. Neurologic:  Normal speech and language.  Skin:  Skin is warm, dry and intact. No rash noted. Psychiatric: Mood and affect are baseline per caregiver. Speech and behavior are normal per caregiver.  ____________________________________________   LABS (all labs ordered are listed, but only abnormal results are displayed)  Labs Reviewed  URINALYSIS, COMPLETE (UACMP) WITH MICROSCOPIC - Abnormal; Notable for the following components:      Result Value   Color, Urine YELLOW (*)    APPearance HAZY (*)    Hgb urine dipstick SMALL (*)    Leukocytes,Ua LARGE (*)    WBC, UA >50 (*)    Bacteria, UA RARE (*)    All other components within normal limits   ____________________________________________  EKG  Not indicated. ____________________________________________  RADIOLOGY  ED MD interpretation: Basilar chest densities most compatible with atelectasis per radiology.  Official radiology report(s): Dg Chest 2 View  Result Date: 03/30/2018 CLINICAL DATA:  Cough and sinus congestion. EXAM: CHEST - 2 VIEW COMPARISON:  04/15/2017 FINDINGS: Basilar chest densities, best seen on the lateral view. Upper lungs are clear. Heart and  mediastinum are within normal limits. Trachea is midline. Degenerative changes at the right shoulder. No large pleural effusions. Mild degenerative changes in thoracic spine. IMPRESSION: Basilar chest densities. Findings are most compatible with atelectasis. Electronically Signed   By: Markus Daft M.D.   On: 03/30/2018 12:52    ____________________________________________   PROCEDURES  Procedure(s) performed: None  Procedures  Critical Care performed: No  ____________________________________________   INITIAL IMPRESSION / ASSESSMENT AND PLAN / ED COURSE  As part of my medical decision making, I reviewed the following data within the electronic MEDICAL RECORD NUMBER     68 year old female with advanced dementia comes in today with her caregiver for evaluation of cough and malodorous urine.  Chest x-ray is reassuring.  Urinalysis does show concern for acute cystitis.  Caregiver states that she has been evaluated by both primary care and urgent care.  She was prescribed Tessalon Perles, but she is unable to understand to swallow and not to them.  Today, she will be given a prescription for guaifenesin suspension and Keflex for her acute cystitis.  Caregiver was advised that she needs to follow-up with the primary caregiver if not improving over the next few days.  She was encouraged to have a repeat urinalysis in 10 to 14 days to make sure that the infection is cleared.  She was advised to return with her to the emergency department for symptoms of change or worsen if unable to schedule an appointment with primary care.      ____________________________________________   FINAL CLINICAL IMPRESSION(S) / ED DIAGNOSES  Final diagnoses:  Cough  Acute cystitis without hematuria     ED Discharge Orders         Ordered    cephALEXin (KEFLEX) 500 MG capsule  2 times daily     03/30/18 1338    guaiFENesin (ROBITUSSIN) 100 MG/5ML SOLN  Every 4 hours PRN     03/30/18 1338            Note:  This document was prepared using Dragon voice recognition software and may include unintentional dictation errors.    Victorino Dike, FNP 03/30/18 1610    Merlyn Lot, MD 04/01/18 1501

## 2018-03-30 NOTE — ED Triage Notes (Signed)
Pt to ER with c/o cough, congestion and fever for over a week.  Pt seen at [facility] and her PCP in last week, but pt continues to get worse.  Also states urine has strong odor.  Pt has dementia and caregiver provided information.

## 2018-03-30 NOTE — ED Triage Notes (Signed)
First Nurse Note:  C/O cough, sinus congestion x 1 week.  Also intermittent fever.  Has been seen at [facility] and by PCP, started on Tamiflu.  Patient's daughter in law is concerned that no CXR has been done.   Patient is Awake and alert to baseline.  Has history of dementia.  Respirations regular and non labored NAD

## 2018-03-30 NOTE — Discharge Instructions (Signed)
Follow-up with primary care for symptoms that are not improving over the next few days.  Return to the emergency department for symptoms of change or worsen if you are unable to schedule an appointment.

## 2018-03-30 NOTE — ED Notes (Signed)
Potassium chloride order placed in error.

## 2018-03-30 NOTE — ED Notes (Signed)
Marla Roe tech obtained urine specimen via In and out cath with this Probation officer as a 2nd.

## 2018-03-31 DIAGNOSIS — G309 Alzheimer's disease, unspecified: Secondary | ICD-10-CM | POA: Diagnosis not present

## 2018-04-01 DIAGNOSIS — N3941 Urge incontinence: Secondary | ICD-10-CM | POA: Diagnosis not present

## 2018-04-01 DIAGNOSIS — G309 Alzheimer's disease, unspecified: Secondary | ICD-10-CM | POA: Diagnosis not present

## 2018-04-02 DIAGNOSIS — G309 Alzheimer's disease, unspecified: Secondary | ICD-10-CM | POA: Diagnosis not present

## 2018-04-03 DIAGNOSIS — G309 Alzheimer's disease, unspecified: Secondary | ICD-10-CM | POA: Diagnosis not present

## 2018-04-04 DIAGNOSIS — G309 Alzheimer's disease, unspecified: Secondary | ICD-10-CM | POA: Diagnosis not present

## 2018-04-05 DIAGNOSIS — G309 Alzheimer's disease, unspecified: Secondary | ICD-10-CM | POA: Diagnosis not present

## 2018-04-06 DIAGNOSIS — G309 Alzheimer's disease, unspecified: Secondary | ICD-10-CM | POA: Diagnosis not present

## 2018-04-07 DIAGNOSIS — G309 Alzheimer's disease, unspecified: Secondary | ICD-10-CM | POA: Diagnosis not present

## 2018-04-08 DIAGNOSIS — G309 Alzheimer's disease, unspecified: Secondary | ICD-10-CM | POA: Diagnosis not present

## 2018-04-09 DIAGNOSIS — G309 Alzheimer's disease, unspecified: Secondary | ICD-10-CM | POA: Diagnosis not present

## 2018-04-10 DIAGNOSIS — G309 Alzheimer's disease, unspecified: Secondary | ICD-10-CM | POA: Diagnosis not present

## 2018-04-11 DIAGNOSIS — G309 Alzheimer's disease, unspecified: Secondary | ICD-10-CM | POA: Diagnosis not present

## 2018-04-12 DIAGNOSIS — G309 Alzheimer's disease, unspecified: Secondary | ICD-10-CM | POA: Diagnosis not present

## 2018-04-13 DIAGNOSIS — G309 Alzheimer's disease, unspecified: Secondary | ICD-10-CM | POA: Diagnosis not present

## 2018-04-14 DIAGNOSIS — G309 Alzheimer's disease, unspecified: Secondary | ICD-10-CM | POA: Diagnosis not present

## 2018-04-15 DIAGNOSIS — G309 Alzheimer's disease, unspecified: Secondary | ICD-10-CM | POA: Diagnosis not present

## 2018-04-17 DIAGNOSIS — G309 Alzheimer's disease, unspecified: Secondary | ICD-10-CM | POA: Diagnosis not present

## 2018-04-18 DIAGNOSIS — G309 Alzheimer's disease, unspecified: Secondary | ICD-10-CM | POA: Diagnosis not present

## 2018-04-19 DIAGNOSIS — G309 Alzheimer's disease, unspecified: Secondary | ICD-10-CM | POA: Diagnosis not present

## 2018-04-20 DIAGNOSIS — G309 Alzheimer's disease, unspecified: Secondary | ICD-10-CM | POA: Diagnosis not present

## 2018-04-21 DIAGNOSIS — G309 Alzheimer's disease, unspecified: Secondary | ICD-10-CM | POA: Diagnosis not present

## 2018-04-22 DIAGNOSIS — G309 Alzheimer's disease, unspecified: Secondary | ICD-10-CM | POA: Diagnosis not present

## 2018-04-23 DIAGNOSIS — G309 Alzheimer's disease, unspecified: Secondary | ICD-10-CM | POA: Diagnosis not present

## 2018-04-24 DIAGNOSIS — G309 Alzheimer's disease, unspecified: Secondary | ICD-10-CM | POA: Diagnosis not present

## 2018-04-25 DIAGNOSIS — G309 Alzheimer's disease, unspecified: Secondary | ICD-10-CM | POA: Diagnosis not present

## 2018-04-26 DIAGNOSIS — G309 Alzheimer's disease, unspecified: Secondary | ICD-10-CM | POA: Diagnosis not present

## 2018-04-27 DIAGNOSIS — G309 Alzheimer's disease, unspecified: Secondary | ICD-10-CM | POA: Diagnosis not present

## 2018-04-28 DIAGNOSIS — G309 Alzheimer's disease, unspecified: Secondary | ICD-10-CM | POA: Diagnosis not present

## 2018-06-24 ENCOUNTER — Encounter: Payer: Self-pay | Admitting: Nurse Practitioner

## 2018-06-24 ENCOUNTER — Other Ambulatory Visit: Payer: Medicare HMO | Admitting: Nurse Practitioner

## 2018-06-24 DIAGNOSIS — R413 Other amnesia: Secondary | ICD-10-CM

## 2018-06-24 DIAGNOSIS — Z515 Encounter for palliative care: Secondary | ICD-10-CM | POA: Insufficient documentation

## 2018-06-24 NOTE — Progress Notes (Signed)
[facility] Palliative Care Consult Note Telephone: [phone]  Fax: [phone]  PATIENT NAME: Jamie Boyd DOB: [birth year] MRN: [id number]  PRIMARY CARE PROVIDER:   Sharyne Peach, MD  REFERRING PROVIDER:  Sharyne Peach, MD [address], [place] [address]  RESPONSIBLE PARTY:   Shaneya Taketa niece [phone]  Due to the COVID-19 crisis, this visit was done via telemedicine/telephone as video was not available per [name] from my office and it was initiated and consent by this patient and or family.  RECOMMENDATIONS and PLAN:  1. Palliative care encounter Z51.5; Palliative medicine team will continue to support patient, patient's family, and medical team. Visit consisted of counseling and education dealing with the complex and emotionally intense issues of symptom management and palliative care in the setting of serious and potentially life-threatening illness  2. Memory loss R41.3 secondary to dementia. Continue with supportive measures as chronic disease remains Progressive.  ASSESSMENT:    I called [name], Jamie. Cotroneo's niece. Verbal consent received. We talked about purpose of palliative care visit. Jamie Boyd was living in [place] until about three years ago when her son notified her niece sharing that she was not acting herself. She came to [place] to visit and did not return to [place]. She currently resides with her niece who is also her healthcare power of attorney. She is a do not resuscitate. Three years ago she was walking and pacing and now she is able to stand and take slow small steps. She does required to be dressed and toileted as she is incontinent. She does feed herself though requires a lot of prompting and encouraging with good appetite. She is cognitively impaired. She is limited verbally. She is able to remember names after she is prompted. [name] endorses that they are looking for a facility placement for her and memory  care as it's getting more difficult to care for her at home. We talked about role of palliative care and plan of care. We talked about palliative care social worker and [name] asked for console to help with planning for placement. We also talked about most form and hard Choice book. [name] wishes to review most form and her choice book prior to completing and will mail to her. Appointment set up for next palliative care follow up visit in 4 weeks to complete most form and reevaluate progression of dementia. Therapeutic listening and emotional support provided. Questions answered satisfaction. Contact information provided.  03/04/2018 weight 161 lb 2 / 22 / 2020 weight 180 lb  I spent 45 minutes providing this consultation,  from 3:30pm to 4:15pm. More than 50% of the time in this consultation was spent coordinating communication.   HISTORY OF PRESENT ILLNESS:  Jamie Boyd is a 68 y.o. year old female with multiple medical problems including Late onset CVA, subdural hematoma 8 / 5 / 2019, Alzheimer's dementia, iron deficiency anemia, GERD, goiter, allergies,  hypercholesterolemia, history of urinary tract infection, hysterectomy. Jamie Boyd was living in [place] until about three years ago when her son notified her niece sharing that she was not acting herself. She came to [place] to visit and did not return to [place]. She currently resides with her niece who is also her healthcare power of attorney.1 / 53 / 2020 Cardiology visit for follow-up visit dementia with no behavioral problems followed by outpatient neurology. UTI by history recommended amoxicillin therapy to help with infection. Omeprazole for GERD. Continued on Aricept for memory issues. Continued on  iron therapy for anemia. History of frequent Falls intermittently. On Lipitor for lipid management for hyperlipidemia. Currently takes HCTZ for hypertension. Follow up in six months. Primary provider visit 2/ 19 / 2020 for flu-like symptoms  given Tamiflu with influenza A and B negative. Patient is nonverbal, not able to communicate. She was given Ladona Ridgel for cough in addition to Tamiflu. She was seen in the emergency department 2 / 22 / 2020 for strong smelling urine and cough for 2 weeks. Workout significant for acute cystitis and given prescription for Keflex and guaifenesin for cough.  Palliative Care was asked to help to continue to address goals of care.   CODE STATUS: DNR  PPS: 30% HOSPICE ELIGIBILITY/DIAGNOSIS: TBD  PAST MEDICAL HISTORY:  Past Medical History:  Diagnosis Date   Alzheimer disease ([place])    Anemia    Dementia ([place])    Goiter    Memory deficit    Urinary tract infection, site not specified     SOCIAL HX:  Social History   Tobacco Use   Smoking status: Former Smoker    Types: Cigarettes    Last attempt to quit: 01/25/2014    Years since quitting: 4.4   Smokeless tobacco: Never Used  Substance Use Topics   Alcohol use: No    ALLERGIES:  Allergies  Allergen Reactions   Pollen Extract Other (See Comments)     PERTINENT MEDICATIONS:  Outpatient Encounter Medications as of 06/24/2018  Medication Sig   acetaminophen (TYLENOL) 500 MG tablet Take 2 tablets (1,000 mg total) by mouth every 6 (six) hours as needed.   aspirin EC 81 MG tablet Take 81 mg by mouth daily.   atorvastatin (LIPITOR) 40 MG tablet Take 40 mg by mouth daily at 6 PM.   Azelastine HCl 0.15 % SOLN Place 1 spray into the nose daily as needed (allergies).   Azelastine HCl 137 MCG/SPRAY SOLN Place 1 spray into both nostrils 2 (two) times daily. Use in each nostril as directed    diclofenac (VOLTAREN) 50 MG EC tablet Take 50 mg by mouth daily.   divalproex (DEPAKOTE) 250 MG DR tablet Take 250 mg by mouth 3 (three) times daily.   donepezil (ARICEPT) 10 MG tablet Take 10 mg by mouth at bedtime.   doxycycline (VIBRA-TABS) 100 MG tablet Take 100 mg by mouth 2 (two) times daily.   esomeprazole (NEXIUM) 40 MG  capsule Take 40 mg by mouth daily as needed (acid reflux).   ferrous sulfate 325 (65 FE) MG EC tablet Take 325 mg by mouth daily with breakfast.   fexofenadine (ALLEGRA) 180 MG tablet Take 180 mg by mouth daily.   glucosamine-chondroitin 500-400 MG tablet Take 1 tablet by mouth 3 (three) times daily.   guaiFENesin (ROBITUSSIN) 100 MG/5ML SOLN Take 5 mLs (100 mg total) by mouth every 4 (four) hours as needed for cough or to loosen phlegm.   memantine (NAMENDA) 10 MG tablet Take 10 mg by mouth 2 (two) times daily.    mirabegron ER (MYRBETRIQ) 50 MG TB24 tablet Take 1 tablet (50 mg total) by mouth daily.   montelukast (SINGULAIR) 10 MG tablet Take 10 mg by mouth at bedtime.   Multiple Vitamins-Minerals (CENTRUM SILVER) tablet Take 1 tablet by mouth daily.   nicotine polacrilex (COMMIT) 4 MG lozenge Take 4 mg by mouth as needed for smoking cessation.   omega-3 acid ethyl esters (LOVAZA) 1 g capsule Take 1 g by mouth 2 (two) times daily.   phenazopyridine (PYRIDIUM) 200 MG  tablet Take 200 mg by mouth 3 (three) times daily with meals.   QUEtiapine (SEROQUEL) 100 MG tablet Take 100 mg by mouth at bedtime.   traZODone (DESYREL) 100 MG tablet Take 100 mg by mouth at bedtime.   vitamin E 400 UNIT capsule Take 400 Units by mouth daily.   No facility-administered encounter medications on file as of 06/24/2018.     PHYSICAL EXAM:   Deferred  Suellyn Meenan Z Sari Cogan, NP

## 2018-06-25 ENCOUNTER — Other Ambulatory Visit: Payer: Self-pay

## 2018-06-27 ENCOUNTER — Telehealth: Payer: Self-pay

## 2018-06-27 NOTE — Telephone Encounter (Signed)
SW received referral from Gerald, NP to assist with resource planning and discuss care needs. SW attempted to contact [place], patient's niece. SW left VM with contact information for follow-up.

## 2018-07-02 ENCOUNTER — Telehealth: Payer: Self-pay

## 2018-07-02 NOTE — Telephone Encounter (Signed)
SW contacted [name], patient's niece to assist with resource planning and discuss care needs. Patient currently lives with her brother and sister-in-law. [name] said they moved patient to [place] four years ago after concerns for the care that her son was providing. [name] reports that she goes over daily to help with care, but patient is declining. [name] said that it is becoming increasingly difficult to manage patient's care at home. Patient's brother, patient's sister-in-law and [name] are all HCPOA. Per [name], patient has Medicaid and has PCS services in place. Patient is also receiving home health at this time with [facility]. [name] said that the family agrees that they would like to consider LTC placement but are unsure of their options. At [facility] request, SW to provide list and follow-up with her later this week.

## 2018-07-08 ENCOUNTER — Telehealth: Payer: Self-pay

## 2018-07-08 NOTE — Telephone Encounter (Signed)
SW emailed list of family care homes/facilities to Juliette, patient's niece and addressed questions. [name] plans to talk with family and contact family care homes/facilities to discuss options. SW provided contact information in case additional needs arise.

## 2018-08-01 ENCOUNTER — Encounter: Payer: Self-pay | Admitting: Nurse Practitioner

## 2018-08-01 ENCOUNTER — Other Ambulatory Visit: Payer: Medicare HMO | Admitting: Nurse Practitioner

## 2018-08-01 ENCOUNTER — Other Ambulatory Visit: Payer: Self-pay

## 2018-08-01 DIAGNOSIS — Z515 Encounter for palliative care: Secondary | ICD-10-CM

## 2018-08-01 DIAGNOSIS — R413 Other amnesia: Secondary | ICD-10-CM | POA: Insufficient documentation

## 2018-08-01 NOTE — Progress Notes (Signed)
[facility] Pall[facility] Consult Note Telephone: [phone]  Fax: [phone]  PATIENT NAME: Jamie Boyd DOB: [birth year] MRN: [id number]  PRIMARY CARE PROVIDER:   Sharyne Peach, MD  REFERRING PROVIDER:  Sharyne Peach, MD [address],  [place] [address]  RESPONSIBLE PARTY:    Hadiya Spoerl niece [phone]  Due to the COVID-19 crisis, this visit was done via telemedicine/telephone as video was not available per Jamie Boyd from my office and it was initiated and consent by this patient and or family.  RECOMMENDATIONS and PLAN:  1. Palliative care encounter Z51.5; Palliative medicine team will continue to support patient, patient's family, and medical team. Visit consisted of counseling and education dealing with the complex and emotionally intense issues of symptom management and palliative care in the setting of serious and potentially life-threatening illness  2. Memory loss R41.3 secondary to dementia. Continue with supportive measures as chronic disease remains Progressive.  ASSESSMENT:     Since last palliative care telemedicine visit Jamie Kidney, Jamie Boyd, niece received list of long-term care facilities from palliative care social worker. Family was going to review and further discuss possible placement. I called Jamie Boyd, Jamie Boyd power of attorney. We talked about purpose of palliative care follow-up visit and Jamie Boyd in agreement. We talked about how Jamie Boyd is doing. Jamie Boyd endorse that she overall seems to be declining in the setting of dementia. She is actually now spitting on the floor. We talked about redirection and behaviors. Jamie Boyd endorses that she becomes very stubborn at times and it is difficult to redirect her. We talked about her appetite being good. We talked about during eating or drinking she does not appear to be having any trouble with aspiration, choking or coughing. She does seem to be pocketing food at  times though as family has to remove food from her mouth. We talked about her functional level overall. She is now taking smaller steps together and it's taking longer for her to take steps. Jamie Boyd endorses it's almost like She's forgetting how to walk. No recent Falls. We talked about chronic disease progression of dementia. We talked about realistic expectations as advances. We talked about Jamie Boyd's concerns with increased skill level of care. We talked about last palliative care discussion concerning transition to long-term care facility. We talked about social worker from palliative care is discussion and emailed a list of facilities. Jamie Boyd endorses she did not receive the list and we'll have Jamie Boyd worker for palliative care resend information. Jamie Boyd endorses that they are hoping to eventually transition her to long-term care but at present time they are taking one day at a time. Jamie Boyd talked about her own health concerns and problems that she is experiencing. We talked about caregiver fatigue and self-care, coping strategies. We talked about medical goals of care with Focus to treat what is treatable. We talked about most form and hard Choice book. Jamie Boyd endorses that she has been reading the hard Choice book but she is not ready at this point in time to complete it. We did schedule a follow-up palliative care is it for July 6th at 1, Monday for further discussion of goals of care and completion of most form. Jamie Boyd endorses she will have Jamie. Pflieger's brother there as well who is also shared health care power-of-attorney. We talked about role of palliative care and plan of care. Therapeutic listening and emotional support provided. At present time Jamie. Yearsley continues a slow  Progressive decline in the setting of dementia though not quite at the point of hospice eligibility due to good appetite, continues to ambulate. She has gained a significant amount of weight since leaving [place] and being relocated  to [place]. Questions answered to satisfaction. Contact information provided. Jamie Boyd endorses she was thankful for palliative care follow-up visit.  I spent 42 minutes providing this consultation,  from 10:00am to 10:42am. More than 50% of the time in this consultation was spent coordinating communication.   HISTORY OF PRESENT ILLNESS:  [name] is a 68 y.o. year old female with multiple medical problems including Late onset CVA, subdural hematoma 8 / 5 / 2019, Alzheimer's dementia, iron deficiency anemia, GERD, goiter, allergies, hypercholesterolemia, history of urinary tract infection, hysterectomy. No recent falls, wounds, hospitalizations, infections. She continues to live at home with brother, family. Palliative Care was asked to help to continue to address goals of care.   CODE STATUS: DNR  PPS: 30% HOSPICE ELIGIBILITY/DIAGNOSIS: TBD  PAST MEDICAL HISTORY:  Past Medical History:  Diagnosis Date   Alzheimer disease ([place])    Anemia    Dementia ([place])    Goiter    Memory deficit    Urinary tract infection, site not specified     SOCIAL HX:  Social History   Tobacco Use   Smoking status: Former Smoker    Types: Cigarettes    Quit date: 01/25/2014    Years since quitting: 4.5   Smokeless tobacco: Never Used  Substance Use Topics   Alcohol use: No    ALLERGIES:  Allergies  Allergen Reactions   Pollen Extract Other (See Comments)     PERTINENT MEDICATIONS:  Outpatient Encounter Medications as of 08/01/2018  Medication Sig   aspirin EC 81 MG tablet Take 81 mg by mouth daily.   atorvastatin (LIPITOR) 40 MG tablet Take 40 mg by mouth daily at 6 PM.   Azelastine HCl 0.15 % SOLN Place 1 spray into the nose daily as needed (allergies).   Azelastine HCl 137 MCG/SPRAY SOLN Place 1 spray into both nostrils 2 (two) times daily. Use in each nostril as directed    diclofenac (VOLTAREN) 50 MG EC tablet Take 50 mg by mouth daily.   divalproex (DEPAKOTE) 250 MG  DR tablet Take 250 mg by mouth 3 (three) times daily.   donepezil (ARICEPT) 10 MG tablet Take 10 mg by mouth at bedtime.   doxycycline (VIBRA-TABS) 100 MG tablet Take 100 mg by mouth 2 (two) times daily.   esomeprazole (NEXIUM) 40 MG capsule Take 40 mg by mouth daily as needed (acid reflux).   ferrous sulfate 325 (65 FE) MG EC tablet Take 325 mg by mouth daily with breakfast.   fexofenadine (ALLEGRA) 180 MG tablet Take 180 mg by mouth daily.   glucosamine-chondroitin 500-400 MG tablet Take 1 tablet by mouth 3 (three) times daily.   guaiFENesin (ROBITUSSIN) 100 MG/5ML SOLN Take 5 mLs (100 mg total) by mouth every 4 (four) hours as needed for cough or to loosen phlegm.   memantine (NAMENDA) 10 MG tablet Take 10 mg by mouth 2 (two) times daily.    mirabegron ER (MYRBETRIQ) 50 MG TB24 tablet Take 1 tablet (50 mg total) by mouth daily.   montelukast (SINGULAIR) 10 MG tablet Take 10 mg by mouth at bedtime.   Multiple Vitamins-Minerals (CENTRUM SILVER) tablet Take 1 tablet by mouth daily.   nicotine polacrilex (COMMIT) 4 MG lozenge Take 4 mg by mouth as needed for smoking cessation.  omega-3 acid ethyl esters (LOVAZA) 1 g capsule Take 1 g by mouth 2 (two) times daily.   phenazopyridine (PYRIDIUM) 200 MG tablet Take 200 mg by mouth 3 (three) times daily with meals.   QUEtiapine (SEROQUEL) 100 MG tablet Take 100 mg by mouth at bedtime.   traZODone (DESYREL) 100 MG tablet Take 100 mg by mouth at bedtime.   vitamin E 400 UNIT capsule Take 400 Units by mouth daily.   No facility-administered encounter medications on file as of 08/01/2018.     PHYSICAL EXAM:   Deferred  Latayvia Mandujano Z Raegyn Renda, NP

## 2018-08-12 ENCOUNTER — Other Ambulatory Visit: Payer: Medicare HMO | Admitting: Nurse Practitioner

## 2018-08-12 ENCOUNTER — Other Ambulatory Visit: Payer: Self-pay

## 2018-08-12 ENCOUNTER — Encounter: Payer: Self-pay | Admitting: Nurse Practitioner

## 2018-08-12 DIAGNOSIS — Z515 Encounter for palliative care: Secondary | ICD-10-CM

## 2018-08-12 DIAGNOSIS — R413 Other amnesia: Secondary | ICD-10-CM

## 2018-08-12 NOTE — Progress Notes (Signed)
Jamie Boyd Consult Note Telephone: [phone]  Fax: [phone]  PATIENT NAME: Jamie Boyd DOB: [birth year] MRN: [id number]  PRIMARY CARE PROVIDER:   Sharyne Peach, MD  REFERRING PROVIDER:  Sharyne Peach, MD [address],  [place] [address]  RESPONSIBLE PARTY:   Jamie Boyd niece [phone]  RECOMMENDATIONS and PLAN: 1.Palliative care encounter Z51.5; Palliative medicine team will continue to support patient, patient's family, and medical team. Visit consisted of counseling and education dealing with the complex and emotionally intense issues of symptom management and palliative care in the setting of serious and potentially life-threatening illness  2.Memory loss R41.3 secondary to dementia. Continue with supportive measures as chronic disease remains Progressive.  ASSESSMENT:     Jamie Boyd continues to reside at home with her brother Jamie Boyd and niece Jamie Boyd. Today completed a face-to-face visit follow up palliative care visit for further discussion of goals of care and code status. Jamie Boyd, Jamie Boyd niece and brother Jamie Boyd were present in addition to Jamie Boyd. Jamie Boyd is cognitively impaired but did not her head to questions. No meaningful discussion with cognitive impairment. She was able to say a few simple words such as calling Jamie Boyd by her name. Jamie Boyd continues to walk but it's very slow shuffling gait. No recent Falls. We talked about her appetite which continues to be fair. She does require more assistance than feeding. No weight loss as she continues to gain weight. We talked about behaviors briefly. We talked about family dynamics. We talked about her functional level. We talked about chronic disease progression of Dementia in the setting of natural aging. We talked about further plans and that at one point in time they did try memory care unit at [facility] for placement. They had a challenging experience while  there and then this Jamie Boyd was moved back to their home. At present time the goal is to continue to keep her at home as long as possible. Discuss that if they decide at some point to transition her to a facility we can involve palliative social worker to revisit to help. We talked about medical goals of care including aggressive versus conservative vs comfort care. We talked about code status as she currently is a full code. Jamie Boyd and [facility] endorse that they would not want for her to have CPR and DNR form completed, [place] with leaving two copies and home. We reviewed the most form and also completed. Jamie Boyd endorses that he would not want her to be on a ventilator. His wishes are to treat what is treatable but quality of life is more important. We talked about antibiotic therapy, IV fluids, feeding tube. Most form completed as DNR, limited interventions, antibiotics is indicated, IV fluids as indicated, no feeding tube. Verbal consent for picture taken to Jamie Boyd and will download to Epic in her Boyd chart at Jamie Boyd. Jamie Boyd both in agreement. We talked about role of palliative care and plan of care. We discussed that at present time she does remain stable in the setting of chronic disease of progression of dementia. Follow up palliative care visit scheduled for six weeks if needed or sooner should she declined. Jamie Boyd and Jamie Boyd both in agreement. Therapeutic listening and emotional support provided. Questions answered to satisfaction. Contact information provided.   I spent 60 minutes providing this consultation,  from 1:00pm to 2:00pm. More than 50% of the time in this consultation was spent coordinating communication.   HISTORY  OF PRESENT ILLNESS:  Jamie Boyd is a 68 y.o. year old female with multiple medical problems including Late onset CVA, subdural hematoma 8 / 5 / 2019, Alzheimer's dementia, iron deficiency anemia, GERD, goiter, allergies,hypercholesterolemia, history of urinary  tract infection, hysterectomy. Palliative Care was asked to help to continue to address goals of care.   CODE STATUS: DNR  PPS: 40% HOSPICE ELIGIBILITY/DIAGNOSIS: TBD  PAST MEDICAL HISTORY:  Past Medical History:  Diagnosis Date   Alzheimer disease ([place])    Anemia    Dementia ([place])    Goiter    Memory deficit    Urinary tract infection, site not specified     SOCIAL HX:  Social History   Tobacco Use   Smoking status: Former Smoker    Types: Cigarettes    Quit date: 01/25/2014    Years since quitting: 4.5   Smokeless tobacco: Never Used  Substance Use Topics   Alcohol use: No    ALLERGIES:  Allergies  Allergen Reactions   Pollen Extract Other (See Comments)     PERTINENT MEDICATIONS:  Outpatient Encounter Medications as of 08/12/2018  Medication Sig   aspirin EC 81 MG tablet Take 81 mg by mouth daily.   atorvastatin (LIPITOR) 40 MG tablet Take 40 mg by mouth daily at 6 PM.   Azelastine HCl 0.15 % SOLN Place 1 spray into the nose daily as needed (allergies).   Azelastine HCl 137 MCG/SPRAY SOLN Place 1 spray into both nostrils 2 (two) times daily. Use in each nostril as directed    diclofenac (VOLTAREN) 50 MG EC tablet Take 50 mg by mouth daily.   divalproex (DEPAKOTE) 250 MG DR tablet Take 250 mg by mouth 3 (three) times daily.   donepezil (ARICEPT) 10 MG tablet Take 10 mg by mouth at bedtime.   doxycycline (VIBRA-TABS) 100 MG tablet Take 100 mg by mouth 2 (two) times daily.   esomeprazole (NEXIUM) 40 MG capsule Take 40 mg by mouth daily as needed (acid reflux).   ferrous sulfate 325 (65 FE) MG EC tablet Take 325 mg by mouth daily with breakfast.   fexofenadine (ALLEGRA) 180 MG tablet Take 180 mg by mouth daily.   glucosamine-chondroitin 500-400 MG tablet Take 1 tablet by mouth 3 (three) times daily.   guaiFENesin (ROBITUSSIN) 100 MG/5ML SOLN Take 5 mLs (100 mg total) by mouth every 4 (four) hours as needed for cough or to loosen phlegm.    memantine (NAMENDA) 10 MG tablet Take 10 mg by mouth 2 (two) times daily.    mirabegron ER (MYRBETRIQ) 50 MG TB24 tablet Take 1 tablet (50 mg total) by mouth daily.   montelukast (SINGULAIR) 10 MG tablet Take 10 mg by mouth at bedtime.   Multiple Vitamins-Minerals (CENTRUM SILVER) tablet Take 1 tablet by mouth daily.   nicotine polacrilex (COMMIT) 4 MG lozenge Take 4 mg by mouth as needed for smoking cessation.   omega-3 acid ethyl esters (LOVAZA) 1 g capsule Take 1 g by mouth 2 (two) times daily.   phenazopyridine (PYRIDIUM) 200 MG tablet Take 200 mg by mouth 3 (three) times daily with meals.   QUEtiapine (SEROQUEL) 100 MG tablet Take 100 mg by mouth at bedtime.   traZODone (DESYREL) 100 MG tablet Take 100 mg by mouth at bedtime.   vitamin E 400 UNIT capsule Take 400 Units by mouth daily.   No facility-administered encounter medications on file as of 08/12/2018.     PHYSICAL EXAM:   General: NAD, pleasantly confused female Cardiovascular: regular  rate and rhythm Pulmonary: clear ant fields Abdomen: soft, nontender, + bowel sounds Extremities: mild BLE edema, no joint deformities Skin: no rashes Neurological: Weakness but otherwise nonfocal  Polina Burmaster Ihor Gully, NP

## 2018-09-24 ENCOUNTER — Telehealth: Payer: Self-pay | Admitting: Nurse Practitioner

## 2018-09-24 NOTE — Telephone Encounter (Signed)
I called [name], Ms. Drohan's niece for scheduled palliative care follow-up visit and [name] endorses she herself is currently in the hospital. I called Lattie Haw, [phone] for follow-up scheduled palliative care visit and message left with contact information to return call

## 2018-10-04 ENCOUNTER — Telehealth: Payer: Self-pay | Admitting: Nurse Practitioner

## 2018-10-04 ENCOUNTER — Telehealth: Payer: Self-pay

## 2018-10-04 NOTE — Telephone Encounter (Signed)
Spoke with [name] (CG) and we have scheduled a Telephone Palliative f/u visit for 10/17/18 @ 2 PM.  [name] also had questions about the CAP program, I told her that I would let the NP know and she would get in touch with our SW and asked her to call.  [name] was in agreement with this

## 2018-10-04 NOTE — Telephone Encounter (Signed)
SW received request to contact [name] (patient's niece) to discuss CAP. SW contacted [facility] and she reports that patient is on the waitlist but that they have not heard back. [name] noted that a neighbor was added to waitlist after patient and she has started to receive services. SW provided education on CAP and program criteria. SW agreed to contact Angelica with CAP in [facility] request. SW unable to reach Angelica. SW left VM with contact information.

## 2018-10-17 ENCOUNTER — Other Ambulatory Visit: Payer: Medicare HMO | Admitting: Nurse Practitioner

## 2018-10-17 ENCOUNTER — Encounter: Payer: Self-pay | Admitting: Nurse Practitioner

## 2018-10-17 ENCOUNTER — Telehealth: Payer: Self-pay

## 2018-10-17 ENCOUNTER — Other Ambulatory Visit: Payer: Self-pay

## 2018-10-17 DIAGNOSIS — Z515 Encounter for palliative care: Secondary | ICD-10-CM

## 2018-10-17 NOTE — Telephone Encounter (Signed)
SW contacted [name] (patient's niece) to follow-up on CAP referral. [name] reports they have a phone interview scheduled for September 18th. No additional needs/concerns.

## 2018-10-17 NOTE — Progress Notes (Signed)
[facility] Palliative Care Consult Note Telephone: [phone]  Fax: [phone]  PATIENT NAME: Jamie Boyd DOB: 04-02-1950 MRN: [id number]  PRIMARY CARE PROVIDER:   Sharyne Peach, MD  REFERRING PROVIDER:  Sharyne Peach, MD Jamie Boyd,  Jamie [place] [id number]  RESPONSIBLE PARTY:   Jamie Boyd niece [id number]  Due to the COVID-19 crisis, this visit was done via telemedicine from my office and it was initiated and consent by this patient and or family.  RECOMMENDATIONS and PLAN: 1. ACP: DNR: Medical goals focus on comfort, monitoring for further decline as she continues to functionally but continues with good appetite, once looses 10% body weight will re-visit hospice services  2.Memory loss R41.3 secondary to dementia. Continue with supportive measures as chronic disease remains Progressive.  3. Palliative care encounter Z51.5; Palliative medicine team will continue to support patient, patient's family, and medical team. Visit consisted of counseling and education dealing with the complex and emotionally intense issues of symptom management and palliative care in the setting of serious and potentially life-threatening illness  I spent 40 minutes providing this consultation,  from 2:00pm to 2:45pm. More than 50% of the time in this consultation was spent coordinating communication.   HISTORY OF PRESENT ILLNESS:  Jamie Boyd is a 68 y.o. year old female with multiple medical problems including Late onset CVA, subdural hematoma 8 / 5 / 2019, Alzheimer's dementia, iron deficiency anemia, GERD, goiter, allergies,hypercholesterolemia, history of urinary tract infection, hysterectomy. I called Jamie Boyd, Jamie Boyd's niece for scheduled telemedicine visit telephonic. We talked about purpose or follow-up palliative care visit with Jamie Boyd in agreement. We talked about how Jamie Boyd is doing. Jamie Boyd endorses she slowly declining. Functionally she is walking a  few steps. She has had multiple falls and has begun falling almost daily. She is total ADL care with incontinent bowel and bladder. She does required to be fed and her appetite remains good. Jamie Boyd endorses that she is gaining weight. We talked about chronic disease progression of dementia with expectations as advances. Jamie Boyd talked about the [facility] program as they completed an application there waiting to hear about Services. She has been attempting to contact the office about the [facility] program but has not been successful. We talked about getting the pallet of social worker involved to see if she can look into the process and resources available. Jamie Boyd in agreement and referral done for Jamie Boyd social worker palliative care. We talked about family dynamics. We talked about Hospice Services though at this point with Jamie. [place] gaining weight discussed with Jamie Boyd she would have to lose 10% of body weight for hospice eligibility. She has no wounds, no recent infections are ossicles ations. Medical goals continue to focus on comfort. We talked about role of palliative care and plan of care. No further changes at present time will continue to follow with next visit in 4 weeks or sooner should she declined, Jamie Boyd in agreement. Appointment is scheduled. Therapeutic listening and emotional support provided. Contact information provided. Questions answered to satisfaction.  Palliative Care was asked to help to continue to address goals of care.   CODE STATUS: DNR  PPS: 40% HOSPICE ELIGIBILITY/DIAGNOSIS: TBD  PAST MEDICAL HISTORY:  Past Medical History:  Diagnosis Date  . Alzheimer disease (Jamie Boyd)   . Anemia   . Dementia (Jamie Boyd)   . Goiter   . Memory deficit   . Urinary tract infection, site not specified     SOCIAL HX:  Social  History   Tobacco Use  . Smoking status: Former Smoker    Types: Cigarettes    Quit date: 01/25/2014    Years since quitting: 4.7  . Smokeless tobacco: Never Used  Substance Use  Topics  . Alcohol use: No    ALLERGIES:  Allergies  Allergen Reactions  . Pollen Extract Other (See Comments)     PERTINENT MEDICATIONS:  Outpatient Encounter Medications as of 10/17/2018  Medication Sig  . aspirin EC 81 MG tablet Take 81 mg by mouth daily.  Marland Kitchen atorvastatin (LIPITOR) 40 MG tablet Take 40 mg by mouth daily at 6 PM.  . Azelastine HCl 0.15 % SOLN Place 1 spray into the nose daily as needed (allergies).  . Azelastine HCl 137 MCG/SPRAY SOLN Place 1 spray into both nostrils 2 (two) times daily. Use in each nostril as directed   . diclofenac (VOLTAREN) 50 MG EC tablet Take 50 mg by mouth daily.  . divalproex (DEPAKOTE) 250 MG DR tablet Take 250 mg by mouth 3 (three) times daily.  Marland Kitchen donepezil (ARICEPT) 10 MG tablet Take 10 mg by mouth at bedtime.  Marland Kitchen doxycycline (VIBRA-TABS) 100 MG tablet Take 100 mg by mouth 2 (two) times daily.  Marland Kitchen esomeprazole (NEXIUM) 40 MG capsule Take 40 mg by mouth daily as needed (acid reflux).  . ferrous sulfate 325 (65 FE) MG EC tablet Take 325 mg by mouth daily with breakfast.  . fexofenadine (ALLEGRA) 180 MG tablet Take 180 mg by mouth daily.  Marland Kitchen glucosamine-chondroitin 500-400 MG tablet Take 1 tablet by mouth 3 (three) times daily.  Marland Kitchen guaiFENesin (ROBITUSSIN) 100 MG/5ML SOLN Take 5 mLs (100 mg total) by mouth every 4 (four) hours as needed for cough or to loosen phlegm.  . memantine (NAMENDA) 10 MG tablet Take 10 mg by mouth 2 (two) times daily.   . mirabegron ER (MYRBETRIQ) 50 MG TB24 tablet Take 1 tablet (50 mg total) by mouth daily.  . montelukast (SINGULAIR) 10 MG tablet Take 10 mg by mouth at bedtime.  . Multiple Vitamins-Minerals (CENTRUM SILVER) tablet Take 1 tablet by mouth daily.  . nicotine polacrilex (COMMIT) 4 MG lozenge Take 4 mg by mouth as needed for smoking cessation.  Marland Kitchen omega-3 acid ethyl esters (LOVAZA) 1 g capsule Take 1 g by mouth 2 (two) times daily.  . phenazopyridine (PYRIDIUM) 200 MG tablet Take 200 mg by mouth 3 (three) times  daily with meals.  Marland Kitchen QUEtiapine (SEROQUEL) 100 MG tablet Take 100 mg by mouth at bedtime.  . traZODone (DESYREL) 100 MG tablet Take 100 mg by mouth at bedtime.  . vitamin E 400 UNIT capsule Take 400 Units by mouth daily.   No facility-administered encounter medications on file as of 10/17/2018.     PHYSICAL EXAM:  Deferred  Christin Z Gusler, NP

## 2018-11-28 ENCOUNTER — Encounter: Payer: Self-pay | Admitting: Nurse Practitioner

## 2018-11-28 ENCOUNTER — Other Ambulatory Visit: Payer: Self-pay

## 2018-11-28 ENCOUNTER — Other Ambulatory Visit: Payer: Medicare HMO | Admitting: Nurse Practitioner

## 2018-11-28 DIAGNOSIS — Z515 Encounter for palliative care: Secondary | ICD-10-CM

## 2018-11-28 NOTE — Progress Notes (Signed)
[facility] Pall[facility] Consult Note Telephone: [phone]  Fax: [phone]  PATIENT NAME: Jamie Boyd DOB: 1950-04-11 MRN: [id number]  PRIMARY CARE PROVIDER:   Sharyne Peach, MD  REFERRING PROVIDER:  Sharyne Peach, MD [place],  [place] [address]   RESPONSIBLE PARTY:   Nedra Stilp niece [phone]  Due to the COVID-19 crisis, this visit was done via telemedicine from my office and it was initiated and consent by this patient and or family.  RECOMMENDATIONS and PLAN: 1. ACP: DNR: Medical goals focus on comfort, monitoring for further decline as she continues to functionally but continues with good appetite, once looses 10% body weight will re-visit hospice services  2.Memory loss secondary to dementia. Continue with supportive measures as chronic disease remains Progressive.  3. Palliative care encounter ; Palliative medicine team will continue to support patient, patient's family, and medical team. Visit consisted of counseling and education dealing with the complex and emotionally intense issues of symptom management and palliative care in the setting of serious and potentially life-threatening illness  I spent 45 minutes providing this consultation,  from 1:00pm to 1:45pm. More than 50% of the time in this consultation was spent coordinating communication.   HISTORY OF PRESENT ILLNESS:  [name] is a 68 y.o. year old female with multiple medical problems including Late onset CVA, subdural hematoma 8 / 5 / 2019, Alzheimer's dementia, iron deficiency anemia, GERD, goiter, allergies,hypercholesterolemia, history of urinary tract infection, hysterectomy. I called [name], Ms. Sforza. We talked about purpose of follow-up palliative care visit. [name] n agreement. We talked about Ms. Donnellan and how she is feeling. [name] endorses that she seems to continue to be doing okay. She does have a good appetite. Ms. Suell has been feeding  herself finger foods. Ms. Geno does pocket food intermittently and has to be reminded to swallow. No signs of aspiration coughing or choking during eating. No noted weight loss. She has been receiving therapy at home and the therapist is present during palliative care visit. We talked about her functional-level. We talked about the challenges with chronic disease progression of dementia. We talked about inability to retain or process goals. We talked about her being able to stand and pivot as prior to she was not able to. She will take some steps and then decide to sit down wherever she is. We talked about therapy has two more weeks to work with her. Discussed at that time will schedule follow-up palliative care visit to see if any decline. [name] asked about [facility] and we talked about Hospice Services in general being a Medicare program and what is provided. At present time it does not appear without weight loss, ambulatory Ms. Detorres is eligible at this time but discuss will continue to follow a monitor for changes. We talked about recent UTI for what she starting an antibiotic today. We talked about the challenges with wearing adult pull-ups in the increased risk of infection. We talked about medical goals treat what is treatable. Discuss the role of palliative care and plan of care. Schedule follow-up palliative care visit for two weeks while therapist is in the home to review progress, appointments that. Therapeutic listening and emotional support provided. Contact information provided. Questions answered to satisfaction.  Palliative Care was asked to help to continue to address goals of care.   CODE STATUS: DNR  PPS: 40% HOSPICE ELIGIBILITY/DIAGNOSIS: TBD  PAST MEDICAL HISTORY:  Past Medical History:  Diagnosis Date  .  Alzheimer disease ([place])   . Anemia   . Dementia ([place])   . Goiter   . Memory deficit   . Urinary tract infection, site not specified     SOCIAL HX:  Social History    Tobacco Use  . Smoking status: Former Smoker    Types: Cigarettes    Quit date: 01/25/2014    Years since quitting: 4.8  . Smokeless tobacco: Never Used  Substance Use Topics  . Alcohol use: No    ALLERGIES:  Allergies  Allergen Reactions  . Pollen Extract Other (See Comments)     PERTINENT MEDICATIONS:  Outpatient Encounter Medications as of 11/28/2018  Medication Sig  . aspirin EC 81 MG tablet Take 81 mg by mouth daily.  Marland Kitchen atorvastatin (LIPITOR) 40 MG tablet Take 40 mg by mouth daily at 6 PM.  . Azelastine HCl 0.15 % SOLN Place 1 spray into the nose daily as needed (allergies).  . Azelastine HCl 137 MCG/SPRAY SOLN Place 1 spray into both nostrils 2 (two) times daily. Use in each nostril as directed   . diclofenac (VOLTAREN) 50 MG EC tablet Take 50 mg by mouth daily.  . divalproex (DEPAKOTE) 250 MG DR tablet Take 250 mg by mouth 3 (three) times daily.  Marland Kitchen donepezil (ARICEPT) 10 MG tablet Take 10 mg by mouth at bedtime.  Marland Kitchen doxycycline (VIBRA-TABS) 100 MG tablet Take 100 mg by mouth 2 (two) times daily.  Marland Kitchen esomeprazole (NEXIUM) 40 MG capsule Take 40 mg by mouth daily as needed (acid reflux).  . ferrous sulfate 325 (65 FE) MG EC tablet Take 325 mg by mouth daily with breakfast.  . fexofenadine (ALLEGRA) 180 MG tablet Take 180 mg by mouth daily.  Marland Kitchen glucosamine-chondroitin 500-400 MG tablet Take 1 tablet by mouth 3 (three) times daily.  Marland Kitchen guaiFENesin (ROBITUSSIN) 100 MG/5ML SOLN Take 5 mLs (100 mg total) by mouth every 4 (four) hours as needed for cough or to loosen phlegm.  . memantine (NAMENDA) 10 MG tablet Take 10 mg by mouth 2 (two) times daily.   . mirabegron ER (MYRBETRIQ) 50 MG TB24 tablet Take 1 tablet (50 mg total) by mouth daily.  . montelukast (SINGULAIR) 10 MG tablet Take 10 mg by mouth at bedtime.  . Multiple Vitamins-Minerals (CENTRUM SILVER) tablet Take 1 tablet by mouth daily.  . nicotine polacrilex (COMMIT) 4 MG lozenge Take 4 mg by mouth as needed for smoking  cessation.  Marland Kitchen omega-3 acid ethyl esters (LOVAZA) 1 g capsule Take 1 g by mouth 2 (two) times daily.  . phenazopyridine (PYRIDIUM) 200 MG tablet Take 200 mg by mouth 3 (three) times daily with meals.  Marland Kitchen QUEtiapine (SEROQUEL) 100 MG tablet Take 100 mg by mouth at bedtime.  . traZODone (DESYREL) 100 MG tablet Take 100 mg by mouth at bedtime.  . vitamin E 400 UNIT capsule Take 400 Units by mouth daily.   No facility-administered encounter medications on file as of 11/28/2018.     PHYSICAL EXAM:   Deferred   Z , NP

## 2018-12-10 ENCOUNTER — Other Ambulatory Visit: Payer: Self-pay

## 2018-12-10 ENCOUNTER — Encounter: Payer: Self-pay | Admitting: Nurse Practitioner

## 2018-12-10 ENCOUNTER — Other Ambulatory Visit: Payer: Medicare HMO | Admitting: Nurse Practitioner

## 2018-12-10 DIAGNOSIS — Z515 Encounter for palliative care: Secondary | ICD-10-CM

## 2018-12-10 NOTE — Progress Notes (Signed)
[facility] Palliative Care Consult Note Telephone: [phone]  Fax: [phone]  PATIENT NAME: Jamie Boyd DOB: 05/09/1950 MRN: [id number]  PRIMARY CARE PROVIDER:   Sharyne Peach, MD  REFERRING PROVIDER:  Sharyne Peach, MD [place],  [place] [address] RESPONSIBLE PARTY:[name] Fridge niece [phone]  Due to the COVID-19 crisis, this visit was done via telemedicine from my office and it was initiated and consent by this patient and or family.  RECOMMENDATIONS and PLAN: 1.ACP: DNR: Medical goals focus on comfort, agree with Dr Melrose Nakayama, hospice referral  2.Memory loss secondary to dementia. Continue with supportive measures as chronic disease remains Progressive.  3.Palliative care encounter ; Palliative medicine team will continue to support patient, patient's family, and medical team. Visit consisted of counseling and education dealing with the complex and emotionally intense issues of symptom management and palliative care in the setting of serious and potentially life-threatening illness  I spent 35 minutes providing this consultation,  from 8:25am to 9:00am. More than 50% of the time in this consultation was spent coordinating communication.   HISTORY OF PRESENT ILLNESS:  Jamie Boyd is a 68 y.o. year old female with multiple medical problems including Late onset CVA, subdural hematoma 8 / 5 / 2019, Alzheimer's dementia, iron deficiency anemia, GERD, goiter, allergies,hypercholesterolemia, history of urinary tract infection, hysterectomy. I called [name], Ms Cammie's nice. Therapy has been completed and [name] endorses [employer] and Agricultural consultant office both recommended [employer] for Ms. Deignan. We talked about hospice being Medicare benefit program. We talked about the changes that have occurred with Ms. Merlino in the setting of chronic disease and natural aging. [name] endorses Dr Lannie Fields office was sending a hospice order  over. We talked about her functional decline as she now is able to take a few steps and sits down. Dr. Melrose Nakayama told [name] that that's due to the Parkinson's disease. She is incontinent bowel and bladder. She now requires to be fed as she is unable to feed herself. She is pocketing food and it does take them a very long time to get her to eat over 30 to 45 minutes as they have to repetitively remind her to chew and swallow. She is cognitively impaired as she does say a word here and there, word salad where six months ago she was able to answer questions. She does have repetitive urinary tract infections. She was receiving physical / occupational therapy at home although that has been discontinue. Medical goals are to focus on comfort with dnrm place. I discussed at length with [name]'s hospice criteria under Medicare benefit. Discuss will send information to the hospice Physicians to notify that a hospice order will be sent. Talked about role of palliative care and plan of care. No new changes at present time two goals of care continue focus on Comfort. Therapeutic listening and emotional support provided. Questions answered to satisfaction. Contact information. Did not schedule follow-up palliative care visit as pending hospice AV visit  Palliative Care was asked to help to continue to address goals of care.   CODE STATUS: DNR  PPS: 40% HOSPICE ELIGIBILITY/DIAGNOSIS: TBD  PAST MEDICAL HISTORY:  Past Medical History:  Diagnosis Date  . Alzheimer disease ([place])   . Anemia   . Dementia ([place])   . Goiter   . Memory deficit   . Urinary tract infection, site not specified     SOCIAL HX:  Social History   Tobacco Use  . Smoking status: Former Smoker  Types: Cigarettes    Quit date: 01/25/2014    Years since quitting: 4.8  . Smokeless tobacco: Never Used  Substance Use Topics  . Alcohol use: No    ALLERGIES:  Allergies  Allergen Reactions  . Pollen Extract Other (See Comments)     PERTINENT  MEDICATIONS:  Outpatient Encounter Medications as of 12/10/2018  Medication Sig  . aspirin EC 81 MG tablet Take 81 mg by mouth daily.  Marland Kitchen atorvastatin (LIPITOR) 40 MG tablet Take 40 mg by mouth daily at 6 PM.  . Azelastine HCl 0.15 % SOLN Place 1 spray into the nose daily as needed (allergies).  . Azelastine HCl 137 MCG/SPRAY SOLN Place 1 spray into both nostrils 2 (two) times daily. Use in each nostril as directed   . diclofenac (VOLTAREN) 50 MG EC tablet Take 50 mg by mouth daily.  . divalproex (DEPAKOTE) 250 MG DR tablet Take 250 mg by mouth 3 (three) times daily.  Marland Kitchen donepezil (ARICEPT) 10 MG tablet Take 10 mg by mouth at bedtime.  Marland Kitchen doxycycline (VIBRA-TABS) 100 MG tablet Take 100 mg by mouth 2 (two) times daily.  Marland Kitchen esomeprazole (NEXIUM) 40 MG capsule Take 40 mg by mouth daily as needed (acid reflux).  . ferrous sulfate 325 (65 FE) MG EC tablet Take 325 mg by mouth daily with breakfast.  . fexofenadine (ALLEGRA) 180 MG tablet Take 180 mg by mouth daily.  Marland Kitchen glucosamine-chondroitin 500-400 MG tablet Take 1 tablet by mouth 3 (three) times daily.  Marland Kitchen guaiFENesin (ROBITUSSIN) 100 MG/5ML SOLN Take 5 mLs (100 mg total) by mouth every 4 (four) hours as needed for cough or to loosen phlegm.  . memantine (NAMENDA) 10 MG tablet Take 10 mg by mouth 2 (two) times daily.   . mirabegron ER (MYRBETRIQ) 50 MG TB24 tablet Take 1 tablet (50 mg total) by mouth daily.  . montelukast (SINGULAIR) 10 MG tablet Take 10 mg by mouth at bedtime.  . Multiple Vitamins-Minerals (CENTRUM SILVER) tablet Take 1 tablet by mouth daily.  . nicotine polacrilex (COMMIT) 4 MG lozenge Take 4 mg by mouth as needed for smoking cessation.  Marland Kitchen omega-3 acid ethyl esters (LOVAZA) 1 g capsule Take 1 g by mouth 2 (two) times daily.  . phenazopyridine (PYRIDIUM) 200 MG tablet Take 200 mg by mouth 3 (three) times daily with meals.  Marland Kitchen QUEtiapine (SEROQUEL) 100 MG tablet Take 100 mg by mouth at bedtime.  . traZODone (DESYREL) 100 MG tablet Take  100 mg by mouth at bedtime.  . vitamin E 400 UNIT capsule Take 400 Units by mouth daily.   No facility-administered encounter medications on file as of 12/10/2018.     PHYSICAL EXAM:   Deferred  Christin Z Gusler, NP

## 2019-03-07 ENCOUNTER — Other Ambulatory Visit: Payer: Self-pay

## 2019-03-07 ENCOUNTER — Emergency Department
Admission: EM | Admit: 2019-03-07 | Discharge: 2019-03-07 | Disposition: A | Discharge status: Home / Self Care | Attending: Emergency Medicine | Admitting: Emergency Medicine

## 2019-03-07 ENCOUNTER — Emergency Department

## 2019-03-07 ENCOUNTER — Encounter: Payer: Self-pay | Admitting: *Deleted

## 2019-03-07 DIAGNOSIS — Z87891 Personal history of nicotine dependence: Secondary | ICD-10-CM | POA: Insufficient documentation

## 2019-03-07 DIAGNOSIS — Z20822 Contact with and (suspected) exposure to covid-19: Secondary | ICD-10-CM | POA: Diagnosis not present

## 2019-03-07 DIAGNOSIS — F0280 Dementia in other diseases classified elsewhere without behavioral disturbance: Secondary | ICD-10-CM | POA: Insufficient documentation

## 2019-03-07 DIAGNOSIS — M7989 Other specified soft tissue disorders: Secondary | ICD-10-CM

## 2019-03-07 DIAGNOSIS — N3000 Acute cystitis without hematuria: Secondary | ICD-10-CM

## 2019-03-07 DIAGNOSIS — R2231 Localized swelling, mass and lump, right upper limb: Secondary | ICD-10-CM | POA: Insufficient documentation

## 2019-03-07 DIAGNOSIS — G20 Parkinson's disease: Secondary | ICD-10-CM | POA: Insufficient documentation

## 2019-03-07 DIAGNOSIS — Z791 Long term (current) use of non-steroidal anti-inflammatories (NSAID): Secondary | ICD-10-CM | POA: Insufficient documentation

## 2019-03-07 DIAGNOSIS — G309 Alzheimer's disease, unspecified: Secondary | ICD-10-CM | POA: Insufficient documentation

## 2019-03-07 DIAGNOSIS — Z79899 Other long term (current) drug therapy: Secondary | ICD-10-CM | POA: Insufficient documentation

## 2019-03-07 DIAGNOSIS — Z7982 Long term (current) use of aspirin: Secondary | ICD-10-CM | POA: Diagnosis not present

## 2019-03-07 DIAGNOSIS — R609 Edema, unspecified: Secondary | ICD-10-CM

## 2019-03-07 HISTORY — DX: Parkinson's disease without dyskinesia, without mention of fluctuations: G20.A1

## 2019-03-07 HISTORY — DX: Parkinson's disease: G20

## 2019-03-07 LAB — URINALYSIS, COMPLETE (UACMP) WITH MICROSCOPIC
Bacteria, UA: NONE SEEN
Bilirubin Urine: NEGATIVE
Glucose, UA: NEGATIVE mg/dL
Hgb urine dipstick: NEGATIVE
Ketones, ur: 5 mg/dL — AB
Leukocytes,Ua: NEGATIVE
Nitrite: NEGATIVE
Protein, ur: 30 mg/dL — AB
Specific Gravity, Urine: 1.015 (ref 1.005–1.030)
pH: 6 (ref 5.0–8.0)

## 2019-03-07 MED ORDER — AMOXICILLIN-POT CLAVULANATE 250-62.5 MG/5ML PO SUSR: 875.0000 mg | Freq: Two times a day (BID) | ORAL | 0 refills | Status: AC

## 2019-03-07 MED ORDER — OXYCODONE HCL 5 MG/5ML PO SOLN: 2.5000 mg | ORAL | 0 refills | Status: DC | PRN

## 2019-03-07 MED ORDER — DIPHENHYDRAMINE HCL 12.5 MG/5ML PO ELIX
25.0000 mg | ORAL_SOLUTION | Freq: Once | ORAL | Status: AC
Start: 2019-03-07 — End: 2019-03-07
  Administered 2019-03-07: 18:00:00 25 mg via ORAL
  Filled 2019-03-07: qty 10

## 2019-03-07 NOTE — ED Notes (Signed)
Foley bag changed by this tech.

## 2019-03-07 NOTE — ED Notes (Signed)
[facility] called for transport home 

## 2019-03-07 NOTE — ED Notes (Signed)
[facility] at bedside.

## 2019-03-07 NOTE — ED Notes (Signed)
Dry brief placed 

## 2019-03-07 NOTE — ED Provider Notes (Signed)
[facility] Emergency Department Provider Note  ____________________________________________   First MD Initiated Contact with Patient 03/07/19 1552     (approximate)  I have reviewed the triage vital signs and the nursing notes.   HISTORY  Chief Complaint Arm Swelling    HPI Jazleen Arends is a 69 y.o. female  With dementia, Parkinson's, on Hospice here with reported arm swelling, cough. Pt was initially brought to her PCP today 2/2 ongoing RUE swelling, which has been treated with both NSAIDs and reportedly antibiotics. Pt seems to be uncomfortable when the arm is moved and it has been intermittently swelling, so she was reportedly sent here for eval of DVT. Pt has also had a mild dry cough for which she has been taking allergy medicine. No sputum production. She has no known COVID exposure. Caregiver has also noticed darker than usual urine so is requesting UA due to history of recurrent UTIs.  On my assessment, pt is calm. She orients to examiner but is nonverbal.   Level 5 caveat invoked as remainder of history, ROS, and physical exam limited due to patient's dementia and Parkinsons.         Past Medical History:  Diagnosis Date  . Alzheimer disease ([place])   . Anemia   . Dementia ([place])   . Goiter   . Memory deficit   . Parkinson's disease ([place])   . Urinary tract infection, site not specified     Patient Active Problem List   Diagnosis Date Noted  . Memory deficits 08/01/2018  . Palliative care encounter 06/24/2018  . Memory loss 06/24/2018  . Falls frequently 10/02/2017  . CVA (cerebral vascular accident) ([place]) 01/24/2017  . Dehydration 01/20/2017  . Failure to thrive in adult 01/20/2017  . Hypotension 01/20/2017  . Allergic rhinitis 07/12/2016  . Recurrent UTI (urinary tract infection) 07/12/2016  . Agitation 11/23/2015  . Moderate dementia, with behavioral disturbance ([place]) 11/23/2015  . Gastroesophageal reflux disease without  esophagitis 09/11/2015  . Iron deficiency anemia due to chronic blood loss 06/02/2015  . GERD without esophagitis 06/01/2015  . Difficulty sleeping 04/27/2015  . Moderate dementia without behavioral disturbance ([place]) 03/01/2015  . Acute UTI 01/29/2015  . Anemia 01/29/2015  . Memory deficit 01/29/2015    Past Surgical History:  Procedure Laterality Date  . COLONOSCOPY WITH PROPOFOL N/A 02/18/2015   Procedure: COLONOSCOPY WITH PROPOFOL;  Surgeon: Hulen Luster, MD;  Location[facility] ENDOSCOPY;  Service: Gastroenterology;  Laterality: N/A;  . ESOPHAGOGASTRODUODENOSCOPY N/A 02/18/2015   Procedure: ESOPHAGOGASTRODUODENOSCOPY (EGD);  Surgeon: Hulen Luster, MD;  Location: [facility] ENDOSCOPY;  Service: Gastroenterology;  Laterality: N/A;    Prior to Admission medications   Medication Sig Start Date End Date Taking? Authorizing Provider  amoxicillin-clavulanate (AUGMENTIN) 250-62.5 MG/5ML suspension Take 17.5 mLs (875 mg total) by mouth 2 (two) times daily for 7 days. 03/07/19 03/14/19  Duffy Bruce, MD  aspirin EC 81 MG tablet Take 81 mg by mouth daily.    [provider]  atorvastatin (LIPITOR) 40 MG tablet Take 40 mg by mouth daily at 6 PM.    [provider]  Azelastine HCl 0.15 % SOLN Place 1 spray into the nose daily as needed (allergies).    [provider]  Azelastine HCl 137 MCG/SPRAY SOLN Place 1 spray into both nostrils 2 (two) times daily. Use in each nostril as directed     [provider]  diclofenac (VOLTAREN) 50 MG EC tablet Take 50 mg by mouth daily.  [provider]  divalproex (DEPAKOTE) 250 MG DR tablet Take 250 mg by mouth 3 (three) times daily.    [provider]  donepezil (ARICEPT) 10 MG tablet Take 10 mg by mouth at bedtime.    [provider]  doxycycline (VIBRA-TABS) 100 MG tablet Take 100 mg by mouth 2 (two) times daily. 04/14/17   [provider]  esomeprazole (NEXIUM) 40 MG capsule Take 40 mg by mouth daily as  needed (acid reflux).    [provider]  ferrous sulfate 325 (65 FE) MG EC tablet Take 325 mg by mouth daily with breakfast.    [provider]  fexofenadine (ALLEGRA) 180 MG tablet Take 180 mg by mouth daily.    [provider]  glucosamine-chondroitin 500-400 MG tablet Take 1 tablet by mouth 3 (three) times daily.    [provider]  guaiFENesin (ROBITUSSIN) 100 MG/5ML SOLN Take 5 mLs (100 mg total) by mouth every 4 (four) hours as needed for cough or to loosen phlegm. 03/30/18   Triplett, Johnette Abraham B, FNP  memantine (NAMENDA) 10 MG tablet Take 10 mg by mouth 2 (two) times daily.  11/23/15 11/27/17  [provider]  mirabegron ER (MYRBETRIQ) 50 MG TB24 tablet Take 1 tablet (50 mg total) by mouth daily. 03/19/17   Zara Council A, PA-C  montelukast (SINGULAIR) 10 MG tablet Take 10 mg by mouth at bedtime.    [provider]  Multiple Vitamins-Minerals (CENTRUM SILVER) tablet Take 1 tablet by mouth daily.    [provider]  nicotine polacrilex (COMMIT) 4 MG lozenge Take 4 mg by mouth as needed for smoking cessation.    [provider]  omega-3 acid ethyl esters (LOVAZA) 1 g capsule Take 1 g by mouth 2 (two) times daily.    [provider]  oxyCODONE (ROXICODONE) 5 MG/5ML solution Take 2.5 mLs (2.5 mg total) by mouth every 4 (four) hours as needed for severe pain. Take 2.5 mg (2.5 mL) for severe pain or 5 mg (5 mL) for breakthrough pain. 03/07/19   Duffy Bruce, MD  phenazopyridine (PYRIDIUM) 200 MG tablet Take 200 mg by mouth 3 (three) times daily with meals.    [provider]  QUEtiapine (SEROQUEL) 100 MG tablet Take 100 mg by mouth at bedtime.    [provider]  traZODone (DESYREL) 100 MG tablet Take 100 mg by mouth at bedtime.    [provider]  vitamin E 400 UNIT capsule Take 400 Units by mouth daily.    [provider]    Allergies Pollen extract  Family History  Problem  Relation Age of Onset  . Diabetes Maternal Uncle   . Prostate cancer Neg Hx   . Bladder Cancer Neg Hx   . Kidney cancer Neg Hx     Social History Social History   Tobacco Use  . Smoking status: Former Smoker    Types: Cigarettes    Quit date: 01/25/2014    Years since quitting: 5.1  . Smokeless tobacco: Never Used  Substance Use Topics  . Alcohol use: No  . Drug use: No    Review of Systems  Review of Systems  Unable to perform ROS: Patient nonverbal     ____________________________________________  PHYSICAL EXAM:      VITAL SIGNS: ED Triage Vitals  Enc Vitals Group     BP 03/07/19 1551 128/83     Pulse Rate 03/07/19 1551 78     Resp 03/07/19 1551 18  Temp 03/07/19 1551 99.2 F (37.3 C)     Temp Source 03/07/19 1551 Oral     SpO2 03/07/19 1551 94 %     Weight 03/07/19 1552 190 lb (86.2 kg)     Height 03/07/19 1552 5\' 2"  (1.575 m)     Head Circumference --      Peak Flow --      Pain Score --      Pain Loc --      Pain Edu? --      Excl. in [place]? --      Physical Exam Vitals and nursing note reviewed.  Constitutional:      General: She is not in acute distress.    Appearance: She is well-developed.  HENT:     Head: Normocephalic and atraumatic.  Eyes:     Conjunctiva/sclera: Conjunctivae normal.  Cardiovascular:     Rate and Rhythm: Normal rate and regular rhythm.     Heart sounds: Normal heart sounds.  Pulmonary:     Effort: Pulmonary effort is normal. No respiratory distress.     Breath sounds: No wheezing.  Abdominal:     General: There is no distension.  Musculoskeletal:     Cervical back: Neck supple.     Comments: Trace edema noted to RUE, bilateral LE. No warmth or erythema.   Skin:    General: Skin is warm.     Capillary Refill: Capillary refill takes less than 2 seconds.     Findings: No rash.  Neurological:     Mental Status: She is alert. Mental status is at baseline.     Motor: No abnormal muscle tone.     Comments: At mental  baseline per caregiver. Intermittent tremors which are baseline.        ____________________________________________   LABS (all labs ordered are listed, but only abnormal results are displayed)  Labs Reviewed  URINALYSIS, COMPLETE (UACMP) WITH MICROSCOPIC - Abnormal; Notable for the following components:      Result Value   Color, Urine YELLOW (*)    APPearance CLEAR (*)    Ketones, ur 5 (*)    Protein, ur 30 (*)    All other components within normal limits  URINE CULTURE  NOVEL CORONAVIRUS, NAA (HOSP ORDER, SEND-OUT TO REF LAB; TAT 18-24 HRS)    ____________________________________________  EKG: Normal sinus rhythm, VR 70. QRS 72, QTc 445. No acute St elevations or depressions. No ischemia or infarct. ________________________________________  RADIOLOGY All imaging, including plain films, CT scans, and ultrasounds, independently reviewed by me, and interpretations confirmed via formal radiology reads.  ED MD interpretation:   [place] RUE: Neg for DVT CXR: Bibasilar atelectasis, possible atypical infection  Official radiology report(s): US Venous Img Upper Uni Right(DVT)  Result Date: 03/07/2019 CLINICAL DATA:  Right upper extremity pain and edema for the past 2 months. Former smoker. Evaluate for DVT. EXAM: RIGHT UPPER EXTREMITY VENOUS DOPPLER ULTRASOUND TECHNIQUE: Gray-scale sonography with graded compression, as well as color Doppler and duplex ultrasound were performed to evaluate the upper extremity deep venous system from the level of the subclavian vein and including the jugular, axillary, basilic, radial, ulnar and upper cephalic vein. Spectral Doppler was utilized to evaluate flow at rest and with distal augmentation maneuvers. COMPARISON:  None. FINDINGS: Contralateral Subclavian Vein: Respiratory phasicity is normal and symmetric with the symptomatic side. No evidence of thrombus. Normal compressibility. Internal Jugular Vein: No evidence of thrombus. Normal  compressibility, respiratory phasicity and response to augmentation. Subclavian Vein: No  evidence of thrombus. Normal compressibility, respiratory phasicity and response to augmentation. Axillary Vein: No evidence of thrombus. Normal compressibility, respiratory phasicity and response to augmentation. Cephalic Vein: No evidence of thrombus. Normal compressibility, respiratory phasicity and response to augmentation. Basilic Vein: No evidence of thrombus. Normal compressibility, respiratory phasicity and response to augmentation. Brachial Veins: No evidence of thrombus. Normal compressibility, respiratory phasicity and response to augmentation. Radial Veins: No evidence of thrombus. Normal compressibility, respiratory phasicity and response to augmentation. Ulnar Veins: No evidence of thrombus. Normal compressibility, respiratory phasicity and response to augmentation. Venous Reflux:  None visualized. Other Findings:  None visualized. IMPRESSION: No evidence of DVT within the right upper extremity. Electronically Signed   By: Sandi Mariscal M.D.   On: 03/07/2019 17:27   DG Chest Portable 1 View  Result Date: 03/07/2019 CLINICAL DATA:  68 year old female with shortness of breath. Unexplained persistent right arm swelling. Dark urine. Possible aspiration. EXAM: PORTABLE CHEST 1 VIEW COMPARISON:  Chest radiographs 03/30/2018. FINDINGS: Seated AP portable view at 1708 hours. Lower lung volumes with bibasilar increased reticulonodular opacity. No superimposed pneumothorax. Upper lung pulmonary vascularity appears normal. No definite consolidation or pleural effusion. Mediastinal contours remain within normal limits. Visualized tracheal air column is within normal limits. Negative visible bowel gas pattern. No acute osseous abnormality identified. IMPRESSION: Lower lung volumes with bibasilar increased interstitial markings. Top differential considerations include atelectasis and viral/atypical respiratory infection.  Aspiration felt less likely. No strong evidence of pulmonary edema or pleural effusion. Electronically Signed   By: Genevie Ann M.D.   On: 03/07/2019 17:43    ____________________________________________  PROCEDURES   Procedure(s) performed (including Critical Care):  Procedures  ____________________________________________  INITIAL IMPRESSION / MDM / [place] / ED COURSE  As part of my medical decision making, I reviewed the following data within the [place] notes reviewed and incorporated, Old chart reviewed, Notes from prior ED visits, and [place] Controlled Substance Database       *Arvilla Homsey was evaluated in Emergency Department on 03/07/2019 for the symptoms described in the history of present illness. She was evaluated in the context of the global COVID-19 pandemic, which necessitated consideration that the patient might be at risk for infection with the SARS-CoV-2 virus that causes COVID-19. Institutional protocols and algorithms that pertain to the evaluation of patients at risk for COVID-19 are in a state of rapid change based on information released by regulatory bodies including the CDC and federal and state organizations. These policies and algorithms were followed during the patient's care in the ED.  Some ED evaluations and interventions may be delayed as a result of limited staffing during the pandemic.*     Medical Decision Making:  69 yo F with h/o severe dementia, on Hospice, here for evaluation of multiple complaints.   Right arm swelling and pain: -DVT study neg -Distal pulse intact, good cap refill, strength and sensation seems intact -No evidence of cellulitis -Suspect this could be positional 2/2 how she lies in bed, versus related to a chronic cervical radiculopathy -No apparent emergent pathology -Given apparent distress, will trial analgesics  Cough -Also chronic, likely 2/2 atelectasis though query aspiration PNA -COVID  sent per caregiver request -Augmentin liquid for possible aspiration -Not hypoxic, otherwise stable at this time  Urine color change -Foley replaced -UA with pyuria -Will be on Augmentin as above, though likely chronic colonization  Of note, pt developed mild urticarial rash in ED. Likely 2/2 exposure to tape/medical leads. Given benadryl  w/ resolution. No signs of anaphylaxis. Could also be related to underlying viral process.  ____________________________________________  FINAL CLINICAL IMPRESSION(S) / ED DIAGNOSES  Final diagnoses:  Edema  Acute cystitis without hematuria  Arm swelling     MEDICATIONS GIVEN DURING THIS VISIT:  Medications  diphenhydrAMINE (BENADRYL) 12.5 MG/5ML elixir 25 mg (25 mg Oral Given 03/07/19 1822)     ED Discharge Orders         Ordered    amoxicillin-clavulanate (AUGMENTIN) 250-62.5 MG/5ML suspension  2 times daily     03/07/19 1833    oxyCODONE (ROXICODONE) 5 MG/5ML solution  Every 4 hours PRN     03/07/19 1833           Note:  This document was prepared using Dragon voice recognition software and may include unintentional dictation errors.   Duffy Bruce, MD 03/07/19 2140

## 2019-03-07 NOTE — Progress Notes (Signed)
ED visit made. Patient is currently followed by [facility] hosp[facility] at home with a diagnosis of Parkinson's. She is a DNR code with out of facility DNR in place in the home and ACP documentation in [place]. Patient to the [facility] ED today form her Md appointment for evaluation of right arm swelling, which has been treated at home with Ibuprofen and antibiotics. Her hospice nurse had requested a mobile xray, however PCP felt patient should be seen at the office. Patient seen lying on the ED stretcher, alert, nonverbal, but expressive. Sister in law Lattie Haw present at bedside. Per chart note review and discussion with Lattie Haw and EDP Dr. Ellender Hose plan is for an ultrasound of her right arm and a chest xray as well as UA and culture. Foley bag has been changed by ED NT. Emotional support provided to [facility] and patient. Lattie Haw is aware that the hospice on call RN will call to check on patient. Lattie Haw also instructed to call the hospice [phone] number with any questions or concerns. Patient to ultrasound at end of visit. Flo Shanks BSN, RN, [place] [phone]

## 2019-03-07 NOTE — Discharge Instructions (Addendum)
For pain: Continue taking tylenol every 4-6 hours Start taking the Oxycodone for severe pain. You can start with 2.5 mg (2.5 mL) every 4-6 hours. If she does OK with this, without sedation or drowsiness or confusion, you can increase to up to 5 mg (5 mL) every 6 hours.  For swelling: Keep an eye on how Jamie Boyd is positioned to see if there's anything that can help  For UTI and possible early PNA: Take the Augmentin antibiotic

## 2019-03-07 NOTE — ED Triage Notes (Signed)
Per EMS report, Patient was transported to [facility] for c/o swelling in right arm for approximately two months. Caregiver also c/o that patient has dark urine that is new and possible aspiration pneumonia. Patient is non-verbal and has history of Alzheimer's and Parkinson.

## 2019-03-08 LAB — URINE CULTURE: Culture: NO GROWTH

## 2019-03-09 LAB — NOVEL CORONAVIRUS, NAA (HOSP ORDER, SEND-OUT TO REF LAB; TAT 18-24 HRS): SARS-CoV-2, NAA: NOT DETECTED

## 2019-06-02 ENCOUNTER — Inpatient Hospital Stay

## 2019-06-02 ENCOUNTER — Inpatient Hospital Stay: Admitting: Anesthesiology

## 2019-06-02 ENCOUNTER — Inpatient Hospital Stay
Admission: EM | Admit: 2019-06-02 | Discharge: 2019-06-06 | DRG: 853 | Disposition: A | Discharge status: Hospice | Attending: Internal Medicine | Admitting: Internal Medicine

## 2019-06-02 ENCOUNTER — Emergency Department

## 2019-06-02 ENCOUNTER — Encounter
Admission: EM | Disposition: A | Discharge status: Hospice | Payer: Self-pay | Source: Home / Self Care | Attending: Internal Medicine

## 2019-06-02 ENCOUNTER — Other Ambulatory Visit: Payer: Self-pay

## 2019-06-02 ENCOUNTER — Encounter: Payer: Self-pay | Admitting: Medical Oncology

## 2019-06-02 DIAGNOSIS — Z66 Do not resuscitate: Secondary | ICD-10-CM | POA: Diagnosis not present

## 2019-06-02 DIAGNOSIS — R6521 Severe sepsis with septic shock: Secondary | ICD-10-CM | POA: Diagnosis not present

## 2019-06-02 DIAGNOSIS — J9602 Acute respiratory failure with hypercapnia: Secondary | ICD-10-CM | POA: Diagnosis not present

## 2019-06-02 DIAGNOSIS — R7989 Other specified abnormal findings of blood chemistry: Secondary | ICD-10-CM | POA: Diagnosis present

## 2019-06-02 DIAGNOSIS — Z20822 Contact with and (suspected) exposure to covid-19: Secondary | ICD-10-CM | POA: Diagnosis present

## 2019-06-02 DIAGNOSIS — K921 Melena: Secondary | ICD-10-CM | POA: Diagnosis not present

## 2019-06-02 DIAGNOSIS — Z9981 Dependence on supplemental oxygen: Secondary | ICD-10-CM

## 2019-06-02 DIAGNOSIS — I4891 Unspecified atrial fibrillation: Secondary | ICD-10-CM | POA: Diagnosis not present

## 2019-06-02 DIAGNOSIS — R7401 Elevation of levels of liver transaminase levels: Secondary | ICD-10-CM | POA: Diagnosis present

## 2019-06-02 DIAGNOSIS — Z79899 Other long term (current) drug therapy: Secondary | ICD-10-CM

## 2019-06-02 DIAGNOSIS — R627 Adult failure to thrive: Secondary | ICD-10-CM

## 2019-06-02 DIAGNOSIS — I471 Supraventricular tachycardia: Secondary | ICD-10-CM | POA: Diagnosis present

## 2019-06-02 DIAGNOSIS — R4182 Altered mental status, unspecified: Secondary | ICD-10-CM | POA: Diagnosis not present

## 2019-06-02 DIAGNOSIS — Z6827 Body mass index (BMI) 27.0-27.9, adult: Secondary | ICD-10-CM

## 2019-06-02 DIAGNOSIS — N136 Pyonephrosis: Secondary | ICD-10-CM | POA: Diagnosis present

## 2019-06-02 DIAGNOSIS — F0280 Dementia in other diseases classified elsewhere without behavioral disturbance: Secondary | ICD-10-CM | POA: Diagnosis not present

## 2019-06-02 DIAGNOSIS — Z7982 Long term (current) use of aspirin: Secondary | ICD-10-CM | POA: Diagnosis not present

## 2019-06-02 DIAGNOSIS — G309 Alzheimer's disease, unspecified: Secondary | ICD-10-CM | POA: Diagnosis present

## 2019-06-02 DIAGNOSIS — J9601 Acute respiratory failure with hypoxia: Secondary | ICD-10-CM | POA: Diagnosis not present

## 2019-06-02 DIAGNOSIS — D696 Thrombocytopenia, unspecified: Secondary | ICD-10-CM | POA: Diagnosis present

## 2019-06-02 DIAGNOSIS — Z87891 Personal history of nicotine dependence: Secondary | ICD-10-CM | POA: Diagnosis not present

## 2019-06-02 DIAGNOSIS — G9341 Metabolic encephalopathy: Secondary | ICD-10-CM | POA: Diagnosis present

## 2019-06-02 DIAGNOSIS — I480 Paroxysmal atrial fibrillation: Secondary | ICD-10-CM | POA: Diagnosis present

## 2019-06-02 DIAGNOSIS — N390 Urinary tract infection, site not specified: Secondary | ICD-10-CM | POA: Diagnosis not present

## 2019-06-02 DIAGNOSIS — K625 Hemorrhage of anus and rectum: Secondary | ICD-10-CM | POA: Diagnosis not present

## 2019-06-02 DIAGNOSIS — Z87440 Personal history of urinary (tract) infections: Secondary | ICD-10-CM

## 2019-06-02 DIAGNOSIS — I639 Cerebral infarction, unspecified: Secondary | ICD-10-CM | POA: Diagnosis present

## 2019-06-02 DIAGNOSIS — Z515 Encounter for palliative care: Secondary | ICD-10-CM | POA: Diagnosis not present

## 2019-06-02 DIAGNOSIS — Z978 Presence of other specified devices: Secondary | ICD-10-CM

## 2019-06-02 DIAGNOSIS — M4856XA Collapsed vertebra, not elsewhere classified, lumbar region, initial encounter for fracture: Secondary | ICD-10-CM | POA: Diagnosis present

## 2019-06-02 DIAGNOSIS — A419 Sepsis, unspecified organism: Secondary | ICD-10-CM | POA: Diagnosis present

## 2019-06-02 DIAGNOSIS — N179 Acute kidney failure, unspecified: Secondary | ICD-10-CM

## 2019-06-02 DIAGNOSIS — D6489 Other specified anemias: Secondary | ICD-10-CM | POA: Diagnosis not present

## 2019-06-02 DIAGNOSIS — K219 Gastro-esophageal reflux disease without esophagitis: Secondary | ICD-10-CM | POA: Diagnosis present

## 2019-06-02 DIAGNOSIS — I4892 Unspecified atrial flutter: Secondary | ICD-10-CM | POA: Diagnosis present

## 2019-06-02 DIAGNOSIS — R570 Cardiogenic shock: Secondary | ICD-10-CM | POA: Diagnosis not present

## 2019-06-02 DIAGNOSIS — E43 Unspecified severe protein-calorie malnutrition: Secondary | ICD-10-CM | POA: Diagnosis present

## 2019-06-02 DIAGNOSIS — Z7401 Bed confinement status: Secondary | ICD-10-CM

## 2019-06-02 DIAGNOSIS — D500 Iron deficiency anemia secondary to blood loss (chronic): Secondary | ICD-10-CM | POA: Diagnosis present

## 2019-06-02 DIAGNOSIS — G20 Parkinson's disease: Secondary | ICD-10-CM | POA: Diagnosis present

## 2019-06-02 DIAGNOSIS — I350 Nonrheumatic aortic (valve) stenosis: Secondary | ICD-10-CM | POA: Diagnosis not present

## 2019-06-02 DIAGNOSIS — N132 Hydronephrosis with renal and ureteral calculous obstruction: Secondary | ICD-10-CM | POA: Diagnosis not present

## 2019-06-02 DIAGNOSIS — E870 Hyperosmolality and hypernatremia: Secondary | ICD-10-CM | POA: Diagnosis not present

## 2019-06-02 DIAGNOSIS — E861 Hypovolemia: Secondary | ICD-10-CM | POA: Diagnosis present

## 2019-06-02 DIAGNOSIS — Z8673 Personal history of transient ischemic attack (TIA), and cerebral infarction without residual deficits: Secondary | ICD-10-CM

## 2019-06-02 DIAGNOSIS — E785 Hyperlipidemia, unspecified: Secondary | ICD-10-CM | POA: Diagnosis present

## 2019-06-02 DIAGNOSIS — Z7189 Other specified counseling: Secondary | ICD-10-CM | POA: Diagnosis not present

## 2019-06-02 DIAGNOSIS — R945 Abnormal results of liver function studies: Secondary | ICD-10-CM | POA: Diagnosis not present

## 2019-06-02 DIAGNOSIS — D62 Acute posthemorrhagic anemia: Secondary | ICD-10-CM | POA: Diagnosis not present

## 2019-06-02 DIAGNOSIS — D72829 Elevated white blood cell count, unspecified: Secondary | ICD-10-CM | POA: Diagnosis not present

## 2019-06-02 DIAGNOSIS — D509 Iron deficiency anemia, unspecified: Secondary | ICD-10-CM | POA: Diagnosis present

## 2019-06-02 HISTORY — PX: CYSTOSCOPY WITH STENT PLACEMENT: SHX5790

## 2019-06-02 LAB — URINALYSIS, COMPLETE (UACMP) WITH MICROSCOPIC
Glucose, UA: NEGATIVE mg/dL
Ketones, ur: NEGATIVE mg/dL
Nitrite: NEGATIVE
Protein, ur: 100 mg/dL — AB
Specific Gravity, Urine: 1.015 (ref 1.005–1.030)
pH: 7 (ref 5.0–8.0)

## 2019-06-02 LAB — MRSA PCR SCREENING: MRSA by PCR: NEGATIVE

## 2019-06-02 LAB — COMPREHENSIVE METABOLIC PANEL
ALT: 94 U/L — ABNORMAL HIGH (ref 0–44)
AST: 104 U/L — ABNORMAL HIGH (ref 15–41)
Albumin: 2 g/dL — ABNORMAL LOW (ref 3.5–5.0)
Alkaline Phosphatase: 245 U/L — ABNORMAL HIGH (ref 38–126)
Anion gap: 13 (ref 5–15)
BUN: 139 mg/dL — ABNORMAL HIGH (ref 8–23)
CO2: 19 mmol/L — ABNORMAL LOW (ref 22–32)
Calcium: 8.6 mg/dL — ABNORMAL LOW (ref 8.9–10.3)
Chloride: 111 mmol/L (ref 98–111)
Creatinine, Ser: 3.55 mg/dL — ABNORMAL HIGH (ref 0.44–1.00)
GFR calc Af Amer: 14 mL/min — ABNORMAL LOW (ref >60)
GFR calc non Af Amer: 12 mL/min — ABNORMAL LOW (ref >60)
Glucose, Bld: 137 mg/dL — ABNORMAL HIGH (ref 70–99)
Potassium: 4.8 mmol/L (ref 3.5–5.1)
Sodium: 143 mmol/L (ref 135–145)
Total Bilirubin: 8.3 mg/dL — ABNORMAL HIGH (ref 0.3–1.2)
Total Protein: 7.5 g/dL (ref 6.5–8.1)

## 2019-06-02 LAB — BLOOD GAS, ARTERIAL
Acid-base deficit: 9.4 mmol/L — ABNORMAL HIGH (ref 0.0–2.0)
Bicarbonate: 15.6 mmol/L — ABNORMAL LOW (ref 20.0–28.0)
FIO2: 0.5
MECHVT: 450 mL
O2 Saturation: 99.4 %
PEEP: 5 cmH2O
Patient temperature: 37
RATE: 18 resp/min
pCO2 arterial: 31 mmHg — ABNORMAL LOW (ref 32.0–48.0)
pH, Arterial: 7.31 — ABNORMAL LOW (ref 7.350–7.450)
pO2, Arterial: 166 mmHg — ABNORMAL HIGH (ref 83.0–108.0)

## 2019-06-02 LAB — CBC WITH DIFFERENTIAL/PLATELET
Abs Immature Granulocytes: 1.57 10*3/uL — ABNORMAL HIGH (ref 0.00–0.07)
Basophils Absolute: 0.2 10*3/uL — ABNORMAL HIGH (ref 0.0–0.1)
Basophils Relative: 1 %
Eosinophils Absolute: 0.2 10*3/uL (ref 0.0–0.5)
Eosinophils Relative: 1 %
HCT: 26.9 % — ABNORMAL LOW (ref 36.0–46.0)
Hemoglobin: 9.9 g/dL — ABNORMAL LOW (ref 12.0–15.0)
Immature Granulocytes: 8 %
Lymphocytes Relative: 10 %
Lymphs Abs: 2 10*3/uL (ref 0.7–4.0)
MCH: 28.5 pg (ref 26.0–34.0)
MCHC: 36.8 g/dL — ABNORMAL HIGH (ref 30.0–36.0)
MCV: 77.5 fL — ABNORMAL LOW (ref 80.0–100.0)
Monocytes Absolute: 0.9 10*3/uL (ref 0.1–1.0)
Monocytes Relative: 4 %
Neutro Abs: 15.2 10*3/uL — ABNORMAL HIGH (ref 1.7–7.7)
Neutrophils Relative %: 76 %
Platelets: 69 10*3/uL — ABNORMAL LOW (ref 150–400)
RBC: 3.47 MIL/uL — ABNORMAL LOW (ref 3.87–5.11)
RDW: 14.8 % (ref 11.5–15.5)
Smear Review: DECREASED
WBC: 20 10*3/uL — ABNORMAL HIGH (ref 4.0–10.5)
nRBC: 0.1 % (ref 0.0–0.2)

## 2019-06-02 LAB — LACTATE DEHYDROGENASE: LDH: 326 U/L — ABNORMAL HIGH (ref 98–192)

## 2019-06-02 LAB — TROPONIN I (HIGH SENSITIVITY): Troponin I (High Sensitivity): 14 ng/L (ref <18)

## 2019-06-02 LAB — PROTIME-INR
INR: 1.5 — ABNORMAL HIGH (ref 0.8–1.2)
Prothrombin Time: 17.6 seconds — ABNORMAL HIGH (ref 11.4–15.2)

## 2019-06-02 LAB — SAVE SMEAR(SSMR), FOR PROVIDER SLIDE REVIEW

## 2019-06-02 LAB — RESPIRATORY PANEL BY RT PCR (FLU A&B, COVID)
Influenza A by PCR: NEGATIVE
Influenza B by PCR: NEGATIVE
SARS Coronavirus 2 by RT PCR: NEGATIVE

## 2019-06-02 LAB — PROCALCITONIN: Procalcitonin: 11.21 ng/mL

## 2019-06-02 LAB — APTT: aPTT: 32 seconds (ref 24–36)

## 2019-06-02 LAB — LACTIC ACID, PLASMA: Lactic Acid, Venous: 1.4 mmol/L (ref 0.5–1.9)

## 2019-06-02 SURGERY — CYSTOSCOPY, WITH STENT INSERTION
Anesthesia: General | Site: Ureter | Laterality: Left

## 2019-06-02 MED ORDER — SODIUM CHLORIDE 0.9 % IV BOLUS: 1000.0000 mL | Freq: Once | INTRAVENOUS | Status: DC

## 2019-06-02 MED ORDER — PROPOFOL 10 MG/ML IV BOLUS
INTRAVENOUS | Status: DC | PRN
  Administered 2019-06-02: 50 mg via INTRAVENOUS
  Administered 2019-06-02: 120 mg via INTRAVENOUS

## 2019-06-02 MED ORDER — DILTIAZEM HCL 25 MG/5ML IV SOLN
10.0000 mg | Freq: Once | INTRAVENOUS | Status: AC
  Administered 2019-06-02: 10 mg via INTRAVENOUS
  Filled 2019-06-02: qty 5

## 2019-06-02 MED ORDER — IPRATROPIUM BROMIDE 0.02 % IN SOLN
0.5000 mg | Freq: Four times a day (QID) | RESPIRATORY_TRACT | Status: DC
  Administered 2019-06-02 – 2019-06-03 (×3): 0.5 mg via RESPIRATORY_TRACT
  Filled 2019-06-02 (×3): qty 2.5

## 2019-06-02 MED ORDER — MIDAZOLAM HCL 2 MG/2ML IJ SOLN
1.0000 mg | INTRAMUSCULAR | Status: DC | PRN
  Administered 2019-06-03 (×2): 1 mg via INTRAVENOUS
  Filled 2019-06-02 (×2): qty 2

## 2019-06-02 MED ORDER — FENTANYL CITRATE (PF) 100 MCG/2ML IJ SOLN: 25.0000 ug | INTRAMUSCULAR | Status: DC | PRN

## 2019-06-02 MED ORDER — PHENYLEPHRINE HCL (PRESSORS) 10 MG/ML IV SOLN
INTRAVENOUS | Status: DC | PRN
  Administered 2019-06-02 (×3): 100 ug via INTRAVENOUS

## 2019-06-02 MED ORDER — ONDANSETRON HCL 4 MG/2ML IJ SOLN: 4.0000 mg | Freq: Once | INTRAMUSCULAR | Status: DC | PRN

## 2019-06-02 MED ORDER — VASOPRESSIN 20 UNIT/ML IV SOLN
INTRAVENOUS | Status: DC | PRN
Start: 2019-06-02 — End: 2019-06-02
  Administered 2019-06-02: 2 [IU] via INTRAVENOUS
  Administered 2019-06-02: 1 [IU] via INTRAVENOUS

## 2019-06-02 MED ORDER — MIDAZOLAM HCL 2 MG/2ML IJ SOLN
2.0000 mg | Freq: Once | INTRAMUSCULAR | Status: AC
  Administered 2019-06-02: 22:00:00 2 mg via INTRAVENOUS

## 2019-06-02 MED ORDER — SODIUM CHLORIDE 0.9 % IV SOLN: INTRAVENOUS | Status: DC

## 2019-06-02 MED ORDER — FENTANYL CITRATE (PF) 100 MCG/2ML IJ SOLN
INTRAMUSCULAR | Status: AC
  Filled 2019-06-02: qty 2

## 2019-06-02 MED ORDER — FENTANYL CITRATE (PF) 100 MCG/2ML IJ SOLN
INTRAMUSCULAR | Status: DC | PRN
  Administered 2019-06-02 (×2): 50 ug via INTRAVENOUS

## 2019-06-02 MED ORDER — VANCOMYCIN VARIABLE DOSE PER UNSTABLE RENAL FUNCTION (PHARMACIST DOSING): Status: DC

## 2019-06-02 MED ORDER — SODIUM CHLORIDE 0.9 % IV SOLN
1.0000 g | Freq: Once | INTRAVENOUS | Status: AC
  Administered 2019-06-02: 1 g via INTRAVENOUS
  Filled 2019-06-02: qty 1

## 2019-06-02 MED ORDER — DILTIAZEM HCL-DEXTROSE 125-5 MG/125ML-% IV SOLN (PREMIX)
5.0000 mg/h | INTRAVENOUS | Status: DC
  Administered 2019-06-02: 5 mg/h via INTRAVENOUS
  Filled 2019-06-02: qty 125

## 2019-06-02 MED ORDER — IOHEXOL 180 MG/ML  SOLN
INTRAMUSCULAR | Status: DC | PRN
  Administered 2019-06-02: 20 mL

## 2019-06-02 MED ORDER — FAMOTIDINE IN NACL 20-0.9 MG/50ML-% IV SOLN
20.0000 mg | Freq: Two times a day (BID) | INTRAVENOUS | Status: DC
  Filled 2019-06-02: qty 50

## 2019-06-02 MED ORDER — SUCCINYLCHOLINE CHLORIDE 20 MG/ML IJ SOLN
INTRAMUSCULAR | Status: DC | PRN
  Administered 2019-06-02: 50 mg via INTRAVENOUS

## 2019-06-02 MED ORDER — NOREPINEPHRINE 4 MG/250ML-% IV SOLN: 2.0000 ug/min | INTRAVENOUS | Status: DC

## 2019-06-02 MED ORDER — DILTIAZEM HCL-DEXTROSE 125-5 MG/125ML-% IV SOLN (PREMIX): 5.0000 mg/h | INTRAVENOUS | Status: DC

## 2019-06-02 MED ORDER — SODIUM CHLORIDE 0.9 % IV SOLN
0.0000 ug/min | INTRAVENOUS | Status: DC
  Filled 2019-06-02: qty 1

## 2019-06-02 MED ORDER — CHLORHEXIDINE GLUCONATE CLOTH 2 % EX PADS
6.0000 | MEDICATED_PAD | Freq: Every day | CUTANEOUS | Status: DC
  Administered 2019-06-03 – 2019-06-05 (×3): 6 via TOPICAL

## 2019-06-02 MED ORDER — VANCOMYCIN HCL 1750 MG/350ML IV SOLN
1750.0000 mg | Freq: Once | INTRAVENOUS | Status: AC
  Administered 2019-06-02: 14:00:00 1750 mg via INTRAVENOUS
  Filled 2019-06-02: qty 350

## 2019-06-02 MED ORDER — SODIUM CHLORIDE 0.9 % IV BOLUS
2000.0000 mL | Freq: Once | INTRAVENOUS | Status: AC
  Administered 2019-06-02: 2000 mL via INTRAVENOUS

## 2019-06-02 MED ORDER — ONDANSETRON HCL 4 MG/2ML IJ SOLN: 4.0000 mg | Freq: Three times a day (TID) | INTRAMUSCULAR | Status: DC | PRN

## 2019-06-02 MED ORDER — SODIUM CHLORIDE 0.9 % IV SOLN
1.0000 g | Freq: Two times a day (BID) | INTRAVENOUS | Status: DC
  Administered 2019-06-02 – 2019-06-03 (×2): 1 g via INTRAVENOUS
  Filled 2019-06-02 (×5): qty 1

## 2019-06-02 MED ORDER — LACTATED RINGERS IV SOLN: INTRAVENOUS | Status: DC | PRN

## 2019-06-02 MED ORDER — LIDOCAINE HCL (CARDIAC) PF 100 MG/5ML IV SOSY
PREFILLED_SYRINGE | INTRAVENOUS | Status: DC | PRN
  Administered 2019-06-02: 100 mg via INTRAVENOUS

## 2019-06-02 MED ORDER — SODIUM CHLORIDE 0.9 % IV SOLN: Freq: Once | INTRAVENOUS | Status: AC

## 2019-06-02 MED ORDER — ALBUTEROL SULFATE (2.5 MG/3ML) 0.083% IN NEBU: 2.5000 mg | INHALATION_SOLUTION | RESPIRATORY_TRACT | Status: DC | PRN

## 2019-06-02 MED ORDER — INSULIN ASPART 100 UNIT/ML ~~LOC~~ SOLN
0.0000 [IU] | SUBCUTANEOUS | Status: DC
  Filled 2019-06-02: qty 1

## 2019-06-02 MED ORDER — POLYETHYLENE GLYCOL 3350 17 G PO PACK: 17.0000 g | PACK | Freq: Every day | ORAL | Status: DC

## 2019-06-02 MED ORDER — PROPOFOL 1000 MG/100ML IV EMUL
0.0000 ug/kg/min | INTRAVENOUS | Status: DC
  Administered 2019-06-02: 23:00:00 10 ug/kg/min via INTRAVENOUS
  Administered 2019-06-03: 20 ug/kg/min via INTRAVENOUS
  Filled 2019-06-02 (×2): qty 100

## 2019-06-02 MED ORDER — MIDAZOLAM HCL 2 MG/2ML IJ SOLN: 2.0000 mg | Freq: Once | INTRAMUSCULAR | Status: AC

## 2019-06-02 MED ORDER — PROPOFOL 10 MG/ML IV BOLUS: INTRAVENOUS | Status: DC | PRN

## 2019-06-02 MED ORDER — DOCUSATE SODIUM 50 MG/5ML PO LIQD: 100.0000 mg | Freq: Two times a day (BID) | ORAL | Status: DC

## 2019-06-02 MED ORDER — PROPOFOL 500 MG/50ML IV EMUL
INTRAVENOUS | Status: AC
  Filled 2019-06-02: qty 50

## 2019-06-02 MED ORDER — PHENYLEPHRINE HCL-NACL 10-0.9 MG/250ML-% IV SOLN
INTRAVENOUS | Status: DC | PRN
  Administered 2019-06-02: 50 ug/min via INTRAVENOUS

## 2019-06-02 MED ORDER — MIDAZOLAM HCL 2 MG/2ML IJ SOLN
INTRAMUSCULAR | Status: AC
  Filled 2019-06-02: qty 2

## 2019-06-02 MED ORDER — SODIUM CHLORIDE 0.9 % IV SOLN
250.0000 mL | INTRAVENOUS | Status: DC
  Administered 2019-06-02: 250 mL via INTRAVENOUS

## 2019-06-02 MED ORDER — PHENYLEPHRINE HCL (PRESSORS) 10 MG/ML IV SOLN: INTRAVENOUS | Status: DC | PRN

## 2019-06-02 SURGICAL SUPPLY — 17 items
BAG DRAIN CYSTO-URO LG1000N (MISCELLANEOUS) ×6 IMPLANT
BRUSH SCRUB EZ  4% CHG (MISCELLANEOUS) ×2
BRUSH SCRUB EZ 4% CHG (MISCELLANEOUS) ×1 IMPLANT
CATH URETL 5X70 OPEN END (CATHETERS) ×3 IMPLANT
CONRAY 43 FOR UROLOGY 50M (MISCELLANEOUS) IMPLANT
DRAPE UTILITY 15X26 TOWEL STRL (DRAPES) ×3 IMPLANT
GOWN STRL REUS W/ TWL LRG LVL3 (GOWN DISPOSABLE) ×1 IMPLANT
GOWN STRL REUS W/TWL LRG LVL3 (GOWN DISPOSABLE) ×2
GUIDEWIRE STR DUAL SENSOR (WIRE) ×3 IMPLANT
KIT TURNOVER CYSTO (KITS) ×3 IMPLANT
PACK CYSTO AR (MISCELLANEOUS) ×3 IMPLANT
SET CYSTO W/LG BORE CLAMP LF (SET/KITS/TRAYS/PACK) ×3 IMPLANT
SOL .9 NS 3000ML IRR  AL (IV SOLUTION) ×2
SOL .9 NS 3000ML IRR UROMATIC (IV SOLUTION) ×1 IMPLANT
STENT URET 6FRX24 CONTOUR (STENTS) ×3 IMPLANT
SURGILUBE 2OZ TUBE FLIPTOP (MISCELLANEOUS) ×3 IMPLANT
WATER STERILE IRR 1000ML POUR (IV SOLUTION) ×3 IMPLANT

## 2019-06-02 NOTE — ED Notes (Signed)
Pt to CT

## 2019-06-02 NOTE — Anesthesia Procedure Notes (Signed)
Procedure Name: Intubation Date/Time: 06/02/2019 9:50 PM Performed by: [facility], CRNA Pre-anesthesia Checklist: Patient identified, Patient being monitored, Timeout performed, Emergency Drugs available and Suction available Patient Re-evaluated:Patient Re-evaluated prior to induction Oxygen Delivery Method: Circle system utilized Preoxygenation: Pre-oxygenation with 100% oxygen Induction Type: IV induction, Rapid sequence and Inhalational induction with existing ETT Laryngoscope Size: 3 and McGraph Grade View: Grade II Tube type: Oral Tube size: 7.0 mm Number of attempts: 1 Airway Equipment and Method: Stylet and Video-laryngoscopy Placement Confirmation: ETT inserted through vocal cords under direct vision,  positive ETCO2 and breath sounds checked- equal and bilateral Secured at: 21 cm Tube secured with: Tape Dental Injury: Teeth and Oropharynx as per pre-operative assessment

## 2019-06-02 NOTE — Anesthesia Preprocedure Evaluation (Addendum)
Anesthesia Evaluation  Patient identified by MRN, date of birth, ID band Patient unresponsive    Reviewed: Allergy & Precautions, NPO status , Patient's Chart, lab work & pertinent test results, Unable to perform ROS - Chart review only  History of Anesthesia Complications Negative for: history of anesthetic complications  Airway Mallampati: III       Dental   Pulmonary neg sleep apnea, neg COPD, Not current smoker, former smoker,           Cardiovascular (-) hypertension(-) Past MI and (-) CHF + dysrhythmias Atrial Fibrillation (-) Valvular Problems/Murmurs     Neuro/Psych neg Seizures Dementia CVA    GI/Hepatic GERD  Medicated and Controlled,  Endo/Other  neg diabetes  Renal/GU ARFRenal disease     Musculoskeletal   Abdominal   Peds  Hematology  (+) anemia ,   Anesthesia Other Findings   Reproductive/Obstetrics                             Anesthesia Physical Anesthesia Plan  ASA: III and emergent  Anesthesia Plan: General   Post-op Pain Management:    Induction: Intravenous  PONV Risk Score and Plan: 3 and Dexamethasone, Ondansetron and Midazolam  Airway Management Planned: Oral ETT  Additional Equipment:   Intra-op Plan:   Post-operative Plan: Possible Post-op intubation/ventilation  Informed Consent: I have reviewed the patients History and Physical, chart, labs and discussed the procedure including the risks, benefits and alternatives for the proposed anesthesia with the patient or authorized representative who has indicated his/her understanding and acceptance.       Plan Discussed with:   Anesthesia Plan Comments:        Anesthesia Quick Evaluation

## 2019-06-02 NOTE — ED Provider Notes (Signed)
[facility] Emergency Department Provider Note  Time seen: 10:36 AM  I have reviewed the triage vital signs and the nursing notes.   HISTORY  Chief Complaint Altered Mental Status   HPI Jamie Boyd is a 69 y.o. female with a past medical history of dementia, Parkinson's, CVA, presents to the emergency department for decreased responsiveness.  According to EMS patient's baseline is bedridden and largely nonverbal.  They state they were called out today because the patient seems less responsive today than normal.  Upon arrival patient is awake, does not follow commands or answer questions.  Patient will look at you when speaking.  Not entirely clear if this is the patient's baseline.  Patient wears 2 L of oxygen 24/7 per EMS, satting 88% on 2 L and placed on 4 L satting in the mid 90s.  No reported cough or fever.  Patient unable to provide any history or review of systems.   Past Medical History:  Diagnosis Date  . Alzheimer disease ([place])   . Anemia   . Dementia ([place])   . Goiter   . Memory deficit   . Parkinson's disease ([place])   . Urinary tract infection, site not specified     Patient Active Problem List   Diagnosis Date Noted  . Memory deficits 08/01/2018  . Palliative care encounter 06/24/2018  . Memory loss 06/24/2018  . Falls frequently 10/02/2017  . CVA (cerebral vascular accident) ([place]) 01/24/2017  . Dehydration 01/20/2017  . Failure to thrive in adult 01/20/2017  . Hypotension 01/20/2017  . Allergic rhinitis 07/12/2016  . Recurrent UTI (urinary tract infection) 07/12/2016  . Agitation 11/23/2015  . Moderate dementia, with behavioral disturbance ([place]) 11/23/2015  . Gastroesophageal reflux disease without esophagitis 09/11/2015  . Iron deficiency anemia due to chronic blood loss 06/02/2015  . GERD without esophagitis 06/01/2015  . Difficulty sleeping 04/27/2015  . Moderate dementia without behavioral disturbance ([place]) 03/01/2015  . Acute UTI  01/29/2015  . Anemia 01/29/2015  . Memory deficit 01/29/2015    Past Surgical History:  Procedure Laterality Date  . COLONOSCOPY WITH PROPOFOL N/A 02/18/2015   Procedure: COLONOSCOPY WITH PROPOFOL;  Surgeon: Hulen Luster, MD;  Location: [facility] ENDOSCOPY;  Service: Gastroenterology;  Laterality: N/A;  . ESOPHAGOGASTRODUODENOSCOPY N/A 02/18/2015   Procedure: ESOPHAGOGASTRODUODENOSCOPY (EGD);  Surgeon: Hulen Luster, MD;  Location: [facility] ENDOSCOPY;  Service: Gastroenterology;  Laterality: N/A;    Prior to Admission medications   Medication Sig Start Date End Date Taking? Authorizing Provider  aspirin EC 81 MG tablet Take 81 mg by mouth daily.    [provider]  atorvastatin (LIPITOR) 40 MG tablet Take 40 mg by mouth daily at 6 PM.    [provider]  Azelastine HCl 0.15 % SOLN Place 1 spray into the nose daily as needed (allergies).    [provider]  Azelastine HCl 137 MCG/SPRAY SOLN Place 1 spray into both nostrils 2 (two) times daily. Use in each nostril as directed     [provider]  diclofenac (VOLTAREN) 50 MG EC tablet Take 50 mg by mouth daily.    [provider]  divalproex (DEPAKOTE) 250 MG DR tablet Take 250 mg by mouth 3 (three) times daily.    [provider]  donepezil (ARICEPT) 10 MG tablet Take 10 mg by mouth at bedtime.    [provider]  doxycycline (VIBRA-TABS) 100 MG tablet Take 100 mg by mouth 2 (two) times daily. 04/14/17   [provider]  esomeprazole (NEXIUM) 40 MG capsule Take 40 mg by mouth daily as needed (acid reflux).    [provider]  ferrous sulfate 325 (65 FE) MG EC tablet Take 325 mg by mouth daily with breakfast.    [provider]  fexofenadine (ALLEGRA) 180 MG tablet Take 180 mg by mouth daily.    [provider]  glucosamine-chondroitin 500-400 MG tablet Take 1 tablet by mouth 3 (three) times daily.    [provider]  guaiFENesin (ROBITUSSIN) 100 MG/5ML  SOLN Take 5 mLs (100 mg total) by mouth every 4 (four) hours as needed for cough or to loosen phlegm. 03/30/18   Triplett, Johnette Abraham B, FNP  memantine (NAMENDA) 10 MG tablet Take 10 mg by mouth 2 (two) times daily.  11/23/15 11/27/17  [provider]  mirabegron ER (MYRBETRIQ) 50 MG TB24 tablet Take 1 tablet (50 mg total) by mouth daily. 03/19/17   Zara Council A, PA-C  montelukast (SINGULAIR) 10 MG tablet Take 10 mg by mouth at bedtime.    [provider]  Multiple Vitamins-Minerals (CENTRUM SILVER) tablet Take 1 tablet by mouth daily.    [provider]  nicotine polacrilex (COMMIT) 4 MG lozenge Take 4 mg by mouth as needed for smoking cessation.    [provider]  omega-3 acid ethyl esters (LOVAZA) 1 g capsule Take 1 g by mouth 2 (two) times daily.    [provider]  oxyCODONE (ROXICODONE) 5 MG/5ML solution Take 2.5 mLs (2.5 mg total) by mouth every 4 (four) hours as needed for severe pain. Take 2.5 mg (2.5 mL) for severe pain or 5 mg (5 mL) for breakthrough pain. 03/07/19   Duffy Bruce, MD  phenazopyridine (PYRIDIUM) 200 MG tablet Take 200 mg by mouth 3 (three) times daily with meals.    [provider]  QUEtiapine (SEROQUEL) 100 MG tablet Take 100 mg by mouth at bedtime.    [provider]  traZODone (DESYREL) 100 MG tablet Take 100 mg by mouth at bedtime.    [provider]  vitamin E 400 UNIT capsule Take 400 Units by mouth daily.    [provider]    Allergies  Allergen Reactions  . Pollen Extract Other (See Comments)    Family History  Problem Relation Age of Onset  . Diabetes Maternal Uncle   . Prostate cancer Neg Hx   . Bladder Cancer Neg Hx   . Kidney cancer Neg Hx     Social History Social History   Tobacco Use  . Smoking status: Former Smoker    Types: Cigarettes    Quit date: 01/25/2014    Years since quitting: 5.3  . Smokeless tobacco: Never Used  Substance Use Topics  . Alcohol  use: No  . Drug use: No    Review of Systems Unable to obtain an adequate/accurate review of systems secondary to baseline dementia.  ____________________________________________   PHYSICAL EXAM:  Constitutional: Patient is awake, does not follow commands or answer questions but does not appear to be in any discomfort.  No acute distress. Eyes: Normal exam ENT      Head: Normocephalic and atraumatic.      Mouth/Throat: Mucous membranes are moist. Cardiovascular: Normal rate, regular rhythm.  Respiratory: Normal respiratory effort without tachypnea nor retractions.  Mild left-sided rhonchi. Gastrointestinal: Soft and nontender. No distention.  No reaction to abdominal palpation. Musculoskeletal: Extremities appear atraumatic. Neurologic: Patient is awake.  Not answering questions or following commands, but per report this is  baseline. Skin:  Skin is warm, dry Psychiatric: Patient is calm.  No acute distress.  ____________________________________________    EKG  EKG viewed and interpreted by myself shows a sinus rhythm at 91 bpm with a narrow QRS, normal axis, normal intervals, no concerning ST changes.  ____________________________________________    RADIOLOGY  Chest x-ray shows opacities versus atelectasis.  ____________________________________________   INITIAL IMPRESSION / ASSESSMENT AND PLAN / ED COURSE  Pertinent labs & imaging results that were available during my care of the patient were reviewed by me and considered in my medical decision making (see chart for details).   Patient presents to the emergency department for decreased responsiveness.  Patient has a history of a CVA, dementia, is bedridden and nonverbal per EMS report.  Patient does have a slightly lower O2 saturation 88% on 2 L.  Appears to have mild left-sided rhonchi on exam.  Differential would include pneumonia, aspiration, dehydration, metabolic or electrolyte abnormality, infectious etiology.  We  will check labs, urine, chest x-ray and continue to closely monitor.  Patient's work-up is resulted showing significant leukocytosis as well as renal failure. No significant urinary tract infection. Chest x-ray appears more consistent with atelectasis, normal O2 saturation. Family is now here, we have had a long discussion with family as well as with hospice regarding patient's decline and possible end-of-life event. After long discussion regarding options such as hospice house or admission. Family has decided they would like the patient admitted for medical management at this time at least for a few days to see how she does. Patient appears quite dehydrated we will start on IV fluids. Patient is intermittently going into atrial fibrillation with rapid ventricular response. We will dose 10 mg of IV diltiazem. We will admit to the hospital service.  Kathrin Sandrock was evaluated in Emergency Department on 06/02/2019 for the symptoms described in the history of present illness. She was evaluated in the context of the global COVID-19 pandemic, which necessitated consideration that the patient might be at risk for infection with the SARS-CoV-2 virus that causes COVID-19. Institutional protocols and algorithms that pertain to the evaluation of patients at risk for COVID-19 are in a state of rapid change based on information released by regulatory bodies including the CDC and federal and state organizations. These policies and algorithms were followed during the patient's care in the ED.  ____________________________________________   FINAL CLINICAL IMPRESSION(S) / ED DIAGNOSES  Decreased responsiveness/altered mental status Acute renal failure   Harvest Dark, MD 06/02/19 1231

## 2019-06-02 NOTE — ED Notes (Signed)
Antibiotics started before cultures d/t cultures being ordered after antibiotics.  Lab contacted to obtain cultures d/t difficult stick

## 2019-06-02 NOTE — ED Notes (Signed)
Lab at bedside to collect cultures °

## 2019-06-02 NOTE — Anesthesia Procedure Notes (Signed)
Procedure Name: LMA Insertion Date/Time: 06/02/2019 9:15 PM Performed by: Justus Memory, CRNA Pre-anesthesia Checklist: Patient identified, Patient being monitored, Timeout performed, Emergency Drugs available and Suction available Patient Re-evaluated:Patient Re-evaluated prior to induction Oxygen Delivery Method: Circle system utilized Preoxygenation: Pre-oxygenation with 100% oxygen Induction Type: IV induction Ventilation: Mask ventilation without difficulty LMA: LMA inserted LMA Size: 3.5 Tube type: Oral Number of attempts: 1 Placement Confirmation: positive ETCO2 and breath sounds checked- equal and bilateral Tube secured with: Tape Dental Injury: Teeth and Oropharynx as per pre-operative assessment

## 2019-06-02 NOTE — ED Notes (Addendum)
Pt HR 200 max, Dr. Kerman Passey aware, cardizem ordered. Dr Hedwig Morton at bedside to discuss plan of care with family. Artist Pais RN at bedside

## 2019-06-02 NOTE — ED Notes (Addendum)
Dr, Blaine Hamper notified of BP [id number] systolic and HR [id number]. Per Dr Blaine Hamper, hold cardizem drip for HR less than 110.  Cardizem drip stopped d/t HR 95

## 2019-06-02 NOTE — H&P (Addendum)
History and Physical    Jamie Boyd [id number] DOB: [birth year] DOA: 06/02/2019  Referring MD/NP/PA:   PCP: Sharyne Peach, MD   Patient coming from:  The patient is coming from home.  At baseline, pt is dependent for most of ADL.        Chief Complaint: AMS  HPI: Jamie Boyd is a 69 y.o. female with medical history significant of hospice care, hyperlipidemia, stroke, GERD, indwelling Foley catheter, recurrent UTI, Parkinson's disease, dementia, iron deficiency anemia, who presents with altered mental status.  Per her niece, patient has been having decreased response in the past 3 days.  At baseline, patient is bed ridden and largely ely nonverbal. Patient was found to have oxygen desaturation to 88% on home level 2 L nasal cannula oxygen, which improved her to 90% on 4 L oxygen, later on improved to 97% on 2 L oxygen. No active cough or respiratory distress noted.  Not sure if patient has any chest pain.  No active nausea, vomiting, diarrhea noted. Patient was found to have A. fib with RVR with heart rate up to 200 -->170s.  Cardizem drip started in ED.   ED Course: pt was found to have WBC 20.0, lactic acid 1.4, troponin 14, urinalysis (hazy appearance, small amount of leukocyte, few bacteria, WBC 0-5), AKI with creatinine 3.55, BUN 139, pending COVID-19 PCR, temperature normal, soft blood pressure currently, RR 21, chest x-ray showed bilateral basilar opacity.  Patient is admitted to progressive bed as inpatient    Review of Systems: Could not reviewed due to altered mental status  Allergy:  Allergies  Allergen Reactions  . Pollen Extract Other (See Comments)    Past Medical History:  Diagnosis Date  . Alzheimer disease ([place])   . Anemia   . Dementia ([place])   . Goiter   . Memory deficit   . Parkinson's disease ([place])   . Urinary tract infection, site not specified     Past Surgical History:  Procedure Laterality Date  . COLONOSCOPY WITH PROPOFOL N/A 02/18/2015    Procedure: COLONOSCOPY WITH PROPOFOL;  Surgeon: Hulen Luster, MD;  [facility];  Service: Gastroenterology;  Laterality: N/A;  . ESOPHAGOGASTRODUODENOSCOPY N/A 02/18/2015   Procedure: ESOPHAGOGASTRODUODENOSCOPY (EGD);  Surgeon: Hulen Luster, MD;  Location: [facility] ENDOSCOPY;  Service: Gastroenterology;  Laterality: N/A;    Social History:  reports that she quit smoking about 5 years ago. Her smoking use included cigarettes. She has never used smokeless tobacco. She reports that she does not drink alcohol or use drugs.  Family History:  Family History  Problem Relation Age of Onset  . Diabetes Maternal Uncle   . Prostate cancer Neg Hx   . Bladder Cancer Neg Hx   . Kidney cancer Neg Hx      Prior to Admission medications   Medication Sig Start Date End Date Taking? Authorizing Provider  aspirin EC 81 MG tablet Take 81 mg by mouth daily.    [provider]  atorvastatin (LIPITOR) 40 MG tablet Take 40 mg by mouth daily at 6 PM.    [provider]  Azelastine HCl 0.15 % SOLN Place 1 spray into the nose daily as needed (allergies).    [provider]  Azelastine HCl 137 MCG/SPRAY SOLN Place 1 spray into both nostrils 2 (two) times daily. Use in each nostril as directed     [provider]  diclofenac (VOLTAREN) 50 MG EC tablet Take 50 mg by mouth daily.    [provider]  divalproex (DEPAKOTE) 250 MG DR tablet Take 250 mg by mouth 3 (three) times daily.    [provider]  donepezil (ARICEPT) 10 MG tablet Take 10 mg by mouth at bedtime.    [provider]  doxycycline (VIBRA-TABS) 100 MG tablet Take 100 mg by mouth 2 (two) times daily. 04/14/17   [provider]  esomeprazole (NEXIUM) 40 MG capsule Take 40 mg by mouth daily as needed (acid reflux).    [provider]  ferrous sulfate 325 (65 FE) MG EC tablet Take 325 mg by mouth daily with breakfast.    [provider]  fexofenadine (ALLEGRA) 180 MG  tablet Take 180 mg by mouth daily.    [provider]  glucosamine-chondroitin 500-400 MG tablet Take 1 tablet by mouth 3 (three) times daily.    [provider]  guaiFENesin (ROBITUSSIN) 100 MG/5ML SOLN Take 5 mLs (100 mg total) by mouth every 4 (four) hours as needed for cough or to loosen phlegm. 03/30/18   Triplett, Johnette Abraham B, FNP  memantine (NAMENDA) 10 MG tablet Take 10 mg by mouth 2 (two) times daily.  11/23/15 11/27/17  [provider]  mirabegron ER (MYRBETRIQ) 50 MG TB24 tablet Take 1 tablet (50 mg total) by mouth daily. 03/19/17   Zara Council A, PA-C  montelukast (SINGULAIR) 10 MG tablet Take 10 mg by mouth at bedtime.    [provider]  Multiple Vitamins-Minerals (CENTRUM SILVER) tablet Take 1 tablet by mouth daily.    [provider]  nicotine polacrilex (COMMIT) 4 MG lozenge Take 4 mg by mouth as needed for smoking cessation.    [provider]  omega-3 acid ethyl esters (LOVAZA) 1 g capsule Take 1 g by mouth 2 (two) times daily.    [provider]  oxyCODONE (ROXICODONE) 5 MG/5ML solution Take 2.5 mLs (2.5 mg total) by mouth every 4 (four) hours as needed for severe pain. Take 2.5 mg (2.5 mL) for severe pain or 5 mg (5 mL) for breakthrough pain. 03/07/19   Duffy Bruce, MD  phenazopyridine (PYRIDIUM) 200 MG tablet Take 200 mg by mouth 3 (three) times daily with meals.    [provider]  QUEtiapine (SEROQUEL) 100 MG tablet Take 100 mg by mouth at bedtime.    [provider]  traZODone (DESYREL) 100 MG tablet Take 100 mg by mouth at bedtime.    [provider]  vitamin E 400 UNIT capsule Take 400 Units by mouth daily.    [provider]    Physical Exam: Vitals:   06/02/19 1615 06/02/19 1630 06/02/19 1715 06/02/19 1730  BP: (!) 95/58 (!) 105/57 (!) 88/62 (!) 86/65  Pulse: 92 92 92 92  Resp: 15 16 15 12   Temp:      TempSrc:      SpO2: 99% 99% 100% 99%  Weight:      Height:         General: Not in acute distress HEENT:       Eyes: PERRL, EOMI, no scleral icterus.       ENT: No discharge from the ears and nose, no pharynx injection, no tonsillar enlargement.        Neck: No JVD, no bruit, no mass felt. Heme: No neck lymph node enlargement. Cardiac: S1/S2, RRR, No murmurs, No gallops or rubs. Respiratory: No rales, wheezing, rhonchi or rubs. GI: Soft, nondistended, nontender, no organomegaly, BS present. GU: No hematuria. Has indwelling foley cath in place Ext:  No pitting leg edema bilaterally. 2+DP/PT pulse bilaterally. Musculoskeletal: No joint deformities, No joint redness or warmth, no limitation of ROM in spin. Skin: No rashes.  Neuro: Nonverbal, not oriented X3, cranial nerves II-XII grossly intact Psych: Patient is not psychotic  Labs on Admission: I have personally reviewed following labs and imaging studies  CBC: Recent Labs  Lab 06/02/19 1040  WBC 20.0*  NEUTROABS 15.2*  HGB 9.9*  HCT 26.9*  MCV 77.5*  PLT 69*   Basic Metabolic Panel: Recent Labs  Lab 06/02/19 1040  NA 143  K 4.8  CL 111  CO2 19*  GLUCOSE 137*  BUN 139*  CREATININE 3.55*  CALCIUM 8.6*   GFR: Estimated Creatinine Clearance: 15 mL/min (A) (by C-G formula based on SCr of 3.55 mg/dL (H)). Liver Function Tests: Recent Labs  Lab 06/02/19 1040  AST 104*  ALT 94*  ALKPHOS 245*  BILITOT 8.3*  PROT 7.5  ALBUMIN 2.0*   No results for input(s): LIPASE, AMYLASE in the last 168 hours. No results for input(s): AMMONIA in the last 168 hours. Coagulation Profile: No results for input(s): INR, PROTIME in the last 168 hours. Cardiac Enzymes: No results for input(s): CKTOTAL, CKMB, CKMBINDEX, TROPONINI in the last 168 hours. BNP (last 3 results) No results for input(s): PROBNP in the last 8760 hours. HbA1C: No results for input(s): HGBA1C in the last 72 hours. CBG: No results for input(s): GLUCAP in the last 168 hours. Lipid Profile: No results for input(s): CHOL,  HDL, LDLCALC, TRIG, CHOLHDL, LDLDIRECT in the last 72 hours. Thyroid Function Tests: No results for input(s): TSH, T4TOTAL, FREET4, T3FREE, THYROIDAB in the last 72 hours. Anemia Panel: No results for input(s): VITAMINB12, FOLATE, FERRITIN, TIBC, IRON, RETICCTPCT in the last 72 hours. Urine analysis:    Component Value Date/Time   COLORURINE YELLOW 06/02/2019 1041   APPEARANCEUR HAZY (A) 06/02/2019 1041   APPEARANCEUR Clear 02/07/2017 1605   LABSPEC 1.015 06/02/2019 1041   PHURINE 7.0 06/02/2019 1041   GLUCOSEU NEGATIVE 06/02/2019 1041   HGBUR MODERATE (A) 06/02/2019 1041   BILIRUBINUR SMALL (A) 06/02/2019 1041   BILIRUBINUR Negative 02/07/2017 1605   KETONESUR NEGATIVE 06/02/2019 1041   PROTEINUR 100 (A) 06/02/2019 1041   UROBILINOGEN 0.2 12/27/2016 1322   NITRITE NEGATIVE 06/02/2019 1041   LEUKOCYTESUR SMALL (A) 06/02/2019 1041   Sepsis Labs: @LABRCNTIP (procalcitonin:4,lacticidven:4) ) Recent Results (from the past 240 hour(s))  Respiratory Panel by RT PCR (Flu A&B, Covid) - Nasopharyngeal Swab     Status: None   Collection Time: 06/02/19 11:05 AM   Specimen: Nasopharyngeal Swab  Result Value Ref Range Status   SARS Coronavirus 2 by RT PCR NEGATIVE NEGATIVE Final    Comment: (NOTE) SARS-CoV-2 target nucleic acids are NOT DETECTED. The SARS-CoV-2 RNA is generally detectable in upper respiratoy specimens during the acute phase of infection. The lowest concentration of SARS-CoV-2 viral copies this assay can detect is 131 copies/mL. A negative result does not preclude SARS-Cov-2 infection and should not be used as the sole basis for treatment or other patient management decisions. A negative result may occur with  improper specimen collection/handling, submission of specimen other than nasopharyngeal swab, presence of viral mutation(s) within the areas targeted by this assay, and inadequate number of viral copies (<131 copies/mL). A negative result must be combined with  clinical observations, patient history, and epidemiological information. The expected result is Negative. Fact Sheet for Patients:  PinkCheek.be Fact Sheet for Healthcare Providers:  GravelBags.it This test is not yet ap proved  or cleared by the [place] and  has been authorized for detection and/or diagnosis of SARS-CoV-2 by FDA under an Emergency Use Authorization (EUA). This EUA will remain  in effect (meaning this test can be used) for the duration of the COVID-19 declaration under Section 564(b)(1) of the Act, 21 U.S.C. section 360bbb-3(b)(1), unless the authorization is terminated or revoked sooner.    Influenza A by PCR NEGATIVE NEGATIVE Final   Influenza B by PCR NEGATIVE NEGATIVE Final    Comment: (NOTE) The Xpert Xpress SARS-CoV-2/FLU/RSV assay is intended as an aid in  the diagnosis of influenza from Nasopharyngeal swab specimens and  should not be used as a sole basis for treatment. Nasal washings and  aspirates are unacceptable for Xpert Xpress SARS-CoV-2/FLU/RSV  testing. Fact Sheet for Patients: PinkCheek.be Fact Sheet for Healthcare Providers: GravelBags.it This test is not yet approved or cleared by the [place] FDA and  has been authorized for detection and/or diagnosis of SARS-CoV-2 by  FDA under an Emergency Use Authorization (EUA). This EUA will remain  in effect (meaning this test can be used) for the duration of the  Covid-19 declaration under Section 564(b)(1) of the Act, 21  U.S.C. section 360bbb-3(b)(1), unless the authorization is  terminated or revoked. Performed at [facility], [address], [place], [place] [address]      Radiological Exams on Admission: CT HEAD WO CONTRAST  Result Date: 06/02/2019 CLINICAL DATA:  Dementia.  Encephalopathy.  Mental status changes. EXAM: CT HEAD WITHOUT CONTRAST TECHNIQUE:  Contiguous axial images were obtained from the base of the skull through the vertex without intravenous contrast. COMPARISON:  09/10/2017 FINDINGS: Brain: Advanced generalized brain atrophy, progressive since the previous study. Extensive chronic small-vessel ischemic changes throughout the cerebral hemispheric white matter. No sign of acute infarction, mass lesion or hemorrhage. Ventricular size is considerably increased since the previous study, but seemingly in proportion to the progressive generalized atrophy. Possibility of normal pressure hydrocephalus is not excluded however. Vascular: There is atherosclerotic calcification of the major vessels at the base of the brain. Skull: Negative Sinuses/Orbits: Clear/normal Other: None IMPRESSION: Since 2019, there has been progressive generalized brain atrophy and worsening of chronic small vessel ischemic change throughout the brain. Ventricles are larger, which I presume relates to ex vacuo enlargement from the worsened atrophy. I cannot completely rule out the possibility of normal pressure hydrocephalus. There is no acute stroke or intracranial hemorrhage evident by CT. Electronically Signed   By: Nelson Chimes M.D.   On: 06/02/2019 16:48   US RENAL  Result Date: 06/02/2019 CLINICAL DATA:  Acute kidney injury EXAM: RENAL / URINARY TRACT ULTRASOUND COMPLETE COMPARISON:  Ultrasound 10/26/2017 FINDINGS: Right Kidney: Renal measurements: 10.8 x 3.9 x 4.3 cm = volume: 97 mL . Echogenicity within normal limits. No mass or hydronephrosis visualized. Left Kidney: Renal measurements: 10.8 x 6.1 x 4.5 cm = volume: 156 mL. Cortical echogenicity within normal limits. Moderate hydronephrosis. Echogenic foci within dilated left renal pelvis, potentially stones, these measure up to 12 mm. Visualized proximal ureter is also dilated. Bladder: Foley catheter in the bladder which is empty Other: None. IMPRESSION: 1. Moderate left hydronephrosis with proximal hydroureter.  Echogenic foci within dilated left renal pelvis, probable stones. CT KUB suggested for further evaluation and to assess for distal obstruction of the left kidney 2. Normal ultrasound appearance of the right kidney Electronically Signed   By: Donavan Foil M.D.   On: 06/02/2019 17:39   DG Chest Portable 1 View  Result Date: 06/02/2019 CLINICAL DATA:  Hypoxia EXAM: PORTABLE CHEST 1 VIEW COMPARISON:  03/07/2019 FINDINGS: Low lung volumes. Bibasilar opacities. No significant pleural effusion. No pneumothorax. Stable cardiomediastinal contours. IMPRESSION: Bibasilar opacities favored to reflect atelectasis. Electronically Signed   By: Macy Mis M.D.   On: 06/02/2019 10:57     EKG: Independently reviewed.  Sinus rhythm, QTC 480, low voltage, LAE, nonspecific T wave change  Assessment/Plan Principal Problem:   Acute metabolic encephalopathy Active Problems:   GERD without esophagitis   Iron deficiency anemia due to chronic blood loss   CVA (cerebral vascular accident) ([place])   AKI (acute kidney injury) ([place])   Alzheimer disease ([place])   HLD (hyperlipidemia)   Atrial fibrillation with RVR ([facility])   Abnormal LFTs   Thrombocytopenia ([facility])   Sepsis ([facility])   Acute metabolic encephalopathy: Etiology is not clear, likely due to multifactorial etiology, including sepsis, A. fib with RVR, AKI. -Admitted to stepdown bed as inpatient -Frequent neuro check -Follow-up CT head -hold oral med and keep NPO until mental status improves  Sepsis: Patient meets criteria for sepsis with leukocytosis, WBC 20.  Tachycardia.  Soft blood pressure, blood pressure down to 86/65.  Source of infection is not clear.  Urinalysis not impressive.  Chest x-ray did not show obvious infiltration. -Started vancomycin and meropenem empirically -For blood culture and urine culture -will get Procalcitonin and trend lactic acid levels per sepsis protocol. -IVF: 2L of NS bolus in ED, followed by 75 cc/h   GERD without  esophagitis: -IV pecide  Iron deficiency anemia due to chronic blood loss -hold iron supplement until mental status improves  CVA (cerebral vascular accident) ([place]) -hold ASA and lipitor  AKI (acute kidney injury) ([place]): renal US showed moderate left hydronephrosis with proximal hydroureter. Echogenic foci within dilated left renal pelvis, probable stones. -urology, Dr. Gloriann Loan is consulted --> stat CT per renal stone study protocol per Dr. Rush Landmark -renal, Dr. Juleen China is consulted -Ibuprofen, Lasix and HCTZ on hold  Alzheimer disease [facility]): -hold home meds  HLD (hyperlipidemia) -hold lipitor  New onset Atrial fibrillation with RVR ([place]): heart rate up to 200s. Trop 14.  -start IV cardizem gtt -consulted card, Dr. Fletcher Anon (message sent to Dr. Fletcher Anon).  Abnormal LFTs -check hepatitis panel and HIV antibody -Avoid using Tylenol  Thrombocytopenia ([place]): Likely due to sepsis -Check LDH and peripheral smear    Goals of care: Patient is DNR with MOST form at the bedside.  Patient is under hospice care at home.  Both ED physician and I had extensive discussion with the family.  Per her niece, they want patient to be treated medically now, does not want to go for full comfort care at this moment.   DVT ppx: SCD Code Status: DNR code (pt has MOST form) Family Communication:  Yes, patient's niece at bed side Disposition Plan:  Anticipate discharge back to previous home environment Consults called:  none Admission status:   SDU/inpation        Status is: Inpatient Remains inpatient appropriate because:IV treatments appropriate due to intensity of illness or inability to take PO Dispo: The patient is from: Home              Anticipated d/c is to: Home              Anticipated d/c date is: 2 days              Patient currently is not medically stable to d/c.  Date of Service 06/02/2019    Ivor Costa [facility]   If 7PM-7AM, please contact  night-coverage [email or web address] 06/02/2019, 5:49 PM

## 2019-06-02 NOTE — Transfer of Care (Signed)
Immediate Anesthesia Transfer of Care Note  Patient: Jamie Boyd  Procedure(s) Performed: CYSTOSCOPY WITH STENT PLACEMENT (Left Ureter)  Patient Location: ICU  Anesthesia Type:General  Level of Consciousness: unresponsive  Airway & Oxygen Therapy: Patient remains intubated per anesthesia plan  Post-op Assessment: Report given to RN and Post -op Vital signs reviewed and stable  Post vital signs: Reviewed and stable  Last Vitals:  Vitals Value Taken Time  BP    Temp    Pulse    Resp 31 06/02/19 2224  SpO2    Vitals shown include unvalidated device data.  Last Pain:  Vitals:   06/02/19 1959  TempSrc: Axillary         Complications: No apparent anesthesia complications

## 2019-06-02 NOTE — Op Note (Signed)
Operative Note  Preoperative diagnosis:  1.  Left ureteral calculus with sepsis secondary to urinary tract infection  Post operative diagnosis: 1.  Left ureteral calculus with sepsis secondary to urinary tract infection  Procedure(s): 1.  Cystoscopy with left retrograde pyelogram and left ureteral stent placement  Surgeon: Link Snuffer, MD  Assistants: None  Anesthesia: General  Complications: None immediate  EBL: Minimal  Specimens: 1.  Urine culture  Drains/Catheters: 1.  6 X 24 double-J ureteral stent 2.  Foley catheter  Intraoperative findings: 1.  Normal urethra and bladder 2.  Left retrograde pyelogram revealed a filling defect at the level of the stone with upstream hydroureteronephrosis  Indication: 69 year old female with multiple medical comorbidities presented with 3 days of failure to thrive.  She was found to be septic.  Initial ultrasound was performed for acute renal insufficiency and sepsis.  This revealed left-sided hydronephrosis possibly secondary to a stone.  I therefore got a stat CT that confirmed a distal obstructing ureteral calculus with upstream hydronephrosis on the left.  After discussion with her family, decision was made to proceed urgently to the operating room for the above operation.  Description of procedure:  The patient was identified and consent was obtained.  The patient was taken to the operating room and placed in the supine position.  The patient was placed under general anesthesia.  Perioperative antibiotics were administered.  The patient was placed in dorsal lithotomy.  Patient was prepped and draped in a standard sterile fashion and a timeout was performed.  A 21 French rigid cystoscope was advanced into the urethra and into the bladder.  The left distal most portion of the ureter was cannulated with an open-ended ureteral catheter.  Retrograde pyelogram was performed with the findings noted above.  A sensor wire was then advanced up to  the kidney under fluoroscopic guidance.  A 6 X 24 double-J ureteral stent was advanced up to the kidney under fluoroscopic guidance.  The wire was withdrawn and fluoroscopy confirmed good proximal placement and direct visualization confirmed a good coil within the bladder.  There was a large amount of purulent efflux and I therefore obtained a urine culture.  I withdrew the scope and placed a Foley catheter.  This concluded the operation.  Patient tolerated procedure well and was stable postoperatively.  Plan: Continue ICU care and antibiotics.  She will need a definitive procedure for her stone in a couple of weeks or so when she has improved completely.

## 2019-06-02 NOTE — Progress Notes (Signed)
Pharmacy Antibiotic Note  Jamie Boyd is a 69 y.o. female admitted on 06/02/2019 with altered mental status. Patient with AKI, SCr 3.55 with last known ~ 1.0 in July 2020. Unclear if this is an acute issue vs chronic renal dysfunction. Pharmacy has been consulted for vancomycin and meropenem dosing.  Plan: Vancomycin 1750 mg IV x 1. Based on current renal function, patient likely q36-48h dosing. Will follow up renal function in the morning.  Meropenem 1 g IV q12h  Height: 5\' 2"  (157.5 cm) Weight: 86 kg (189 lb 9.5 oz) IBW/kg (Calculated) : 50.1  Temp (24hrs), Avg:98 F (36.7 C), Min:98 F (36.7 C), Max:98 F (36.7 C)  Recent Labs  Lab 06/02/19 1040 06/02/19 1204  WBC 20.0*  --   CREATININE 3.55*  --   LATICACIDVEN  --  1.4    Estimated Creatinine Clearance: 15 mL/min (A) (by C-G formula based on SCr of 3.55 mg/dL (H)).    Allergies  Allergen Reactions  . Pollen Extract Other (See Comments)    Antimicrobials this admission: Vancomycin 4/26 >> Meropenem 4/26 >>  Dose adjustments this admission: NA   Thank you for allowing pharmacy to be a part of this patient's care.  Tawnya Crook, PharmD 06/02/2019 2:30 PM

## 2019-06-02 NOTE — ED Notes (Signed)
Pharmacy contacted to send vanc and cardizem drip d/t pyxis out

## 2019-06-02 NOTE — Consult Note (Addendum)
Name: Jamie Boyd MRN: [id number] DOB: 1950/10/04    ADMISSION DATE:  06/02/2019 CONSULTATION DATE: 06/02/2019  REFERRING MD : Dr. Gloriann Loan   CHIEF COMPLAINT: Altered Mental Status   BRIEF PATIENT DESCRIPTION:  69 yo female with alzheimer's dementia admitted with sepsis, acute renal failure, and acute encephalopathy  secondary to UTI in the setting of obstructing left ureteral calculus s/p cystoscopy and left ureteral stent placement remained mechanically intubated postop.  Noted to have acute GI bleed   SIGNIFICANT EVENTS/STUDIES:  04/26: Pt admitted to the stepdown unit with sepsis and acute metabolic encephalopathy  51/76: CT Renal Stone Study revealed there is moderate/severe left hydronephrosis and ureterectasis secondary to a 7 mm stone in the distal left ureter. Small bilateral pleural effusions. Scattered bandlike opacities in the bilateral lungs favored represent atelectasis. There is a more focal small opacity in the peripheral left lung which could reflect atelectasis though infection not excluded. Moderate wall thickening in the rectum which is nonspecific but could reflect chronic inflammation/proctitis. Colonoscopy should be considered when/if clinically appropriate. Multiple small lucencies throughout the right iliac bone, favored to represent intra osseous pneumatocysts, a clinically insignificant finding. Mild new wedge compression fracture at L1. No significant Listhesis. 04/26: Due to CT findings urology consulted and pt underwent emergent cystoscopy with left retrograde pyelogram and left ureteral stent placement.  She remained intubated postop PCCM team consulted for vent management 04/26: Pt noted to have watery maroon colored stool postop (according to pts niece this is new for the pt)  HISTORY OF PRESENT ILLNESS:  This is a 69 yo female with a PMH of Recurrent UTI, Parkinson's Disease, Memory Deficit, Alzheimer's Dimentia (nonverbal and bedridden at baseline), CVA, GERD,  Chronic O2 _0 , Anemia, and Goiter. She presented to [facility] ER via EMS on 04/26 from home with decreased responsiveness.  Per ER notes pt awake, but unable to follow commands in the ER.  In the ER pts O2 sats were 88% on 2L, therefore O2 increased to 4L with O2 sats increasing to the mid 90's.  Initial ER EKG revealed normal sinus rhythm and no ST changes.  However, pt later developed atrial fibrillation with rvr hr 170's-200's requiring cardizem gtt. COVID-10/Influenza PCR negative, CXR concerning for atelectasis vs. bilateral opacities.  UA + for UTI and pt ruled in for sepsis.  She received vancomycin, meropenum, and 2L NS bolus.  She was subsequently admitted to the stepdown unit per hospitalist team for additional workup and treatment.  Detailed hospital course outlined above under significant events.  PAST MEDICAL HISTORY :   has a past medical history of Alzheimer disease ([place]), Anemia, Dementia ([place]), Goiter, Memory deficit, Parkinson's disease ([place]), and Urinary tract infection, site not specified.  has a past surgical history that includes Colonoscopy with propofol (N/A, 02/18/2015) and Esophagogastroduodenoscopy (N/A, 02/18/2015). Prior to Admission medications   Medication Sig Start Date End Date Taking? Authorizing Provider  Acetaminophen 500 MG capsule Take 1,000 mg by mouth every 12 (twelve) hours as needed for pain.    Yes [provider]  albuterol (PROVENTIL) (2.5 MG/3ML) 0.083% nebulizer solution Take 2.5 mg by nebulization every 4 (four) hours as needed for wheezing or shortness of breath.   Yes [provider]  aspirin EC 81 MG tablet Take 81 mg by mouth daily.   Yes [provider]  bisacodyl (DULCOLAX) 10 MG suppository Place 10 mg rectally daily as needed for moderate constipation.   Yes [provider]  divalproex (DEPAKOTE SPRINKLE) 125 MG capsule  Take 125 mg by mouth 3 (three) times daily.    Yes [provider]  fexofenadine (ALLEGRA)  180 MG tablet Take 180 mg by mouth daily.   Yes [provider]  furosemide (LASIX) 20 MG tablet Take by mouth. 06/02/19 06/01/20 Yes [provider]  hydrochlorothiazide (MICROZIDE) 12.5 MG capsule TAKE 1 CAPSULE BY MOUTH ONCE DAILY 08/27/18  Yes [provider]  ibuprofen (ADVIL) 400 MG tablet Take 400 mg by mouth 3 (three) times daily as needed for fever or mild pain.    Yes [provider]  LORazepam (ATIVAN) 0.5 MG tablet Take 0.5 mg by mouth every 4 (four) hours as needed (anxiety, agitation, restlessness or nausea).    Yes [provider]  montelukast (SINGULAIR) 10 MG tablet Take 10 mg by mouth at bedtime.   Yes [provider]  Morphine Sulfate (MORPHINE CONCENTRATE) 10 mg / 0.5 ml concentrated solution Take 5 mg by mouth every hour as needed for severe pain or shortness of breath.    Yes [provider]  nystatin (NYSTATIN) powder Apply 1 application topically 2 (two) times daily as needed (skin irritation).   Yes [provider]  omeprazole (PRILOSEC) 20 MG capsule Take 20 mg by mouth daily.    Yes [provider]  oxybutynin (DITROPAN) 5 MG tablet Take 5 mg by mouth 2 (two) times daily.   Yes [provider]  oxyCODONE (OXY IR/ROXICODONE) 5 MG immediate release tablet Take 2.5-5 mg by mouth every 4 (four) hours as needed for severe pain or breakthrough pain.   Yes [provider]  phenylephrine-shark liver oil-mineral oil-petrolatum (PREPARATION H) 0.25-3-14-71.9 % rectal ointment Place 1 application rectally 4 (four) times daily as needed for hemorrhoids.   Yes [provider]  QUEtiapine (SEROQUEL) 100 MG tablet Take 100 mg by mouth at bedtime.   Yes [provider]  QUEtiapine (SEROQUEL) 25 MG tablet Take 25 mg by mouth daily as needed (awaking overnight).   Yes [provider]  senna-docusate (SENOKOT-S) 8.6-50 MG tablet Take 1-3 tablets by mouth 3 (three) times  daily as needed for mild constipation.   Yes [provider]  sennosides-docusate sodium (SENOKOT-S) 8.6-50 MG tablet Take 2 tablets by mouth daily.   Yes [provider]  traZODone (DESYREL) 100 MG tablet Take 100 mg by mouth at bedtime.   Yes [provider]  witch hazel-glycerin (TUCKS) pad Apply 1 application topically as needed for itching or irritation.   Yes [provider]  memantine (NAMENDA) 10 MG tablet Take 10 mg by mouth 2 (two) times daily.  11/23/15 11/27/17  [provider]   Allergies  Allergen Reactions   Pollen Extract Other (See Comments)    FAMILY HISTORY:  family history includes Diabetes in her maternal uncle. SOCIAL HISTORY:  reports that she quit smoking about 5 years ago. Her smoking use included cigarettes. She has never used smokeless tobacco. She reports that she does not drink alcohol or use drugs.  REVIEW OF SYSTEMS:   Unable to assess pt mechanically intubated   SUBJECTIVE:  Unable to assess pt mechanically intubated   VITAL SIGNS: Temp:  [97.6 F (36.4 C)-98 F (36.7 C)] 97.6 F (36.4 C) (04/26 1959) Pulse Rate:  [61-177] 84 (04/26 2100) Resp:  [12-29] 12 (04/26 2100) BP: (78-199)/(57-163) 107/65 (04/26 2100) SpO2:  [96 %-100 %] 100 % (04/26 2220) FiO2 (%):  [50 %] 50 % (04/26 2220) Weight:  [77.2 kg-86 kg] 77.2 kg (04/26 1836)  PHYSICAL EXAMINATION: General: chronically ill appearing elderly female, NAD mechanically intubated  Neuro: sedated, opens eyes spontaneously, not following commands, PERRL HEENT: supple, no JVD  Cardiovascular: nsr, rrr, no R/G Lungs: diminished throughout, even, non labored  Abdomen: hypoactive BS x4, obese, soft, slightly distended  Musculoskeletal: normal bulk and tone, no edema  Skin: intact no rashes or lesions   Recent Labs  Lab 06/02/19 1040  NA 143  K 4.8  CL 111  CO2 19*  BUN 139*  CREATININE 3.55*  GLUCOSE 137*   Recent Labs  Lab 06/02/19 1040    HGB 9.9*  HCT 26.9*  WBC 20.0*  PLT 69*   CT HEAD WO CONTRAST  Result Date: 06/02/2019 CLINICAL DATA:  Dementia.  Encephalopathy.  Mental status changes. EXAM: CT HEAD WITHOUT CONTRAST TECHNIQUE: Contiguous axial images were obtained from the base of the skull through the vertex without intravenous contrast. COMPARISON:  09/10/2017 FINDINGS: Brain: Advanced generalized brain atrophy, progressive since the previous study. Extensive chronic small-vessel ischemic changes throughout the cerebral hemispheric white matter. No sign of acute infarction, mass lesion or hemorrhage. Ventricular size is considerably increased since the previous study, but seemingly in proportion to the progressive generalized atrophy. Possibility of normal pressure hydrocephalus is not excluded however. Vascular: There is atherosclerotic calcification of the major vessels at the base of the brain. Skull: Negative Sinuses/Orbits: Clear/normal Other: None IMPRESSION: Since 2019, there has been progressive generalized brain atrophy and worsening of chronic small vessel ischemic change throughout the brain. Ventricles are larger, which I presume relates to ex vacuo enlargement from the worsened atrophy. I cannot completely rule out the possibility of normal pressure hydrocephalus. There is no acute stroke or intracranial hemorrhage evident by CT. Electronically Signed   By: Nelson Chimes M.D.   On: 06/02/2019 16:48   US RENAL  Result Date: 06/02/2019 CLINICAL DATA:  Acute kidney injury EXAM: RENAL / URINARY TRACT ULTRASOUND COMPLETE COMPARISON:  Ultrasound 10/26/2017 FINDINGS: Right Kidney: Renal measurements: 10.8 x 3.9 x 4.3 cm = volume: 97 mL . Echogenicity within normal limits. No mass or hydronephrosis visualized. Left Kidney: Renal measurements: 10.8 x 6.1 x 4.5 cm = volume: 156 mL. Cortical echogenicity within normal limits. Moderate hydronephrosis. Echogenic foci within dilated left renal pelvis, potentially stones, these  measure up to 12 mm. Visualized proximal ureter is also dilated. Bladder: Foley catheter in the bladder which is empty Other: None. IMPRESSION: 1. Moderate left hydronephrosis with proximal hydroureter. Echogenic foci within dilated left renal pelvis, probable stones. CT KUB suggested for further evaluation and to assess for distal obstruction of the left kidney 2. Normal ultrasound appearance of the right kidney Electronically Signed   By: Donavan Foil M.D.   On: 06/02/2019 17:39   DG Chest Portable 1 View  Result Date: 06/02/2019 CLINICAL DATA:  Hypoxia EXAM: PORTABLE CHEST 1 VIEW COMPARISON:  03/07/2019 FINDINGS: Low lung volumes. Bibasilar opacities. No significant pleural effusion. No pneumothorax. Stable cardiomediastinal contours. IMPRESSION: Bibasilar opacities favored to reflect atelectasis. Electronically Signed   By: Macy Mis M.D.   On: 06/02/2019 10:57   CT RENAL STONE STUDY  Result Date: 06/02/2019 CLINICAL DATA:  Altered mental status. Oxygen desaturation. Recurrent UTI. Indwelling Foley catheter. EXAM: CT ABDOMEN AND PELVIS WITHOUT CONTRAST TECHNIQUE: Multidetector CT imaging of the abdomen and pelvis was performed following the standard protocol without IV contrast. COMPARISON:  CT abdomen pelvis 04/04/2017 FINDINGS: Lower chest: Small bilateral pleural effusions. There are scattered bandlike opacities in the bilateral lungs favored represent atelectasis.  There is a more focal small opacity in the peripheral left lung (series 6, image 6) which could reflect atelectasis though infection not excluded. Hepatobiliary: No focal liver abnormality is seen. Unremarkable gallbladder. Pancreas: Unremarkable. No surrounding inflammatory changes. Spleen: Normal in size without focal abnormality. Adrenals/Urinary Tract: Adrenal glands are unremarkable. The left kidney is enlarged. There is moderate/severe left hydronephrosis and ureterectasis secondary to a stone in the distal left ureter measuring  7 mm. The right kidney is unremarkable. The urinary bladder is decompressed with a Foley catheter in place. Stomach/Bowel: Stomach is within normal limits. Appendix appears normal. There is moderate wall thickening in the rectum which is nonspecific but could reflect chronic inflammation. No evidence of obstruction. There are scattered colonic diverticula without evidence of diverticulitis. Vascular/Lymphatic: Moderate atherosclerotic disease of the abdominal aorta without aneurysm. Vascular patency cannot be assessed in the absence IV contrast. No lymphadenopathy. Reproductive: No adnexal masses. Other: No abdominal wall hernia or abnormality. No abdominopelvic ascites. Musculoskeletal: There is a mild new wedge compression fracture at L1. No significant listhesis. Multilevel degenerative disc disease throughout the lumbar spine. There are multiple lucencies throughout the right iliac bone. IMPRESSION: 1. There is moderate/severe left hydronephrosis and ureterectasis secondary to a 7 mm stone in the distal left ureter. 2. Small bilateral pleural effusions. Scattered bandlike opacities in the bilateral lungs favored represent atelectasis. There is a more focal small opacity in the peripheral left lung which could reflect atelectasis though infection not excluded. 3. Moderate wall thickening in the rectum which is nonspecific but could reflect chronic inflammation/proctitis. Colonoscopy should be considered when/if clinically appropriate. 4. Multiple small lucencies throughout the right iliac bone, favored to represent intra osseous pneumatocysts, a clinically insignificant finding. 5. Mild new wedge compression fracture at L1. No significant listhesis. Aortic Atherosclerosis (ICD10-I70.0). Electronically Signed   By: Audie Pinto M.D.   On: 06/02/2019 20:48    ASSESSMENT / PLAN:  Mechanical intubation for airway protection during cystoscopy/left ureteral stent placement  Full vent support for now-vent  settings reviewed and established  SBT once all parameters met  VAP bundled implemented   New onset atrial fibrillation with RVR  Continuous telemetry monitoring  Prn cardizem gtt for heart rate control  Prn neo-synephrine to maintain map >65 Cardiology consulted per hospitalist team   Acute renal failure secondary to left hydronephrosis in the setting of obstructing calculus and sepsis  Trend BMP  Replace electrolytes as indicated  Monitor UOP  Avoid nephrotoxic medications  Continue NS _0  ml/hr  Nephrology consulted per hospitalist team   Sepsis secondary to UTI due to obstructing left calculus s/p cystoscopy with left retrograde pyelogram and left ureteral stent placement 06/02/19 Trend WBC and monitor fever curve  Trend PCT  Follow cultures  Cdiff screen with pcr reflex pending  Continue meropenum, consider discontinuing vancomycin  Urology consulted appreciate input   Acute blood loss anemia secondary to acute GI bleed Thrombocytopenia and elevated LFT's  Trend CBC, hepatic panel, and coags  Monitor for s/sx of bleeding and transfuse for hbg <7 Occult stool results pending   GERD IV pepcid   Hyperglycemia  CBG's q4hrs  SSI   Acute encephalopathy secondary to sepsis complicated by Alzheimer's Dementia  Mechanical intubation pain/discomfort  Maintain RASS goal 0 to -1 Propofol gtt and prn versed/fentanly to maintain RASS goal  WUA daily  Frequent reorientation/pts niece at bedside to assist with this  Best Practice: VTE px: SCD's, avoid cheical prophylaxis  SUP px: iv pepcid  Diet: keep NPO  for now   -Updated pts niece regarding current plan of care and all questions were answered.  Per ER notes pts family would like pt medically managed over the next few days to see how she does. Pts code status currently DNR.   Marda Stalker, [place] Pager [phone] (please enter 7 digits) [place] Pager [phone] (please enter 7 digits)     PCCM ATTENDING ATTESTATION:  I have evaluated patient with the APP, I personally  reviewed database in its entirety and discussed care plan in detail. In addition, this patient was discussed on multidisciplinary rounds.   I agree with assessment and plan.  Important exam findings: On vent   Severe ACUTE Hypoxic and Hypercapnic Respiratory Failure POST op sepsis from Obstructed and infected Ureteral stone -continue Mechanical Ventilator support -continue Bronchodilator Therapy -Wean Fio2 and PEEP as tolerated -VAP/VENT bundle implementation -will perform SAT/SBT when respiratory parameters are met  Septic shock/SEVERE SEPSIS -use vasopressors to keep MAP>65 -follow ABG and LA -follow up cultures -emperic ABX -consider stress dose steroids -aggressive IV fluid Resuscitation  INFECTIOUS DISEASE -continue antibiotics as prescribed -follow up cultures Sepsis secondary to UTI due to obstructing left calculus s/p cystoscopy with left retrograde pyelogram and left ureteral stent placement 06/02/19  Acute blood loss anemia secondary to acute GI bleed Thrombocytopenia and elevated LFT's  GI CONSULTATION pending   Critical Care Time devoted to patient care services described in this note is 35 minutes.   Overall, patient is critically ill, prognosis is guarded.  Patient with Multiorgan failure and at high risk for cardiac arrest and death.   BASELINE FUNCTION IS POOR PATIENT IS DNR, HOSPICE PATIENT, recommend palliative care consultation  Jibreel Fedewa Patricia Pesa, M.D.  [facility]monary [facility] Critical [facility]  Medical Director [place] Director [facility] Cardio-Pulmonary Department

## 2019-06-02 NOTE — Consult Note (Signed)
[facility]  CONSULT NOTE    Date: 06/02/2019                  Patient Name:  Jamie Boyd  MRN: [id number]  DOB: 12/13/1950  Age / Sex: 69 y.o., female         PCP: Sharyne Peach, MD                 Service Requesting Consult: Dr. Blaine Hamper                 Reason for Consult: Acute renal failure            History of Present Illness: Jamie Boyd is coming from home. Patient with baseline dementia and is unable to give history. Niece who is at bedside assists with history taking. Patient has not been able to eat for the last 3 days. She was found to be in altered mental status. Brought to the ED where she was found to be in atrial fibrillation with rapid ventricular response. She was placed on diltiazem.    Medications: Outpatient medications: (Not in a hospital admission)   Current medications: Current Facility-Administered Medications  Medication Dose Route Frequency Provider Last Rate Last Admin  . 0.9 %  sodium chloride infusion   Intravenous Continuous Ivor Costa, MD      . diltiazem (CARDIZEM) 125 mg in dextrose 5% 125 mL (1 mg/mL) infusion  5-15 mg/hr Intravenous Titrated Ivor Costa, MD   Stopped at 06/02/19 1522  . meropenem (MERREM) 1 g in sodium chloride 0.9 % 100 mL IVPB  1 g Intravenous Q12H Paduchowski, Lennette Bihari, MD      . ondansetron [facility]) injection 4 mg  4 mg Intravenous Q8H PRN Ivor Costa, MD      . vancomycin (VANCOREADY) IVPB 1750 mg/350 mL  1,750 mg Intravenous Once Harvest Dark, MD 175 mL/hr at 06/02/19 1408 1,750 mg at 06/02/19 1408  . vancomycin variable dose per unstable renal function (pharmacist dosing)   Does not apply See admin instructions Harvest Dark, MD       Current Outpatient Medications  Medication Sig Dispense Refill  . Acetaminophen 500 MG capsule Take 1,000 mg by mouth every 12 (twelve) hours as needed for pain.     Marland Kitchen albuterol (PROVENTIL) (2.5 MG/3ML) 0.083% nebulizer solution Take 2.5 mg by nebulization  every 4 (four) hours as needed for wheezing or shortness of breath.    Marland Kitchen aspirin EC 81 MG tablet Take 81 mg by mouth daily.    . bisacodyl (DULCOLAX) 10 MG suppository Place 10 mg rectally daily as needed for moderate constipation.    . divalproex (DEPAKOTE SPRINKLE) 125 MG capsule Take 125 mg by mouth 3 (three) times daily.     . fexofenadine (ALLEGRA) 180 MG tablet Take 180 mg by mouth daily.    . furosemide (LASIX) 20 MG tablet Take by mouth.    . hydrochlorothiazide (MICROZIDE) 12.5 MG capsule TAKE 1 CAPSULE BY MOUTH ONCE DAILY    . ibuprofen (ADVIL) 400 MG tablet Take 400 mg by mouth 3 (three) times daily as needed for fever or mild pain.     Marland Kitchen LORazepam (ATIVAN) 0.5 MG tablet Take 0.5 mg by mouth every 4 (four) hours as needed (anxiety, agitation, restlessness or nausea).     . montelukast (SINGULAIR) 10 MG tablet Take 10 mg by mouth at bedtime.    . Morphine Sulfate (MORPHINE CONCENTRATE) 10 mg / 0.5 ml concentrated solution Take  5 mg by mouth every hour as needed for severe pain or shortness of breath.     . nystatin (NYSTATIN) powder Apply 1 application topically 2 (two) times daily as needed (skin irritation).    Marland Kitchen omeprazole (PRILOSEC) 20 MG capsule Take 20 mg by mouth daily.     Marland Kitchen oxybutynin (DITROPAN) 5 MG tablet Take 5 mg by mouth 2 (two) times daily.    Marland Kitchen oxyCODONE (OXY IR/ROXICODONE) 5 MG immediate release tablet Take 2.5-5 mg by mouth every 4 (four) hours as needed for severe pain or breakthrough pain.    . phenylephrine-shark liver oil-mineral oil-petrolatum (PREPARATION H) 0.25-3-14-71.9 % rectal ointment Place 1 application rectally 4 (four) times daily as needed for hemorrhoids.    Marland Kitchen QUEtiapine (SEROQUEL) 100 MG tablet Take 100 mg by mouth at bedtime.    Marland Kitchen QUEtiapine (SEROQUEL) 25 MG tablet Take 25 mg by mouth daily as needed (awaking overnight).    . senna-docusate (SENOKOT-S) 8.6-50 MG tablet Take 1-3 tablets by mouth 3 (three) times daily as needed for mild constipation.     . sennosides-docusate sodium (SENOKOT-S) 8.6-50 MG tablet Take 2 tablets by mouth daily.    . traZODone (DESYREL) 100 MG tablet Take 100 mg by mouth at bedtime.    Marland Kitchen witch hazel-glycerin (TUCKS) pad Apply 1 application topically as needed for itching or irritation.    . memantine (NAMENDA) 10 MG tablet Take 10 mg by mouth 2 (two) times daily.         Allergies: Allergies  Allergen Reactions  . Pollen Extract Other (See Comments)      Past Medical History: Past Medical History:  Diagnosis Date  . Alzheimer disease ([place])   . Anemia   . Dementia ([place])   . Goiter   . Memory deficit   . Parkinson's disease ([place])   . Urinary tract infection, site not specified      Past Surgical History: Past Surgical History:  Procedure Laterality Date  . COLONOSCOPY WITH PROPOFOL N/A 02/18/2015   Procedure: COLONOSCOPY WITH PROPOFOL;  Surgeon: Hulen Luster, MD;  Location[facility] ENDOSCOPY;  Service: Gastroenterology;  Laterality: N/A;  . ESOPHAGOGASTRODUODENOSCOPY N/A 02/18/2015   Procedure: ESOPHAGOGASTRODUODENOSCOPY (EGD);  Surgeon: Hulen Luster, MD;  Location: [facility] ENDOSCOPY;  Service: Gastroenterology;  Laterality: N/A;     Family History: Family History  Problem Relation Age of Onset  . Diabetes Maternal Uncle   . Prostate cancer Neg Hx   . Bladder Cancer Neg Hx   . Kidney cancer Neg Hx      Social History: Social History   Socioeconomic History  . Marital status: Divorced    Spouse name: Not on file  . Number of children: Not on file  . Years of education: Not on file  . Highest education level: Not on file  Occupational History  . Not on file  Tobacco Use  . Smoking status: Former Smoker    Types: Cigarettes    Quit date: 01/25/2014    Years since quitting: 5.3  . Smokeless tobacco: Never Used  Substance and Sexual Activity  . Alcohol use: No  . Drug use: No  . Sexual activity: Not Currently  Other Topics Concern  . Not on file  Social History Narrative   Lives at  home with family. Ambulates with minimal assistance at baseline       Social Determinants of Health   Financial Resource Strain:   . Difficulty of Paying Living Expenses:   Food Insecurity:   .  Worried About Charity fundraiser in the Last Year:   . Arboriculturist in the Last Year:   Transportation Needs:   . Film/video editor (Medical):   Marland Kitchen Lack of Transportation (Non-Medical):   Physical Activity:   . Days of Exercise per Week:   . Minutes of Exercise per Session:   Stress:   . Feeling of Stress :   Social Connections:   . Frequency of Communication with Friends and Family:   . Frequency of Social Gatherings with Friends and Family:   . Attends Religious Services:   . Active Member of Clubs or Organizations:   . Attends Archivist Meetings:   Marland Kitchen Marital Status:   Intimate Partner Violence:   . Fear of Current or Ex-Partner:   . Emotionally Abused:   Marland Kitchen Physically Abused:   . Sexually Abused:      Review of Systems: Review of Systems  Unable to perform ROS: Dementia    Vital Signs: Blood pressure 98/66, pulse 100, temperature 98 F (36.7 C), temperature source Axillary, resp. rate (!) 23, height 5\' 2"  (1.575 m), weight 86 kg, SpO2 96 %.  Weight trends: Filed Weights   06/02/19 1042  Weight: 86 kg    Physical Exam: General: NAD, laying in bed  Head: Dry oral mucosal membranes  Eyes: Anicteric, PERRL  Neck: Supple, trachea midline  Lungs:  Clear to auscultation  Heart: Regular rate and rhythm  Abdomen:  Soft, nontender,   Extremities:  no peripheral edema.  Neurologic: Not answering questions and not cooperative.   Skin: No lesions         Lab results: Basic Metabolic Panel: Recent Labs  Lab 06/02/19 1040  NA 143  K 4.8  CL 111  CO2 19*  GLUCOSE 137*  BUN 139*  CREATININE 3.55*  CALCIUM 8.6*    Liver Function Tests: Recent Labs  Lab 06/02/19 1040  AST 104*  ALT 94*  ALKPHOS 245*  BILITOT 8.3*  PROT 7.5  ALBUMIN 2.0*    No results for input(s): LIPASE, AMYLASE in the last 168 hours. No results for input(s): AMMONIA in the last 168 hours.  CBC: Recent Labs  Lab 06/02/19 1040  WBC 20.0*  NEUTROABS 15.2*  HGB 9.9*  HCT 26.9*  MCV 77.5*  PLT 69*    Cardiac Enzymes: No results for input(s): CKTOTAL, CKMB, CKMBINDEX, TROPONINI in the last 168 hours.  BNP: Invalid input(s): POCBNP  CBG: No results for input(s): GLUCAP in the last 168 hours.  Microbiology: Results for orders placed or performed during the hospital encounter of 06/02/19  Respiratory Panel by RT PCR (Flu A&B, Covid) - Nasopharyngeal Swab     Status: None   Collection Time: 06/02/19 11:05 AM   Specimen: Nasopharyngeal Swab  Result Value Ref Range Status   SARS Coronavirus 2 by RT PCR NEGATIVE NEGATIVE Final    Comment: (NOTE) SARS-CoV-2 target nucleic acids are NOT DETECTED. The SARS-CoV-2 RNA is generally detectable in upper respiratoy specimens during the acute phase of infection. The lowest concentration of SARS-CoV-2 viral copies this assay can detect is 131 copies/mL. A negative result does not preclude SARS-Cov-2 infection and should not be used as the sole basis for treatment or other patient management decisions. A negative result may occur with  improper specimen collection/handling, submission of specimen other than nasopharyngeal swab, presence of viral mutation(s) within the areas targeted by this assay, and inadequate number of viral copies (<131 copies/mL). A negative result must  be combined with clinical observations, patient history, and epidemiological information. The expected result is Negative. Fact Sheet for Patients:  PinkCheek.be Fact Sheet for Healthcare Providers:  GravelBags.it This test is not yet ap proved or cleared by the [place] FDA and  has been authorized for detection and/or diagnosis of SARS-CoV-2 by FDA under an Emergency  Use Authorization (EUA). This EUA will remain  in effect (meaning this test can be used) for the duration of the COVID-19 declaration under Section 564(b)(1) of the Act, 21 U.S.C. section 360bbb-3(b)(1), unless the authorization is terminated or revoked sooner.    Influenza A by PCR NEGATIVE NEGATIVE Final   Influenza B by PCR NEGATIVE NEGATIVE Final    Comment: (NOTE) The Xpert Xpress SARS-CoV-2/FLU/RSV assay is intended as an aid in  the diagnosis of influenza from Nasopharyngeal swab specimens and  should not be used as a sole basis for treatment. Nasal washings and  aspirates are unacceptable for Xpert Xpress SARS-CoV-2/FLU/RSV  testing. Fact Sheet for Patients: PinkCheek.be Fact Sheet for Healthcare Providers: GravelBags.it This test is not yet approved or cleared by the [place] FDA and  has been authorized for detection and/or diagnosis of SARS-CoV-2 by  FDA under an Emergency Use Authorization (EUA). This EUA will remain  in effect (meaning this test can be used) for the duration of the  Covid-19 declaration under Section 564(b)(1) of the Act, 21  U.S.C. section 360bbb-3(b)(1), unless the authorization is  terminated or revoked. Performed at [facility], [address] [place], [place] [address]     Coagulation Studies: No results for input(s): LABPROT, INR in the last 72 hours.  Urinalysis: Recent Labs    06/02/19 1041  COLORURINE YELLOW  LABSPEC 1.015  PHURINE 7.0  GLUCOSEU NEGATIVE  HGBUR MODERATE*  BILIRUBINUR SMALL*  KETONESUR NEGATIVE  PROTEINUR 100*  NITRITE NEGATIVE  LEUKOCYTESUR SMALL*      Imaging: DG Chest Portable 1 View  Result Date: 06/02/2019 CLINICAL DATA:  Hypoxia EXAM: PORTABLE CHEST 1 VIEW COMPARISON:  03/07/2019 FINDINGS: Low lung volumes. Bibasilar opacities. No significant pleural effusion. No pneumothorax. Stable cardiomediastinal contours. IMPRESSION:  Bibasilar opacities favored to reflect atelectasis. Electronically Signed   By: Macy Mis M.D.   On: 06/02/2019 10:57      Assessment & Plan: Jamie Boyd is a 69 y.o. black female with Alzheimer's dementia, Parkinson's, who is followed by Hospice , who was admitted to [facility] on 06/02/2019 for Atrial fibrillation with RVR ([place]) [I48.91]  1. Acute renal failure: baseline creatinine of 1, GFR > 60 on 0000000.  2. Metabolic acidosis 3. Anemia with renal failure 4. Thrombocytopenia 5. Atrial fibrillation  Acute renal failure secondary to prerenal azotemia No acute indication for dialysis. Not a candidate for long term dialysis.  Hold hydrochlorothiazide and ibuprofen.   LOS: 0 Harold Moncus 4/26/20213:46 PM

## 2019-06-02 NOTE — Consult Note (Signed)
H&P Physician requesting consult: Ivor Costa  Chief Complaint: Left hydronephrosis, sepsis  History of Present Illness: 69 year old female with multiple medical comorbidities including a patient in hospice care, history of stroke, history of recurrent urinary tract infections, Parkinson's disease, dementia.  She presented with a 3-day history of failure to thrive.  She has not been eating and has been less verbal.  She was found to have acute renal insufficiency with a creatinine of 3.55.  She has leukocytosis with white blood cell count 20.  She is currently on meropenem and vancomycin.  She underwent a renal ultrasound that revealed left-sided hydronephrosis.  She therefore underwent a CT renal stone protocol that revealed left hydronephrosis down to the level of a distal ureteral calculus.  Past Medical History:  Diagnosis Date  . Alzheimer disease ([place])   . Anemia   . Dementia ([place])   . Goiter   . Memory deficit   . Parkinson's disease ([place])   . Urinary tract infection, site not specified    Past Surgical History:  Procedure Laterality Date  . COLONOSCOPY WITH PROPOFOL N/A 02/18/2015   Procedure: COLONOSCOPY WITH PROPOFOL;  Surgeon: Hulen Luster, MD;  Location: [facility] ENDOSCOPY;  Service: Gastroenterology;  Laterality: N/A;  . ESOPHAGOGASTRODUODENOSCOPY N/A 02/18/2015   Procedure: ESOPHAGOGASTRODUODENOSCOPY (EGD);  Surgeon: Hulen Luster, MD;  Location: [facility] ENDOSCOPY;  Service: Gastroenterology;  Laterality: N/A;    Home Medications:  Medications Prior to Admission  Medication Sig Dispense Refill Last Dose  . Acetaminophen 500 MG capsule Take 1,000 mg by mouth every 12 (twelve) hours as needed for pain.    Unknown at PRN  . albuterol (PROVENTIL) (2.5 MG/3ML) 0.083% nebulizer solution Take 2.5 mg by nebulization every 4 (four) hours as needed for wheezing or shortness of breath.   Unknown at PRN  . aspirin EC 81 MG tablet Take 81 mg by mouth daily.     . bisacodyl (DULCOLAX) 10 MG suppository  Place 10 mg rectally daily as needed for moderate constipation.   Unknown at PRN  . divalproex (DEPAKOTE SPRINKLE) 125 MG capsule Take 125 mg by mouth 3 (three) times daily.      . fexofenadine (ALLEGRA) 180 MG tablet Take 180 mg by mouth daily.     . furosemide (LASIX) 20 MG tablet Take by mouth.     . hydrochlorothiazide (MICROZIDE) 12.5 MG capsule TAKE 1 CAPSULE BY MOUTH ONCE DAILY     . ibuprofen (ADVIL) 400 MG tablet Take 400 mg by mouth 3 (three) times daily as needed for fever or mild pain.    Unknown at PRN  . LORazepam (ATIVAN) 0.5 MG tablet Take 0.5 mg by mouth every 4 (four) hours as needed (anxiety, agitation, restlessness or nausea).    Unknown at PRN  . montelukast (SINGULAIR) 10 MG tablet Take 10 mg by mouth at bedtime.     . Morphine Sulfate (MORPHINE CONCENTRATE) 10 mg / 0.5 ml concentrated solution Take 5 mg by mouth every hour as needed for severe pain or shortness of breath.    Unknown at PRN  . nystatin (NYSTATIN) powder Apply 1 application topically 2 (two) times daily as needed (skin irritation).   Unknown at PRN  . omeprazole (PRILOSEC) 20 MG capsule Take 20 mg by mouth daily.      Marland Kitchen oxybutynin (DITROPAN) 5 MG tablet Take 5 mg by mouth 2 (two) times daily.     Marland Kitchen oxyCODONE (OXY IR/ROXICODONE) 5 MG immediate release tablet Take 2.5-5 mg by mouth every  4 (four) hours as needed for severe pain or breakthrough pain.   Unknown at PRN  . phenylephrine-shark liver oil-mineral oil-petrolatum (PREPARATION H) 0.25-3-14-71.9 % rectal ointment Place 1 application rectally 4 (four) times daily as needed for hemorrhoids.   Unknown at PRN  . QUEtiapine (SEROQUEL) 100 MG tablet Take 100 mg by mouth at bedtime.     Marland Kitchen QUEtiapine (SEROQUEL) 25 MG tablet Take 25 mg by mouth daily as needed (awaking overnight).   Unknown at PRN  . senna-docusate (SENOKOT-S) 8.6-50 MG tablet Take 1-3 tablets by mouth 3 (three) times daily as needed for mild constipation.   Unknown at PRN  . sennosides-docusate  sodium (SENOKOT-S) 8.6-50 MG tablet Take 2 tablets by mouth daily.     . traZODone (DESYREL) 100 MG tablet Take 100 mg by mouth at bedtime.     Marland Kitchen witch hazel-glycerin (TUCKS) pad Apply 1 application topically as needed for itching or irritation.   Unknown at PRN  . memantine (NAMENDA) 10 MG tablet Take 10 mg by mouth 2 (two) times daily.       Allergies:  Allergies  Allergen Reactions  . Pollen Extract Other (See Comments)    Family History  Problem Relation Age of Onset  . Diabetes Maternal Uncle   . Prostate cancer Neg Hx   . Bladder Cancer Neg Hx   . Kidney cancer Neg Hx    Social History:  reports that she quit smoking about 5 years ago. Her smoking use included cigarettes. She has never used smokeless tobacco. She reports that she does not drink alcohol or use drugs.  ROS: A complete review of systems was performed.  All systems are negative except for pertinent findings as noted. ROS   Physical Exam:  Vital signs in last 24 hours: Temp:  [97.6 F (36.4 C)-98 F (36.7 C)] 97.6 F (36.4 C) (04/26 1959) Pulse Rate:  [61-177] 71 (04/26 2000) Resp:  [12-29] 13 (04/26 2000) BP: (78-199)/(57-163) 113/96 (04/26 2000) SpO2:  [96 %-100 %] 100 % (04/26 2000) Weight:  [77.2 kg-86 kg] 77.2 kg (04/26 1836) General: Nonverbal HEENT: Normocephalic, atraumatic Neck: No JVD or lymphadenopathy Cardiovascular: Regular rate and rhythm Lungs: Regular rate and effort Abdomen: Soft, nontender, nondistended, no abdominal masses Back: No CVA tenderness Extremities: No edema  Laboratory Data:  Results for orders placed or performed during the hospital encounter of 06/02/19 (from the past 24 hour(s))  CBC with Differential     Status: Abnormal   Collection Time: 06/02/19 10:40 AM  Result Value Ref Range   WBC 20.0 (H) 4.0 - 10.5 K/uL   RBC 3.47 (L) 3.87 - 5.11 MIL/uL   Hemoglobin 9.9 (L) 12.0 - 15.0 g/dL   HCT 26.9 (L) 36.0 - 46.0 %   MCV 77.5 (L) 80.0 - 100.0 fL   MCH 28.5 26.0 -  34.0 pg   MCHC 36.8 (H) 30.0 - 36.0 g/dL   RDW 14.8 11.5 - 15.5 %   Platelets 69 (L) 150 - 400 K/uL   nRBC 0.1 0.0 - 0.2 %   Neutrophils Relative % 76 %   Neutro Abs 15.2 (H) 1.7 - 7.7 K/uL   Lymphocytes Relative 10 %   Lymphs Abs 2.0 0.7 - 4.0 K/uL   Monocytes Relative 4 %   Monocytes Absolute 0.9 0.1 - 1.0 K/uL   Eosinophils Relative 1 %   Eosinophils Absolute 0.2 0.0 - 0.5 K/uL   Basophils Relative 1 %   Basophils Absolute 0.2 (H) 0.0 - 0.1  K/uL   WBC Morphology MORPHOLOGY UNREMARKABLE    Smear Review PLATELETS APPEAR DECREASED    Immature Granulocytes 8 %   Abs Immature Granulocytes 1.57 (H) 0.00 - 0.07 K/uL   Target Cells PRESENT    Giant PLTs PRESENT   Comprehensive metabolic panel     Status: Abnormal   Collection Time: 06/02/19 10:40 AM  Result Value Ref Range   Sodium 143 135 - 145 mmol/L   Potassium 4.8 3.5 - 5.1 mmol/L   Chloride 111 98 - 111 mmol/L   CO2 19 (L) 22 - 32 mmol/L   Glucose, Bld 137 (H) 70 - 99 mg/dL   BUN 139 (H) 8 - 23 mg/dL   Creatinine, Ser 3.55 (H) 0.44 - 1.00 mg/dL   Calcium 8.6 (L) 8.9 - 10.3 mg/dL   Total Protein 7.5 6.5 - 8.1 g/dL   Albumin 2.0 (L) 3.5 - 5.0 g/dL   AST 104 (H) 15 - 41 U/L   ALT 94 (H) 0 - 44 U/L   Alkaline Phosphatase 245 (H) 38 - 126 U/L   Total Bilirubin 8.3 (H) 0.3 - 1.2 mg/dL   GFR calc non Af Amer 12 (L) >60 mL/min   GFR calc Af Amer 14 (L) >60 mL/min   Anion gap 13 5 - 15  Troponin I (High Sensitivity)     Status: None   Collection Time: 06/02/19 10:40 AM  Result Value Ref Range   Troponin I (High Sensitivity) 14 <18 ng/L  Urinalysis, Complete w Microscopic     Status: Abnormal   Collection Time: 06/02/19 10:41 AM  Result Value Ref Range   Color, Urine YELLOW YELLOW   APPearance HAZY (A) CLEAR   Specific Gravity, Urine 1.015 1.005 - 1.030   pH 7.0 5.0 - 8.0   Glucose, UA NEGATIVE NEGATIVE mg/dL   Hgb urine dipstick MODERATE (A) NEGATIVE   Bilirubin Urine SMALL (A) NEGATIVE   Ketones, ur NEGATIVE NEGATIVE  mg/dL   Protein, ur 100 (A) NEGATIVE mg/dL   Nitrite NEGATIVE NEGATIVE   Leukocytes,Ua SMALL (A) NEGATIVE   Squamous Epithelial / LPF 0-5 0 - 5   WBC, UA 0-5 0 - 5 WBC/hpf   RBC / HPF 6-10 0 - 5 RBC/hpf   Bacteria, UA FEW (A) NONE SEEN   Mucus PRESENT   Respiratory Panel by RT PCR (Flu A&B, Covid) - Nasopharyngeal Swab     Status: None   Collection Time: 06/02/19 11:05 AM   Specimen: Nasopharyngeal Swab  Result Value Ref Range   SARS Coronavirus 2 by RT PCR NEGATIVE NEGATIVE   Influenza A by PCR NEGATIVE NEGATIVE   Influenza B by PCR NEGATIVE NEGATIVE  Lactic acid, plasma     Status: None   Collection Time: 06/02/19 12:04 PM  Result Value Ref Range   Lactic Acid, Venous 1.4 0.5 - 1.9 mmol/L  Procalcitonin     Status: None   Collection Time: 06/02/19  5:01 PM  Result Value Ref Range   Procalcitonin 11.21 ng/mL  Lactate dehydrogenase     Status: Abnormal   Collection Time: 06/02/19  5:01 PM  Result Value Ref Range   LDH 326 (H) 98 - 192 U/L  Save Smear     Status: None   Collection Time: 06/02/19  5:01 PM  Result Value Ref Range   Smear Review SMEAR STAINED AND AVAILABLE FOR REVIEW    Recent Results (from the past 240 hour(s))  Respiratory Panel by RT PCR (Flu A&B, Covid) - Nasopharyngeal  Swab     Status: None   Collection Time: 06/02/19 11:05 AM   Specimen: Nasopharyngeal Swab  Result Value Ref Range Status   SARS Coronavirus 2 by RT PCR NEGATIVE NEGATIVE Final    Comment: (NOTE) SARS-CoV-2 target nucleic acids are NOT DETECTED. The SARS-CoV-2 RNA is generally detectable in upper respiratoy specimens during the acute phase of infection. The lowest concentration of SARS-CoV-2 viral copies this assay can detect is 131 copies/mL. A negative result does not preclude SARS-Cov-2 infection and should not be used as the sole basis for treatment or other patient management decisions. A negative result may occur with  improper specimen collection/handling, submission of specimen  other than nasopharyngeal swab, presence of viral mutation(s) within the areas targeted by this assay, and inadequate number of viral copies (<131 copies/mL). A negative result must be combined with clinical observations, patient history, and epidemiological information. The expected result is Negative. Fact Sheet for Patients:  PinkCheek.be Fact Sheet for Healthcare Providers:  GravelBags.it This test is not yet ap proved or cleared by the [place] FDA and  has been authorized for detection and/or diagnosis of SARS-CoV-2 by FDA under an Emergency Use Authorization (EUA). This EUA will remain  in effect (meaning this test can be used) for the duration of the COVID-19 declaration under Section 564(b)(1) of the Act, 21 U.S.C. section 360bbb-3(b)(1), unless the authorization is terminated or revoked sooner.    Influenza A by PCR NEGATIVE NEGATIVE Final   Influenza B by PCR NEGATIVE NEGATIVE Final    Comment: (NOTE) The Xpert Xpress SARS-CoV-2/FLU/RSV assay is intended as an aid in  the diagnosis of influenza from Nasopharyngeal swab specimens and  should not be used as a sole basis for treatment. Nasal washings and  aspirates are unacceptable for Xpert Xpress SARS-CoV-2/FLU/RSV  testing. Fact Sheet for Patients: PinkCheek.be Fact Sheet for Healthcare Providers: GravelBags.it This test is not yet approved or cleared by the [place] FDA and  has been authorized for detection and/or diagnosis of SARS-CoV-2 by  FDA under an Emergency Use Authorization (EUA). This EUA will remain  in effect (meaning this test can be used) for the duration of the  Covid-19 declaration under Section 564(b)(1) of the Act, 21  U.S.C. section 360bbb-3(b)(1), unless the authorization is  terminated or revoked. Performed at [facility], [address] [place], [place]  [id number]    Creatinine: Recent Labs    06/02/19 1040  CREATININE 3.55*   CT scan and ultrasound personally reviewed and is detailed in the history of present illness  Impression/Assessment:  Left ureteral calculus Left hydronephrosis secondary to obstructing calculus Urinary tract infection with sepsis  Plan:  Plan to proceed emergently to the operating room for cystoscopy with left ureteral stent placement.  Risk and benefits discussed with the family including but not limited to bleeding, infection, injury to surrounding structures, death as a result of systemic inflammatory response or anesthetic complications, need for additional procedures.  The patient is a DNR.  Marton Redwood, III 06/02/2019, 8:16 PM

## 2019-06-02 NOTE — Discharge Instructions (Signed)

## 2019-06-02 NOTE — ED Notes (Signed)
Admitting DR at bedside  

## 2019-06-02 NOTE — ED Notes (Signed)
Dr. Blaine Hamper notified of BP [id number] systolic, to place new orders

## 2019-06-02 NOTE — ED Notes (Signed)
US at bedside

## 2019-06-02 NOTE — ED Triage Notes (Signed)
Pt from home via ems with reports from pts husband that pt has had more altered mental status this am than usual. Pt bedridden per baseline with indwelling foley cath. CBG 139 with ems. Pt usually wears 2L [place] but was 88% so increased to 4L.

## 2019-06-03 ENCOUNTER — Inpatient Hospital Stay (HOSPITAL_COMMUNITY)
Admit: 2019-06-03 | Discharge: 2019-06-03 | Disposition: A | Discharge status: Home / Self Care | Attending: Physician Assistant | Admitting: Physician Assistant

## 2019-06-03 DIAGNOSIS — Z515 Encounter for palliative care: Secondary | ICD-10-CM

## 2019-06-03 DIAGNOSIS — R6521 Severe sepsis with septic shock: Secondary | ICD-10-CM

## 2019-06-03 DIAGNOSIS — K625 Hemorrhage of anus and rectum: Secondary | ICD-10-CM

## 2019-06-03 DIAGNOSIS — A419 Sepsis, unspecified organism: Secondary | ICD-10-CM

## 2019-06-03 DIAGNOSIS — Z66 Do not resuscitate: Secondary | ICD-10-CM

## 2019-06-03 DIAGNOSIS — D72829 Elevated white blood cell count, unspecified: Secondary | ICD-10-CM

## 2019-06-03 DIAGNOSIS — N132 Hydronephrosis with renal and ureteral calculous obstruction: Secondary | ICD-10-CM

## 2019-06-03 DIAGNOSIS — J9601 Acute respiratory failure with hypoxia: Secondary | ICD-10-CM

## 2019-06-03 DIAGNOSIS — I350 Nonrheumatic aortic (valve) stenosis: Secondary | ICD-10-CM

## 2019-06-03 DIAGNOSIS — N390 Urinary tract infection, site not specified: Secondary | ICD-10-CM

## 2019-06-03 DIAGNOSIS — G309 Alzheimer's disease, unspecified: Secondary | ICD-10-CM

## 2019-06-03 DIAGNOSIS — D6489 Other specified anemias: Secondary | ICD-10-CM

## 2019-06-03 DIAGNOSIS — F0280 Dementia in other diseases classified elsewhere without behavioral disturbance: Secondary | ICD-10-CM

## 2019-06-03 DIAGNOSIS — I4892 Unspecified atrial flutter: Secondary | ICD-10-CM

## 2019-06-03 DIAGNOSIS — N179 Acute kidney failure, unspecified: Secondary | ICD-10-CM

## 2019-06-03 LAB — URINE CULTURE

## 2019-06-03 LAB — CBC WITH DIFFERENTIAL/PLATELET
Abs Immature Granulocytes: 0.8 10*3/uL — ABNORMAL HIGH (ref 0.00–0.07)
Basophils Absolute: 0.1 10*3/uL (ref 0.0–0.1)
Basophils Relative: 0 %
Eosinophils Absolute: 0.1 10*3/uL (ref 0.0–0.5)
Eosinophils Relative: 0 %
HCT: 18.6 % — ABNORMAL LOW (ref 36.0–46.0)
Hemoglobin: 6.8 g/dL — ABNORMAL LOW (ref 12.0–15.0)
Immature Granulocytes: 5 %
Lymphocytes Relative: 7 %
Lymphs Abs: 1.1 10*3/uL (ref 0.7–4.0)
MCH: 28.7 pg (ref 26.0–34.0)
MCHC: 36.6 g/dL — ABNORMAL HIGH (ref 30.0–36.0)
MCV: 78.5 fL — ABNORMAL LOW (ref 80.0–100.0)
Monocytes Absolute: 0.6 10*3/uL (ref 0.1–1.0)
Monocytes Relative: 4 %
Neutro Abs: 14.3 10*3/uL — ABNORMAL HIGH (ref 1.7–7.7)
Neutrophils Relative %: 84 %
Platelets: 52 10*3/uL — ABNORMAL LOW (ref 150–400)
RBC: 2.37 MIL/uL — ABNORMAL LOW (ref 3.87–5.11)
RDW: 15 % (ref 11.5–15.5)
WBC: 16.9 10*3/uL — ABNORMAL HIGH (ref 4.0–10.5)
nRBC: 0 % (ref 0.0–0.2)

## 2019-06-03 LAB — MAGNESIUM: Magnesium: 2.9 mg/dL — ABNORMAL HIGH (ref 1.7–2.4)

## 2019-06-03 LAB — IRON AND TIBC
Iron: 88 ug/dL (ref 28–170)
Saturation Ratios: 56 % — ABNORMAL HIGH (ref 10.4–31.8)
TIBC: 158 ug/dL — ABNORMAL LOW (ref 250–450)
UIBC: 70 ug/dL

## 2019-06-03 LAB — HEMOGLOBIN AND HEMATOCRIT, BLOOD
HCT: 26.1 % — ABNORMAL LOW (ref 36.0–46.0)
HCT: 26.8 % — ABNORMAL LOW (ref 36.0–46.0)
Hemoglobin: 9.3 g/dL — ABNORMAL LOW (ref 12.0–15.0)
Hemoglobin: 9.5 g/dL — ABNORMAL LOW (ref 12.0–15.0)

## 2019-06-03 LAB — PREPARE RBC (CROSSMATCH)

## 2019-06-03 LAB — HEMOGLOBIN A1C
Hgb A1c MFr Bld: 6.5 % — ABNORMAL HIGH (ref 4.8–5.6)
Mean Plasma Glucose: 139.85 mg/dL

## 2019-06-03 LAB — BASIC METABOLIC PANEL
Anion gap: 9 (ref 5–15)
BUN: 105 mg/dL — ABNORMAL HIGH (ref 8–23)
CO2: 29 mmol/L (ref 22–32)
Calcium: 7 mg/dL — ABNORMAL LOW (ref 8.9–10.3)
Chloride: 119 mmol/L — ABNORMAL HIGH (ref 98–111)
Creatinine, Ser: 2.49 mg/dL — ABNORMAL HIGH (ref 0.44–1.00)
GFR calc Af Amer: 22 mL/min — ABNORMAL LOW (ref >60)
GFR calc non Af Amer: 19 mL/min — ABNORMAL LOW (ref >60)
Glucose, Bld: 144 mg/dL — ABNORMAL HIGH (ref 70–99)
Potassium: 3.9 mmol/L (ref 3.5–5.1)
Sodium: 157 mmol/L — ABNORMAL HIGH (ref 135–145)

## 2019-06-03 LAB — CREATININE, URINE, RANDOM: Creatinine, Urine: 55 mg/dL

## 2019-06-03 LAB — C DIFFICILE QUICK SCREEN W PCR REFLEX
C Diff antigen: NEGATIVE
C Diff interpretation: NOT DETECTED
C Diff toxin: NEGATIVE

## 2019-06-03 LAB — GLUCOSE, CAPILLARY
Glucose-Capillary: 104 mg/dL — ABNORMAL HIGH (ref 70–99)
Glucose-Capillary: 105 mg/dL — ABNORMAL HIGH (ref 70–99)
Glucose-Capillary: 107 mg/dL — ABNORMAL HIGH (ref 70–99)
Glucose-Capillary: 108 mg/dL — ABNORMAL HIGH (ref 70–99)
Glucose-Capillary: 115 mg/dL — ABNORMAL HIGH (ref 70–99)
Glucose-Capillary: 117 mg/dL — ABNORMAL HIGH (ref 70–99)
Glucose-Capillary: 117 mg/dL — ABNORMAL HIGH (ref 70–99)

## 2019-06-03 LAB — PROTIME-INR
INR: 1.4 — ABNORMAL HIGH (ref 0.8–1.2)
Prothrombin Time: 17.4 seconds — ABNORMAL HIGH (ref 11.4–15.2)

## 2019-06-03 LAB — PATHOLOGIST SMEAR REVIEW

## 2019-06-03 LAB — HEPATITIS PANEL, ACUTE
HCV Ab: NONREACTIVE
Hep A IgM: NONREACTIVE
Hep B C IgM: NONREACTIVE
Hepatitis B Surface Ag: NONREACTIVE

## 2019-06-03 LAB — VITAMIN B12: Vitamin B-12: 5417 pg/mL — ABNORMAL HIGH (ref 180–914)

## 2019-06-03 LAB — TSH: TSH: 3.339 u[IU]/mL (ref 0.350–4.500)

## 2019-06-03 LAB — PHOSPHORUS: Phosphorus: 5.3 mg/dL — ABNORMAL HIGH (ref 2.5–4.6)

## 2019-06-03 LAB — HIV ANTIBODY (ROUTINE TESTING W REFLEX): HIV Screen 4th Generation wRfx: NONREACTIVE — AB

## 2019-06-03 LAB — ABO/RH: ABO/RH(D): AB POS

## 2019-06-03 LAB — FERRITIN: Ferritin: 4286 ng/mL — ABNORMAL HIGH (ref 11–307)

## 2019-06-03 LAB — FOLATE: Folate: 13.7 ng/mL (ref >5.9)

## 2019-06-03 LAB — TRIGLYCERIDES: Triglycerides: 499 mg/dL — ABNORMAL HIGH (ref <150)

## 2019-06-03 LAB — OCCULT BLOOD X 1 CARD TO LAB, STOOL: Fecal Occult Bld: POSITIVE — AB

## 2019-06-03 LAB — APTT: aPTT: 26 seconds (ref 24–36)

## 2019-06-03 LAB — FIBRINOGEN: Fibrinogen: >750 mg/dL — ABNORMAL HIGH (ref 210–475)

## 2019-06-03 MED ORDER — SODIUM CHLORIDE 0.9 % IV SOLN
2.0000 g | Freq: Every day | INTRAVENOUS | Status: DC
  Administered 2019-06-03 – 2019-06-05 (×3): 2 g via INTRAVENOUS
  Filled 2019-06-03 (×2): qty 20
  Filled 2019-06-03 (×2): qty 2

## 2019-06-03 MED ORDER — MIDAZOLAM HCL 2 MG/2ML IJ SOLN
1.0000 mg | INTRAMUSCULAR | Status: DC | PRN
  Administered 2019-06-04: 1 mg via INTRAVENOUS
  Filled 2019-06-03: qty 2

## 2019-06-03 MED ORDER — ORAL CARE MOUTH RINSE
15.0000 mL | OROMUCOSAL | Status: DC
  Administered 2019-06-03 – 2019-06-05 (×16): 15 mL via OROMUCOSAL

## 2019-06-03 MED ORDER — FENTANYL CITRATE (PF) 100 MCG/2ML IJ SOLN
25.0000 ug | INTRAMUSCULAR | Status: DC | PRN
  Administered 2019-06-03 – 2019-06-04 (×2): 50 ug via INTRAVENOUS
  Administered 2019-06-04 – 2019-06-05 (×2): 25 ug via INTRAVENOUS
  Filled 2019-06-03 (×4): qty 2

## 2019-06-03 MED ORDER — PANTOPRAZOLE SODIUM 40 MG IV SOLR
40.0000 mg | Freq: Two times a day (BID) | INTRAVENOUS | Status: DC
  Administered 2019-06-03 – 2019-06-04 (×3): 40 mg via INTRAVENOUS
  Filled 2019-06-03 (×3): qty 40

## 2019-06-03 MED ORDER — LACTATED RINGERS IV BOLUS
1000.0000 mL | Freq: Once | INTRAVENOUS | Status: AC
  Administered 2019-06-03: 08:00:00 1000 mL via INTRAVENOUS

## 2019-06-03 MED ORDER — DEXMEDETOMIDINE HCL IN NACL 400 MCG/100ML IV SOLN
0.4000 ug/kg/h | INTRAVENOUS | Status: DC
  Administered 2019-06-03: 07:00:00 0.5 ug/kg/h via INTRAVENOUS
  Filled 2019-06-03: qty 100

## 2019-06-03 MED ORDER — FENTANYL 2500MCG IN NS 250ML (10MCG/ML) PREMIX INFUSION: 0.0000 ug/h | INTRAVENOUS | Status: DC

## 2019-06-03 MED ORDER — SODIUM CHLORIDE 0.9% IV SOLUTION: Freq: Once | INTRAVENOUS | Status: AC

## 2019-06-03 MED ORDER — SODIUM BICARBONATE 8.4 % IV SOLN
50.0000 meq | Freq: Once | INTRAVENOUS | Status: AC
  Administered 2019-06-03: 01:00:00 50 meq via INTRAVENOUS
  Filled 2019-06-03: qty 50

## 2019-06-03 MED ORDER — DEXTROSE 5 % IV SOLN: INTRAVENOUS | Status: DC

## 2019-06-03 MED ORDER — CHLORHEXIDINE GLUCONATE 0.12% ORAL RINSE (MEDLINE KIT)
15.0000 mL | Freq: Two times a day (BID) | OROMUCOSAL | Status: DC
  Administered 2019-06-03 – 2019-06-04 (×3): 15 mL via OROMUCOSAL

## 2019-06-03 NOTE — Progress Notes (Signed)
Pharmacy Antibiotic Note  Jamie Boyd is a 69 y.o. female admitted from hospice care at home on 06/02/2019 with altered mental status. Patient has a PMH significant for Parkinson's disease, dementia, iron deficiency anemia, stroke, indwelling catheter, and recurrent UTIs. Patient with AKI, SCr 3.55 with last known ~ 1.0 in July 2020. Unclear if this is an acute issue vs chronic renal dysfunction. Pharmacy previously consulted for vancomycin and meropenem dosing for empiric sepsis treatment. Pt has no history of ESBL infection.    Plan: Vancomycin discontinued 0427 0714.   Meropenem discontinued 0427 1010 to narrow antibiotic spectrum, per rounds.  Ceftriaxone 2 g IV q24h initiated for a more narrow coverage of urosepsis.   Vancomycin/ Meropenem administered prior to blood and urine cultures.  Height: 5\' 6"  (167.6 cm) Weight: 77.2 kg (170 lb 3.1 oz) IBW/kg (Calculated) : 59.3  Temp (24hrs), Avg:97.6 F (36.4 C), Min:96.7 F (35.9 C), Max:98.4 F (36.9 C)  Recent Labs  Lab 06/02/19 1040 06/02/19 1204 06/03/19 0039  WBC 20.0*  --  16.9*  CREATININE 3.55*  --  2.49*  LATICACIDVEN  --  1.4  --     Estimated Creatinine Clearance: 22.1 mL/min (A) (by C-G formula based on SCr of 2.49 mg/dL (H)).    Allergies  Allergen Reactions  . Pollen Extract Other (See Comments)    Antimicrobials this admission: Vancomycin 4/26 >> 4/27 Meropenem 4/26 >> 4/27 Ceftriaxone 4/27 >>  Dose adjustments this admission: NA  Microbiology:  4/26 BCx: no growth < 12 hrs  4/26 UCx: pending  4/26 C. Diff: negative  4/26 MRSA PCR: negative  4/26 COVID: negative  4/26 Influenza: negative   Thank you for allowing pharmacy to be a part of this patient's care.  Mitchel Honour  PharmD Student 06/03/2019 11:14 AM

## 2019-06-03 NOTE — Progress Notes (Signed)
Dr. Mortimer Fries notified of patients blood pressure in the 60's. LR bolus ordered. Awaiting for blood to be ready, still pending.

## 2019-06-03 NOTE — Progress Notes (Signed)
Initial Nutrition Assessment  DOCUMENTATION CODES:   Not applicable  INTERVENTION:  If patient expected to remain intubated >24-48 hours recommend initiating tube feeds once enteral access is obtained.   If tube feeds are started recommend initiating Vital AF 1.2 at 15 mL/hr and advancing by 20 mL/hr every 8 hours to goal rate of 55 mL/hr (1320 mL goal daily volume). Provides 1584 kcal, 99 grams of protein, 1069 mL H2O daily.  If tube feeds are started provide MVI daily per tube and a minimum free water flush of 30 mL Q4hrs.   NUTRITION DIAGNOSIS:   Inadequate oral intake related to inability to eat as evidenced by NPO status.  GOAL:   Patient will meet greater than or equal to 90% of their needs  MONITOR:   Vent status, Labs, Weight trends, I & O's  REASON FOR ASSESSMENT:   Ventilator    ASSESSMENT:   69 year old female with PMHx of Alzheimer's dementia, Parkinson's disease admitted with sepsis, AKI, acute encephalopathy, UTI in the setting of obstructing left ureteral calculus s/p cystoscopy and left ureteral stent placement on 4/26 who remained mechanically intubated post-operatively.   Patient is currently intubated on ventilator support MV: 10.6 L/min Temp (24hrs), Avg:97.6 F (36.4 C), Min:96.7 F (35.9 C), Max:98.4 F (36.9 C)  Propofol: N/A  Medications reviewed and include: Novolog 0-9 units Q4hrs, Protonix, ceftriaxone, D5W at 100 mL/hr.   Labs reviewed: CBG 104-117, Triglycerides 499, Sodium 157, Chloride 119, BUN 105, Creatinine 2.49, Phosphorus 5.3, Magnesium 2.9.  Enteral Access: none at this time  Discussed with RN and on rounds. Plan is to hold off on starting tube feeds today.  NUTRITION - FOCUSED PHYSICAL EXAM:    Most Recent Value  Orbital Region  No depletion  Upper Arm Region  No depletion  Thoracic and Lumbar Region  No depletion  Buccal Region  Unable to assess  Temple Region  No depletion  Clavicle Bone Region  No depletion  Clavicle  and Acromion Bone Region  Mild depletion  Scapular Bone Region  Unable to assess  Dorsal Hand  No depletion  Patellar Region  Mild depletion  Anterior Thigh Region  Mild depletion  Posterior Calf Region  Mild depletion  Edema (RD Assessment)  -- [nonpitting]  Hair  Reviewed  Eyes  Unable to assess  Mouth  Unable to assess  Skin  Reviewed  Nails  Reviewed     Diet Order:   Diet Order            Diet NPO time specified  Diet effective now             EDUCATION NEEDS:   No education needs have been identified at this time  Skin:  Skin Assessment: Reviewed RN Assessment(ecchymosis)  Last BM:  06/02/2019 - large type 7  Height:   Ht Readings from Last 1 Encounters:  06/02/19 5\' 6"  (1.676 m)   Weight:   Wt Readings from Last 1 Encounters:  06/02/19 77.2 kg   BMI:  Body mass index is 27.47 kg/m.  Estimated Nutritional Needs:   Kcal:  [id number] (PSU 2003b w/ MSJ 1313, Ve 10.6, Tmax 36.9)  Protein:  95-105 grams  Fluid:  1.9 L/day  Jacklynn Barnacle, MS, RD, LDN Pager number available on Amion

## 2019-06-03 NOTE — Anesthesia Postprocedure Evaluation (Signed)
Anesthesia Post Note  Patient: Event organiser  Procedure(s) Performed: CYSTOSCOPY WITH STENT PLACEMENT (Left Ureter)  Patient location during evaluation: SICU Anesthesia Type: General Level of consciousness: sedated Pain management: pain level controlled Vital Signs Assessment: post-procedure vital signs reviewed and stable Respiratory status: patient remains intubated per anesthesia plan Cardiovascular status: stable Postop Assessment: no apparent nausea or vomiting Anesthetic complications: no     Last Vitals:  Vitals:   06/03/19 0600 06/03/19 0702  BP: (!) 82/54 (!) 94/59  Pulse:    Resp: (!) 21 13  Temp:  (!) 36.4 C  SpO2:  99%    Last Pain:  Vitals:   06/03/19 0358  TempSrc: Axillary                 Caryl Asp

## 2019-06-03 NOTE — Progress Notes (Addendum)
Family At bedside, clinical status relayed to family  Updated and notified of patients medical condition-  Progressive multiorgan failure with very low chance of meaningful recovery.  Patient with multiorgan failure  Family understands the situation.  They have consented and agreed to DNR Status Will continue medical care and assess prognosis daily.   Family are satisfied with Plan of action and management. All questions answered  Palliative care consulted  Addition CC time 32 mins  Britlee Skolnik Patricia Pesa, M.D.  [facility]  Medical Director [place] Director [facility] Cardio-Pulmonary Department

## 2019-06-03 NOTE — ACP (Advance Care Planning) (Addendum)
Health care agents added to EPIC per historical AD in [place].  Note, scanned AD's missing last page w/signatures.

## 2019-06-03 NOTE — Progress Notes (Signed)
Urology Inpatient Progress Note  Subjective: Jamie Boyd is a 69 y.o. female on hospice with PMH stroke, recurrent UTI, Parkinson's disease, and dementia admitted on 06/02/2019 with a 7 mm distal left ureteral calculus with urosepsis.  She is POD 1 from cystoscopy and left ureteral stent placement with Dr. Gloriann Loan.  She is afebrile and hypotensive.  HR 60s-70s.  Urine culture pending, blood culture pending with no growth at <12 hours.  On antibiotics as below.  Foley in place draining amber-colored urine.  Patient remains intubated and is unable to contribute to HPI.  Anti-infectives: Anti-infectives (From admission, onward)   Start     Dose/Rate Route Frequency Ordered Stop   06/03/19 1800  cefTRIAXone (ROCEPHIN) 2 g in sodium chloride 0.9 % 100 mL IVPB     2 g 200 mL/hr over 30 Minutes Intravenous Daily-1800 06/03/19 1010     06/02/19 2300  meropenem (MERREM) 1 g in sodium chloride 0.9 % 100 mL IVPB  Status:  Discontinued     1 g 200 mL/hr over 30 Minutes Intravenous Every 12 hours 06/02/19 1441 06/03/19 1010   06/02/19 1440  vancomycin variable dose per unstable renal function (pharmacist dosing)  Status:  Discontinued      Does not apply See admin instructions 06/02/19 1441 06/03/19 0714   06/02/19 1400  vancomycin (VANCOREADY) IVPB 1750 mg/350 mL     1,750 mg 175 mL/hr over 120 Minutes Intravenous  Once 06/02/19 1306 06/02/19 1622   06/02/19 1400  meropenem (MERREM) 1 g in sodium chloride 0.9 % 100 mL IVPB     1 g 200 mL/hr over 30 Minutes Intravenous  Once 06/02/19 1306 06/02/19 1344      Current Facility-Administered Medications  Medication Dose Route Frequency Provider Last Rate Last Admin  . 0.9 %  sodium chloride infusion  250 mL Intravenous Continuous Awilda Bill, NP   Stopped at 06/03/19 [phone]  . albuterol (PROVENTIL) (2.5 MG/3ML) 0.083% nebulizer solution 2.5 mg  2.5 mg Nebulization Q4H PRN Ivor Costa, MD      . cefTRIAXone (ROCEPHIN) 2 g in sodium chloride 0.9 % 100  mL IVPB  2 g Intravenous q1800 Flora Lipps, MD      . Chlorhexidine Gluconate Cloth 2 % PADS 6 each  6 each Topical Daily Awilda Bill, NP      . dextrose 5 % solution   Intravenous Continuous Kolluru, Sarath, MD 100 mL/hr at 06/03/19 1258 Rate Verify at 06/03/19 1258  . fentaNYL (SUBLIMAZE) injection 25-50 mcg  25-50 mcg Intravenous Q1H PRN Flora Lipps, MD      . insulin aspart (novoLOG) injection 0-9 Units  0-9 Units Subcutaneous Q4H Awilda Bill, NP      . midazolam (VERSED) injection 1-2 mg  1-2 mg Intravenous Q2H PRN Flora Lipps, MD      . ondansetron (ZOFRAN) injection 4 mg  4 mg Intravenous Q8H PRN Ivor Costa, MD      . pantoprazole (PROTONIX) injection 40 mg  40 mg Intravenous Q12H Awilda Bill, NP   40 mg at 06/03/19 0434     Objective: Vital signs in last 24 hours: Temp:  [96.7 F (35.9 C)-98.4 F (36.9 C)] 97.8 F (36.6 C) (04/27 1242) Pulse Rate:  [61-95] 76 (04/27 1500) Resp:  [12-23] 14 (04/27 1500) BP: (67-199)/(43-163) 99/55 (04/27 1500) SpO2:  [96 %-100 %] 100 % (04/27 1500) FiO2 (%):  [30 %-50 %] 30 % (04/27 1050) Weight:  [77.2 kg] 77.2 kg (04/26 1836)  Intake/Output from previous day: 04/26 0701 - 04/27 0700 In: 2568.1 [I.V.:2025.8; IV Piggyback:542.3] Out: 1625 [Urine:1625] Intake/Output this shift: Total I/O In: 1042.2 [I.V.:582.2; Blood:360; IV Piggyback:100] Out: 250 [Urine:250]  Physical Exam Vitals and nursing note reviewed.  Constitutional:      Interventions: She is intubated.  Pulmonary:     Effort: She is intubated.  Skin:    General: Skin is warm and dry.    Lab Results:  Recent Labs    06/02/19 1040 06/02/19 1040 06/03/19 0039 06/03/19 1422  WBC 20.0*  --  16.9*  --   HGB 9.9*   < > 6.8* 9.3*  HCT 26.9*   < > 18.6* 26.1*  PLT 69*  --  52*  --    < > = values in this interval not displayed.   BMET Recent Labs    06/02/19 1040 06/03/19 0039  NA 143 157*  K 4.8 3.9  CL 111 119*  CO2 19* 29  GLUCOSE 137* 144*   BUN 139* 105*  CREATININE 3.55* 2.49*  CALCIUM 8.6* 7.0*   PT/INR Recent Labs    06/02/19 1927 06/03/19 0039  LABPROT 17.6* 17.4*  INR 1.5* 1.4*   ABG Recent Labs    06/02/19 2300  PHART 7.31*  HCO3 15.6*   Assessment & Plan: 69 year old female on hospice with a history of stroke, recurrent UTI, Parkinson's disease, and dementia now s/p left ureteral stent placement for management of an obstructing 7 mm distal left ureteral stone.  She remains intubated in the ICU.  I had a lengthy conversation with the patient's niece at the bedside today regarding definitive stone management moving forward.  I explained that the ureteral stent was placed to relieve urinary obstruction and obtain source control of her infection.  I explained that she will require approximately 2 weeks of antibiotics and that thereafter she can undergo definitive stone management in the form of ureteroscopy with laser lithotripsy and stent exchange.  Recommendations: -Continue broad-spectrum antibiotics and follow cultures x14 days -She will need definitive stone management in 2-3 weeks once infection is cleared in the form of ureteroscopy with laser lithotripsy and stent exchange.  Recommend clarifying goals of care with patient's family given her status on hospice and DNR.  Debroah Loop, PA-C 06/03/2019

## 2019-06-03 NOTE — Consult Note (Signed)
Consultation Note Date: 06/03/2019   Patient Name: Jamie Boyd  DOB: 1950-09-12  MRN: [id number]  Age / Sex: 69 y.o., female  PCP: Sharyne Peach, MD Referring Physician: Flora Lipps, MD  Reason for Consultation: Establishing goals of care and Psychosocial/spiritual support  HPI/Patient Profile: 69 y.o. female  admitted on 06/02/2019 with past  medical history significant for hyperlipidemia, stroke, GERD, Mancia indwelling Foley catheter, recurrent UTI, Parkinson's disease, iron deficiency anemia, who presents with altered mental status.  Admitted for treatment and stabilization for sepsis, acute renal failure and acute encephalopathy secondary to UTI in the setting of obstructing left ureteral calculus status post cystoscopy and left ureteral stent placement.  Noted GI bleed.  Per family patient  is total care for all ADLs.  She is bed-bound. She had become more lethargic over th past week, she is a hospice patient with [facility] since 12/2019.  In ER patient was found to have oxygen desaturation to 88% on home level 2 L nasal cannula oxygen, which improved her to 90% on 4 L oxygen, later on improved to 97% on 2 L Patient was found to have A. fib with RVR with heart rate up to 200  Currently patient is intubated, likely poor prognosis   Family face treatment option decisions, advanced directive decisions and anticipatory care needs.   Clinical Assessment and Goals of Care:   This NP Wadie Lessen reviewed medical records, received report from team, assessed the patient and then meet at the patient's bedside along with her family to include/  Brother/Nelson Jefferies, SIL/Lisa Jeffereis, and niece [name] to discuss diagnosis, prognosis, GOC, EOL wishes disposition and options.  Concept of Hospice and Palliative Care were discussed  Created space and opportunity for family to explore  their thoughts and feelings regarding current medcial situation.  All understand the seriousness of the current medical situation but remain hopeful for improvement.  They are asking for "a little more time" to see if patient can rebound.  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  Concepts specific to code status, artifical feeding and hydration, continued IV antibiotics and rehospitalization was had.  The difference between a aggressive medical intervention path  and a palliative comfort care path for this patient at this time was had.  Values and goals of care important to patient and family were attempted to be elicited.  Education offered around the importance of taking decisions 1 day at a time and making those decisions dependent on the patient's outcomes.  Family is hopeful for improvement.  If patient does not begin to improve we will further discuss goals of care and shift to a more palliative comfort path.  Natural trajectory and expectations at EOL were discussed.  Questions and concerns addressed.   Family encouraged to call with questions or concerns.    PMT will continue to support holistically.      NEXT OF KIN    SUMMARY OF RECOMMENDATIONS    Code  Status/Advance Care Planning:  DNR   MOST form noted in EMR   Symptom Management:   Pain: Fentanyl 25-50 mcg IV every 1 hr  Palliative Prophylaxis:   Aspiration, Bowel Regimen, Delirium Protocol, Frequent Pain Assessment and Oral Care  Additional Recommendations (Limitations, Scope, Preferences):  Full Scope Treatment  Psycho-social/Spiritual:   Desire for further Chaplaincy support:yes  Additional Recommendations: Education on Hospice and Grief/Bereavement Support  Prognosis:   Unable to determine  Discharge Planning: To Be Determined      Primary Diagnoses: Present on Admission: . Acute metabolic encephalopathy . AKI (acute kidney injury)  ([place]) . CVA (cerebral vascular accident) ([place]) . GERD without esophagitis . Iron deficiency anemia due to chronic blood loss . Alzheimer disease ([place]) . HLD (hyperlipidemia) . Atrial fibrillation with RVR ([place]) . Abnormal LFTs . Thrombocytopenia ([place]) . Sepsis ([place])   I have reviewed the medical record, interviewed the patient and family, and examined the patient. The following aspects are pertinent.  Past Medical History:  Diagnosis Date  . Alzheimer disease ([place])   . Anemia   . Dementia ([place])   . Goiter   . Memory deficit   . Parkinson's disease ([place])   . Urinary tract infection, site not specified    Social History   Socioeconomic History  . Marital status: Divorced    Spouse name: Not on file  . Number of children: Not on file  . Years of education: Not on file  . Highest education level: Not on file  Occupational History  . Not on file  Tobacco Use  . Smoking status: Former Smoker    Types: Cigarettes    Quit date: 01/25/2014    Years since quitting: 5.3  . Smokeless tobacco: Never Used  Substance and Sexual Activity  . Alcohol use: No  . Drug use: No  . Sexual activity: Not Currently  Other Topics Concern  . Not on file  Social History Narrative   Lives at home with family. Ambulates with minimal assistance at baseline       Social Determinants of Health   Financial Resource Strain:   . Difficulty of Paying Living Expenses:   Food Insecurity:   . Worried About Charity fundraiser in the Last Year:   . Arboriculturist in the Last Year:   Transportation Needs:   . Film/video editor (Medical):   Marland Kitchen Lack of Transportation (Non-Medical):   Physical Activity:   . Days of Exercise per Week:   . Minutes of Exercise per Session:   Stress:   . Feeling of Stress :   Social Connections:   . Frequency of Communication with Friends and Family:   . Frequency of Social Gatherings with Friends and Family:   . Attends Religious Services:   . Active Member  of Clubs or Organizations:   . Attends Archivist Meetings:   Marland Kitchen Marital Status:    Family History  Problem Relation Age of Onset  . Diabetes Maternal Uncle   . Prostate cancer Neg Hx   . Bladder Cancer Neg Hx   . Kidney cancer Neg Hx    Scheduled Meds: . Chlorhexidine Gluconate Cloth  6 each Topical Daily  . insulin aspart  0-9 Units Subcutaneous Q4H  . pantoprazole (PROTONIX) IV  40 mg Intravenous Q12H   Continuous Infusions: . sodium chloride Stopped (06/03/19 0352)  . cefTRIAXone (ROCEPHIN)  IV    . dextrose 100 mL/hr at 06/03/19 0948   PRN Meds:.albuterol, fentaNYL (  SUBLIMAZE) injection, midazolam, ondansetron (ZOFRAN) IV Medications Prior to Admission:  Prior to Admission medications   Medication Sig Start Date End Date Taking? Authorizing Provider  Acetaminophen 500 MG capsule Take 1,000 mg by mouth every 12 (twelve) hours as needed for pain.    Yes [provider]  albuterol (PROVENTIL) (2.5 MG/3ML) 0.083% nebulizer solution Take 2.5 mg by nebulization every 4 (four) hours as needed for wheezing or shortness of breath.   Yes [provider]  aspirin EC 81 MG tablet Take 81 mg by mouth daily.   Yes [provider]  bisacodyl (DULCOLAX) 10 MG suppository Place 10 mg rectally daily as needed for moderate constipation.   Yes [provider]  divalproex (DEPAKOTE SPRINKLE) 125 MG capsule Take 125 mg by mouth 3 (three) times daily.    Yes [provider]  fexofenadine (ALLEGRA) 180 MG tablet Take 180 mg by mouth daily.   Yes [provider]  furosemide (LASIX) 20 MG tablet Take by mouth. 06/02/19 06/01/20 Yes [provider]  hydrochlorothiazide (MICROZIDE) 12.5 MG capsule TAKE 1 CAPSULE BY MOUTH ONCE DAILY 08/27/18  Yes [provider]  ibuprofen (ADVIL) 400 MG tablet Take 400 mg by mouth 3 (three) times daily as needed for fever or mild pain.    Yes [provider]  LORazepam (ATIVAN) 0.5 MG  tablet Take 0.5 mg by mouth every 4 (four) hours as needed (anxiety, agitation, restlessness or nausea).    Yes [provider]  montelukast (SINGULAIR) 10 MG tablet Take 10 mg by mouth at bedtime.   Yes [provider]  Morphine Sulfate (MORPHINE CONCENTRATE) 10 mg / 0.5 ml concentrated solution Take 5 mg by mouth every hour as needed for severe pain or shortness of breath.    Yes [provider]  nystatin (NYSTATIN) powder Apply 1 application topically 2 (two) times daily as needed (skin irritation).   Yes [provider]  omeprazole (PRILOSEC) 20 MG capsule Take 20 mg by mouth daily.    Yes [provider]  oxybutynin (DITROPAN) 5 MG tablet Take 5 mg by mouth 2 (two) times daily.   Yes [provider]  oxyCODONE (OXY IR/ROXICODONE) 5 MG immediate release tablet Take 2.5-5 mg by mouth every 4 (four) hours as needed for severe pain or breakthrough pain.   Yes [provider]  phenylephrine-shark liver oil-mineral oil-petrolatum (PREPARATION H) 0.25-3-14-71.9 % rectal ointment Place 1 application rectally 4 (four) times daily as needed for hemorrhoids.   Yes [provider]  QUEtiapine (SEROQUEL) 100 MG tablet Take 100 mg by mouth at bedtime.   Yes [provider]  QUEtiapine (SEROQUEL) 25 MG tablet Take 25 mg by mouth daily as needed (awaking overnight).   Yes [provider]  senna-docusate (SENOKOT-S) 8.6-50 MG tablet Take 1-3 tablets by mouth 3 (three) times daily as needed for mild constipation.   Yes [provider]  sennosides-docusate sodium (SENOKOT-S) 8.6-50 MG tablet Take 2 tablets by mouth daily.   Yes [provider]  traZODone (DESYREL) 100 MG tablet Take 100 mg by mouth at bedtime.   Yes [provider]  witch hazel-glycerin (TUCKS) pad Apply 1 application topically as needed for itching or irritation.   Yes [provider]  memantine (NAMENDA) 10 MG tablet Take  10 mg by mouth 2 (two) times daily.  11/23/15 11/27/17  [provider]   Allergies  Allergen Reactions  . Pollen Extract Other (See Comments)   Review of Systems  Unable to perform ROS   Physical Exam Constitutional:      Appearance: She is ill-appearing.     Interventions: She is intubated.  Cardiovascular:     Rate and Rhythm: Normal rate.  Pulmonary:     Effort: She is intubated.  Musculoskeletal:     Comments: Noted generalized atrophy  Skin:    General: Skin is warm and dry.     Vital Signs: BP 98/80   Pulse 69   Temp 98.1 F (36.7 C)   Resp 14   Ht 5\' 6"  (1.676 m)   Wt 77.2 kg   SpO2 100%   BMI 27.47 kg/m  Pain Scale: CPOT       SpO2: SpO2: 100 % O2 Device:SpO2: 100 % O2 Flow Rate: .O2 Flow Rate (L/min): 3 L/min  IO: Intake/output summary:   Intake/Output Summary (Last 24 hours) at 06/03/2019 1020 Last data filed at 06/03/2019 [id number] Gross per 24 hour  Intake 2928.09 ml  Output 1875 ml  Net 1053.09 ml    LBM: Last BM Date: 06/02/19 Baseline Weight: Weight: 86 kg Most recent weight: Weight: 77.2 kg     Palliative Assessment/Data:    Discussed with Dr. Mortimer Fries  Time In: 1330 Time Out: 1445 Time Total: 75 minutes Greater than 50%  of this time was spent counseling and coordinating care related to the above assessment and plan.  Signed by: Wadie Lessen, NP   Please contact Palliative Medicine Team phone at [phone] for questions and concerns.  For individual provider: See Shea Evans

## 2019-06-03 NOTE — Progress Notes (Signed)
CRITICAL CARE NOTE BRIEF PATIENT DESCRIPTION:  69 yo female with alzheimer's dementia admitted with sepsis, acute renal failure, and acute encephalopathy  secondary to UTI in the setting of obstructing left ureteral calculus s/p cystoscopy and left ureteral stent placement remained mechanically intubated postop.  Noted to have acute GI bleed   SIGNIFICANT EVENTS/STUDIES:  04/26: Pt admitted to the stepdown unit with sepsis and acute metabolic encephalopathy  52/84: CT Renal Stone Study revealed there is moderate/severe left hydronephrosis and ureterectasis secondary to a 7 mm stone in the distal left ureter. Small bilateral pleural effusions. Scattered bandlike opacities in the bilateral lungs favored represent atelectasis. There is a more focal small opacity in the peripheral left lung which could reflect atelectasis though infection not excluded. Moderate wall thickening in the rectum which is nonspecific but could reflect chronic inflammation/proctitis. Colonoscopy should be considered when/if clinically appropriate. Multiple small lucencies throughout the right iliac bone, favored to represent intra osseous pneumatocysts, a clinically insignificant finding. Mild new wedge compression fracture at L1. No significant Listhesis. 04/26: Due to CT findings urology consulted and pt underwent emergent cystoscopy with left retrograde pyelogram and left ureteral stent placement.  She remained intubated postop PCCM team consulted for vent management 04/26: Pt noted to have watery maroon colored stool postop (according to pts niece this is new for the pt) CC  follow up respiratory failure  SUBJECTIVE Patient remains critically ill Prognosis is guarded   BP (!) 82/56   Pulse 65   Temp 97.8 F (36.6 C)   Resp 17   Ht '5\' 6"'$  (1.676 m)   Wt 77.2 kg   SpO2 100%   BMI 27.47 kg/m    I/O last 3 completed shifts: In: 2568.1 [I.V.:2025.8; IV Piggyback:542.3] Out: 1324 [Urine:1625] Total I/O In:  1042.2 [I.V.:582.2; Blood:360; IV Piggyback:100] Out: 250 [Urine:250]  SpO2: 100 % O2 Flow Rate (L/min): 3 L/min FiO2 (%): (S) 30 %  Estimated body mass index is 27.47 kg/m as calculated from the following:   Height as of this encounter: '5\' 6"'$  (1.676 m).   Weight as of this encounter: 77.2 kg.  SIGNIFICANT EVENTS   REVIEW OF SYSTEMS  PATIENT IS UNABLE TO PROVIDE COMPLETE REVIEW OF SYSTEMS DUE TO SEVERE CRITICAL ILLNESS        PHYSICAL EXAMINATION:  GENERAL:critically ill appearing, +resp distress HEAD: Normocephalic, atraumatic.  EYES: Pupils equal, round, reactive to light.  No scleral icterus.  MOUTH: Moist mucosal membrane. NECK: Supple.  PULMONARY: +rhonchi, +wheezing CARDIOVASCULAR: S1 and S2. Regular rate and rhythm. No murmurs, rubs, or gallops.  GASTROINTESTINAL: Soft, nontender, -distended.  Positive bowel sounds.   MUSCULOSKELETAL: No swelling, clubbing, or edema.  NEUROLOGIC: obtunded, GCS<8 SKIN:intact,warm,dry  MEDICATIONS: I have reviewed all medications and confirmed regimen as documented   CULTURE RESULTS   Recent Results (from the past 240 hour(s))  Respiratory Panel by RT PCR (Flu A&B, Covid) - Nasopharyngeal Swab     Status: None   Collection Time: 06/02/19 11:05 AM   Specimen: Nasopharyngeal Swab  Result Value Ref Range Status   SARS Coronavirus 2 by RT PCR NEGATIVE NEGATIVE Final    Comment: (NOTE) SARS-CoV-2 target nucleic acids are NOT DETECTED. The SARS-CoV-2 RNA is generally detectable in upper respiratoy specimens during the acute phase of infection. The lowest concentration of SARS-CoV-2 viral copies this assay can detect is 131 copies/mL. A negative result does not preclude SARS-Cov-2 infection and should not be used as the sole basis for treatment or other patient management decisions. A  negative result may occur with  improper specimen collection/handling, submission of specimen other than nasopharyngeal swab, presence of viral  mutation(s) within the areas targeted by this assay, and inadequate number of viral copies (<131 copies/mL). A negative result must be combined with clinical observations, patient history, and epidemiological information. The expected result is Negative. Fact Sheet for Patients:  PinkCheek.be Fact Sheet for Healthcare Providers:  GravelBags.it This test is not yet ap proved or cleared by the [place] FDA and  has been authorized for detection and/or diagnosis of SARS-CoV-2 by FDA under an Emergency Use Authorization (EUA). This EUA will remain  in effect (meaning this test can be used) for the duration of the COVID-19 declaration under Section 564(b)(1) of the Act, 21 U.S.C. section 360bbb-3(b)(1), unless the authorization is terminated or revoked sooner.    Influenza A by PCR NEGATIVE NEGATIVE Final   Influenza B by PCR NEGATIVE NEGATIVE Final    Comment: (NOTE) The Xpert Xpress SARS-CoV-2/FLU/RSV assay is intended as an aid in  the diagnosis of influenza from Nasopharyngeal swab specimens and  should not be used as a sole basis for treatment. Nasal washings and  aspirates are unacceptable for Xpert Xpress SARS-CoV-2/FLU/RSV  testing. Fact Sheet for Patients: PinkCheek.be Fact Sheet for Healthcare Providers: GravelBags.it This test is not yet approved or cleared by the [place] FDA and  has been authorized for detection and/or diagnosis of SARS-CoV-2 by  FDA under an Emergency Use Authorization (EUA). This EUA will remain  in effect (meaning this test can be used) for the duration of the  Covid-19 declaration under Section 564(b)(1) of the Act, 21  U.S.C. section 360bbb-3(b)(1), unless the authorization is  terminated or revoked. Performed at [facility], [address] [place], [place] [address]   Culture, blood (x 2)     Status: None  (Preliminary result)   Collection Time: 06/02/19  5:01 PM   Specimen: BLOOD  Result Value Ref Range Status   Specimen Description BLOOD RIGHT HAND  Final   Special Requests   Final    BOTTLES DRAWN AEROBIC ONLY Blood Culture adequate volume   Culture   Final    NO GROWTH < 12 HOURS Performed at [facility], [address] [place], [place] [address]    Report Status PENDING  Incomplete  Culture, blood (x 2)     Status: None (Preliminary result)   Collection Time: 06/02/19  5:01 PM   Specimen: BLOOD  Result Value Ref Range Status   Specimen Description BLOOD LEFT ASSIST CONTROL  Final   Special Requests   Final    BOTTLES DRAWN AEROBIC ONLY Blood Culture adequate volume   Culture   Final    NO GROWTH < 12 HOURS Performed at [facility], [address], [place], [place] [address]    Report Status PENDING  Incomplete  MRSA PCR Screening     Status: None   Collection Time: 06/02/19  6:35 PM   Specimen: Nasopharyngeal  Result Value Ref Range Status   MRSA by PCR NEGATIVE NEGATIVE Final    Comment:        The GeneXpert MRSA Assay (FDA approved for NASAL specimens only), is one component of a comprehensive MRSA colonization surveillance program. It is not intended to diagnose MRSA infection nor to guide or monitor treatment for MRSA infections. Performed at [facility], [place], [place] [address]   C Difficile Quick Screen w PCR reflex  Status: None   Collection Time: 06/02/19 10:43 PM   Specimen: STOOL  Result Value Ref Range Status   C Diff antigen NEGATIVE NEGATIVE Final   C Diff toxin NEGATIVE NEGATIVE Final   C Diff interpretation No C. difficile detected.  Final    Comment: Performed at [facility], [address] [place], [place] [address]          IMAGING    CT HEAD WO CONTRAST  Result Date: 06/02/2019 CLINICAL DATA:  Dementia.  Encephalopathy.  Mental status changes. EXAM: CT HEAD WITHOUT  CONTRAST TECHNIQUE: Contiguous axial images were obtained from the base of the skull through the vertex without intravenous contrast. COMPARISON:  09/10/2017 FINDINGS: Brain: Advanced generalized brain atrophy, progressive since the previous study. Extensive chronic small-vessel ischemic changes throughout the cerebral hemispheric white matter. No sign of acute infarction, mass lesion or hemorrhage. Ventricular size is considerably increased since the previous study, but seemingly in proportion to the progressive generalized atrophy. Possibility of normal pressure hydrocephalus is not excluded however. Vascular: There is atherosclerotic calcification of the major vessels at the base of the brain. Skull: Negative Sinuses/Orbits: Clear/normal Other: None IMPRESSION: Since 2019, there has been progressive generalized brain atrophy and worsening of chronic small vessel ischemic change throughout the brain. Ventricles are larger, which I presume relates to ex vacuo enlargement from the worsened atrophy. I cannot completely rule out the possibility of normal pressure hydrocephalus. There is no acute stroke or intracranial hemorrhage evident by CT. Electronically Signed   By: Nelson Chimes M.D.   On: 06/02/2019 16:48   US RENAL  Result Date: 06/02/2019 CLINICAL DATA:  Acute kidney injury EXAM: RENAL / URINARY TRACT ULTRASOUND COMPLETE COMPARISON:  Ultrasound 10/26/2017 FINDINGS: Right Kidney: Renal measurements: 10.8 x 3.9 x 4.3 cm = volume: 97 mL . Echogenicity within normal limits. No mass or hydronephrosis visualized. Left Kidney: Renal measurements: 10.8 x 6.1 x 4.5 cm = volume: 156 mL. Cortical echogenicity within normal limits. Moderate hydronephrosis. Echogenic foci within dilated left renal pelvis, potentially stones, these measure up to 12 mm. Visualized proximal ureter is also dilated. Bladder: Foley catheter in the bladder which is empty Other: None. IMPRESSION: 1. Moderate left hydronephrosis with proximal  hydroureter. Echogenic foci within dilated left renal pelvis, probable stones. CT KUB suggested for further evaluation and to assess for distal obstruction of the left kidney 2. Normal ultrasound appearance of the right kidney Electronically Signed   By: Donavan Foil M.D.   On: 06/02/2019 17:39   DG Chest Port 1 View  Result Date: 06/02/2019 CLINICAL DATA:  Endotracheally intubated. EXAM: PORTABLE CHEST 1 VIEW COMPARISON:  Earlier this day. FINDINGS: Placement of endotracheal tube with tip at the level of the clavicular heads 5.2 cm from the carina. Persistent low lung volumes with streaky bibasilar opacities. Trace pleural effusions. Normal heart size and mediastinal contours for degree of rotation. No pulmonary edema or pneumothorax. No acute osseous abnormalities are seen. IMPRESSION: 1. Placement of endotracheal tube with tip at the level of the clavicular heads 5.2 cm from the carina. 2. Persistent low lung volumes with streaky bibasilar opacities, favor atelectasis. Electronically Signed   By: Keith Rake M.D.   On: 06/02/2019 22:55   CT RENAL STONE STUDY  Result Date: 06/02/2019 CLINICAL DATA:  Altered mental status. Oxygen desaturation. Recurrent UTI. Indwelling Foley catheter. EXAM: CT ABDOMEN AND PELVIS WITHOUT CONTRAST TECHNIQUE: Multidetector CT imaging of the abdomen and pelvis was performed following the standard protocol without IV contrast.  COMPARISON:  CT abdomen pelvis 04/04/2017 FINDINGS: Lower chest: Small bilateral pleural effusions. There are scattered bandlike opacities in the bilateral lungs favored represent atelectasis. There is a more focal small opacity in the peripheral left lung (series 6, image 6) which could reflect atelectasis though infection not excluded. Hepatobiliary: No focal liver abnormality is seen. Unremarkable gallbladder. Pancreas: Unremarkable. No surrounding inflammatory changes. Spleen: Normal in size without focal abnormality. Adrenals/Urinary Tract:  Adrenal glands are unremarkable. The left kidney is enlarged. There is moderate/severe left hydronephrosis and ureterectasis secondary to a stone in the distal left ureter measuring 7 mm. The right kidney is unremarkable. The urinary bladder is decompressed with a Foley catheter in place. Stomach/Bowel: Stomach is within normal limits. Appendix appears normal. There is moderate wall thickening in the rectum which is nonspecific but could reflect chronic inflammation. No evidence of obstruction. There are scattered colonic diverticula without evidence of diverticulitis. Vascular/Lymphatic: Moderate atherosclerotic disease of the abdominal aorta without aneurysm. Vascular patency cannot be assessed in the absence IV contrast. No lymphadenopathy. Reproductive: No adnexal masses. Other: No abdominal wall hernia or abnormality. No abdominopelvic ascites. Musculoskeletal: There is a mild new wedge compression fracture at L1. No significant listhesis. Multilevel degenerative disc disease throughout the lumbar spine. There are multiple lucencies throughout the right iliac bone. IMPRESSION: 1. There is moderate/severe left hydronephrosis and ureterectasis secondary to a 7 mm stone in the distal left ureter. 2. Small bilateral pleural effusions. Scattered bandlike opacities in the bilateral lungs favored represent atelectasis. There is a more focal small opacity in the peripheral left lung which could reflect atelectasis though infection not excluded. 3. Moderate wall thickening in the rectum which is nonspecific but could reflect chronic inflammation/proctitis. Colonoscopy should be considered when/if clinically appropriate. 4. Multiple small lucencies throughout the right iliac bone, favored to represent intra osseous pneumatocysts, a clinically insignificant finding. 5. Mild new wedge compression fracture at L1. No significant listhesis. Aortic Atherosclerosis (ICD10-I70.0). Electronically Signed   By: Audie Pinto  M.D.   On: 06/02/2019 20:48     Nutrition Status:           Indwelling Urinary Catheter continued, requirement due to   Reason to continue Indwelling Urinary Catheter strict Intake/Output monitoring for hemodynamic instability         Ventilator continued, requirement due to severe respiratory failure   Ventilator Sedation RASS 0 to -2      ASSESSMENT AND PLAN SYNOPSIS Severe ACUTE Hypoxic and Hypercapnic Respiratory Failure POST op sepsis from Obstructed and infected Ureteral stone  Severe ACUTE Hypoxic and Hypercapnic Respiratory Failure -continue Full MV support -continue Bronchodilator Therapy -Wean Fio2 and PEEP as tolerated -will perform SAT/SBT when respiratory parameters are met -VAP/VENT bundle implementation   ACUTE KIDNEY INJURY/Renal Failure -follow chem 7 -follow UO -continue Foley Catheter-assess need -Avoid nephrotoxic agents -Recheck creatinine    NEUROLOGY obtunded   SHOCK-SEPSIS/HYPOVOLUMIC/CARDIOGENIC -follow ABG and LA -follow up cultures -emperic ABX -consider stress dose steroids -aggressive IV fluid resuscitation  CARDIAC ICU monitoring  ID -continue IV abx as prescibed -follow up cultures  GI GI PROPHYLAXIS as indicated  NUTRITIONAL STATUS Nutrition Status:         DIET-->NPO Constipation protocol as indicated  ENDO - will use ICU hypoglycemic\Hyperglycemia protocol if indicated   ELECTROLYTES -follow labs as needed -replace as needed -pharmacy consultation and following   DVT/GI PRX ordered TRANSFUSIONS AS NEEDED MONITOR FSBS ASSESS the need for LABS as needed   Critical Care Time devoted to patient care services  described in this note is 35 minutes.   Overall, patient is critically ill, prognosis is guarded.  Patient with Multiorgan failure and at high risk for cardiac arrest and death.   Very Poor prognosis, patient with very poor chance of meaningful recovery  Corrin Parker, M.D.  [facility]  Pulmonary & Critical Care Medicine  Medical Director [place] Director [facility] Cardio-Pulmonary Department

## 2019-06-03 NOTE — Consult Note (Signed)
Cardiology Consultation:   Patient ID: Jamie Boyd MRN: [id number]; DOB: 1950/03/07  Admit date: 06/02/2019 Date of Consult: 06/03/2019  Primary Care Provider: Sharyne Peach, MD Primary Cardiologist: [facility], Dr. Saunders Revel rounding Primary Electrophysiologist:  None   Patient Profile:   Jamie Boyd is a 69 y.o. female with a hx of Alzheimer's dementia, recurrent UTIs with recent obstructing left ureteral calculus s/p cytoscopy and left ureteral stent placement, acute renal failure, HLD, h/o CVA, goiter, blood loss anemia, and who is being seen today for the evaluation of new onset atrial fibrillation with RVR at the request of Dr. Blaine Hamper.  History of Present Illness:   [name] is a 70 year old female with PMH as above and including Alzheimer's dementia and thus a very limited history was obtained from the patient's niece, who was present in the room at the time of cardiac consultation.    She has no known previous history of cardiac disease or arrhythmia.  Per the patient's niece, [name] has been declining for some time over and with rapid decline within the last few days. Prior to her most recent admission, she had stopped eating as well.  She presented to [facility] ED via EMS from home hospice 06/02/2019 and with decreased responsiveness noted. When first presenting to the ED, she was noted to be awake but unable to follow commands. Oxygen saturations were 88% on 2L of oxygen and increasing to the mid 90s on 4L. COVID-19 and Influenza negative. CXR showed atelectasis versus bilateral opacities. Initial EKG showed NSR without acute ST/T changes. Subsequent ED EKG showed atrial flutter versus AVNRT with telemetry reportedly (per EMR) showing atrial fibrillation and rates into the 200s. She was started on IV diltiazem gtt with subsequent conversion back to NSR.   She was admitted with sepsis and AMS 2/2 acute metabolic encephalopathy in the setting of UTI with known history of recurrent UTIs. She  was started on abx and IVF. Due to findings on her CT renal stone study, she then underwent same day emergent cytoscopy and left ureteral stent placement same day and has remained mechanically intubated post op since that time.   Of note, she was also found to also have severe anemia with most recent labs showing Hgb 6.8 with RBC 2.37 2/2 GI bleed.  Cardiology has been consulted for Afib with RVR from ED EKG. On telemetry today, she remains in NSR with rates in the 60s.   Heart Pathway Score:     Past Medical History:  Diagnosis Date  . Alzheimer disease ([place])   . Anemia   . Dementia ([place])   . Goiter   . Memory deficit   . Parkinson's disease ([place])   . Urinary tract infection, site not specified     Past Surgical History:  Procedure Laterality Date  . COLONOSCOPY WITH PROPOFOL N/A 02/18/2015   Procedure: COLONOSCOPY WITH PROPOFOL;  Surgeon: Hulen Luster, MD;  Location: [facility] ENDOSCOPY;  Service: Gastroenterology;  Laterality: N/A;  . ESOPHAGOGASTRODUODENOSCOPY N/A 02/18/2015   Procedure: ESOPHAGOGASTRODUODENOSCOPY (EGD);  Surgeon: Hulen Luster, MD;  Location: [facility] ENDOSCOPY;  Service: Gastroenterology;  Laterality: N/A;     Home Medications:  Prior to Admission medications   Medication Sig Start Date End Date Taking? Authorizing Provider  Acetaminophen 500 MG capsule Take 1,000 mg by mouth every 12 (twelve) hours as needed for pain.    Yes [provider]  albuterol (PROVENTIL) (2.5 MG/3ML) 0.083% nebulizer solution Take 2.5 mg by nebulization every 4 (four) hours as  needed for wheezing or shortness of breath.   Yes [provider]  aspirin EC 81 MG tablet Take 81 mg by mouth daily.   Yes [provider]  bisacodyl (DULCOLAX) 10 MG suppository Place 10 mg rectally daily as needed for moderate constipation.   Yes [provider]  divalproex (DEPAKOTE SPRINKLE) 125 MG capsule Take 125 mg by mouth 3 (three) times daily.    Yes [provider]    fexofenadine (ALLEGRA) 180 MG tablet Take 180 mg by mouth daily.   Yes [provider]  furosemide (LASIX) 20 MG tablet Take by mouth. 06/02/19 06/01/20 Yes [provider]  hydrochlorothiazide (MICROZIDE) 12.5 MG capsule TAKE 1 CAPSULE BY MOUTH ONCE DAILY 08/27/18  Yes [provider]  ibuprofen (ADVIL) 400 MG tablet Take 400 mg by mouth 3 (three) times daily as needed for fever or mild pain.    Yes [provider]  LORazepam (ATIVAN) 0.5 MG tablet Take 0.5 mg by mouth every 4 (four) hours as needed (anxiety, agitation, restlessness or nausea).    Yes [provider]  montelukast (SINGULAIR) 10 MG tablet Take 10 mg by mouth at bedtime.   Yes [provider]  Morphine Sulfate (MORPHINE CONCENTRATE) 10 mg / 0.5 ml concentrated solution Take 5 mg by mouth every hour as needed for severe pain or shortness of breath.    Yes [provider]  nystatin (NYSTATIN) powder Apply 1 application topically 2 (two) times daily as needed (skin irritation).   Yes [provider]  omeprazole (PRILOSEC) 20 MG capsule Take 20 mg by mouth daily.    Yes [provider]  oxybutynin (DITROPAN) 5 MG tablet Take 5 mg by mouth 2 (two) times daily.   Yes [provider]  oxyCODONE (OXY IR/ROXICODONE) 5 MG immediate release tablet Take 2.5-5 mg by mouth every 4 (four) hours as needed for severe pain or breakthrough pain.   Yes [provider]  phenylephrine-shark liver oil-mineral oil-petrolatum (PREPARATION H) 0.25-3-14-71.9 % rectal ointment Place 1 application rectally 4 (four) times daily as needed for hemorrhoids.   Yes [provider]  QUEtiapine (SEROQUEL) 100 MG tablet Take 100 mg by mouth at bedtime.   Yes [provider]  QUEtiapine (SEROQUEL) 25 MG tablet Take 25 mg by mouth daily as needed (awaking overnight).   Yes [provider]  senna-docusate (SENOKOT-S) 8.6-50 MG tablet Take 1-3 tablets by  mouth 3 (three) times daily as needed for mild constipation.   Yes [provider]  sennosides-docusate sodium (SENOKOT-S) 8.6-50 MG tablet Take 2 tablets by mouth daily.   Yes [provider]  traZODone (DESYREL) 100 MG tablet Take 100 mg by mouth at bedtime.   Yes [provider]  witch hazel-glycerin (TUCKS) pad Apply 1 application topically as needed for itching or irritation.   Yes [provider]  memantine (NAMENDA) 10 MG tablet Take 10 mg by mouth 2 (two) times daily.  11/23/15 11/27/17  [provider]    Inpatient Medications: Scheduled Meds: . Chlorhexidine Gluconate Cloth  6 each Topical Daily  . docusate  100 mg Oral BID  . insulin aspart  0-9 Units Subcutaneous Q4H  . ipratropium  0.5 mg Nebulization Q6H  . pantoprazole (PROTONIX) IV  40 mg Intravenous Q12H  . polyethylene glycol  17 g Oral Daily   Continuous Infusions: . sodium chloride Stopped (06/03/19 0352)  . dexmedetomidine (PRECEDEX) IV infusion 0.5 mcg/kg/hr (06/03/19 [id number])  . dextrose 75 mL/hr at  06/03/19 [id number]  . diltiazem (CARDIZEM) infusion Stopped (06/02/19 1623)  . fentaNYL infusion INTRAVENOUS    . meropenem (MERREM) IV Stopped (06/03/19 0013)  . phenylephrine (NEO-SYNEPHRINE) Adult infusion Stopped (06/02/19 2254)   PRN Meds: albuterol, midazolam, ondansetron (ZOFRAN) IV  Allergies:    Allergies  Allergen Reactions  . Pollen Extract Other (See Comments)    Social History:   Social History   Socioeconomic History  . Marital status: Divorced    Spouse name: Not on file  . Number of children: Not on file  . Years of education: Not on file  . Highest education level: Not on file  Occupational History  . Not on file  Tobacco Use  . Smoking status: Former Smoker    Types: Cigarettes    Quit date: 01/25/2014    Years since quitting: 5.3  . Smokeless tobacco: Never Used  Substance and Sexual Activity  . Alcohol use: No  . Drug use: No  . Sexual  activity: Not Currently  Other Topics Concern  . Not on file  Social History Narrative   Lives at home with family. Ambulates with minimal assistance at baseline       Social Determinants of Health   Financial Resource Strain:   . Difficulty of Paying Living Expenses:   Food Insecurity:   . Worried About Charity fundraiser in the Last Year:   . Arboriculturist in the Last Year:   Transportation Needs:   . Film/video editor (Medical):   Marland Kitchen Lack of Transportation (Non-Medical):   Physical Activity:   . Days of Exercise per Week:   . Minutes of Exercise per Session:   Stress:   . Feeling of Stress :   Social Connections:   . Frequency of Communication with Friends and Family:   . Frequency of Social Gatherings with Friends and Family:   . Attends Religious Services:   . Active Member of Clubs or Organizations:   . Attends Archivist Meetings:   Marland Kitchen Marital Status:   Intimate Partner Violence:   . Fear of Current or Ex-Partner:   . Emotionally Abused:   Marland Kitchen Physically Abused:   . Sexually Abused:     Family History:    Family History  Problem Relation Age of Onset  . Diabetes Maternal Uncle   . Prostate cancer Neg Hx   . Bladder Cancer Neg Hx   . Kidney cancer Neg Hx      ROS:  Please see the history of present illness.  Review of Systems  Unable to perform ROS: Acuity of condition    All other ROS reviewed and negative.     Physical Exam/Data:   Vitals:   06/03/19 0400 06/03/19 0500 06/03/19 0600 06/03/19 0702  BP: 105/73 (!) 80/50 (!) 82/54 (!) 94/59  Pulse:  83    Resp: (!) 21 15 (!) 21 13  Temp:    (!) 97.5 F (36.4 C)  TempSrc:      SpO2:  100%  99%  Weight:      Height:        Intake/Output Summary (Last 24 hours) at 06/03/2019 0725 Last data filed at 06/03/2019 [id number] Gross per 24 hour  Intake 2568.09 ml  Output 1625 ml  Net 943.09 ml   Last 3 Weights 06/02/2019 06/02/2019 03/07/2019  Weight (lbs) 170 lb 3.1 oz 189 lb 9.5 oz 190 lb    Weight (kg) 77.2 kg 86 kg 86.183 kg  Body mass index is 27.47 kg/m.  General:  Intubated and sedated HEENT: Intubated  Neck: JVD difficult to assess as intubated Vascular: radial pulses 1+ bilaterally Cardiac:  normal S1, S2; RRR; no murmur  Lungs:  Anterior ausculation revealed coarse b/l breath sounds 2/2 intubation, no wheezing, rhonchi or rales  Abd: soft, nontender, no hepatomegaly  Ext: no edema Musculoskeletal:  No deformities, BUE and BLE strength normal and equal Skin: warm and dry  Neuro:  No focal abnormalities noted Psych:  Normal affect   EKG:  The EKG was personally reviewed and demonstrates: Initial EKG showed NSR, 70 bpm, baseline wander. Subsequent EKG showed 2:1 atrial flutter versus AVNRT at 179bpm. Telemetry:  Telemetry was personally reviewed and demonstrates:ICU telemetry shows NSR   Relevant CV Studies: 01/25/2017 Echo - Left ventricle: The cavity size was normal. Wall thickness was  normal. Systolic function was normal. The estimated ejection  fraction was in the range of 60% to 65%. Wall motion was normal;  there were no regional wall motion abnormalities. Left  ventricular diastolic function parameters were normal.  - Atrial septum: Echo contrast study showed no right-to-left atrial  level shunt, at baseline or with provocation.  Impressions:  - No cardiac source of emboli was indentified.   Laboratory Data:  High Sensitivity Troponin:   Recent Labs  Lab 06/02/19 1040  TROPONINIHS 14     Cardiac EnzymesNo results for input(s): TROPONINI in the last 168 hours. No results for input(s): TROPIPOC in the last 168 hours.  Chemistry Recent Labs  Lab 06/02/19 1040 06/03/19 0039  NA 143 157*  K 4.8 3.9  CL 111 119*  CO2 19* 29  GLUCOSE 137* 144*  BUN 139* 105*  CREATININE 3.55* 2.49*  CALCIUM 8.6* 7.0*  GFRNONAA 12* 19*  GFRAA 14* 22*  ANIONGAP 13 9    Recent Labs  Lab 06/02/19 1040  PROT 7.5  ALBUMIN 2.0*  AST 104*  ALT  94*  ALKPHOS 245*  BILITOT 8.3*   Hematology Recent Labs  Lab 06/02/19 1040 06/03/19 0039  WBC 20.0* 16.9*  RBC 3.47* 2.37*  HGB 9.9* 6.8*  HCT 26.9* 18.6*  MCV 77.5* 78.5*  MCH 28.5 28.7  MCHC 36.8* 36.6*  RDW 14.8 15.0  PLT 69* 52*   BNPNo results for input(s): BNP, PROBNP in the last 168 hours.  DDimer No results for input(s): DDIMER in the last 168 hours.   Radiology/Studies:  CT HEAD WO CONTRAST  Result Date: 06/02/2019 CLINICAL DATA:  Dementia.  Encephalopathy.  Mental status changes. EXAM: CT HEAD WITHOUT CONTRAST TECHNIQUE: Contiguous axial images were obtained from the base of the skull through the vertex without intravenous contrast. COMPARISON:  09/10/2017 FINDINGS: Brain: Advanced generalized brain atrophy, progressive since the previous study. Extensive chronic small-vessel ischemic changes throughout the cerebral hemispheric white matter. No sign of acute infarction, mass lesion or hemorrhage. Ventricular size is considerably increased since the previous study, but seemingly in proportion to the progressive generalized atrophy. Possibility of normal pressure hydrocephalus is not excluded however. Vascular: There is atherosclerotic calcification of the major vessels at the base of the brain. Skull: Negative Sinuses/Orbits: Clear/normal Other: None IMPRESSION: Since 2019, there has been progressive generalized brain atrophy and worsening of chronic small vessel ischemic change throughout the brain. Ventricles are larger, which I presume relates to ex vacuo enlargement from the worsened atrophy. I cannot completely rule out the possibility of normal pressure hydrocephalus. There is no acute stroke or intracranial hemorrhage evident by CT. Electronically Signed  By: Nelson Chimes M.D.   On: 06/02/2019 16:48   US RENAL  Result Date: 06/02/2019 CLINICAL DATA:  Acute kidney injury EXAM: RENAL / URINARY TRACT ULTRASOUND COMPLETE COMPARISON:  Ultrasound 10/26/2017 FINDINGS: Right  Kidney: Renal measurements: 10.8 x 3.9 x 4.3 cm = volume: 97 mL . Echogenicity within normal limits. No mass or hydronephrosis visualized. Left Kidney: Renal measurements: 10.8 x 6.1 x 4.5 cm = volume: 156 mL. Cortical echogenicity within normal limits. Moderate hydronephrosis. Echogenic foci within dilated left renal pelvis, potentially stones, these measure up to 12 mm. Visualized proximal ureter is also dilated. Bladder: Foley catheter in the bladder which is empty Other: None. IMPRESSION: 1. Moderate left hydronephrosis with proximal hydroureter. Echogenic foci within dilated left renal pelvis, probable stones. CT KUB suggested for further evaluation and to assess for distal obstruction of the left kidney 2. Normal ultrasound appearance of the right kidney Electronically Signed   By: Donavan Foil M.D.   On: 06/02/2019 17:39   DG Chest Port 1 View  Result Date: 06/02/2019 CLINICAL DATA:  Endotracheally intubated. EXAM: PORTABLE CHEST 1 VIEW COMPARISON:  Earlier this day. FINDINGS: Placement of endotracheal tube with tip at the level of the clavicular heads 5.2 cm from the carina. Persistent low lung volumes with streaky bibasilar opacities. Trace pleural effusions. Normal heart size and mediastinal contours for degree of rotation. No pulmonary edema or pneumothorax. No acute osseous abnormalities are seen. IMPRESSION: 1. Placement of endotracheal tube with tip at the level of the clavicular heads 5.2 cm from the carina. 2. Persistent low lung volumes with streaky bibasilar opacities, favor atelectasis. Electronically Signed   By: Keith Rake M.D.   On: 06/02/2019 22:55   DG Chest Portable 1 View  Result Date: 06/02/2019 CLINICAL DATA:  Hypoxia EXAM: PORTABLE CHEST 1 VIEW COMPARISON:  03/07/2019 FINDINGS: Low lung volumes. Bibasilar opacities. No significant pleural effusion. No pneumothorax. Stable cardiomediastinal contours. IMPRESSION: Bibasilar opacities favored to reflect atelectasis.  Electronically Signed   By: Macy Mis M.D.   On: 06/02/2019 10:57   CT RENAL STONE STUDY  Result Date: 06/02/2019 CLINICAL DATA:  Altered mental status. Oxygen desaturation. Recurrent UTI. Indwelling Foley catheter. EXAM: CT ABDOMEN AND PELVIS WITHOUT CONTRAST TECHNIQUE: Multidetector CT imaging of the abdomen and pelvis was performed following the standard protocol without IV contrast. COMPARISON:  CT abdomen pelvis 04/04/2017 FINDINGS: Lower chest: Small bilateral pleural effusions. There are scattered bandlike opacities in the bilateral lungs favored represent atelectasis. There is a more focal small opacity in the peripheral left lung (series 6, image 6) which could reflect atelectasis though infection not excluded. Hepatobiliary: No focal liver abnormality is seen. Unremarkable gallbladder. Pancreas: Unremarkable. No surrounding inflammatory changes. Spleen: Normal in size without focal abnormality. Adrenals/Urinary Tract: Adrenal glands are unremarkable. The left kidney is enlarged. There is moderate/severe left hydronephrosis and ureterectasis secondary to a stone in the distal left ureter measuring 7 mm. The right kidney is unremarkable. The urinary bladder is decompressed with a Foley catheter in place. Stomach/Bowel: Stomach is within normal limits. Appendix appears normal. There is moderate wall thickening in the rectum which is nonspecific but could reflect chronic inflammation. No evidence of obstruction. There are scattered colonic diverticula without evidence of diverticulitis. Vascular/Lymphatic: Moderate atherosclerotic disease of the abdominal aorta without aneurysm. Vascular patency cannot be assessed in the absence IV contrast. No lymphadenopathy. Reproductive: No adnexal masses. Other: No abdominal wall hernia or abnormality. No abdominopelvic ascites. Musculoskeletal: There is a mild new wedge compression fracture at  L1. No significant listhesis. Multilevel degenerative disc disease  throughout the lumbar spine. There are multiple lucencies throughout the right iliac bone. IMPRESSION: 1. There is moderate/severe left hydronephrosis and ureterectasis secondary to a 7 mm stone in the distal left ureter. 2. Small bilateral pleural effusions. Scattered bandlike opacities in the bilateral lungs favored represent atelectasis. There is a more focal small opacity in the peripheral left lung which could reflect atelectasis though infection not excluded. 3. Moderate wall thickening in the rectum which is nonspecific but could reflect chronic inflammation/proctitis. Colonoscopy should be considered when/if clinically appropriate. 4. Multiple small lucencies throughout the right iliac bone, favored to represent intra osseous pneumatocysts, a clinically insignificant finding. 5. Mild new wedge compression fracture at L1. No significant listhesis. Aortic Atherosclerosis (ICD10-I70.0). Electronically Signed   By: Audie Pinto M.D.   On: 06/02/2019 20:48    Assessment and Plan:   New Onset Atrial Flutter versus AVNRT --Now in NSR.  As above, she presented in NSR and reportedly had a brief run of SVT with EMR noting atrial fibrillation on telemetry and subsequent EKG suspicious for atrial flutter versus AVNRT.  She started on IV diltiazem with conversion back to NSR.  --Consider tachycardia as triggered 2/2 infection. --Rates are well controlled.  If she converts back to atrial fibrillation/flutter, could consider IV amiodarone for further rate and rhythm control.  Would caution against use of IV diltiazem given unknown EF. --Continue to monitor electrolytes and renal function.  4/27 TSH 3.339. --Previous echo as above. With nl EF.  Pending repeat echocardiogram to assess EF and rule out acute structural abnormalities. --Would recommend against anticoagulation given her severe blood loss anemia with thrombocytopenia. CHA2DS2VASc score of at least 5 (age, DM, strokex2, female). --Further  recommendations, if needed, pending echocardiogram. Overall patient outlook poor.  Continue to monitor on telemetry.  Acute blood loss anemia secondary to acute GI bleed Thrombocytopenia and elevated LFTs --Daily CBC.  As above, would not recommend anticoagulation at this time due to her acute blood loss anemia and thrombocytopenia.  Continue to monitor.  Transfuse for hemoglobin below 7.  Per review of EMR, occult stool results pending.  Per CCM.  ARF --Daily BMET. Monitor electrolytes. Further recommendation per nephrology and CCM.   Remainder per IM  For questions or updates, please contact [place] Please consult [email or web address] for contact info under     Signed, Arvil Chaco, PA-C  06/03/2019 7:25 AM

## 2019-06-03 NOTE — Progress Notes (Signed)
[facility] Room IC09 [facility]) Hospitalized Hospice patient RN note  Jamie Boyd is a current hospice patient with a terminal diagnosis of Parkinson's disease. Home hospice case manager visited with patient and family the day of admission for concerns of decreased responsiveness and patient not eating or drinking. Family requested patient to be transferred to the emergency department for evaluation. Patient does have an OOF DNR and MOST Form. Patient was admitted to [facility] on 4.26.21 with a diagnosis of acute metabolic encephalopathy. Per Dr. Gilford Rile with [facility], this is a related hospital admission.   Visited patient and niece, Jamie Boyd in the room. Patient had cystoscopy with left retrograde pyelogram and left ureteral stent placement last evening for left ureteral calculus with sepsis secondary to urinary tract infection. Patient remains on ventilator this morning. She opens eyes spontaneously but does not track nor follow commands. She does appear comfortable. Jamie Boyd shares events of evening and that the family maintains they wish the patient to be supported medically for 3 days before any decision are made. Emotional support provided.  V/S: 98.4, 83/46, 63, 19, 100% on 35% FiO2, rate 18 I & O: 2568/1625 Abnormal Labs:   Ref. Range 06/03/2019 [id number]  BASIC METABOLIC PANEL Unknown Rpt (A)  Sodium Latest Ref Range: 135 - 145 mmol/L 157 (H)  Chloride Latest Ref Range: 98 - 111 mmol/L 119 (H)  Glucose Latest Ref Range: 70 - 99 mg/dL 144 (H)  BUN Latest Ref Range: 8 - 23 mg/dL 105 (H)  Creatinine Latest Ref Range: 0.44 - 1.00 mg/dL 2.49 (H)  Calcium Latest Ref Range: 8.9 - 10.3 mg/dL 7.0 (L)  Phosphorus Latest Ref Range: 2.5 - 4.6 mg/dL 5.3 (H)  Magnesium Latest Ref Range: 1.7 - 2.4 mg/dL 2.9 (H)  GFR, Est Non African American Latest Ref Range: >60 mL/min 19 (L)  GFR, Est African American Latest Ref Range: >60 mL/min 22 (L)  WBC Latest Ref Range: 4.0 - 10.5 K/uL 16.9 (H)  RBC  Latest Ref Range: 3.87 - 5.11 MIL/uL 2.37 (L)  Hemoglobin Latest Ref Range: 12.0 - 15.0 g/dL 6.8 (L)  HCT Latest Ref Range: 36.0 - 46.0 % 18.6 (L)  MCV Latest Ref Range: 80.0 - 100.0 fL 78.5 (L)  MCHC Latest Ref Range: 30.0 - 36.0 g/dL 36.6 (H)  Platelets Latest Ref Range: 150 - 400 K/uL 52 (L)  NEUT# Latest Ref Range: 1.7 - 7.7 K/uL 14.3 (H)  Abs Immature Granulocytes Latest Ref Range: 0.00 - 0.07 K/uL 0.80 (H)  Dimorphism Unknown PRESENT  Ovalocytes Unknown PRESENT  Polychromasia Unknown PRESENT  Spherocytes Unknown PRESENT  Target Cells Unknown PRESENT  Tear Drop Cells Unknown PRESENT  WBC Morphology Unknown MILD LEFT SHIFT (1-5% METAS, OCC MYELO, OCC BANDS)  Prothrombin Time Latest Ref Range: 11.4 - 15.2 seconds 17.4 (H)  INR Latest Ref Range: 0.8 - 1.2  1.4 (H)  Hemoglobin A1C Latest Ref Range: 4.8 - 5.6 % 6.5 (H)  ABO/RH(D) Unknown AB POS...   Diagnostics:  Chest Xray 4.26.21  IMPRESSION: Bibasilar opacities favored to reflect atelectasis.  Renal Ultrasound 4.26.21 IMPRESSION: 1. Moderate left hydronephrosis with proximal hydroureter. Echogenic foci within dilated left renal pelvis, probable stones. CT KUB suggested for further evaluation and to assess for distal obstruction of the left Boyd 2. Normal ultrasound appearance of the right Boyd  CT Renal Stone Study 4.26.21 IMPRESSION: 1. There is moderate/severe left hydronephrosis and ureterectasis secondary to a 7 mm stone in the distal left ureter. 2. Small bilateral pleural effusions. Scattered  bandlike opacities in the bilateral lungs favored represent atelectasis. There is a more focal small opacity in the peripheral left lung which could reflect atelectasis though infection not excluded. 3. Moderate wall thickening in the rectum which is nonspecific but could reflect chronic inflammation/proctitis. Colonoscopy should be considered when/if clinically appropriate. 4. Multiple small lucencies throughout the  right iliac bone, favored to represent intra osseous pneumatocysts, a clinically insignificant Finding. Aortic Atherosclerosis.  CT Head 4.26.21 IMPRESSION: Since 2019, there has been progressive generalized brain atrophy and worsening of chronic small vessel ischemic change throughout the brain. Ventricles are larger, which I presume relates to ex vacuo enlargement from the worsened atrophy. I cannot completely rule out the possibility of normal pressure hydrocephalus. There is no acute stroke or intracranial hemorrhage evident by CT. 5. Mild new wedge compression fracture at L1. No significant listhesis.  IV's/PRN: dextrose 5 % solution @ 100 cc/hr, Rocephin 2 Gm IV daily, Cardizem titrated 5-15 mg/hr (discontinued 4.26.21 at 1518), meropenem 1 G every 12 hours, propofol titration, Vancomycin 1750 mg X 2 doses 4.26.21,albuterol nebulizer prn -- 2 doses given 4.26.21, Versed 1 mg prn Q 2 hours sedation -- 2 doses given 4.27.21.  Problem List:  Assessment/Plan Principal Problem:   Acute metabolic encephalopathy Active Problems:   GERD without esophagitis   Iron deficiency anemia due to chronic blood loss   CVA (cerebral vascular accident) ([place])   AKI (acute Boyd injury) ([place])   Alzheimer disease ([place])   HLD (hyperlipidemia)   Atrial fibrillation with RVR ([place])   Abnormal LFTs   Thrombocytopenia ([facility])   Sepsis ([place])   Acute metabolic encephalopathy: Etiology is not clear, likely due to multifactorial etiology, including sepsis, A. fib with RVR, AKI. -Admitted to stepdown bed as inpatient -Frequent neuro check -Follow-up CT head -hold oral med and keep NPO until mental status improves  Sepsis: Patient meets criteria for sepsis with leukocytosis, WBC 20.  Tachycardia.  Soft blood pressure, blood pressure down to 86/65.  Source of infection is not clear.  Urinalysis not impressive.  Chest x-ray did not show obvious infiltration. -Started vancomycin and meropenem  empirically -For blood culture and urine culture -will get Procalcitonin and trend lactic acid levels per sepsis protocol. -IVF: 2L of NS bolus in ED, followed by 75 cc/h   GERD without esophagitis: -IV pecide  Iron deficiency anemia due to chronic blood loss -hold iron supplement until mental status improves  CVA (cerebral vascular accident) ([place]) -hold ASA and lipitor  AKI (acute Boyd injury) ([place]): renal US showed moderate left hydronephrosis with proximal hydroureter. Echogenic foci within dilated left renal pelvis, probable stones. -urology, Dr. Gloriann Loan is consulted --> stat CT per renal stone study protocol per Dr. Rush Landmark -renal, Dr. Juleen China is consulted -Ibuprofen, Lasix and HCTZ on hold  Alzheimer disease [facility]): -hold home meds  HLD (hyperlipidemia) -hold lipitor  New onset Atrial fibrillation with RVR ([place]): heart rate up to 200s. Trop 14.  -start IV cardizem gtt -consulted card, Dr. Fletcher Anon (message sent to Dr. Fletcher Anon).  Abnormal LFTs -check hepatitis panel and HIV antibody -Avoid using Tylenol  Thrombocytopenia ([place]): Likely due to sepsis -Check LDH and peripheral smear  Discharge Planning: Ongoing Family Contact: spoke with niece Jamie Boyd at bedside IDT: updated Goals of Care: To be determined. Family wishes to give patient opportunity to see how she responds.  Medication List and Transfer Summary placed on shadow chart.  Please call with any hospice related questions or concerns.  Thank you. Margaretmary Eddy, BSN, RN [facility]  Hospital Liaison [phone]

## 2019-06-03 NOTE — Consult Note (Signed)
Jamie Boyd , MD [address], [place], [place], [place], [address] [address], [place], [place], [place], [address] Phone: [phone]  Fax: [phone]  Consultation  Referring Provider:    Dr Mortimer Fries  Primary Care Physician:  Sharyne Peach, MD Primary Gastroenterologist: None          Reason for Consultation:     GI bleed   Date of Admission:  06/02/2019 Date of Consultation:  06/03/2019         HPI:   Jamie Boyd is a 69 y.o. female was admitted on 06/02/2019 with a history of GERD, strokes Parkinson's disease, dementia presented with altered mental status.  Being treated for acute metabolic encephalopathy secondary to sepsis.  Found to have acute kidney injury and left hydronephrosis.  Patient was under hospice care at home. On admission was in atrial fibrillation with RVR.   Baseline hemoglobin a year back with 11.8 g with an MCV of 91 but on admission was 9.9 g with an MCV of 77.  At this time the creatinine was 3.55 with a baseline of 1.88.  Alkaline phosphatase of 245, total bilirubin of 8.3 elevated AST and ALT.  No ultrasound was performed to evaluate the liver.  CT renal stone protocol demonstrated wall thickening in the rectum that was nonspecific but could reflect inflammation proctitis.  Small lucencies seen throughout the right iliac bone concerning for osseous pneumatosis.  This morning hemoglobin 6.8 g with an MCV of 78.5.  At the same time creatinine dropped from 3.55-2.49.   Patient is intubated and sedated, unable toprovide any history , spoke with her nurse, no bleeding seen this shift. No hematemesis.    Past Medical History:  Diagnosis Date  . Alzheimer disease ([place])   . Anemia   . Dementia ([place])   . Goiter   . Memory deficit   . Parkinson's disease ([place])   . Urinary tract infection, site not specified     Past Surgical History:  Procedure Laterality Date  . COLONOSCOPY WITH PROPOFOL N/A 02/18/2015   Procedure: COLONOSCOPY WITH PROPOFOL;   Surgeon: Hulen Luster, MD;  Location: [facility] ENDOSCOPY;  Service: Gastroenterology;  Laterality: N/A;  . CYSTOSCOPY WITH STENT PLACEMENT Left 06/02/2019   Procedure: CYSTOSCOPY WITH STENT PLACEMENT;  Surgeon: Lucas Mallow, MD;  Location: [facility] ORS;  Service: Urology;  Laterality: Left;  . ESOPHAGOGASTRODUODENOSCOPY N/A 02/18/2015   Procedure: ESOPHAGOGASTRODUODENOSCOPY (EGD);  Surgeon: Hulen Luster, MD;  Location: [facility] ENDOSCOPY;  Service: Gastroenterology;  Laterality: N/A;    Prior to Admission medications   Medication Sig Start Date End Date Taking? Authorizing Provider  Acetaminophen 500 MG capsule Take 1,000 mg by mouth every 12 (twelve) hours as needed for pain.    Yes [provider]  albuterol (PROVENTIL) (2.5 MG/3ML) 0.083% nebulizer solution Take 2.5 mg by nebulization every 4 (four) hours as needed for wheezing or shortness of breath.   Yes [provider]  aspirin EC 81 MG tablet Take 81 mg by mouth daily.   Yes [provider]  bisacodyl (DULCOLAX) 10 MG suppository Place 10 mg rectally daily as needed for moderate constipation.   Yes [provider]  divalproex (DEPAKOTE SPRINKLE) 125 MG capsule Take 125 mg by mouth 3 (three) times daily.    Yes [provider]  fexofenadine (ALLEGRA) 180 MG tablet Take 180 mg by mouth daily.   Yes [provider]  furosemide (LASIX) 20 MG tablet Take by mouth. 06/02/19 06/01/20 Yes  [provider]  hydrochlorothiazide (MICROZIDE) 12.5 MG capsule TAKE 1 CAPSULE BY MOUTH ONCE DAILY 08/27/18  Yes [provider]  ibuprofen (ADVIL) 400 MG tablet Take 400 mg by mouth 3 (three) times daily as needed for fever or mild pain.    Yes [provider]  LORazepam (ATIVAN) 0.5 MG tablet Take 0.5 mg by mouth every 4 (four) hours as needed (anxiety, agitation, restlessness or nausea).    Yes [provider]  montelukast (SINGULAIR) 10 MG tablet Take 10 mg by mouth at bedtime.   Yes  [provider]  Morphine Sulfate (MORPHINE CONCENTRATE) 10 mg / 0.5 ml concentrated solution Take 5 mg by mouth every hour as needed for severe pain or shortness of breath.    Yes [provider]  nystatin (NYSTATIN) powder Apply 1 application topically 2 (two) times daily as needed (skin irritation).   Yes [provider]  omeprazole (PRILOSEC) 20 MG capsule Take 20 mg by mouth daily.    Yes [provider]  oxybutynin (DITROPAN) 5 MG tablet Take 5 mg by mouth 2 (two) times daily.   Yes [provider]  oxyCODONE (OXY IR/ROXICODONE) 5 MG immediate release tablet Take 2.5-5 mg by mouth every 4 (four) hours as needed for severe pain or breakthrough pain.   Yes [provider]  phenylephrine-shark liver oil-mineral oil-petrolatum (PREPARATION H) 0.25-3-14-71.9 % rectal ointment Place 1 application rectally 4 (four) times daily as needed for hemorrhoids.   Yes [provider]  QUEtiapine (SEROQUEL) 100 MG tablet Take 100 mg by mouth at bedtime.   Yes [provider]  QUEtiapine (SEROQUEL) 25 MG tablet Take 25 mg by mouth daily as needed (awaking overnight).   Yes [provider]  senna-docusate (SENOKOT-S) 8.6-50 MG tablet Take 1-3 tablets by mouth 3 (three) times daily as needed for mild constipation.   Yes [provider]  sennosides-docusate sodium (SENOKOT-S) 8.6-50 MG tablet Take 2 tablets by mouth daily.   Yes [provider]  traZODone (DESYREL) 100 MG tablet Take 100 mg by mouth at bedtime.   Yes [provider]  witch hazel-glycerin (TUCKS) pad Apply 1 application topically as needed for itching or irritation.   Yes [provider]  memantine (NAMENDA) 10 MG tablet Take 10 mg by mouth 2 (two) times daily.  11/23/15 11/27/17  [provider]    Family History  Problem Relation Age of Onset  . Diabetes Maternal Uncle   . Prostate cancer Neg Hx   . Bladder Cancer Neg  Hx   . Kidney cancer Neg Hx      Social History   Tobacco Use  . Smoking status: Former Smoker    Types: Cigarettes    Quit date: 01/25/2014    Years since quitting: 5.3  . Smokeless tobacco: Never Used  Substance Use Topics  . Alcohol use: No  . Drug use: No    Allergies as of 06/02/2019 - Review Complete 06/02/2019  Allergen Reaction Noted  . Pollen extract Other (See Comments) 11/01/2016    Review of Systems:    Unable to provide ROS as she is intubated and sedated    Physical Exam:  Vital signs in last 24 hours: Temp:  [96.7 F (35.9 C)-98.4 F (36.9 C)] 97.8 F (36.6 C) (04/27 1242) Pulse Rate:  [61-101] 69 (04/27 1400) Resp:  [12-25] 15 (04/27 1400) BP: (67-199)/(43-163) 100/52 (04/27 1400) SpO2:  [96 %-100 %] 100 % (04/27 1400) FiO2 (%):  [30 %-50 %]  30 % (04/27 1050) Weight:  [77.2 kg] 77.2 kg (04/26 1836) Last BM Date: 06/02/19 General:   Intubated  Head:  Normocephalic and atraumatic. Eyes:   Unable to assess  Ears:  Unable to assess. Neck:  Supple; no masses or thyroidomegaly Abdomen:  Soft, nondistended, nontender. Normal bowel sounds. No appreciable masses or hepatomegaly.  No rebound or guarding.  Neurologic:  Alert and oriented xo;   Psych:  Unable to assess   LAB RESULTS: Recent Labs    06/02/19 1040 06/03/19 0039  WBC 20.0* 16.9*  HGB 9.9* 6.8*  HCT 26.9* 18.6*  PLT 69* 52*   BMET Recent Labs    06/02/19 1040 06/03/19 0039  NA 143 157*  K 4.8 3.9  CL 111 119*  CO2 19* 29  GLUCOSE 137* 144*  BUN 139* 105*  CREATININE 3.55* 2.49*  CALCIUM 8.6* 7.0*   LFT Recent Labs    06/02/19 1040  PROT 7.5  ALBUMIN 2.0*  AST 104*  ALT 94*  ALKPHOS 245*  BILITOT 8.3*   PT/INR Recent Labs    06/02/19 1927 06/03/19 0039  LABPROT 17.6* 17.4*  INR 1.5* 1.4*    STUDIES: CT HEAD WO CONTRAST  Result Date: 06/02/2019 CLINICAL DATA:  Dementia.  Encephalopathy.  Mental status changes. EXAM: CT HEAD WITHOUT CONTRAST TECHNIQUE:  Contiguous axial images were obtained from the base of the skull through the vertex without intravenous contrast. COMPARISON:  09/10/2017 FINDINGS: Brain: Advanced generalized brain atrophy, progressive since the previous study. Extensive chronic small-vessel ischemic changes throughout the cerebral hemispheric white matter. No sign of acute infarction, mass lesion or hemorrhage. Ventricular size is considerably increased since the previous study, but seemingly in proportion to the progressive generalized atrophy. Possibility of normal pressure hydrocephalus is not excluded however. Vascular: There is atherosclerotic calcification of the major vessels at the base of the brain. Skull: Negative Sinuses/Orbits: Clear/normal Other: None IMPRESSION: Since 2019, there has been progressive generalized brain atrophy and worsening of chronic small vessel ischemic change throughout the brain. Ventricles are larger, which I presume relates to ex vacuo enlargement from the worsened atrophy. I cannot completely rule out the possibility of normal pressure hydrocephalus. There is no acute stroke or intracranial hemorrhage evident by CT. Electronically Signed   By: Nelson Chimes M.D.   On: 06/02/2019 16:48   US RENAL  Result Date: 06/02/2019 CLINICAL DATA:  Acute kidney injury EXAM: RENAL / URINARY TRACT ULTRASOUND COMPLETE COMPARISON:  Ultrasound 10/26/2017 FINDINGS: Right Kidney: Renal measurements: 10.8 x 3.9 x 4.3 cm = volume: 97 mL . Echogenicity within normal limits. No mass or hydronephrosis visualized. Left Kidney: Renal measurements: 10.8 x 6.1 x 4.5 cm = volume: 156 mL. Cortical echogenicity within normal limits. Moderate hydronephrosis. Echogenic foci within dilated left renal pelvis, potentially stones, these measure up to 12 mm. Visualized proximal ureter is also dilated. Bladder: Foley catheter in the bladder which is empty Other: None. IMPRESSION: 1. Moderate left hydronephrosis with proximal hydroureter.  Echogenic foci within dilated left renal pelvis, probable stones. CT KUB suggested for further evaluation and to assess for distal obstruction of the left kidney 2. Normal ultrasound appearance of the right kidney Electronically Signed   By: Donavan Foil M.D.   On: 06/02/2019 17:39   DG Chest Port 1 View  Result Date: 06/02/2019 CLINICAL DATA:  Endotracheally intubated. EXAM: PORTABLE CHEST 1 VIEW COMPARISON:  Earlier this day. FINDINGS: Placement of endotracheal tube with tip at the level of the clavicular heads 5.2 cm from  the carina. Persistent low lung volumes with streaky bibasilar opacities. Trace pleural effusions. Normal heart size and mediastinal contours for degree of rotation. No pulmonary edema or pneumothorax. No acute osseous abnormalities are seen. IMPRESSION: 1. Placement of endotracheal tube with tip at the level of the clavicular heads 5.2 cm from the carina. 2. Persistent low lung volumes with streaky bibasilar opacities, favor atelectasis. Electronically Signed   By: Keith Rake M.D.   On: 06/02/2019 22:55   DG Chest Portable 1 View  Result Date: 06/02/2019 CLINICAL DATA:  Hypoxia EXAM: PORTABLE CHEST 1 VIEW COMPARISON:  03/07/2019 FINDINGS: Low lung volumes. Bibasilar opacities. No significant pleural effusion. No pneumothorax. Stable cardiomediastinal contours. IMPRESSION: Bibasilar opacities favored to reflect atelectasis. Electronically Signed   By: Macy Mis M.D.   On: 06/02/2019 10:57   CT RENAL STONE STUDY  Result Date: 06/02/2019 CLINICAL DATA:  Altered mental status. Oxygen desaturation. Recurrent UTI. Indwelling Foley catheter. EXAM: CT ABDOMEN AND PELVIS WITHOUT CONTRAST TECHNIQUE: Multidetector CT imaging of the abdomen and pelvis was performed following the standard protocol without IV contrast. COMPARISON:  CT abdomen pelvis 04/04/2017 FINDINGS: Lower chest: Small bilateral pleural effusions. There are scattered bandlike opacities in the bilateral lungs  favored represent atelectasis. There is a more focal small opacity in the peripheral left lung (series 6, image 6) which could reflect atelectasis though infection not excluded. Hepatobiliary: No focal liver abnormality is seen. Unremarkable gallbladder. Pancreas: Unremarkable. No surrounding inflammatory changes. Spleen: Normal in size without focal abnormality. Adrenals/Urinary Tract: Adrenal glands are unremarkable. The left kidney is enlarged. There is moderate/severe left hydronephrosis and ureterectasis secondary to a stone in the distal left ureter measuring 7 mm. The right kidney is unremarkable. The urinary bladder is decompressed with a Foley catheter in place. Stomach/Bowel: Stomach is within normal limits. Appendix appears normal. There is moderate wall thickening in the rectum which is nonspecific but could reflect chronic inflammation. No evidence of obstruction. There are scattered colonic diverticula without evidence of diverticulitis. Vascular/Lymphatic: Moderate atherosclerotic disease of the abdominal aorta without aneurysm. Vascular patency cannot be assessed in the absence IV contrast. No lymphadenopathy. Reproductive: No adnexal masses. Other: No abdominal wall hernia or abnormality. No abdominopelvic ascites. Musculoskeletal: There is a mild new wedge compression fracture at L1. No significant listhesis. Multilevel degenerative disc disease throughout the lumbar spine. There are multiple lucencies throughout the right iliac bone. IMPRESSION: 1. There is moderate/severe left hydronephrosis and ureterectasis secondary to a 7 mm stone in the distal left ureter. 2. Small bilateral pleural effusions. Scattered bandlike opacities in the bilateral lungs favored represent atelectasis. There is a more focal small opacity in the peripheral left lung which could reflect atelectasis though infection not excluded. 3. Moderate wall thickening in the rectum which is nonspecific but could reflect chronic  inflammation/proctitis. Colonoscopy should be considered when/if clinically appropriate. 4. Multiple small lucencies throughout the right iliac bone, favored to represent intra osseous pneumatocysts, a clinically insignificant finding. 5. Mild new wedge compression fracture at L1. No significant listhesis. Aortic Atherosclerosis (ICD10-I70.0). Electronically Signed   By: Audie Pinto M.D.   On: 06/02/2019 20:48      Impression / Plan:   [place] is a 69 y.o. y/o female with multiple comorbidities including Parkinson's disease, dementia, CKD admitted with urosepsis.  I have been consulted for drop in hemoglobin and some blood seen in the stool.  Reviewing the CT scan from the renal stone protocol it demonstrates some degree of proctitis.  Initially the hemoglobin was  probably elevated due to hemoconcentration from hypovolemia and AKI.  Subsequently with rehydration her hemoglobin has dropped.Some rectal bleeding could have been from the proctitis    Plan 1. Less likely an upper GI bleed- can watch aspirate from NG tube to see if any blood or coffee ground returns. No further rectal bleeding per nursing, prior could have been from procitis. She is very frail , wouldn't tolerate a bowel prep for any colonoscopy. I would watch closely for any further episodes of bleeding . With multi organ failure has poor prognosis and hence I wouldn't rush into any procedure unless she has a hemorrhage as the risks > benefits.   Thank you for involving me in the care of this patient.      LOS: 1 day   Jamie Bellows, MD  06/03/2019, 2:12 PM

## 2019-06-03 NOTE — Progress Notes (Signed)
[facility]  ROUNDING NOTE   Subjective:   Uroscopy and left ureteral stent placement by Dr. Gloriann Loan yesterday. Patient was left intubated and transferred to ICU. Patient with hypotension but did not receive vasopressors.   UOP 1639mL BUN and creatinine improved.   Serum sodium 157.   No family at bedside.   Objective:  Vital signs in last 24 hours:  Temp:  [96.7 F (35.9 C)-98 F (36.7 C)] 97.5 F (36.4 C) (04/27 0702) Pulse Rate:  [61-177] 63 (04/27 0900) Resp:  [12-29] 19 (04/27 0900) BP: (67-199)/(43-163) 83/46 (04/27 0900) SpO2:  [96 %-100 %] 100 % (04/27 0900) FiO2 (%):  [35 %-50 %] 35 % (04/27 0741) Weight:  [77.2 kg-86 kg] 77.2 kg (04/26 1836)  Weight change:  Filed Weights   06/02/19 1042 06/02/19 1836  Weight: 86 kg 77.2 kg    Intake/Output: I/O last 3 completed shifts: In: 2568.1 [I.V.:2025.8; IV Piggyback:542.3] Out: [id number]   Intake/Output this shift:  No intake/output data recorded.  Physical Exam: General: Critically ill  Head: ETT  Eyes: Anicteric, PERRL  Neck: Supple, trachea midline  Lungs:  Clear to auscultation, PRVC FiO 35%  Heart: irregular  Abdomen:  Soft, nontender,   Extremities:  no peripheral edema.  Neurologic: Intubated and sedated  Skin: No lesions   GU Foley with yellow urine    Basic Metabolic Panel: Recent Labs  Lab 06/02/19 1040 06/03/19 0039  NA 143 157*  K 4.8 3.9  CL 111 119*  CO2 19* 29  GLUCOSE 137* 144*  BUN 139* 105*  CREATININE 3.55* 2.49*  CALCIUM 8.6* 7.0*  MG  --  2.9*  PHOS  --  5.3*    Liver Function Tests: Recent Labs  Lab 06/02/19 1040  AST 104*  ALT 94*  ALKPHOS 245*  BILITOT 8.3*  PROT 7.5  ALBUMIN 2.0*   No results for input(s): LIPASE, AMYLASE in the last 168 hours. No results for input(s): AMMONIA in the last 168 hours.  CBC: Recent Labs  Lab 06/02/19 1040 06/03/19 0039  WBC 20.0* 16.9*  NEUTROABS 15.2* 14.3*  HGB 9.9* 6.8*  HCT 26.9* 18.6*  MCV  77.5* 78.5*  PLT 69* 52*    Cardiac Enzymes: No results for input(s): CKTOTAL, CKMB, CKMBINDEX, TROPONINI in the last 168 hours.  BNP: Invalid input(s): POCBNP  CBG: Recent Labs  Lab 06/03/19 0027 06/03/19 0401 06/03/19 0743  GLUCAP 105* 104* 117*    Microbiology: Results for orders placed or performed during the hospital encounter of 06/02/19  Respiratory Panel by RT PCR (Flu A&B, Covid) - Nasopharyngeal Swab     Status: None   Collection Time: 06/02/19 11:05 AM   Specimen: Nasopharyngeal Swab  Result Value Ref Range Status   SARS Coronavirus 2 by RT PCR NEGATIVE NEGATIVE Final    Comment: (NOTE) SARS-CoV-2 target nucleic acids are NOT DETECTED. The SARS-CoV-2 RNA is generally detectable in upper respiratoy specimens during the acute phase of infection. The lowest concentration of SARS-CoV-2 viral copies this assay can detect is 131 copies/mL. A negative result does not preclude SARS-Cov-2 infection and should not be used as the sole basis for treatment or other patient management decisions. A negative result may occur with  improper specimen collection/handling, submission of specimen other than nasopharyngeal swab, presence of viral mutation(s) within the areas targeted by this assay, and inadequate number of viral copies (<131 copies/mL). A negative result must be combined with clinical observations, patient history, and epidemiological information. The expected result is  Negative. Fact Sheet for Patients:  PinkCheek.be Fact Sheet for Healthcare Providers:  GravelBags.it This test is not yet ap proved or cleared by the [place] FDA and  has been authorized for detection and/or diagnosis of SARS-CoV-2 by FDA under an Emergency Use Authorization (EUA). This EUA will remain  in effect (meaning this test can be used) for the duration of the COVID-19 declaration under Section 564(b)(1) of the Act, 21  U.S.C. section 360bbb-3(b)(1), unless the authorization is terminated or revoked sooner.    Influenza A by PCR NEGATIVE NEGATIVE Final   Influenza B by PCR NEGATIVE NEGATIVE Final    Comment: (NOTE) The Xpert Xpress SARS-CoV-2/FLU/RSV assay is intended as an aid in  the diagnosis of influenza from Nasopharyngeal swab specimens and  should not be used as a sole basis for treatment. Nasal washings and  aspirates are unacceptable for Xpert Xpress SARS-CoV-2/FLU/RSV  testing. Fact Sheet for Patients: PinkCheek.be Fact Sheet for Healthcare Providers: GravelBags.it This test is not yet approved or cleared by the [place] FDA and  has been authorized for detection and/or diagnosis of SARS-CoV-2 by  FDA under an Emergency Use Authorization (EUA). This EUA will remain  in effect (meaning this test can be used) for the duration of the  Covid-19 declaration under Section 564(b)(1) of the Act, 21  U.S.C. section 360bbb-3(b)(1), unless the authorization is  terminated or revoked. Performed at [facility], [address] [place], [place] [address]   Culture, blood (x 2)     Status: None (Preliminary result)   Collection Time: 06/02/19  5:01 PM   Specimen: BLOOD  Result Value Ref Range Status   Specimen Description BLOOD RIGHT HAND  Final   Special Requests   Final    BOTTLES DRAWN AEROBIC ONLY Blood Culture adequate volume   Culture   Final    NO GROWTH < 12 HOURS Performed at [facility], [address], [place], [place] [address]    Report Status PENDING  Incomplete  Culture, blood (x 2)     Status: None (Preliminary result)   Collection Time: 06/02/19  5:01 PM   Specimen: BLOOD  Result Value Ref Range Status   Specimen Description BLOOD LEFT ASSIST CONTROL  Final   Special Requests   Final    BOTTLES DRAWN AEROBIC ONLY Blood Culture adequate volume   Culture   Final    NO GROWTH < 12  HOURS Performed at [facility], [address] [place], [place] [address]    Report Status PENDING  Incomplete  MRSA PCR Screening     Status: None   Collection Time: 06/02/19  6:35 PM   Specimen: Nasopharyngeal  Result Value Ref Range Status   MRSA by PCR NEGATIVE NEGATIVE Final    Comment:        The GeneXpert MRSA Assay (FDA approved for NASAL specimens only), is one component of a comprehensive MRSA colonization surveillance program. It is not intended to diagnose MRSA infection nor to guide or monitor treatment for MRSA infections. Performed at [facility], [place],  [address]   C Difficile Quick Screen w PCR reflex     Status: None   Collection Time: 06/02/19 10:43 PM   Specimen: STOOL  Result Value Ref Range Status   C Diff antigen NEGATIVE NEGATIVE Final   C Diff toxin NEGATIVE NEGATIVE Final   C Diff interpretation No C. difficile detected.  Final    Comment: Performed at [facility], [address], [place], [place] [address]    Coagulation Studies: Recent Labs    06/02/19 1927 06/03/19 0039  LABPROT 17.6* 17.4*  INR 1.5* 1.4*    Urinalysis: Recent Labs    06/02/19 1041  COLORURINE YELLOW  LABSPEC 1.015  PHURINE 7.0  GLUCOSEU NEGATIVE  HGBUR MODERATE*  BILIRUBINUR SMALL*  KETONESUR NEGATIVE  PROTEINUR 100*  NITRITE NEGATIVE  LEUKOCYTESUR SMALL*      Imaging: CT HEAD WO CONTRAST  Result Date: 06/02/2019 CLINICAL DATA:  Dementia.  Encephalopathy.  Mental status changes. EXAM: CT HEAD WITHOUT CONTRAST TECHNIQUE: Contiguous axial images were obtained from the base of the skull through the vertex without intravenous contrast. COMPARISON:  09/10/2017 FINDINGS: Brain: Advanced generalized brain atrophy, progressive since the previous study. Extensive chronic small-vessel ischemic changes throughout the cerebral hemispheric white matter. No sign of acute infarction, mass lesion or hemorrhage.  Ventricular size is considerably increased since the previous study, but seemingly in proportion to the progressive generalized atrophy. Possibility of normal pressure hydrocephalus is not excluded however. Vascular: There is atherosclerotic calcification of the major vessels at the base of the brain. Skull: Negative Sinuses/Orbits: Clear/normal Other: None IMPRESSION: Since 2019, there has been progressive generalized brain atrophy and worsening of chronic small vessel ischemic change throughout the brain. Ventricles are larger, which I presume relates to ex vacuo enlargement from the worsened atrophy. I cannot completely rule out the possibility of normal pressure hydrocephalus. There is no acute stroke or intracranial hemorrhage evident by CT. Electronically Signed   By: Nelson Chimes M.D.   On: 06/02/2019 16:48   US RENAL  Result Date: 06/02/2019 CLINICAL DATA:  Acute kidney injury EXAM: RENAL / URINARY TRACT ULTRASOUND COMPLETE COMPARISON:  Ultrasound 10/26/2017 FINDINGS: Right Kidney: Renal measurements: 10.8 x 3.9 x 4.3 cm = volume: 97 mL . Echogenicity within normal limits. No mass or hydronephrosis visualized. Left Kidney: Renal measurements: 10.8 x 6.1 x 4.5 cm = volume: 156 mL. Cortical echogenicity within normal limits. Moderate hydronephrosis. Echogenic foci within dilated left renal pelvis, potentially stones, these measure up to 12 mm. Visualized proximal ureter is also dilated. Bladder: Foley catheter in the bladder which is empty Other: None. IMPRESSION: 1. Moderate left hydronephrosis with proximal hydroureter. Echogenic foci within dilated left renal pelvis, probable stones. CT KUB suggested for further evaluation and to assess for distal obstruction of the left kidney 2. Normal ultrasound appearance of the right kidney Electronically Signed   By: Donavan Foil M.D.   On: 06/02/2019 17:39   DG Chest Port 1 View  Result Date: 06/02/2019 CLINICAL DATA:  Endotracheally intubated. EXAM:  PORTABLE CHEST 1 VIEW COMPARISON:  Earlier this day. FINDINGS: Placement of endotracheal tube with tip at the level of the clavicular heads 5.2 cm from the carina. Persistent low lung volumes with streaky bibasilar opacities. Trace pleural effusions. Normal heart size and mediastinal contours for degree of rotation. No pulmonary edema or pneumothorax. No acute osseous abnormalities are seen. IMPRESSION: 1. Placement of endotracheal tube with tip at the level of the clavicular heads 5.2 cm from the carina. 2. Persistent low lung volumes with streaky bibasilar opacities, favor atelectasis. Electronically Signed   By: Keith Rake M.D.   On: 06/02/2019 22:55   DG Chest Portable 1 View  Result Date: 06/02/2019 CLINICAL DATA:  Hypoxia EXAM: PORTABLE CHEST 1 VIEW COMPARISON:  03/07/2019 FINDINGS: Low lung volumes. Bibasilar opacities. No significant pleural effusion. No pneumothorax. Stable cardiomediastinal contours. IMPRESSION: Bibasilar opacities favored to reflect atelectasis.  Electronically Signed   By: Macy Mis M.D.   On: 06/02/2019 10:57   CT RENAL STONE STUDY  Result Date: 06/02/2019 CLINICAL DATA:  Altered mental status. Oxygen desaturation. Recurrent UTI. Indwelling Foley catheter. EXAM: CT ABDOMEN AND PELVIS WITHOUT CONTRAST TECHNIQUE: Multidetector CT imaging of the abdomen and pelvis was performed following the standard protocol without IV contrast. COMPARISON:  CT abdomen pelvis 04/04/2017 FINDINGS: Lower chest: Small bilateral pleural effusions. There are scattered bandlike opacities in the bilateral lungs favored represent atelectasis. There is a more focal small opacity in the peripheral left lung (series 6, image 6) which could reflect atelectasis though infection not excluded. Hepatobiliary: No focal liver abnormality is seen. Unremarkable gallbladder. Pancreas: Unremarkable. No surrounding inflammatory changes. Spleen: Normal in size without focal abnormality. Adrenals/Urinary Tract:  Adrenal glands are unremarkable. The left kidney is enlarged. There is moderate/severe left hydronephrosis and ureterectasis secondary to a stone in the distal left ureter measuring 7 mm. The right kidney is unremarkable. The urinary bladder is decompressed with a Foley catheter in place. Stomach/Bowel: Stomach is within normal limits. Appendix appears normal. There is moderate wall thickening in the rectum which is nonspecific but could reflect chronic inflammation. No evidence of obstruction. There are scattered colonic diverticula without evidence of diverticulitis. Vascular/Lymphatic: Moderate atherosclerotic disease of the abdominal aorta without aneurysm. Vascular patency cannot be assessed in the absence IV contrast. No lymphadenopathy. Reproductive: No adnexal masses. Other: No abdominal wall hernia or abnormality. No abdominopelvic ascites. Musculoskeletal: There is a mild new wedge compression fracture at L1. No significant listhesis. Multilevel degenerative disc disease throughout the lumbar spine. There are multiple lucencies throughout the right iliac bone. IMPRESSION: 1. There is moderate/severe left hydronephrosis and ureterectasis secondary to a 7 mm stone in the distal left ureter. 2. Small bilateral pleural effusions. Scattered bandlike opacities in the bilateral lungs favored represent atelectasis. There is a more focal small opacity in the peripheral left lung which could reflect atelectasis though infection not excluded. 3. Moderate wall thickening in the rectum which is nonspecific but could reflect chronic inflammation/proctitis. Colonoscopy should be considered when/if clinically appropriate. 4. Multiple small lucencies throughout the right iliac bone, favored to represent intra osseous pneumatocysts, a clinically insignificant finding. 5. Mild new wedge compression fracture at L1. No significant listhesis. Aortic Atherosclerosis (ICD10-I70.0). Electronically Signed   By: Audie Pinto  M.D.   On: 06/02/2019 20:48     Medications:   . sodium chloride Stopped (06/03/19 0352)  . dexmedetomidine (PRECEDEX) IV infusion 0.5 mcg/kg/hr (06/03/19 [id number])  . dextrose 75 mL/hr at 06/03/19 0634  . diltiazem (CARDIZEM) infusion Stopped (06/02/19 1623)  . fentaNYL infusion INTRAVENOUS    . meropenem (MERREM) IV Stopped (06/03/19 0013)  . phenylephrine (NEO-SYNEPHRINE) Adult infusion Stopped (06/02/19 2254)   . Chlorhexidine Gluconate Cloth  6 each Topical Daily  . docusate  100 mg Oral BID  . insulin aspart  0-9 Units Subcutaneous Q4H  . ipratropium  0.5 mg Nebulization Q6H  . pantoprazole (PROTONIX) IV  40 mg Intravenous Q12H  . polyethylene glycol  17 g Oral Daily   albuterol, midazolam, ondansetron (ZOFRAN) IV  Assessment/ Plan:  Ms. Jamie Boyd is a 69 y.o. black female with Alzheimer's dementia, Parkinson's, who is followed by Hospice , who was admitted to [facility] on 06/02/2019 for Atrial fibrillation with RVR ([place]) [I48.91] Patient found to have left nephrolithiasis and obstruction requiring ureteral stent placement by Dr. Gloriann Loan on 4/26.   1. Acute renal failure: baseline creatinine of 1, GFR >  60 on 09/05/2018.  2. Hypernatremia: free water deficity of 4.2 liter 3. Hypotension 4. Obstructive uropathy.  3. Anemia with renal failure: hemoglobin 6.8 4. Thrombocytopenia 5. Atrial fibrillation: dilitazem initiated this admission  Acute renal failure secondary to prerenal azotemia and obstruction  No acute indication for dialysis. Not a candidate for long term dialysis.  Hold hydrochlorothiazide and ibuprofen.  - Increase D5W at 141mL/hr - PRBC transfusion as per ICU. Will order iron studies - Consider IV albumin replacement.    LOS: 1 Jamie Boyd 4/27/20219:24 AM

## 2019-06-04 ENCOUNTER — Inpatient Hospital Stay: Payer: Self-pay

## 2019-06-04 DIAGNOSIS — R627 Adult failure to thrive: Secondary | ICD-10-CM

## 2019-06-04 DIAGNOSIS — J9601 Acute respiratory failure with hypoxia: Secondary | ICD-10-CM

## 2019-06-04 LAB — ECHOCARDIOGRAM COMPLETE
Height: 66 in
Weight: 2723.12 oz

## 2019-06-04 LAB — CBC WITH DIFFERENTIAL/PLATELET
Abs Immature Granulocytes: 0.88 10*3/uL — ABNORMAL HIGH (ref 0.00–0.07)
Basophils Absolute: 0.1 10*3/uL (ref 0.0–0.1)
Basophils Relative: 1 %
Eosinophils Absolute: 0.3 10*3/uL (ref 0.0–0.5)
Eosinophils Relative: 2 %
HCT: 24.5 % — ABNORMAL LOW (ref 36.0–46.0)
Hemoglobin: 8.9 g/dL — ABNORMAL LOW (ref 12.0–15.0)
Immature Granulocytes: 5 %
Lymphocytes Relative: 9 %
Lymphs Abs: 1.8 10*3/uL (ref 0.7–4.0)
MCH: 29.3 pg (ref 26.0–34.0)
MCHC: 36.3 g/dL — ABNORMAL HIGH (ref 30.0–36.0)
MCV: 80.6 fL (ref 80.0–100.0)
Monocytes Absolute: 0.9 10*3/uL (ref 0.1–1.0)
Monocytes Relative: 4 %
Neutro Abs: 15.7 10*3/uL — ABNORMAL HIGH (ref 1.7–7.7)
Neutrophils Relative %: 79 %
Platelets: 77 10*3/uL — ABNORMAL LOW (ref 150–400)
RBC: 3.04 MIL/uL — ABNORMAL LOW (ref 3.87–5.11)
RDW: 15 % (ref 11.5–15.5)
WBC: 19.7 10*3/uL — ABNORMAL HIGH (ref 4.0–10.5)
nRBC: 0.3 % — ABNORMAL HIGH (ref 0.0–0.2)

## 2019-06-04 LAB — TYPE AND SCREEN
ABO/RH(D): AB POS
Antibody Screen: NEGATIVE
Unit division: 0

## 2019-06-04 LAB — RENAL FUNCTION PANEL
Albumin: 1.6 g/dL — ABNORMAL LOW (ref 3.5–5.0)
Anion gap: 7 (ref 5–15)
BUN: 68 mg/dL — ABNORMAL HIGH (ref 8–23)
CO2: 20 mmol/L — ABNORMAL LOW (ref 22–32)
Calcium: 7.8 mg/dL — ABNORMAL LOW (ref 8.9–10.3)
Chloride: 118 mmol/L — ABNORMAL HIGH (ref 98–111)
Creatinine, Ser: 1.65 mg/dL — ABNORMAL HIGH (ref 0.44–1.00)
GFR calc Af Amer: 36 mL/min — ABNORMAL LOW (ref >60)
GFR calc non Af Amer: 31 mL/min — ABNORMAL LOW (ref >60)
Glucose, Bld: 183 mg/dL — ABNORMAL HIGH (ref 70–99)
Phosphorus: 3.7 mg/dL (ref 2.5–4.6)
Potassium: 3.3 mmol/L — ABNORMAL LOW (ref 3.5–5.1)
Sodium: 145 mmol/L (ref 135–145)

## 2019-06-04 LAB — BPAM RBC
Blood Product Expiration Date: 202105192359
ISSUE DATE / TIME: 202104270936
Unit Type and Rh: 6200

## 2019-06-04 LAB — MAGNESIUM: Magnesium: 2.8 mg/dL — ABNORMAL HIGH (ref 1.7–2.4)

## 2019-06-04 LAB — GLUCOSE, CAPILLARY
Glucose-Capillary: 100 mg/dL — ABNORMAL HIGH (ref 70–99)
Glucose-Capillary: 106 mg/dL — ABNORMAL HIGH (ref 70–99)
Glucose-Capillary: 109 mg/dL — ABNORMAL HIGH (ref 70–99)
Glucose-Capillary: 109 mg/dL — ABNORMAL HIGH (ref 70–99)
Glucose-Capillary: 112 mg/dL — ABNORMAL HIGH (ref 70–99)
Glucose-Capillary: 74 mg/dL (ref 70–99)

## 2019-06-04 LAB — HEMOGLOBIN AND HEMATOCRIT, BLOOD
HCT: 24.1 % — ABNORMAL LOW (ref 36.0–46.0)
HCT: 24.6 % — ABNORMAL LOW (ref 36.0–46.0)
Hemoglobin: 8.9 g/dL — ABNORMAL LOW (ref 12.0–15.0)
Hemoglobin: 8.9 g/dL — ABNORMAL LOW (ref 12.0–15.0)

## 2019-06-04 LAB — TRIGLYCERIDES: Triglycerides: 571 mg/dL — ABNORMAL HIGH (ref <150)

## 2019-06-04 LAB — PHOSPHORUS: Phosphorus: 3.8 mg/dL (ref 2.5–4.6)

## 2019-06-04 LAB — UREA NITROGEN, URINE: Urea Nitrogen, Ur: 854 mg/dL

## 2019-06-04 MED ORDER — LACTATED RINGERS IV BOLUS
2000.0000 mL | Freq: Once | INTRAVENOUS | Status: AC
  Administered 2019-06-04: 08:00:00 2000 mL via INTRAVENOUS

## 2019-06-04 MED ORDER — PANTOPRAZOLE SODIUM 40 MG IV SOLR
40.0000 mg | Freq: Two times a day (BID) | INTRAVENOUS | Status: DC
  Administered 2019-06-04 – 2019-06-06 (×4): 40 mg via INTRAVENOUS
  Filled 2019-06-04 (×4): qty 40

## 2019-06-04 MED ORDER — SODIUM CHLORIDE 0.9 % IV SOLN: 250.0000 mL | INTRAVENOUS | Status: DC

## 2019-06-04 MED ORDER — POTASSIUM CHLORIDE 10 MEQ/100ML IV SOLN
10.0000 meq | INTRAVENOUS | Status: AC
  Administered 2019-06-04 (×3): 10 meq via INTRAVENOUS
  Filled 2019-06-04 (×3): qty 100

## 2019-06-04 MED ORDER — SODIUM CHLORIDE 0.9 % IV SOLN
25.0000 ug/min | INTRAVENOUS | Status: DC
  Administered 2019-06-04: 07:00:00 25 ug/min via INTRAVENOUS
  Filled 2019-06-04: qty 1

## 2019-06-04 MED ORDER — LACTATED RINGERS IV BOLUS
1000.0000 mL | Freq: Once | INTRAVENOUS | Status: AC
  Administered 2019-06-04: 1000 mL via INTRAVENOUS

## 2019-06-04 NOTE — Progress Notes (Addendum)
Progress Note  Patient Name: Jamie Boyd Date of Encounter: 06/04/2019  Primary Cardiologist: New- Dr. Saunders Revel  Subjective   No acute events overnight, patient is intubated, no purposeful movements during my exam today.  Family member/niece at bedside  Inpatient Medications    Scheduled Meds: . chlorhexidine gluconate (MEDLINE KIT)  15 mL Mouth Rinse BID  . Chlorhexidine Gluconate Cloth  6 each Topical Daily  . insulin aspart  0-9 Units Subcutaneous Q4H  . mouth rinse  15 mL Mouth Rinse 10 times per day  . pantoprazole (PROTONIX) IV  40 mg Intravenous BID   Continuous Infusions: . sodium chloride 10 mL/hr at 06/04/19 0320  . cefTRIAXone (ROCEPHIN)  IV Stopped (06/03/19 1814)  . dextrose 100 mL/hr at 06/04/19 1117  . potassium chloride 10 mEq (06/04/19 1122)   PRN Meds: albuterol, fentaNYL (SUBLIMAZE) injection, midazolam, ondansetron (ZOFRAN) IV   Vital Signs    Vitals:   06/04/19 0800 06/04/19 0830 06/04/19 0900 06/04/19 0930  BP: 121/70 124/66 123/69 121/64  Pulse: 65 69 70 64  Resp: _0 Temp:      TempSrc:      SpO2: 98% 98% 98% 98%  Weight:      Height:        Intake/Output Summary (Last 24 hours) at 06/04/2019 1126 Last data filed at 06/04/2019 0846 Gross per 24 hour  Intake 4840.5 ml  Output 1650 ml  Net 3190.5 ml   Last 3 Weights 06/02/2019 06/02/2019 03/07/2019  Weight (lbs) 170 lb 3.1 oz 189 lb 9.5 oz 190 lb  Weight (kg) 77.2 kg 86 kg 86.183 kg      Telemetry    Sinus rhythm, heart rate 64- Personally Reviewed  ECG    New tracing obtained- Personally Reviewed  Physical Exam   GEN:  Intubated, Neck: No JVD Cardiac: RRR, no murmurs, rubs, or gallops.  Respiratory:  Vented breath sounds GI: Soft, nontender, non-distended  MS: No edema; No deformity. Neuro:  Unable to assess Psych: Unable to assess  Labs    High Sensitivity Troponin:   Recent Labs  Lab 06/02/19 1040  TROPONINIHS 14      Chemistry Recent Labs  Lab  06/02/19 1040 06/03/19 0039 06/04/19 0652  NA 143 157* 145  K 4.8 3.9 3.3*  CL 111 119* 118*  CO2 19* 29 20*  GLUCOSE 137* 144* 183*  BUN 139* 105* 68*  CREATININE 3.55* 2.49* 1.65*  CALCIUM 8.6* 7.0* 7.8*  PROT 7.5  --   --   ALBUMIN 2.0*  --  1.6*  AST 104*  --   --   ALT 94*  --   --   ALKPHOS 245*  --   --   BILITOT 8.3*  --   --   GFRNONAA 12* 19* 31*  GFRAA 14* 22* 36*  ANIONGAP _1 Hematology Recent Labs  Lab 06/02/19 1040 06/02/19 1040 06/03/19 0039 06/03/19 1422 06/04/19 0014 06/04/19 0652 06/04/19 0702  WBC 20.0*  --  16.9*  --   --   --  19.7*  RBC 3.47*  --  2.37*  --   --   --  3.04*  HGB 9.9*   < > 6.8*   < > 8.9* 8.9* 8.9*  HCT 26.9*   < > 18.6*   < > 24.1* 24.6* 24.5*  MCV 77.5*  --  78.5*  --   --   --  80.6  MCH 28.5  --  28.7  --   --   --  29.3  MCHC 36.8*  --  36.6*  --   --   --  36.3*  RDW 14.8  --  15.0  --   --   --  15.0  PLT 69*  --  52*  --   --   --  77*   < > = values in this interval not displayed.    BNPNo results for input(s): BNP, PROBNP in the last 168 hours.   DDimer No results for input(s): DDIMER in the last 168 hours.   Radiology    CT HEAD WO CONTRAST  Result Date: 06/02/2019 CLINICAL DATA:  Dementia.  Encephalopathy.  Mental status changes. EXAM: CT HEAD WITHOUT CONTRAST TECHNIQUE: Contiguous axial images were obtained from the base of the skull through the vertex without intravenous contrast. COMPARISON:  09/10/2017 FINDINGS: Brain: Advanced generalized brain atrophy, progressive since the previous study. Extensive chronic small-vessel ischemic changes throughout the cerebral hemispheric white matter. No sign of acute infarction, mass lesion or hemorrhage. Ventricular size is considerably increased since the previous study, but seemingly in proportion to the progressive generalized atrophy. Possibility of normal pressure hydrocephalus is not excluded however. Vascular: There is atherosclerotic calcification of the  major vessels at the base of the brain. Skull: Negative Sinuses/Orbits: Clear/normal Other: None IMPRESSION: Since 2019, there has been progressive generalized brain atrophy and worsening of chronic small vessel ischemic change throughout the brain. Ventricles are larger, which I presume relates to ex vacuo enlargement from the worsened atrophy. I cannot completely rule out the possibility of normal pressure hydrocephalus. There is no acute stroke or intracranial hemorrhage evident by CT. Electronically Signed   By: Nelson Chimes M.D.   On: 06/02/2019 16:48   US RENAL  Result Date: 06/02/2019 CLINICAL DATA:  Acute kidney injury EXAM: RENAL / URINARY TRACT ULTRASOUND COMPLETE COMPARISON:  Ultrasound 10/26/2017 FINDINGS: Right Kidney: Renal measurements: 10.8 x 3.9 x 4.3 cm = volume: 97 mL . Echogenicity within normal limits. No mass or hydronephrosis visualized. Left Kidney: Renal measurements: 10.8 x 6.1 x 4.5 cm = volume: 156 mL. Cortical echogenicity within normal limits. Moderate hydronephrosis. Echogenic foci within dilated left renal pelvis, potentially stones, these measure up to 12 mm. Visualized proximal ureter is also dilated. Bladder: Foley catheter in the bladder which is empty Other: None. IMPRESSION: 1. Moderate left hydronephrosis with proximal hydroureter. Echogenic foci within dilated left renal pelvis, probable stones. CT KUB suggested for further evaluation and to assess for distal obstruction of the left kidney 2. Normal ultrasound appearance of the right kidney Electronically Signed   By: Donavan Foil M.D.   On: 06/02/2019 17:39   DG Chest Port 1 View  Result Date: 06/02/2019 CLINICAL DATA:  Endotracheally intubated. EXAM: PORTABLE CHEST 1 VIEW COMPARISON:  Earlier this day. FINDINGS: Placement of endotracheal tube with tip at the level of the clavicular heads 5.2 cm from the carina. Persistent low lung volumes with streaky bibasilar opacities. Trace pleural effusions. Normal heart size  and mediastinal contours for degree of rotation. No pulmonary edema or pneumothorax. No acute osseous abnormalities are seen. IMPRESSION: 1. Placement of endotracheal tube with tip at the level of the clavicular heads 5.2 cm from the carina. 2. Persistent low lung volumes with streaky bibasilar opacities, favor atelectasis. Electronically Signed   By: Keith Rake M.D.   On: 06/02/2019 22:55   ECHOCARDIOGRAM COMPLETE  Result Date: 06/04/2019    ECHOCARDIOGRAM REPORT   Patient Name:  Donna Christen Date of Exam: 06/03/2019 Medical Rec #:  [id number]    Height:       66.0 in Accession #:    [id number]   Weight:       170.2 lb Date of Birth:  03/11/1950     BSA:          1.867 m Patient Age:    69 years     BP:           92/52 mmHg Patient Gender: F            HR:           98 bpm. Exam Location:  [facility] Procedure: 2D Echo, Cardiac Doppler and Color Doppler Indications:     I48.91 Atrial fibrillation.  History:         Patient has prior history of Echocardiogram examinations, most                  recent 01/25/2017. Parkinson's Disease.  Sonographer:     Wilford Sports Rodgers-Jones Referring Phys:  [id number] Arvil Chaco Diagnosing Phys: Nelva Bush MD IMPRESSIONS  1. Left ventricular ejection fraction, by estimation, is 55 to 60%. The left ventricle has normal function. The left ventricle has no regional wall motion abnormalities. Left ventricular diastolic parameters are consistent with age-related delayed relaxation (normal).  2. Right ventricular systolic function is normal. The right ventricular size is normal. There is normal pulmonary artery systolic pressure.  3. The mitral valve is degenerative. Trivial mitral valve regurgitation. No evidence of mitral stenosis.  4. The aortic valve was not well visualized. Aortic valve regurgitation is not visualized. Mild aortic valve stenosis. Aortic valve mean gradient measures 10.4 mmHg.  5. The inferior vena cava is normal in size with greater than 50% respiratory  variability, suggesting right atrial pressure of 3 mmHg.  6. Cannot rule out small atrial level right to left shunt. FINDINGS  Left Ventricle: Left ventricular ejection fraction, by estimation, is 55 to 60%. The left ventricle has normal function. The left ventricle has no regional wall motion abnormalities. The left ventricular internal cavity size was normal in size. There is  no left ventricular hypertrophy. Left ventricular diastolic parameters are consistent with age-related delayed relaxation (normal). Right Ventricle: The right ventricular size is normal. Right vetricular wall thickness was not assessed. Right ventricular systolic function is normal. There is normal pulmonary artery systolic pressure. The tricuspid regurgitant velocity is 2.04 m/s, and with an assumed right atrial pressure of 3 mmHg, the estimated right ventricular systolic pressure is 67.3 mmHg. Left Atrium: Left atrial size was normal in size. Right Atrium: Right atrial size was normal in size. Pericardium: There is no evidence of pericardial effusion. Mitral Valve: The mitral valve is degenerative in appearance. There is mild thickening of the mitral valve leaflet(s). There is mild calcification of the mitral valve leaflet(s). Trivial mitral valve regurgitation. No evidence of mitral valve stenosis. Tricuspid Valve: The tricuspid valve is normal in structure. Tricuspid valve regurgitation is trivial. Aortic Valve: The aortic valve was not well visualized. . There is mild thickening of the aortic valve. Aortic valve regurgitation is not visualized. Mild aortic stenosis is present. Mild aortic valve annular calcification. There is mild thickening of the aortic valve. Aortic valve mean gradient measures 10.4 mmHg. Aortic valve peak gradient measures 17.1 mmHg. Aortic valve area, by VTI measures 1.59 cm. Pulmonic Valve: The pulmonic valve was not well visualized. Pulmonic valve regurgitation is mild. No evidence of pulmonic stenosis.  Aorta:  The aortic root is normal in size and structure. Pulmonary Artery: The pulmonary artery is not well seen. Venous: The inferior vena cava is normal in size with greater than 50% respiratory variability, suggesting right atrial pressure of 3 mmHg. IAS/Shunts: There is redundancy of the interatrial septum. Cannot rule out small atrial level right to left shunt.  LEFT VENTRICLE PLAX 2D LVIDd:         3.83 cm  Diastology LVIDs:         2.69 cm  LV e' lateral:   9.70 cm/s LV PW:         0.87 cm  LV E/e' lateral: 7.0 LV IVS:        0.72 cm  LV e' medial:    8.70 cm/s LVOT diam:     1.90 cm  LV E/e' medial:  7.9 LV SV:         53 LV SV Index:   29 LVOT Area:     2.84 cm  RIGHT VENTRICLE RV Basal diam:  3.94 cm RV S prime:     23.60 cm/s LEFT ATRIUM             Index       RIGHT ATRIUM           Index LA diam:        3.50 cm 1.87 cm/m  RA Area:     10.50 cm LA Vol (A2C):   47.1 ml 25.22 ml/m RA Volume:   23.40 ml  12.53 ml/m LA Vol (A4C):   31.1 ml 16.66 ml/m LA Biplane Vol: 39.7 ml 21.26 ml/m  AORTIC VALVE AV Area (Vmax):    1.53 cm AV Area (Vmean):   1.50 cm AV Area (VTI):     1.59 cm AV Vmax:           207.00 cm/s AV Vmean:          151.000 cm/s AV VTI:            0.336 m AV Peak Grad:      17.1 mmHg AV Mean Grad:      10.4 mmHg LVOT Vmax:         112.00 cm/s LVOT Vmean:        79.750 cm/s LVOT VTI:          0.188 m LVOT/AV VTI ratio: 0.56  AORTA Ao Root diam: 2.70 cm MITRAL VALVE               TRICUSPID VALVE MV Area (PHT): 2.56 cm    TR Peak grad:   16.6 mmHg MV Decel Time: 296 msec    TR Vmax:        204.00 cm/s MV E velocity: 68.30 cm/s MV A velocity: 99.40 cm/s  SHUNTS MV E/A ratio:  0.69        Systemic VTI:  0.19 m                            Systemic Diam: 1.90 cm Nelva Bush MD Electronically signed by Nelva Bush MD Signature Date/Time: 06/04/2019/6:35:48 AM    Final    CT RENAL STONE STUDY  Result Date: 06/02/2019 CLINICAL DATA:  Altered mental status. Oxygen desaturation. Recurrent UTI.  Indwelling Foley catheter. EXAM: CT ABDOMEN AND PELVIS WITHOUT CONTRAST TECHNIQUE: Multidetector CT imaging of the abdomen and pelvis was performed following the standard protocol without IV contrast. COMPARISON:  CT abdomen  pelvis 04/04/2017 FINDINGS: Lower chest: Small bilateral pleural effusions. There are scattered bandlike opacities in the bilateral lungs favored represent atelectasis. There is a more focal small opacity in the peripheral left lung (series 6, image 6) which could reflect atelectasis though infection not excluded. Hepatobiliary: No focal liver abnormality is seen. Unremarkable gallbladder. Pancreas: Unremarkable. No surrounding inflammatory changes. Spleen: Normal in size without focal abnormality. Adrenals/Urinary Tract: Adrenal glands are unremarkable. The left kidney is enlarged. There is moderate/severe left hydronephrosis and ureterectasis secondary to a stone in the distal left ureter measuring 7 mm. The right kidney is unremarkable. The urinary bladder is decompressed with a Foley catheter in place. Stomach/Bowel: Stomach is within normal limits. Appendix appears normal. There is moderate wall thickening in the rectum which is nonspecific but could reflect chronic inflammation. No evidence of obstruction. There are scattered colonic diverticula without evidence of diverticulitis. Vascular/Lymphatic: Moderate atherosclerotic disease of the abdominal aorta without aneurysm. Vascular patency cannot be assessed in the absence IV contrast. No lymphadenopathy. Reproductive: No adnexal masses. Other: No abdominal wall hernia or abnormality. No abdominopelvic ascites. Musculoskeletal: There is a mild new wedge compression fracture at L1. No significant listhesis. Multilevel degenerative disc disease throughout the lumbar spine. There are multiple lucencies throughout the right iliac bone. IMPRESSION: 1. There is moderate/severe left hydronephrosis and ureterectasis secondary to a 7 mm stone in  the distal left ureter. 2. Small bilateral pleural effusions. Scattered bandlike opacities in the bilateral lungs favored represent atelectasis. There is a more focal small opacity in the peripheral left lung which could reflect atelectasis though infection not excluded. 3. Moderate wall thickening in the rectum which is nonspecific but could reflect chronic inflammation/proctitis. Colonoscopy should be considered when/if clinically appropriate. 4. Multiple small lucencies throughout the right iliac bone, favored to represent intra osseous pneumatocysts, a clinically insignificant finding. 5. Mild new wedge compression fracture at L1. No significant listhesis. Aortic Atherosclerosis (ICD10-I70.0). Electronically Signed   By: Audie Pinto M.D.   On: 06/02/2019 20:48   [place] EKG SITE RITE  Result Date: 06/04/2019 If Site Rite image not attached, placement could not be confirmed due to current cardiac rhythm.  [place] EKG SITE RITE  Result Date: 06/04/2019 If Site Rite image not attached, placement could not be confirmed due to current cardiac rhythm.   Cardiac Studies   TTE 05/2019 1. Left ventricular ejection fraction, by estimation, is 55 to 60%. The  left ventricle has normal function. The left ventricle has no regional  wall motion abnormalities. Left ventricular diastolic parameters are  consistent with age-related delayed  relaxation (normal).  2. Right ventricular systolic function is normal. The right ventricular  size is normal. There is normal pulmonary artery systolic pressure.  3. The mitral valve is degenerative. Trivial mitral valve regurgitation.  No evidence of mitral stenosis.  4. The aortic valve was not well visualized. Aortic valve regurgitation  is not visualized. Mild aortic valve stenosis. Aortic valve mean gradient  measures 10.4 mmHg.  5. The inferior vena cava is normal in size with greater than 50%  respiratory variability, suggesting right atrial pressure of 3  mmHg.  6. Cannot rule out small atrial level right to left shunt  Patient Profile     69 y.o. female with Alzheimer's dementia, recurrent UTIs, CVA, admitted with urosepsis and underwent cystoscopy with ureteral stent placement, being seen due to atrial fibrillation with RVR.  Assessment & Plan    1. Paroxysmal atrial fibrillation -Status post IV diltiazem with conversion to sinus  rhythm -Currently in sinus rhythm -Anemia, thrombocytopenia, cognitive impairment, overall functional status prevents usage of anticoagulant. -Underlying urinary infection might have been the trigger. -Recommend continued supportive care. -If A. fib is to return, amiodarone could be considered although this might cause further decrease in blood pressure which she is already low currently.  2.  Urosepsis -On antibiotics as per primary team  Overall prognosis is grim.  Goals of care discussions as per ICU/primary team.  Please let us know if further input is needed.  Total encounter time 35 minutes  Greater than 50% was spent in counseling and coordination of care with the family and staff   [facility] will sign off.   Medication Recommendations: As above Other recommendations (labs, testing, etc): None Follow up as an outpatient: None  For questions or updates, please contact [place] Please consult [email or web address] for contact info under        Signed, Kate Sable, MD  06/04/2019, 11:26 AM

## 2019-06-04 NOTE — Progress Notes (Signed)
Chaplain was referred by the ICU chaplain for a visit with loved ones at bedside. Chaplain read latest physician notes and was aware of the family conference this morning where family was notified of the patient's condition and have agreed to discontinue aggressive measures.   Chaplain went to the bedside and found Jamie Boyd, who identifies as the patient's niece by choice. The patient lives with her and other family members and Jamie Boyd states she has been part of the patient's caregiving. Jamie Boyd shared with me what the patient has been going through and what the doctor said about her condition. She shared about the family's decision to withdraw life support tomorrow. Chaplain inquired if there was anything Jamie Boyd thought the patient would appreciate from me at this time. Jamie Boyd requested prayer. Chaplain prayed reminding Jamie Boyd and the patient, Jamie Boyd, of God's presence, love, and care for them.  Chaplain reminded Jamie Boyd that she could ask for Chaplain support at any time, especially tomorrow when they meet together. Jamie Boyd expressed her appreciation of chaplain's prayer and support.   06/04/19 1000  Clinical Encounter Type  Visited With Patient and family together  Visit Type Initial;Follow-up  Referral From Chaplain  Consult/Referral To Chaplain  Spiritual Encounters  Spiritual Needs Prayer;Emotional;Grief support  Stress Factors  Patient Stress Factors Not reviewed  Family Stress Factors Loss

## 2019-06-04 NOTE — Progress Notes (Signed)
Follow up visit from previous shifts. Met the lovely niece (by choice). Was page by staff per family request. I sat with her for a period as she shared memories and pictures of patients last birthday with her relatives. She shared loving words and memories. She is trying to find peace but is difficult she states. I will continue to check with her throughout the shift and chaplains will do so tomorrow.

## 2019-06-04 NOTE — Progress Notes (Signed)
Patient BP been soft with SBP in mid- 80- low 90s with MAPs 65 and above. BP dipped to [id number] systolic. ICU NP notified of this change. Received new orders to hang a bolus which is running now. IV team consulted. Three PIVs in hand with no blood return. Neo ordered and on standby if bolus does not elevate BP.

## 2019-06-04 NOTE — Progress Notes (Signed)
Patient ID: Jamie Boyd, female   DOB: 1950/10/02, 69 y.o.   MRN: [id number]  This NP visited patient at the bedside as a follow up to  [place], palliative medicine needs and emotional support.  Patient remains on ventilator and full support in the ICU.  Niece Jamie Boyd is at bedside.  Continued conversation and education with niece regarding current medical situation.  The niece verbalizes her understanding that "this is not what the patient wants", "she has a living will".  She asks me to call patietn's brother/main decision maker regarding "next steps"  Spoke to patient's brother Jamie Boyd by telephone along with Jamie Boyd.  Continue conversation regarding current medical situation specifically as it relates to her living well.  Mr. Jamie Boyd a clear understanding of the seriousness of his sisters current medical situation, her desire for a natual death,  and he wishes to move forward to a comfort path with no further escalation of care and one-way extubation tomorrow at 1230.  Four family members plan to be present for extubation  Plan od Care -DNR/DNI -continue current interventions but no escalation of care, no pressors -comfort is a priority  -one way extubation tomorrow at 12:30  Education regarding natural trajectory and expectations at EOL.  Emotional support offered.  Discussed with family the importance of continued conversation with each other and the  medical providers regarding overall plan of care and treatment options,  ensuring decisions are within the context of the patients values and GOCs.  Questions and concerns addressed   Discussed with Dr Mortimer Fries and bedside RN  Total time spent on the unit was 35 minutes  Greater than 50% of the time was spent in counseling and coordination of care  Wadie Lessen NP  Palliative Medicine Team Team Phone # [phone] Pager [phone]

## 2019-06-04 NOTE — Progress Notes (Addendum)
[place] donor called- ref # [id number] Spoke with stephanie shaffer. Awaiting for a call back.   [place] donor called back spoke with Brooks Sailors- no a candidate for organ donation. Asked to call back with cardiac time of death.

## 2019-06-04 NOTE — Progress Notes (Signed)
[place] paged to ICU by palliative care NP to meet w/pt.'s niece @ bedside; when [facility] arrived, niece leaving to go have conversation w/palliative care, but niece requested [place] visit pt.  Pt. intubated, sedated; pt.'s eyes opened when [facility] spoke to her.  [place] told pt. she was in good hands, chaplains are praying for her and her family.  [facility] offered a brief prayer for pt. to experience sense of divine peace and presence.  Will follow up as needed.    06/04/19 0930  Clinical Encounter Type  Visited With Patient;Family  Visit Type Initial;Psychological support;Spiritual support;Social support;Critical Care  Referral From Palliative care team  Consult/Referral To Chaplain  Spiritual Encounters  Spiritual Needs Emotional

## 2019-06-04 NOTE — Progress Notes (Signed)
CRITICAL CARE NOTE BRIEF PATIENT DESCRIPTION:  69 yo female with alzheimer's dementia admitted with sepsis, acute renal failure, and acute encephalopathy  secondary to UTI in the setting of obstructing left ureteral calculus s/p cystoscopy and left ureteral stent placement remained mechanically intubated postop.  Noted to have acute GI bleed   SIGNIFICANT EVENTS/STUDIES:  04/26: Pt admitted to the stepdown unit with sepsis and acute metabolic encephalopathy  123456: CT Renal Stone Study revealed there is moderate/severe left hydronephrosis and ureterectasis secondary to a 7 mm stone in the distal left ureter. Small bilateral pleural effusions. Scattered bandlike opacities in the bilateral lungs favored represent atelectasis. There is a more focal small opacity in the peripheral left lung which could reflect atelectasis though infection not excluded. Moderate wall thickening in the rectum which is nonspecific but could reflect chronic inflammation/proctitis. Colonoscopy should be considered when/if clinically appropriate. Multiple small lucencies throughout the right iliac bone, favored to represent intra osseous pneumatocysts, a clinically insignificant finding. Mild new wedge compression fracture at L1. No significant Listhesis. 04/26: Due to CT findings urology consulted and pt underwent emergent cystoscopy with left retrograde pyelogram and left ureteral stent placement.  She remained intubated postop PCCM team consulted for vent management 04/26: Pt noted to have watery maroon colored stool postop (according to pts niece this is new for the pt) 4/28 severe hypotensions, started on vasopressors remains vegetative state  CC  resp failure  HPI Remains critically ill +shock on pressors now Family at bedside updated Hard time getting labs, poor IV access IV team to assess   BP (!) 92/52   Pulse 86   Temp 98.5 F (36.9 C) (Axillary)   Resp 15   Ht 5\' 6"  (1.676 m)   Wt 77.2 kg   SpO2 99%    BMI 27.47 kg/m    I/O last 3 completed shifts: In: 22 [I.V.:4047.5; Blood:360; IV Piggyback:1301.5] Out: [id number] [Urine:3325] No intake/output data recorded.  SpO2: 99 % O2 Flow Rate (L/min): 3 L/min FiO2 (%): 30 %  Estimated body mass index is 27.47 kg/m as calculated from the following:   Height as of this encounter: 5\' 6"  (1.676 m).   Weight as of this encounter: 77.2 kg.  REVIEW OF SYSTEMS  PATIENT IS UNABLE TO PROVIDE COMPLETE REVIEW OF SYSTEM S DUE TO SEVERE CRITICAL ILLNESS AND ENCEPHALOPATHY         PHYSICAL EXAMINATION:  GENERAL:critically ill appearing, +resp distress HEAD: Normocephalic, atraumatic.  EYES: Pupils equal, round, reactive to light.  No scleral icterus.  MOUTH: Moist mucosal membrane. NECK: Supple. No thyromegaly. No nodules. No JVD.  PULMONARY: +rhonchi, +wheezing CARDIOVASCULAR: S1 and S2. Regular rate and rhythm. No murmurs, rubs, or gallops.  GASTROINTESTINAL: Soft, nontender, -distended. Positive bowel sounds.  MUSCULOSKELETAL: No swelling, clubbing, or edema.  NEUROLOGIC: obtunded SKIN:intact,warm,dry    MEDICATIONS: I have reviewed all medications and confirmed regimen as documented   CULTURE RESULTS   Recent Results (from the past 240 hour(s))  Respiratory Panel by RT PCR (Flu A&B, Covid) - Nasopharyngeal Swab     Status: None   Collection Time: 06/02/19 11:05 AM   Specimen: Nasopharyngeal Swab  Result Value Ref Range Status   SARS Coronavirus 2 by RT PCR NEGATIVE NEGATIVE Final    Comment: (NOTE) SARS-CoV-2 target nucleic acids are NOT DETECTED. The SARS-CoV-2 RNA is generally detectable in upper respiratoy specimens during the acute phase of infection. The lowest concentration of SARS-CoV-2 viral copies this assay can detect is 131 copies/mL. A negative result  does not preclude SARS-Cov-2 infection and should not be used as the sole basis for treatment or other patient management decisions. A negative result may occur  with  improper specimen collection/handling, submission of specimen other than nasopharyngeal swab, presence of viral mutation(s) within the areas targeted by this assay, and inadequate number of viral copies (<131 copies/mL). A negative result must be combined with clinical observations, patient history, and epidemiological information. The expected result is Negative. Fact Sheet for Patients:  PinkCheek.be Fact Sheet for Healthcare Providers:  GravelBags.it This test is not yet ap proved or cleared by the [place] FDA and  has been authorized for detection and/or diagnosis of SARS-CoV-2 by FDA under an Emergency Use Authorization (EUA). This EUA will remain  in effect (meaning this test can be used) for the duration of the COVID-19 declaration under Section 564(b)(1) of the Act, 21 U.S.C. section 360bbb-3(b)(1), unless the authorization is terminated or revoked sooner.    Influenza A by PCR NEGATIVE NEGATIVE Final   Influenza B by PCR NEGATIVE NEGATIVE Final    Comment: (NOTE) The Xpert Xpress SARS-CoV-2/FLU/RSV assay is intended as an aid in  the diagnosis of influenza from Nasopharyngeal swab specimens and  should not be used as a sole basis for treatment. Nasal washings and  aspirates are unacceptable for Xpert Xpress SARS-CoV-2/FLU/RSV  testing. Fact Sheet for Patients: PinkCheek.be Fact Sheet for Healthcare Providers: GravelBags.it This test is not yet approved or cleared by the [place] FDA and  has been authorized for detection and/or diagnosis of SARS-CoV-2 by  FDA under an Emergency Use Authorization (EUA). This EUA will remain  in effect (meaning this test can be used) for the duration of the  Covid-19 declaration under Section 564(b)(1) of the Act, 21  U.S.C. section 360bbb-3(b)(1), unless the authorization is  terminated or revoked. Performed  at [facility], [address], [place], [place] [address]   Urine Culture     Status: Abnormal   Collection Time: 06/02/19  3:31 PM   Specimen: Urine, Random  Result Value Ref Range Status   Specimen Description   Final    URINE, RANDOM Performed at [facility], [address], [place], [place] [address]    Special Requests   Final    NONE Performed at [facility], [address] [place], [place] [address]    Culture MULTIPLE SPECIES PRESENT, SUGGEST RECOLLECTION (A)  Final   Report Status 06/03/2019 FINAL  Final  Culture, blood (x 2)     Status: None (Preliminary result)   Collection Time: 06/02/19  5:01 PM   Specimen: BLOOD  Result Value Ref Range Status   Specimen Description BLOOD RIGHT HAND  Final   Special Requests   Final    BOTTLES DRAWN AEROBIC ONLY Blood Culture adequate volume   Culture   Final    NO GROWTH 2 DAYS Performed at [facility], [address], [place], [place] [address]    Report Status PENDING  Incomplete  Culture, blood (x 2)     Status: None (Preliminary result)   Collection Time: 06/02/19  5:01 PM   Specimen: BLOOD  Result Value Ref Range Status   Specimen Description BLOOD LEFT ASSIST CONTROL  Final   Special Requests   Final    BOTTLES DRAWN AEROBIC ONLY Blood Culture adequate volume   Culture   Final    NO GROWTH 2 DAYS Performed at [facility], [address], [place], [place] [address]  Report Status PENDING  Incomplete  MRSA PCR Screening     Status: None   Collection Time: 06/02/19  6:35 PM   Specimen: Nasopharyngeal  Result Value Ref Range Status   MRSA by PCR NEGATIVE NEGATIVE Final    Comment:        The GeneXpert MRSA Assay (FDA approved for NASAL specimens only), is one component of a comprehensive MRSA colonization surveillance program. It is not intended to diagnose MRSA infection nor to guide or monitor treatment for MRSA infections. Performed at [facility], [address], [place], [place] [address]   Urine Culture     Status: Abnormal (Preliminary result)   Collection Time: 06/02/19  9:30 PM   Specimen: PATH Other; Urine  Result Value Ref Range Status   Specimen Description   Final    URINE, CLEAN CATCH Performed at [facility], [address], [place], [place] [address]    Special Requests   Final    NONE Performed at [facility], [place], [place] [address]    Culture (A)  Final    30,000 COLONIES/mL GRAM NEGATIVE RODS IDENTIFICATION AND SUSCEPTIBILITIES TO FOLLOW Performed at [facility], [place] [address], [place], [place] [address]    Report Status PENDING  Incomplete  C Difficile Quick Screen w PCR reflex     Status: None   Collection Time: 06/02/19 10:43 PM   Specimen: STOOL  Result Value Ref Range Status   C Diff antigen NEGATIVE NEGATIVE Final   C Diff toxin NEGATIVE NEGATIVE Final   C Diff interpretation No C. difficile detected.  Final    Comment: Performed at [facility], [address] [place], [place] [address]          IMAGING    ECHOCARDIOGRAM COMPLETE  Result Date: 06/04/2019    ECHOCARDIOGRAM REPORT   Patient Name:   MRY URIAS Date of Exam: 06/03/2019 Medical Rec #:  [id number]    Height:       66.0 in Accession #:    [id number]   Weight:       170.2 lb Date of Birth:  August 24, 1950     BSA:          1.867 m Patient Age:    60 years     BP:           92/52 mmHg Patient Gender: F            HR:           98 bpm. Exam Location:  [facility] Procedure: 2D Echo, Cardiac Doppler and Color Doppler Indications:     I48.91 Atrial fibrillation.  History:         Patient has prior history of Echocardiogram examinations, most                  recent 01/25/2017. Parkinson's Disease.  Sonographer:     Wilford Sports Rodgers-Jones Referring Phys:  [id number] Arvil Chaco Diagnosing Phys: Nelva Bush MD IMPRESSIONS  1. Left ventricular ejection fraction, by estimation,  is 55 to 60%. The left ventricle has normal function. The left ventricle has no regional wall motion abnormalities. Left ventricular diastolic parameters are consistent with age-related delayed relaxation (normal).  2. Right ventricular systolic function is normal. The right ventricular size is normal. There is normal pulmonary artery systolic pressure.  3. The mitral valve is degenerative. Trivial mitral valve regurgitation. No evidence of mitral stenosis.  4. The aortic  valve was not well visualized. Aortic valve regurgitation is not visualized. Mild aortic valve stenosis. Aortic valve mean gradient measures 10.4 mmHg.  5. The inferior vena cava is normal in size with greater than 50% respiratory variability, suggesting right atrial pressure of 3 mmHg.  6. Cannot rule out small atrial level right to left shunt. FINDINGS  Left Ventricle: Left ventricular ejection fraction, by estimation, is 55 to 60%. The left ventricle has normal function. The left ventricle has no regional wall motion abnormalities. The left ventricular internal cavity size was normal in size. There is  no left ventricular hypertrophy. Left ventricular diastolic parameters are consistent with age-related delayed relaxation (normal). Right Ventricle: The right ventricular size is normal. Right vetricular wall thickness was not assessed. Right ventricular systolic function is normal. There is normal pulmonary artery systolic pressure. The tricuspid regurgitant velocity is 2.04 m/s, and with an assumed right atrial pressure of 3 mmHg, the estimated right ventricular systolic pressure is [id number] mmHg. Left Atrium: Left atrial size was normal in size. Right Atrium: Right atrial size was normal in size. Pericardium: There is no evidence of pericardial effusion. Mitral Valve: The mitral valve is degenerative in appearance. There is mild thickening of the mitral valve leaflet(s). There is mild calcification of the mitral valve leaflet(s). Trivial mitral  valve regurgitation. No evidence of mitral valve stenosis. Tricuspid Valve: The tricuspid valve is normal in structure. Tricuspid valve regurgitation is trivial. Aortic Valve: The aortic valve was not well visualized. . There is mild thickening of the aortic valve. Aortic valve regurgitation is not visualized. Mild aortic stenosis is present. Mild aortic valve annular calcification. There is mild thickening of the aortic valve. Aortic valve mean gradient measures 10.4 mmHg. Aortic valve peak gradient measures 17.1 mmHg. Aortic valve area, by VTI measures 1.59 cm. Pulmonic Valve: The pulmonic valve was not well visualized. Pulmonic valve regurgitation is mild. No evidence of pulmonic stenosis. Aorta: The aortic root is normal in size and structure. Pulmonary Artery: The pulmonary artery is not well seen. Venous: The inferior vena cava is normal in size with greater than 50% respiratory variability, suggesting right atrial pressure of 3 mmHg. IAS/Shunts: There is redundancy of the interatrial septum. Cannot rule out small atrial level right to left shunt.  LEFT VENTRICLE PLAX 2D LVIDd:         3.83 cm  Diastology LVIDs:         2.69 cm  LV e' lateral:   9.70 cm/s LV PW:         0.87 cm  LV E/e' lateral: 7.0 LV IVS:        0.72 cm  LV e' medial:    8.70 cm/s LVOT diam:     1.90 cm  LV E/e' medial:  7.9 LV SV:         53 LV SV Index:   29 LVOT Area:     2.84 cm  RIGHT VENTRICLE RV Basal diam:  3.94 cm RV S prime:     23.60 cm/s LEFT ATRIUM             Index       RIGHT ATRIUM           Index LA diam:        3.50 cm 1.87 cm/m  RA Area:     10.50 cm LA Vol (A2C):   47.1 ml 25.22 ml/m RA Volume:   23.40 ml  12.53 ml/m LA Vol (A4C):   31.1 ml  16.66 ml/m LA Biplane Vol: 39.7 ml 21.26 ml/m  AORTIC VALVE AV Area (Vmax):    1.53 cm AV Area (Vmean):   1.50 cm AV Area (VTI):     1.59 cm AV Vmax:           207.00 cm/s AV Vmean:          151.000 cm/s AV VTI:            0.336 m AV Peak Grad:      17.1 mmHg AV Mean Grad:       10.4 mmHg LVOT Vmax:         112.00 cm/s LVOT Vmean:        79.750 cm/s LVOT VTI:          0.188 m LVOT/AV VTI ratio: 0.56  AORTA Ao Root diam: 2.70 cm MITRAL VALVE               TRICUSPID VALVE MV Area (PHT): 2.56 cm    TR Peak grad:   16.6 mmHg MV Decel Time: 296 msec    TR Vmax:        204.00 cm/s MV E velocity: 68.30 cm/s MV A velocity: 99.40 cm/s  SHUNTS MV E/A ratio:  0.69        Systemic VTI:  0.19 m                            Systemic Diam: 1.90 cm Nelva Bush MD Electronically signed by Nelva Bush MD Signature Date/Time: 06/04/2019/6:35:48 AM    Final      Nutrition Status: Nutrition Problem: Inadequate oral intake Etiology: inability to eat Signs/Symptoms: NPO status Interventions: Tube feeding, MVI        Indwelling Urinary Catheter continued, requirement due to   Reason to continue Indwelling Urinary Catheter strict Intake/Output monitoring for hemodynamic instability         Ventilator continued, requirement due to severe respiratory failure   Ventilator Sedation RASS 0 to -2       ASSESSMENT AND PLAN SYNOPSIS Severe ACUTE Hypoxic and Hypercapnic Respiratory Failure POST op sepsis/septic shock  from Obstructed and infected Ureteral stone   Severe ACUTE Hypoxic and Hypercapnic Respiratory Failure -continue Mechanical Ventilator support -continue Bronchodilator Therapy -Wean Fio2 and PEEP as tolerated -VAP/VENT bundle implementation Unable to wean due to hemodynamic instability  ACUTE KIDNEY INJURY/Renal Failure -follow chem 7 -follow UO -continue Foley Catheter-assess need -Avoid nephrotoxic agents   NEUROLOGY Avoid sedatives Vegetative state   Septic shock -use vasopressors to keep MAP>65 -follow ABG and LA -follow up cultures -emperic ABX -consider stress dose steroids -aggressive IV fluid Resuscitation    CARDIAC ICU monitoring  INFECTIOUS DISEASE -continue antibiotics as prescribed -follow up cultures   GI GI  PROPHYLAXIS as indicated  NUTRITIONAL STATUS DIET-->start TF's as tolerated Constipation protocol as indicated  ENDO - ICU hypoglycemic\Hyperglycemia protocol -check FSBS per protocol   ELECTROLYTES -follow labs as needed -replace as needed -pharmacy consultation and following    DVT/GI PRX ordered TRANSFUSIONS AS NEEDED MONITOR FSBS ASSESS the need for LABS as needed    Critical Care Time devoted to patient care services described in this note is 32 minutes.   Overall, patient is critically ill, prognosis is guarded.  Patient with Multiorgan failure and at high risk for cardiac arrest and death.   Very Poor prognosis, patient with very poor chance of meaningful recovery  Corrin Parker, M.D.  [facility]  Medical Director [place] Director [facility] Cardio-Pulmonary Department

## 2019-06-04 NOTE — Progress Notes (Signed)
[facility] Room IC09 [facility]) Hospitalized Hospice Patient RN note  Jamie Boyd is a current hospice patient with a terminal diagnosis of Parkinson's disease. Home hospice case manager visited with patient and family the day of admission for concerns of decreased responsiveness and patient not eating or drinking. Family requested patient to be transferred to the emergency department for evaluation. Patient does have an OOF DNR and MOST Form. Patient was admitted to [facility] on 4.26.21 with a diagnosis of acute metabolic encephalopathy. Per Dr. Gilford Rile with [facility], this is a related hospital admission.    Visited patient and niece, Jamie Boyd in the room. Hospital chaplain also in room. Jamie Boyd is unresponsive, opens eyes spontaneously but does not track. Remains on ventilator and appears comfortable. . Bedside RN reports they will not be escalating care further. Jamie Boyd shares they are likely going to withdraw support tomorrow.   V/S: 98.5, 123/69, 75, 15, 98% on 30% FiO2 I/O: 3883/1900 Abnormal Labs:    Ref. Range 06/04/2019 06:52  Potassium Latest Ref Range: 3.5 - 5.1 mmol/L 3.3 (L)  Chloride Latest Ref Range: 98 - 111 mmol/L 118 (H)  CO2 Latest Ref Range: 22 - 32 mmol/L 20 (L)  Glucose Latest Ref Range: 70 - 99 mg/dL 183 (H)  BUN Latest Ref Range: 8 - 23 mg/dL 68 (H)  Creatinine Latest Ref Range: 0.44 - 1.00 mg/dL 1.65 (H)  Calcium Latest Ref Range: 8.9 - 10.3 mg/dL 7.8 (L)  Albumin Latest Ref Range: 3.5 - 5.0 g/dL 1.6 (L)  GFR, Est Non African American Latest Ref Range: >60 mL/min 31 (L)  GFR, Est African American Latest Ref Range: >60 mL/min 36 (L)  Triglycerides Latest Ref Range: <150 mg/dL 571 (H)  Hemoglobin Latest Ref Range: 12.0 - 15.0 g/dL 8.9 (L)  HCT Latest Ref Range: 36.0 - 46.0 % 24.6 (L)     Ref. Range 06/04/2019 07:02  Magnesium Latest Ref Range: 1.7 - 2.4 mg/dL 2.8 (H)  WBC Latest Ref Range: 4.0 - 10.5 K/uL 19.7 (H)  RBC Latest Ref Range: 3.87 - 5.11  MIL/uL 3.04 (L)  Hemoglobin Latest Ref Range: 12.0 - 15.0 g/dL 8.9 (L)  HCT Latest Ref Range: 36.0 - 46.0 % 24.5 (L)  MCHC Latest Ref Range: 30.0 - 36.0 g/dL 36.3 (H)  Platelets Latest Ref Range: 150 - 400 K/uL 77 (L)  nRBC Latest Ref Range: 0.0 - 0.2 % 0.3 (H)  NEUT# Latest Ref Range: 1.7 - 7.7 K/uL 15.7 (H)  Abs Immature Granulocytes Latest Ref Range: 0.00 - 0.07 K/uL 0.88 (H)   Diagnostics: None new IV's/PRN: 0.9 % sodium chloride infusion 10-20 cc/hr, cefTRIAXone 2 g in sodium chloride Q 24 hours, dextrose 5 % solution at 100 cc/hr, phenylephrine 10 mg in sodium chloride 0.9 % 250 mL (0.04 mg/mL) infusion titrated (now stopped),potassium chloride 10 mEq in 100 mL IVPB X 3 hours, fentaNYL (SUBLIMAZE) injection 25-50 mcg X 1, Versed 1 mg X1.  ASSESSMENT AND PLAN SYNOPSIS Severe ACUTE Hypoxic and Hypercapnic Respiratory FailurePOST op sepsis from Obstructed and infected Ureteral stone  Severe ACUTE Hypoxic and Hypercapnic Respiratory Failure -continue Full MV support -continue Bronchodilator Therapy -Wean Fio2 and PEEP as tolerated -will perform SAT/SBT when respiratory parameters are met -VAP/VENT bundle implementation  ACUTE Boyd INJURY/Renal Failure -follow chem 7 -follow UO -continue Foley Catheter-assess need -Avoid nephrotoxic agents -Recheck creatinine   NEUROLOGY obtunded  SHOCK-SEPSIS/HYPOVOLUMIC/CARDIOGENIC -follow ABG and LA -follow up cultures -emperic ABX -consider stress dose steroids -aggressive IV fluid resuscitation  CARDIAC ICU  monitoring  ID -continue IV abx as prescibed -follow up cultures  GI GI PROPHYLAXIS as indicated  NUTRITIONAL STATUS Nutrition Status:  DIET-->NPO Constipation protocol as indicated  ENDO - will use ICU hypoglycemic\Hyperglycemia protocol if indicated  ELECTROLYTES -follow labs as needed -replace as needed -pharmacy consultation and following  DVT/GI PRX ordered TRANSFUSIONS AS  NEEDED MONITOR FSBS ASSESS the need for LABS as needed  Discharge Planning: Ongoing. Plan is for withdrawal of support tomorrow. Per bedside RN plan is for extubation around 1230pm 4.29.21, Family Contact: spoke with niece Jamie Boyd at bedside IDT: updated Goals of Care: Clear, DNR. Extubation tomorrow.  Please call with any hospice related questions or concerns.  Thank you. Margaretmary Eddy, BSN, RN [facility] Liaison [phone]

## 2019-06-04 NOTE — Progress Notes (Signed)
[facility]  ROUNDING NOTE   Subjective:   Niece at bedside.   UOP 1900  D5W infusion   LR bolus this morning.   Objective:  Vital signs in last 24 hours:  Temp:  [97.6 F (36.4 C)-98.5 F (36.9 C)] 98.5 F (36.9 C) (04/28 0350) Pulse Rate:  [61-86] 70 (04/28 0900) Resp:  [11-23] 19 (04/28 0900) BP: (78-124)/(42-80) 123/69 (04/28 0900) SpO2:  [98 %-100 %] 98 % (04/28 0900) FiO2 (%):  [30 %] 30 % (04/28 0720)  Weight change:  Filed Weights   06/02/19 1042 06/02/19 1836  Weight: 86 kg 77.2 kg    Intake/Output: I/O last 3 completed shifts: In: 89 [I.V.:4047.5; Blood:360; IV Piggyback:1301.5] Out: 8119 [Urine:3325]   Intake/Output this shift:  Total I/O In: 1317.4 [I.V.:226.2; IV Piggyback:1091.2] Out: -   Physical Exam: General: Critically ill  Head: ETT  Eyes: Anicteric, PERRL  Neck: Supple, trachea midline  Lungs:  Clear to auscultation, PRVC FiO 35%  Heart: irregular  Abdomen:  Soft, nontender,   Extremities:  no peripheral edema.  Neurologic: Intubated and sedated  Skin: No lesions   GU Foley with yellow urine    Basic Metabolic Panel: Recent Labs  Lab 06/02/19 1040 06/03/19 0039 06/04/19 0652 06/04/19 0702  NA 143 157* 145  --   K 4.8 3.9 3.3*  --   CL 111 119* 118*  --   CO2 19* 29 20*  --   GLUCOSE 137* 144* 183*  --   BUN 139* 105* 68*  --   CREATININE 3.55* 2.49* 1.65*  --   CALCIUM 8.6* 7.0* 7.8*  --   MG  --  2.9*  --  2.8*  PHOS  --  5.3* 3.7 3.8    Liver Function Tests: Recent Labs  Lab 06/02/19 1040 06/04/19 0652  AST 104*  --   ALT 94*  --   ALKPHOS 245*  --   BILITOT 8.3*  --   PROT 7.5  --   ALBUMIN 2.0* 1.6*   No results for input(s): LIPASE, AMYLASE in the last 168 hours. No results for input(s): AMMONIA in the last 168 hours.  CBC: Recent Labs  Lab 06/02/19 1040 06/02/19 1040 06/03/19 0039 06/03/19 0039 06/03/19 1422 06/03/19 2023 06/04/19 0014 06/04/19 0652 06/04/19 0702  WBC 20.0*   --  16.9*  --   --   --   --   --  19.7*  NEUTROABS 15.2*  --  14.3*  --   --   --   --   --  15.7*  HGB 9.9*   < > 6.8*   < > 9.3* 9.5* 8.9* 8.9* 8.9*  HCT 26.9*   < > 18.6*   < > 26.1* 26.8* 24.1* 24.6* 24.5*  MCV 77.5*  --  78.5*  --   --   --   --   --  80.6  PLT 69*  --  52*  --   --   --   --   --  77*   < > = values in this interval not displayed.    Cardiac Enzymes: No results for input(s): CKTOTAL, CKMB, CKMBINDEX, TROPONINI in the last 168 hours.  BNP: Invalid input(s): POCBNP  CBG: Recent Labs  Lab 06/03/19 1613 06/03/19 1935 06/04/19 0005 06/04/19 0357 06/04/19 0707  GLUCAP 107* 115* 109* 109* 100*    Microbiology: Results for orders placed or performed during the hospital encounter of 06/02/19  Respiratory Panel by RT PCR (  Flu A&B, Covid) - Nasopharyngeal Swab     Status: None   Collection Time: 06/02/19 11:05 AM   Specimen: Nasopharyngeal Swab  Result Value Ref Range Status   SARS Coronavirus 2 by RT PCR NEGATIVE NEGATIVE Final    Comment: (NOTE) SARS-CoV-2 target nucleic acids are NOT DETECTED. The SARS-CoV-2 RNA is generally detectable in upper respiratoy specimens during the acute phase of infection. The lowest concentration of SARS-CoV-2 viral copies this assay can detect is 131 copies/mL. A negative result does not preclude SARS-Cov-2 infection and should not be used as the sole basis for treatment or other patient management decisions. A negative result may occur with  improper specimen collection/handling, submission of specimen other than nasopharyngeal swab, presence of viral mutation(s) within the areas targeted by this assay, and inadequate number of viral copies (<131 copies/mL). A negative result must be combined with clinical observations, patient history, and epidemiological information. The expected result is Negative. Fact Sheet for Patients:  PinkCheek.be Fact Sheet for Healthcare Providers:   GravelBags.it This test is not yet ap proved or cleared by the [place] FDA and  has been authorized for detection and/or diagnosis of SARS-CoV-2 by FDA under an Emergency Use Authorization (EUA). This EUA will remain  in effect (meaning this test can be used) for the duration of the COVID-19 declaration under Section 564(b)(1) of the Act, 21 U.S.C. section 360bbb-3(b)(1), unless the authorization is terminated or revoked sooner.    Influenza A by PCR NEGATIVE NEGATIVE Final   Influenza B by PCR NEGATIVE NEGATIVE Final    Comment: (NOTE) The Xpert Xpress SARS-CoV-2/FLU/RSV assay is intended as an aid in  the diagnosis of influenza from Nasopharyngeal swab specimens and  should not be used as a sole basis for treatment. Nasal washings and  aspirates are unacceptable for Xpert Xpress SARS-CoV-2/FLU/RSV  testing. Fact Sheet for Patients: PinkCheek.be Fact Sheet for Healthcare Providers: GravelBags.it This test is not yet approved or cleared by the [place] FDA and  has been authorized for detection and/or diagnosis of SARS-CoV-2 by  FDA under an Emergency Use Authorization (EUA). This EUA will remain  in effect (meaning this test can be used) for the duration of the  Covid-19 declaration under Section 564(b)(1) of the Act, 21  U.S.C. section 360bbb-3(b)(1), unless the authorization is  terminated or revoked. Performed at [facility], [address], [place], [place] [address]   Urine Culture     Status: Abnormal   Collection Time: 06/02/19  3:31 PM   Specimen: Urine, Random  Result Value Ref Range Status   Specimen Description   Final    URINE, RANDOM Performed at [facility], [address] [place], [place] [address]    Special Requests   Final    NONE Performed at [facility], [address] [place], [place] [address]    Culture MULTIPLE SPECIES  PRESENT, SUGGEST RECOLLECTION (A)  Final   Report Status 06/03/2019 FINAL  Final  Culture, blood (x 2)     Status: None (Preliminary result)   Collection Time: 06/02/19  5:01 PM   Specimen: BLOOD  Result Value Ref Range Status   Specimen Description BLOOD RIGHT HAND  Final   Special Requests   Final    BOTTLES DRAWN AEROBIC ONLY Blood Culture adequate volume   Culture   Final    NO GROWTH 2 DAYS Performed at [facility], [address], [place], [place] [address]    Report Status PENDING  Incomplete  Culture, blood (x 2)     Status: None (Preliminary result)   Collection Time: 06/02/19  5:01 PM   Specimen: BLOOD  Result Value Ref Range Status   Specimen Description BLOOD LEFT ASSIST CONTROL  Final   Special Requests   Final    BOTTLES DRAWN AEROBIC ONLY Blood Culture adequate volume   Culture   Final    NO GROWTH 2 DAYS Performed at [facility], [address], [place], [place] [address]    Report Status PENDING  Incomplete  MRSA PCR Screening     Status: None   Collection Time: 06/02/19  6:35 PM   Specimen: Nasopharyngeal  Result Value Ref Range Status   MRSA by PCR NEGATIVE NEGATIVE Final    Comment:        The GeneXpert MRSA Assay (FDA approved for NASAL specimens only), is one component of a comprehensive MRSA colonization surveillance program. It is not intended to diagnose MRSA infection nor to guide or monitor treatment for MRSA infections. Performed at [facility], [address], [place], [place] [address]   Urine Culture     Status: Abnormal (Preliminary result)   Collection Time: 06/02/19  9:30 PM   Specimen: PATH Other; Urine  Result Value Ref Range Status   Specimen Description   Final    URINE, CLEAN CATCH Performed at [facility], [address], [place], [place] [address]    Special Requests   Final    NONE Performed at [facility], [place], [place] [address]    Culture (A)   Final    30,000 COLONIES/mL GRAM NEGATIVE RODS IDENTIFICATION AND SUSCEPTIBILITIES TO FOLLOW Performed at [facility], [place] [address], [place], [place] [address]    Report Status PENDING  Incomplete  C Difficile Quick Screen w PCR reflex     Status: None   Collection Time: 06/02/19 10:43 PM   Specimen: STOOL  Result Value Ref Range Status   C Diff antigen NEGATIVE NEGATIVE Final   C Diff toxin NEGATIVE NEGATIVE Final   C Diff interpretation No C. difficile detected.  Final    Comment: Performed at [facility], [address] [place], [place] [address]    Coagulation Studies: Recent Labs    06/02/19 04/16/25 06/03/19 0039  LABPROT 17.6* 17.4*  INR 1.5* 1.4*    Urinalysis: Recent Labs    06/02/19 1041  COLORURINE YELLOW  LABSPEC 1.015  PHURINE 7.0  GLUCOSEU NEGATIVE  HGBUR MODERATE*  BILIRUBINUR SMALL*  KETONESUR NEGATIVE  PROTEINUR 100*  NITRITE NEGATIVE  LEUKOCYTESUR SMALL*      Imaging: CT HEAD WO CONTRAST  Result Date: 06/02/2019 CLINICAL DATA:  Dementia.  Encephalopathy.  Mental status changes. EXAM: CT HEAD WITHOUT CONTRAST TECHNIQUE: Contiguous axial images were obtained from the base of the skull through the vertex without intravenous contrast. COMPARISON:  09/10/2017 FINDINGS: Brain: Advanced generalized brain atrophy, progressive since the previous study. Extensive chronic small-vessel ischemic changes throughout the cerebral hemispheric white matter. No sign of acute infarction, mass lesion or hemorrhage. Ventricular size is considerably increased since the previous study, but seemingly in proportion to the progressive generalized atrophy. Possibility of normal pressure hydrocephalus is not excluded however. Vascular: There is atherosclerotic calcification of the major vessels at the base of the brain. Skull: Negative Sinuses/Orbits: Clear/normal Other: None IMPRESSION: Since 04/16/2017, there has been progressive generalized brain atrophy and worsening  of chronic small vessel ischemic change throughout the brain.  Ventricles are larger, which I presume relates to ex vacuo enlargement from the worsened atrophy. I cannot completely rule out the possibility of normal pressure hydrocephalus. There is no acute stroke or intracranial hemorrhage evident by CT. Electronically Signed   By: Nelson Chimes M.D.   On: 06/02/2019 16:48   US RENAL  Result Date: 06/02/2019 CLINICAL DATA:  Acute kidney injury EXAM: RENAL / URINARY TRACT ULTRASOUND COMPLETE COMPARISON:  Ultrasound 10/26/2017 FINDINGS: Right Kidney: Renal measurements: 10.8 x 3.9 x 4.3 cm = volume: 97 mL . Echogenicity within normal limits. No mass or hydronephrosis visualized. Left Kidney: Renal measurements: 10.8 x 6.1 x 4.5 cm = volume: 156 mL. Cortical echogenicity within normal limits. Moderate hydronephrosis. Echogenic foci within dilated left renal pelvis, potentially stones, these measure up to 12 mm. Visualized proximal ureter is also dilated. Bladder: Foley catheter in the bladder which is empty Other: None. IMPRESSION: 1. Moderate left hydronephrosis with proximal hydroureter. Echogenic foci within dilated left renal pelvis, probable stones. CT KUB suggested for further evaluation and to assess for distal obstruction of the left kidney 2. Normal ultrasound appearance of the right kidney Electronically Signed   By: Donavan Foil M.D.   On: 06/02/2019 17:39   DG Chest Port 1 View  Result Date: 06/02/2019 CLINICAL DATA:  Endotracheally intubated. EXAM: PORTABLE CHEST 1 VIEW COMPARISON:  Earlier this day. FINDINGS: Placement of endotracheal tube with tip at the level of the clavicular heads 5.2 cm from the carina. Persistent low lung volumes with streaky bibasilar opacities. Trace pleural effusions. Normal heart size and mediastinal contours for degree of rotation. No pulmonary edema or pneumothorax. No acute osseous abnormalities are seen. IMPRESSION: 1. Placement of endotracheal tube with tip at the  level of the clavicular heads 5.2 cm from the carina. 2. Persistent low lung volumes with streaky bibasilar opacities, favor atelectasis. Electronically Signed   By: Keith Rake M.D.   On: 06/02/2019 22:55   DG Chest Portable 1 View  Result Date: 06/02/2019 CLINICAL DATA:  Hypoxia EXAM: PORTABLE CHEST 1 VIEW COMPARISON:  03/07/2019 FINDINGS: Low lung volumes. Bibasilar opacities. No significant pleural effusion. No pneumothorax. Stable cardiomediastinal contours. IMPRESSION: Bibasilar opacities favored to reflect atelectasis. Electronically Signed   By: Macy Mis M.D.   On: 06/02/2019 10:57   ECHOCARDIOGRAM COMPLETE  Result Date: 06/04/2019    ECHOCARDIOGRAM REPORT   Patient Name:   Jamie Boyd Date of Exam: 06/03/2019 Medical Rec #:  [id number]    Height:       66.0 in Accession #:    [id number]   Weight:       170.2 lb Date of Birth:  22-Sep-1950     BSA:          1.867 m Patient Age:    58 years     BP:           92/52 mmHg Patient Gender: F            HR:           98 bpm. Exam Location:  [facility] Procedure: 2D Echo, Cardiac Doppler and Color Doppler Indications:     I48.91 Atrial fibrillation.  History:         Patient has prior history of Echocardiogram examinations, most                  recent 01/25/2017. Parkinson's Disease.  Sonographer:     Wilford Sports Rodgers-Jones Referring Phys:  [id number] Robin Searing VISSER Diagnosing Phys: Harrell Gave End  MD IMPRESSIONS  1. Left ventricular ejection fraction, by estimation, is 55 to 60%. The left ventricle has normal function. The left ventricle has no regional wall motion abnormalities. Left ventricular diastolic parameters are consistent with age-related delayed relaxation (normal).  2. Right ventricular systolic function is normal. The right ventricular size is normal. There is normal pulmonary artery systolic pressure.  3. The mitral valve is degenerative. Trivial mitral valve regurgitation. No evidence of mitral stenosis.  4. The aortic valve was not well  visualized. Aortic valve regurgitation is not visualized. Mild aortic valve stenosis. Aortic valve mean gradient measures 10.4 mmHg.  5. The inferior vena cava is normal in size with greater than 50% respiratory variability, suggesting right atrial pressure of 3 mmHg.  6. Cannot rule out small atrial level right to left shunt. FINDINGS  Left Ventricle: Left ventricular ejection fraction, by estimation, is 55 to 60%. The left ventricle has normal function. The left ventricle has no regional wall motion abnormalities. The left ventricular internal cavity size was normal in size. There is  no left ventricular hypertrophy. Left ventricular diastolic parameters are consistent with age-related delayed relaxation (normal). Right Ventricle: The right ventricular size is normal. Right vetricular wall thickness was not assessed. Right ventricular systolic function is normal. There is normal pulmonary artery systolic pressure. The tricuspid regurgitant velocity is 2.04 m/s, and with an assumed right atrial pressure of 3 mmHg, the estimated right ventricular systolic pressure is 62.0 mmHg. Left Atrium: Left atrial size was normal in size. Right Atrium: Right atrial size was normal in size. Pericardium: There is no evidence of pericardial effusion. Mitral Valve: The mitral valve is degenerative in appearance. There is mild thickening of the mitral valve leaflet(s). There is mild calcification of the mitral valve leaflet(s). Trivial mitral valve regurgitation. No evidence of mitral valve stenosis. Tricuspid Valve: The tricuspid valve is normal in structure. Tricuspid valve regurgitation is trivial. Aortic Valve: The aortic valve was not well visualized. . There is mild thickening of the aortic valve. Aortic valve regurgitation is not visualized. Mild aortic stenosis is present. Mild aortic valve annular calcification. There is mild thickening of the aortic valve. Aortic valve mean gradient measures 10.4 mmHg. Aortic valve peak  gradient measures 17.1 mmHg. Aortic valve area, by VTI measures 1.59 cm. Pulmonic Valve: The pulmonic valve was not well visualized. Pulmonic valve regurgitation is mild. No evidence of pulmonic stenosis. Aorta: The aortic root is normal in size and structure. Pulmonary Artery: The pulmonary artery is not well seen. Venous: The inferior vena cava is normal in size with greater than 50% respiratory variability, suggesting right atrial pressure of 3 mmHg. IAS/Shunts: There is redundancy of the interatrial septum. Cannot rule out small atrial level right to left shunt.  LEFT VENTRICLE PLAX 2D LVIDd:         3.83 cm  Diastology LVIDs:         2.69 cm  LV e' lateral:   9.70 cm/s LV PW:         0.87 cm  LV E/e' lateral: 7.0 LV IVS:        0.72 cm  LV e' medial:    8.70 cm/s LVOT diam:     1.90 cm  LV E/e' medial:  7.9 LV SV:         53 LV SV Index:   29 LVOT Area:     2.84 cm  RIGHT VENTRICLE RV Basal diam:  3.94 cm RV S prime:     23.60  cm/s LEFT ATRIUM             Index       RIGHT ATRIUM           Index LA diam:        3.50 cm 1.87 cm/m  RA Area:     10.50 cm LA Vol (A2C):   47.1 ml 25.22 ml/m RA Volume:   23.40 ml  12.53 ml/m LA Vol (A4C):   31.1 ml 16.66 ml/m LA Biplane Vol: 39.7 ml 21.26 ml/m  AORTIC VALVE AV Area (Vmax):    1.53 cm AV Area (Vmean):   1.50 cm AV Area (VTI):     1.59 cm AV Vmax:           207.00 cm/s AV Vmean:          151.000 cm/s AV VTI:            0.336 m AV Peak Grad:      17.1 mmHg AV Mean Grad:      10.4 mmHg LVOT Vmax:         112.00 cm/s LVOT Vmean:        79.750 cm/s LVOT VTI:          0.188 m LVOT/AV VTI ratio: 0.56  AORTA Ao Root diam: 2.70 cm MITRAL VALVE               TRICUSPID VALVE MV Area (PHT): 2.56 cm    TR Peak grad:   16.6 mmHg MV Decel Time: 296 msec    TR Vmax:        204.00 cm/s MV E velocity: 68.30 cm/s MV A velocity: 99.40 cm/s  SHUNTS MV E/A ratio:  0.69        Systemic VTI:  0.19 m                            Systemic Diam: 1.90 cm Nelva Bush MD  Electronically signed by Nelva Bush MD Signature Date/Time: 06/04/2019/6:35:48 AM    Final    CT RENAL STONE STUDY  Result Date: 06/02/2019 CLINICAL DATA:  Altered mental status. Oxygen desaturation. Recurrent UTI. Indwelling Foley catheter. EXAM: CT ABDOMEN AND PELVIS WITHOUT CONTRAST TECHNIQUE: Multidetector CT imaging of the abdomen and pelvis was performed following the standard protocol without IV contrast. COMPARISON:  CT abdomen pelvis 04/04/2017 FINDINGS: Lower chest: Small bilateral pleural effusions. There are scattered bandlike opacities in the bilateral lungs favored represent atelectasis. There is a more focal small opacity in the peripheral left lung (series 6, image 6) which could reflect atelectasis though infection not excluded. Hepatobiliary: No focal liver abnormality is seen. Unremarkable gallbladder. Pancreas: Unremarkable. No surrounding inflammatory changes. Spleen: Normal in size without focal abnormality. Adrenals/Urinary Tract: Adrenal glands are unremarkable. The left kidney is enlarged. There is moderate/severe left hydronephrosis and ureterectasis secondary to a stone in the distal left ureter measuring 7 mm. The right kidney is unremarkable. The urinary bladder is decompressed with a Foley catheter in place. Stomach/Bowel: Stomach is within normal limits. Appendix appears normal. There is moderate wall thickening in the rectum which is nonspecific but could reflect chronic inflammation. No evidence of obstruction. There are scattered colonic diverticula without evidence of diverticulitis. Vascular/Lymphatic: Moderate atherosclerotic disease of the abdominal aorta without aneurysm. Vascular patency cannot be assessed in the absence IV contrast. No lymphadenopathy. Reproductive: No adnexal masses. Other: No abdominal wall hernia or abnormality. No abdominopelvic ascites. Musculoskeletal: There is  a mild new wedge compression fracture at L1. No significant listhesis. Multilevel  degenerative disc disease throughout the lumbar spine. There are multiple lucencies throughout the right iliac bone. IMPRESSION: 1. There is moderate/severe left hydronephrosis and ureterectasis secondary to a 7 mm stone in the distal left ureter. 2. Small bilateral pleural effusions. Scattered bandlike opacities in the bilateral lungs favored represent atelectasis. There is a more focal small opacity in the peripheral left lung which could reflect atelectasis though infection not excluded. 3. Moderate wall thickening in the rectum which is nonspecific but could reflect chronic inflammation/proctitis. Colonoscopy should be considered when/if clinically appropriate. 4. Multiple small lucencies throughout the right iliac bone, favored to represent intra osseous pneumatocysts, a clinically insignificant finding. 5. Mild new wedge compression fracture at L1. No significant listhesis. Aortic Atherosclerosis (ICD10-I70.0). Electronically Signed   By: Audie Pinto M.D.   On: 06/02/2019 20:48   [place] EKG SITE RITE  Result Date: 06/04/2019 If Site Rite image not attached, placement could not be confirmed due to current cardiac rhythm.  [place] EKG SITE RITE  Result Date: 06/04/2019 If Site Rite image not attached, placement could not be confirmed due to current cardiac rhythm.    Medications:   . sodium chloride 10 mL/hr at 06/04/19 0320  . cefTRIAXone (ROCEPHIN)  IV Stopped (06/03/19 1814)  . dextrose 100 mL/hr at 06/04/19 0846  . phenylephrine (NEO-SYNEPHRINE) Adult infusion Stopped (06/04/19 0746)   . chlorhexidine gluconate (MEDLINE KIT)  15 mL Mouth Rinse BID  . Chlorhexidine Gluconate Cloth  6 each Topical Daily  . insulin aspart  0-9 Units Subcutaneous Q4H  . mouth rinse  15 mL Mouth Rinse 10 times per day  . pantoprazole (PROTONIX) IV  40 mg Intravenous Q12H   albuterol, fentaNYL (SUBLIMAZE) injection, midazolam, ondansetron (ZOFRAN) IV  Assessment/ Plan:  Ms. Charlei Ramsaran is a 68 y.o. black  female with Alzheimer's dementia, Parkinson's, who is followed by Hospice , who was admitted to [facility] on 06/02/2019 for Atrial fibrillation with RVR ([place]) [I48.91] Patient found to have left nephrolithiasis and obstruction requiring ureteral stent placement by Dr. Gloriann Loan on 4/26.   1. Acute renal failure: baseline creatinine of 1, GFR > 60 on 09/05/2018.  2. Hypernatremia: free water deficit of 1.2 liters 3. Hypotension 4. Obstructive uropathy.  3. Anemia with renal failure: status post PRBC transfusion 4. Thrombocytopenia 5. Atrial fibrillation:    Acute renal failure secondary to prerenal azotemia and obstruction  No acute indication for dialysis. Not a candidate for long term dialysis.  Hold hydrochlorothiazide and ibuprofen.  - Dextrose infusion at 33m/hr - Replace Potassium   LOS: 2 Jamie Boyd 4/28/20219:28 AM

## 2019-06-04 NOTE — Progress Notes (Signed)
The Clinical status was relayed to family in detail.  Updated and notified of patients medical condition.  Patient with Progressive multiorgan failure with very low chance of meaningful recovery despite all aggressive and optimal medical therapy.  Patient is suffering.  Family understands the situation.  They have consented and agreed to DNR status  Family are satisfied with Plan of action and management. All questions answered  Additional CC time 32 mins   Morad Tal Patricia Pesa, M.D.  [facility]  Medical Director [place] Director [facility] Cardio-Pulmonary Department

## 2019-06-05 DIAGNOSIS — Z66 Do not resuscitate: Secondary | ICD-10-CM | POA: Diagnosis not present

## 2019-06-05 DIAGNOSIS — G9341 Metabolic encephalopathy: Secondary | ICD-10-CM | POA: Diagnosis not present

## 2019-06-05 DIAGNOSIS — J9601 Acute respiratory failure with hypoxia: Secondary | ICD-10-CM | POA: Diagnosis not present

## 2019-06-05 LAB — CBC WITH DIFFERENTIAL/PLATELET
Abs Immature Granulocytes: 0.66 10*3/uL — ABNORMAL HIGH (ref 0.00–0.07)
Basophils Absolute: 0.1 10*3/uL (ref 0.0–0.1)
Basophils Relative: 1 %
Eosinophils Absolute: 0.3 10*3/uL (ref 0.0–0.5)
Eosinophils Relative: 2 %
HCT: 22 % — ABNORMAL LOW (ref 36.0–46.0)
Hemoglobin: 7.9 g/dL — ABNORMAL LOW (ref 12.0–15.0)
Immature Granulocytes: 4 %
Lymphocytes Relative: 11 %
Lymphs Abs: 1.9 10*3/uL (ref 0.7–4.0)
MCH: 28.9 pg (ref 26.0–34.0)
MCHC: 35.9 g/dL (ref 30.0–36.0)
MCV: 80.6 fL (ref 80.0–100.0)
Monocytes Absolute: 0.5 10*3/uL (ref 0.1–1.0)
Monocytes Relative: 3 %
Neutro Abs: 14.5 10*3/uL — ABNORMAL HIGH (ref 1.7–7.7)
Neutrophils Relative %: 79 %
Platelets: 130 10*3/uL — ABNORMAL LOW (ref 150–400)
RBC: 2.73 MIL/uL — ABNORMAL LOW (ref 3.87–5.11)
RDW: 15.5 % (ref 11.5–15.5)
WBC: 18 10*3/uL — ABNORMAL HIGH (ref 4.0–10.5)
nRBC: 0.3 % — ABNORMAL HIGH (ref 0.0–0.2)

## 2019-06-05 LAB — GLUCOSE, CAPILLARY
Glucose-Capillary: 117 mg/dL — ABNORMAL HIGH (ref 70–99)
Glucose-Capillary: 50 mg/dL — ABNORMAL LOW (ref 70–99)
Glucose-Capillary: 66 mg/dL — ABNORMAL LOW (ref 70–99)
Glucose-Capillary: 70 mg/dL (ref 70–99)
Glucose-Capillary: 75 mg/dL (ref 70–99)
Glucose-Capillary: 76 mg/dL (ref 70–99)
Glucose-Capillary: 86 mg/dL (ref 70–99)
Glucose-Capillary: 91 mg/dL (ref 70–99)

## 2019-06-05 LAB — URINE CULTURE: Culture: 30000 — AB

## 2019-06-05 LAB — APTT: aPTT: 40 seconds — ABNORMAL HIGH (ref 24–36)

## 2019-06-05 LAB — PROTIME-INR
INR: 1.4 — ABNORMAL HIGH (ref 0.8–1.2)
Prothrombin Time: 16.5 seconds — ABNORMAL HIGH (ref 11.4–15.2)

## 2019-06-05 MED ORDER — ORAL CARE MOUTH RINSE
15.0000 mL | Freq: Two times a day (BID) | OROMUCOSAL | Status: DC
  Administered 2019-06-05: 15 mL via OROMUCOSAL

## 2019-06-05 MED ORDER — CHLORHEXIDINE GLUCONATE 0.12 % MT SOLN
15.0000 mL | Freq: Two times a day (BID) | OROMUCOSAL | Status: DC
  Administered 2019-06-05 – 2019-06-06 (×2): 15 mL via OROMUCOSAL
  Filled 2019-06-05 (×2): qty 15

## 2019-06-05 MED ORDER — DEXTROSE 50 % IV SOLN
INTRAVENOUS | Status: AC
  Filled 2019-06-05: qty 50

## 2019-06-05 MED ORDER — DEXTROSE 50 % IV SOLN
25.0000 g | INTRAVENOUS | Status: AC
  Administered 2019-06-05: 25 g via INTRAVENOUS

## 2019-06-05 MED ORDER — DEXTROSE 10 % IV SOLN: INTRAVENOUS | Status: DC

## 2019-06-05 MED ORDER — LACTATED RINGERS IV SOLN: INTRAVENOUS | Status: DC

## 2019-06-05 NOTE — Progress Notes (Signed)
Pt extubated around 1230 this evening. VSS. Does not appear distressed. Pt not interactive and does not follow commands. Pt does respond to voice, opening her eyes.

## 2019-06-05 NOTE — Progress Notes (Signed)
CRITICAL CARE NOTE BRIEF PATIENT DESCRIPTION:  70 yo female with alzheimer's dementia admitted with sepsis, acute renal failure, and acute encephalopathy  secondary to UTI in the setting of obstructing left ureteral calculus s/p cystoscopy and left ureteral stent placement remained mechanically intubated postop.  Noted to have acute GI bleed   SIGNIFICANT EVENTS/STUDIES:  04/26: Pt admitted to the stepdown unit with sepsis and acute metabolic encephalopathy  123456: CT Renal Stone Study revealed there is moderate/severe left hydronephrosis and ureterectasis secondary to a 7 mm stone in the distal left ureter. Small bilateral pleural effusions. Scattered bandlike opacities in the bilateral lungs favored represent atelectasis. There is a more focal small opacity in the peripheral left lung which could reflect atelectasis though infection not excluded. Moderate wall thickening in the rectum which is nonspecific but could reflect chronic inflammation/proctitis. Colonoscopy should be considered when/if clinically appropriate. Multiple small lucencies throughout the right iliac bone, favored to represent intra osseous pneumatocysts, a clinically insignificant finding. Mild new wedge compression fracture at L1. No significant Listhesis. 04/26: Due to CT findings urology consulted and pt underwent emergent cystoscopy with left retrograde pyelogram and left ureteral stent placement.  She remained intubated postop PCCM team consulted for vent management 04/26: Pt noted to have watery maroon colored stool postop (according to pts niece this is new for the pt) 4/28 severe hypotensions, started on vasopressors remains vegetative state 4/29 remains vegetative, on vent   CC  Follow up resp failure  HPI Remains critically ill On v ent PVS(persisitant vegetative state) Plan for extubation at 1230 PM    BP 122/74   Pulse 78   Temp 98.2 F (36.8 C) (Axillary)   Resp 13   Ht 5\' 6"  (1.676 m)   Wt 77.2 kg    SpO2 99%   BMI 27.47 kg/m    I/O last 3 completed shifts: In: 5827.8 [I.V.:3235.1; IV Piggyback:2592.6] Out: 2775 [Urine:2775] Total I/O In: 1190.3 [I.V.:1190.3] Out: -   SpO2: 99 % O2 Flow Rate (L/min): 3 L/min FiO2 (%): 30 %  Estimated body mass index is 27.47 kg/m as calculated from the following:   Height as of this encounter: 5\' 6"  (1.676 m).   Weight as of this encounter: 77.2 kg.  REVIEW OF SYSTEMS  PATIENT IS UNABLE TO PROVIDE COMPLETE REVIEW OF SYSTEM S DUE TO SEVERE CRITICAL ILLNESS AND ENCEPHALOPATHY   PHYSICAL EXAMINATION:  GENERAL:critically ill appearing, +resp distress HEAD: Normocephalic, atraumatic.  EYES: Pupils equal, round, reactive to light.  No scleral icterus.  MOUTH: Moist mucosal membrane. NECK: Supple. No thyromegaly. No nodules. No JVD.  PULMONARY: +rhonchi, +wheezing CARDIOVASCULAR: S1 and S2. Regular rate and rhythm. No murmurs, rubs, or gallops.  GASTROINTESTINAL: Soft, nontender, -distended. Positive bowel sounds.  MUSCULOSKELETAL: No swelling, clubbing, or edema.  NEUROLOGIC: obtunded SKIN:intact,warm,dry   MEDICATIONS: I have reviewed all medications and confirmed regimen as documented   CULTURE RESULTS   Recent Results (from the past 240 hour(s))  Respiratory Panel by RT PCR (Flu A&B, Covid) - Nasopharyngeal Swab     Status: None   Collection Time: 06/02/19 11:05 AM   Specimen: Nasopharyngeal Swab  Result Value Ref Range Status   SARS Coronavirus 2 by RT PCR NEGATIVE NEGATIVE Final    Comment: (NOTE) SARS-CoV-2 target nucleic acids are NOT DETECTED. The SARS-CoV-2 RNA is generally detectable in upper respiratoy specimens during the acute phase of infection. The lowest concentration of SARS-CoV-2 viral copies this assay can detect is 131 copies/mL. A negative result does not preclude  SARS-Cov-2 infection and should not be used as the sole basis for treatment or other patient management decisions. A negative result may  occur with  improper specimen collection/handling, submission of specimen other than nasopharyngeal swab, presence of viral mutation(s) within the areas targeted by this assay, and inadequate number of viral copies (<131 copies/mL). A negative result must be combined with clinical observations, patient history, and epidemiological information. The expected result is Negative. Fact Sheet for Patients:  PinkCheek.be Fact Sheet for Healthcare Providers:  GravelBags.it This test is not yet ap proved or cleared by the [place] FDA and  has been authorized for detection and/or diagnosis of SARS-CoV-2 by FDA under an Emergency Use Authorization (EUA). This EUA will remain  in effect (meaning this test can be used) for the duration of the COVID-19 declaration under Section 564(b)(1) of the Act, 21 U.S.C. section 360bbb-3(b)(1), unless the authorization is terminated or revoked sooner.    Influenza A by PCR NEGATIVE NEGATIVE Final   Influenza B by PCR NEGATIVE NEGATIVE Final    Comment: (NOTE) The Xpert Xpress SARS-CoV-2/FLU/RSV assay is intended as an aid in  the diagnosis of influenza from Nasopharyngeal swab specimens and  should not be used as a sole basis for treatment. Nasal washings and  aspirates are unacceptable for Xpert Xpress SARS-CoV-2/FLU/RSV  testing. Fact Sheet for Patients: PinkCheek.be Fact Sheet for Healthcare Providers: GravelBags.it This test is not yet approved or cleared by the [place] FDA and  has been authorized for detection and/or diagnosis of SARS-CoV-2 by  FDA under an Emergency Use Authorization (EUA). This EUA will remain  in effect (meaning this test can be used) for the duration of the  Covid-19 declaration under Section 564(b)(1) of the Act, 21  U.S.C. section 360bbb-3(b)(1), unless the authorization is  terminated or  revoked. Performed at [facility], [address], [place], [place] [address]   Urine Culture     Status: Abnormal   Collection Time: 06/02/19  3:31 PM   Specimen: Urine, Random  Result Value Ref Range Status   Specimen Description   Final    URINE, RANDOM Performed at [facility], [address] [place], [place] [address]    Special Requests   Final    NONE Performed at [facility], [address] [place], [place] [address]    Culture MULTIPLE SPECIES PRESENT, SUGGEST RECOLLECTION (A)  Final   Report Status 06/03/2019 FINAL  Final  Culture, blood (x 2)     Status: None (Preliminary result)   Collection Time: 06/02/19  5:01 PM   Specimen: BLOOD  Result Value Ref Range Status   Specimen Description BLOOD RIGHT HAND  Final   Special Requests   Final    BOTTLES DRAWN AEROBIC ONLY Blood Culture adequate volume   Culture   Final    NO GROWTH 3 DAYS Performed at [facility], [address] [place], [place] [address]    Report Status PENDING  Incomplete  Culture, blood (x 2)     Status: None (Preliminary result)   Collection Time: 06/02/19  5:01 PM   Specimen: BLOOD  Result Value Ref Range Status   Specimen Description BLOOD LEFT ASSIST CONTROL  Final   Special Requests   Final    BOTTLES DRAWN AEROBIC ONLY Blood Culture adequate volume   Culture   Final    NO GROWTH 3 DAYS Performed at [facility], [address], [place],  [address]    Report  Status PENDING  Incomplete  MRSA PCR Screening     Status: None   Collection Time: 06/02/19  6:35 PM   Specimen: Nasopharyngeal  Result Value Ref Range Status   MRSA by PCR NEGATIVE NEGATIVE Final    Comment:        The GeneXpert MRSA Assay (FDA approved for NASAL specimens only), is one component of a comprehensive MRSA colonization surveillance program. It is not intended to diagnose MRSA infection nor to guide or monitor treatment for MRSA  infections. Performed at [facility], [address] [place], [place] [address]   Urine Culture     Status: Abnormal   Collection Time: 06/02/19  9:30 PM   Specimen: PATH Other; Urine  Result Value Ref Range Status   Specimen Description   Final    URINE, CLEAN CATCH Performed at [facility], [address] [place], [place] [address]    Special Requests   Final    NONE Performed at [facility], [place], [place] [address]    Culture 30,000 COLONIES/mL PROTEUS MIRABILIS (A)  Final   Report Status 06/05/2019 FINAL  Final   Organism ID, Bacteria PROTEUS MIRABILIS (A)  Final      Susceptibility   Proteus mirabilis - MIC*    AMPICILLIN <=2 SENSITIVE Sensitive     CEFAZOLIN <=4 SENSITIVE Sensitive     CEFTRIAXONE <=1 SENSITIVE Sensitive     CIPROFLOXACIN <=0.25 SENSITIVE Sensitive     GENTAMICIN <=1 SENSITIVE Sensitive     IMIPENEM 2 SENSITIVE Sensitive     NITROFURANTOIN 128 RESISTANT Resistant     TRIMETH/SULFA <=20 SENSITIVE Sensitive     AMPICILLIN/SULBACTAM <=2 SENSITIVE Sensitive     PIP/TAZO <=4 SENSITIVE Sensitive     * 30,000 COLONIES/mL PROTEUS MIRABILIS  C Difficile Quick Screen w PCR reflex     Status: None   Collection Time: 06/02/19 10:43 PM   Specimen: STOOL  Result Value Ref Range Status   C Diff antigen NEGATIVE NEGATIVE Final   C Diff toxin NEGATIVE NEGATIVE Final   C Diff interpretation No C. difficile detected.  Final    Comment: Performed at [facility], [address] [place], [place] [address]          ASSESSMENT AND PLAN SYNOPSIS Severe ACUTE Hypoxic and Hypercapnic Respiratory Failure POST op sepsis/septic shock  from Obstructed and infected Ureteral stone  Severe ACUTE Hypoxic and Hypercapnic Respiratory Failure -continue Mechanical Ventilator support -continue Bronchodilator Therapy -Wean Fio2 and PEEP as tolerated -VAP/VENT bundle implementation Plan for extubation at  1230PM  ACUTE KIDNEY INJURY/Renal Failure -follow chem 7 -follow UO -continue Foley Catheter-assess need -Avoid nephrotoxic agents   NEUROLOGY Avoid sedatives Vegetative state   CARDIAC ICU monitoring  INFECTIOUS DISEASE -continue antibiotics as prescribed -follow up cultures  ELECTROLYTES -follow labs as needed -replace as needed -pharmacy consultation and following    DVT/GI PRX ordered TRANSFUSIONS AS NEEDED MONITOR FSBS ASSESS the need for LABS as needed      Critical Care Time devoted to patient care services described in this note is 32 minutes.   Overall, patient is critically ill, prognosis is guarded.  Patient with Multiorgan failure and at high risk for cardiac arrest and death.    Corrin Parker, M.D.  [facility]  Medical Director [place] Director [facility] Cardio-Pulmonary Department

## 2019-06-05 NOTE — Progress Notes (Signed)
Patient ID: Breonna Arevalos, female   DOB: Jan 09, 1951, 69 y.o.   MRN: [id number]  This NP visited patient at the bedside as a follow up palliative medicine needs and emotional support.    Patient was successfully extubated today and is resting comfortably.    Patient's family at bedside to include her brother/ Cephus Shelling, SIL/Lisa and niece/Charisa Twitty.   She is nonverbal and not following commands but this is reported baseline.  Continued education and discussion regarding treatment plan, GOCs, EOL wishes disposition and options.    Discussed natural trajectory and expectations at EOL.  Family wishes to focus on comfort and dignity and agree to move forward with the goal that if patient stabilizes discharge home with hospice, she is already with [facility].  Plan of Cae -DNR/DNI -no escalation of care -no artifical feeding or hydration now or in the future/Comfort feeds/Diet as          Tolerated -avoid rehospitalizations  Discussed with family the importance of continued conversation with each other and the  medical providers regarding overall plan of care and treatment options,  ensuring decisions are within the context of the patients values and GOCs.  Questions and concerns addressed     Total time spent on the unit was 35 minutes  Greater than 50% of the time was spent in counseling and coordination of care  Wadie Lessen NP  Palliative Medicine Team Team Phone # [phone] Pager [phone]

## 2019-06-05 NOTE — Progress Notes (Signed)
[facility]  ROUNDING NOTE   Subjective:   Niece at bedside.   Plan on extubation today.   Objective:  Vital signs in last 24 hours:  Temp:  [98 F (36.7 C)-99.4 F (37.4 C)] 99.4 F (37.4 C) (04/29 1100) Pulse Rate:  [72-100] 79 (04/29 1100) Resp:  [13-26] 14 (04/29 1100) BP: (104-131)/(59-86) 111/61 (04/29 1100) SpO2:  [99 %-100 %] 99 % (04/29 1100) FiO2 (%):  [30 %] 30 % (04/29 1124)  Weight change:  Filed Weights   06/02/19 1042 06/02/19 1836  Weight: 86 kg 77.2 kg    Intake/Output: I/O last 3 completed shifts: In: 5827.8 [I.V.:3235.1; IV Piggyback:2592.6] Out: 2775 [Urine:2775]   Intake/Output this shift:  Total I/O In: 1380.3 [I.V.:1380.3] Out: -   Physical Exam: General: Critically ill  Head: ETT  Eyes: Anicteric, PERRL  Neck: Supple, trachea midline  Lungs:  Clear to auscultation, PRVC FiO 35%  Heart: irregular  Abdomen:  Soft, nontender,   Extremities:  no peripheral edema.  Neurologic: Intubated and sedated  Skin: No lesions   GU Foley with yellow urine    Basic Metabolic Panel: Recent Labs  Lab 06/02/19 1040 06/03/19 0039 06/04/19 0652 06/04/19 0702  NA 143 157* 145  --   K 4.8 3.9 3.3*  --   CL 111 119* 118*  --   CO2 19* 29 20*  --   GLUCOSE 137* 144* 183*  --   BUN 139* 105* 68*  --   CREATININE 3.55* 2.49* 1.65*  --   CALCIUM 8.6* 7.0* 7.8*  --   MG  --  2.9*  --  2.8*  PHOS  --  5.3* 3.7 3.8    Liver Function Tests: Recent Labs  Lab 06/02/19 1040 06/04/19 0652  AST 104*  --   ALT 94*  --   ALKPHOS 245*  --   BILITOT 8.3*  --   PROT 7.5  --   ALBUMIN 2.0* 1.6*   No results for input(s): LIPASE, AMYLASE in the last 168 hours. No results for input(s): AMMONIA in the last 168 hours.  CBC: Recent Labs  Lab 06/02/19 1040 06/02/19 1040 06/03/19 0039 06/03/19 0039 06/03/19 1422 06/03/19 2023 06/04/19 0014 06/04/19 0652 06/04/19 0702  WBC 20.0*  --  16.9*  --   --   --   --   --  19.7*  NEUTROABS  15.2*  --  14.3*  --   --   --   --   --  15.7*  HGB 9.9*   < > 6.8*   < > 9.3* 9.5* 8.9* 8.9* 8.9*  HCT 26.9*   < > 18.6*   < > 26.1* 26.8* 24.1* 24.6* 24.5*  MCV 77.5*  --  78.5*  --   --   --   --   --  80.6  PLT 69*  --  52*  --   --   --   --   --  77*   < > = values in this interval not displayed.    Cardiac Enzymes: No results for input(s): CKTOTAL, CKMB, CKMBINDEX, TROPONINI in the last 168 hours.  BNP: Invalid input(s): POCBNP  CBG: Recent Labs  Lab 06/04/19 2359 06/05/19 0414 06/05/19 0740 06/05/19 0820 06/05/19 1119  GLUCAP 86 91 50* 117* 79    Microbiology: Results for orders placed or performed during the hospital encounter of 06/02/19  Respiratory Panel by RT PCR (Flu A&B, Covid) - Nasopharyngeal Swab  Status: None   Collection Time: 06/02/19 11:05 AM   Specimen: Nasopharyngeal Swab  Result Value Ref Range Status   SARS Coronavirus 2 by RT PCR NEGATIVE NEGATIVE Final    Comment: (NOTE) SARS-CoV-2 target nucleic acids are NOT DETECTED. The SARS-CoV-2 RNA is generally detectable in upper respiratoy specimens during the acute phase of infection. The lowest concentration of SARS-CoV-2 viral copies this assay can detect is 131 copies/mL. A negative result does not preclude SARS-Cov-2 infection and should not be used as the sole basis for treatment or other patient management decisions. A negative result may occur with  improper specimen collection/handling, submission of specimen other than nasopharyngeal swab, presence of viral mutation(s) within the areas targeted by this assay, and inadequate number of viral copies (<131 copies/mL). A negative result must be combined with clinical observations, patient history, and epidemiological information. The expected result is Negative. Fact Sheet for Patients:  PinkCheek.be Fact Sheet for Healthcare Providers:  GravelBags.it This test is not yet ap proved  or cleared by the [place] FDA and  has been authorized for detection and/or diagnosis of SARS-CoV-2 by FDA under an Emergency Use Authorization (EUA). This EUA will remain  in effect (meaning this test can be used) for the duration of the COVID-19 declaration under Section 564(b)(1) of the Act, 21 U.S.C. section 360bbb-3(b)(1), unless the authorization is terminated or revoked sooner.    Influenza A by PCR NEGATIVE NEGATIVE Final   Influenza B by PCR NEGATIVE NEGATIVE Final    Comment: (NOTE) The Xpert Xpress SARS-CoV-2/FLU/RSV assay is intended as an aid in  the diagnosis of influenza from Nasopharyngeal swab specimens and  should not be used as a sole basis for treatment. Nasal washings and  aspirates are unacceptable for Xpert Xpress SARS-CoV-2/FLU/RSV  testing. Fact Sheet for Patients: PinkCheek.be Fact Sheet for Healthcare Providers: GravelBags.it This test is not yet approved or cleared by the [place] FDA and  has been authorized for detection and/or diagnosis of SARS-CoV-2 by  FDA under an Emergency Use Authorization (EUA). This EUA will remain  in effect (meaning this test can be used) for the duration of the  Covid-19 declaration under Section 564(b)(1) of the Act, 21  U.S.C. section 360bbb-3(b)(1), unless the authorization is  terminated or revoked. Performed at [facility], [address] [place], [place] [address]   Urine Culture     Status: Abnormal   Collection Time: 06/02/19  3:31 PM   Specimen: Urine, Random  Result Value Ref Range Status   Specimen Description   Final    URINE, RANDOM Performed at [facility], [address] [place], [place] [address]    Special Requests   Final    NONE Performed at [facility], [address] [place], [place] [address]    Culture MULTIPLE SPECIES PRESENT, SUGGEST RECOLLECTION (A)  Final   Report Status 06/03/2019 FINAL   Final  Culture, blood (x 2)     Status: None (Preliminary result)   Collection Time: 06/02/19  5:01 PM   Specimen: BLOOD  Result Value Ref Range Status   Specimen Description BLOOD RIGHT HAND  Final   Special Requests   Final    BOTTLES DRAWN AEROBIC ONLY Blood Culture adequate volume   Culture   Final    NO GROWTH 3 DAYS Performed at [facility], [address] [place], [place] [address]    Report Status PENDING  Incomplete  Culture, blood (x 2)  Status: None (Preliminary result)   Collection Time: 06/02/19  5:01 PM   Specimen: BLOOD  Result Value Ref Range Status   Specimen Description BLOOD LEFT ASSIST CONTROL  Final   Special Requests   Final    BOTTLES DRAWN AEROBIC ONLY Blood Culture adequate volume   Culture   Final    NO GROWTH 3 DAYS Performed at [facility], [address] [place], [place] [address]    Report Status PENDING  Incomplete  MRSA PCR Screening     Status: None   Collection Time: 06/02/19  6:35 PM   Specimen: Nasopharyngeal  Result Value Ref Range Status   MRSA by PCR NEGATIVE NEGATIVE Final    Comment:        The GeneXpert MRSA Assay (FDA approved for NASAL specimens only), is one component of a comprehensive MRSA colonization surveillance program. It is not intended to diagnose MRSA infection nor to guide or monitor treatment for MRSA infections. Performed at [facility], [address] [place], [place] [address]   Urine Culture     Status: Abnormal   Collection Time: 06/02/19  9:30 PM   Specimen: PATH Other; Urine  Result Value Ref Range Status   Specimen Description   Final    URINE, CLEAN CATCH Performed at [facility], [address] [place], [place] [address]    Special Requests   Final    NONE Performed at [facility], [place], [place] [address]    Culture 30,000 COLONIES/mL PROTEUS MIRABILIS (A)  Final   Report Status 06/05/2019 FINAL  Final   Organism  ID, Bacteria PROTEUS MIRABILIS (A)  Final      Susceptibility   Proteus mirabilis - MIC*    AMPICILLIN <=2 SENSITIVE Sensitive     CEFAZOLIN <=4 SENSITIVE Sensitive     CEFTRIAXONE <=1 SENSITIVE Sensitive     CIPROFLOXACIN <=0.25 SENSITIVE Sensitive     GENTAMICIN <=1 SENSITIVE Sensitive     IMIPENEM 2 SENSITIVE Sensitive     NITROFURANTOIN 128 RESISTANT Resistant     TRIMETH/SULFA <=20 SENSITIVE Sensitive     AMPICILLIN/SULBACTAM <=2 SENSITIVE Sensitive     PIP/TAZO <=4 SENSITIVE Sensitive     * 30,000 COLONIES/mL PROTEUS MIRABILIS  C Difficile Quick Screen w PCR reflex     Status: None   Collection Time: 06/02/19 10:43 PM   Specimen: STOOL  Result Value Ref Range Status   C Diff antigen NEGATIVE NEGATIVE Final   C Diff toxin NEGATIVE NEGATIVE Final   C Diff interpretation No C. difficile detected.  Final    Comment: Performed at [facility], [address] [place],  [address]    Coagulation Studies: Recent Labs    06/02/19 1927 06/03/19 0039  LABPROT 17.6* 17.4*  INR 1.5* 1.4*    Urinalysis: No results for input(s): COLORURINE, LABSPEC, PHURINE, GLUCOSEU, HGBUR, BILIRUBINUR, KETONESUR, PROTEINUR, UROBILINOGEN, NITRITE, LEUKOCYTESUR in the last 72 hours.  Invalid input(s): APPERANCEUR    Imaging: ECHOCARDIOGRAM COMPLETE  Result Date: 06/04/2019    ECHOCARDIOGRAM REPORT   Patient Name:   DAMYA COMLEY Date of Exam: 06/03/2019 Medical Rec #:  [id number]    Height:       66.0 in Accession #:    [id number]   Weight:       170.2 lb Date of Birth:  Sep 25, 1950     BSA:          1.867 m Patient Age:    40  years     BP:           92/52 mmHg Patient Gender: F            HR:           98 bpm. Exam Location:  [facility] Procedure: 2D Echo, Cardiac Doppler and Color Doppler Indications:     I48.91 Atrial fibrillation.  History:         Patient has prior history of Echocardiogram examinations, most                  recent 01/25/2017. Parkinson's Disease.  Sonographer:      Wilford Sports Rodgers-Jones Referring Phys:  [id number] Arvil Chaco Diagnosing Phys: Nelva Bush MD IMPRESSIONS  1. Left ventricular ejection fraction, by estimation, is 55 to 60%. The left ventricle has normal function. The left ventricle has no regional wall motion abnormalities. Left ventricular diastolic parameters are consistent with age-related delayed relaxation (normal).  2. Right ventricular systolic function is normal. The right ventricular size is normal. There is normal pulmonary artery systolic pressure.  3. The mitral valve is degenerative. Trivial mitral valve regurgitation. No evidence of mitral stenosis.  4. The aortic valve was not well visualized. Aortic valve regurgitation is not visualized. Mild aortic valve stenosis. Aortic valve mean gradient measures 10.4 mmHg.  5. The inferior vena cava is normal in size with greater than 50% respiratory variability, suggesting right atrial pressure of 3 mmHg.  6. Cannot rule out small atrial level right to left shunt. FINDINGS  Left Ventricle: Left ventricular ejection fraction, by estimation, is 55 to 60%. The left ventricle has normal function. The left ventricle has no regional wall motion abnormalities. The left ventricular internal cavity size was normal in size. There is  no left ventricular hypertrophy. Left ventricular diastolic parameters are consistent with age-related delayed relaxation (normal). Right Ventricle: The right ventricular size is normal. Right vetricular wall thickness was not assessed. Right ventricular systolic function is normal. There is normal pulmonary artery systolic pressure. The tricuspid regurgitant velocity is 2.04 m/s, and with an assumed right atrial pressure of 3 mmHg, the estimated right ventricular systolic pressure is 54.6 mmHg. Left Atrium: Left atrial size was normal in size. Right Atrium: Right atrial size was normal in size. Pericardium: There is no evidence of pericardial effusion. Mitral Valve: The mitral  valve is degenerative in appearance. There is mild thickening of the mitral valve leaflet(s). There is mild calcification of the mitral valve leaflet(s). Trivial mitral valve regurgitation. No evidence of mitral valve stenosis. Tricuspid Valve: The tricuspid valve is normal in structure. Tricuspid valve regurgitation is trivial. Aortic Valve: The aortic valve was not well visualized. . There is mild thickening of the aortic valve. Aortic valve regurgitation is not visualized. Mild aortic stenosis is present. Mild aortic valve annular calcification. There is mild thickening of the aortic valve. Aortic valve mean gradient measures 10.4 mmHg. Aortic valve peak gradient measures 17.1 mmHg. Aortic valve area, by VTI measures 1.59 cm. Pulmonic Valve: The pulmonic valve was not well visualized. Pulmonic valve regurgitation is mild. No evidence of pulmonic stenosis. Aorta: The aortic root is normal in size and structure. Pulmonary Artery: The pulmonary artery is not well seen. Venous: The inferior vena cava is normal in size with greater than 50% respiratory variability, suggesting right atrial pressure of 3 mmHg. IAS/Shunts: There is redundancy of the interatrial septum. Cannot rule out small atrial level right to left shunt.  LEFT VENTRICLE PLAX 2D LVIDd:  3.83 cm  Diastology LVIDs:         2.69 cm  LV e' lateral:   9.70 cm/s LV PW:         0.87 cm  LV E/e' lateral: 7.0 LV IVS:        0.72 cm  LV e' medial:    8.70 cm/s LVOT diam:     1.90 cm  LV E/e' medial:  7.9 LV SV:         53 LV SV Index:   29 LVOT Area:     2.84 cm  RIGHT VENTRICLE RV Basal diam:  3.94 cm RV S prime:     23.60 cm/s LEFT ATRIUM             Index       RIGHT ATRIUM           Index LA diam:        3.50 cm 1.87 cm/m  RA Area:     10.50 cm LA Vol (A2C):   47.1 ml 25.22 ml/m RA Volume:   23.40 ml  12.53 ml/m LA Vol (A4C):   31.1 ml 16.66 ml/m LA Biplane Vol: 39.7 ml 21.26 ml/m  AORTIC VALVE AV Area (Vmax):    1.53 cm AV Area (Vmean):    1.50 cm AV Area (VTI):     1.59 cm AV Vmax:           207.00 cm/s AV Vmean:          151.000 cm/s AV VTI:            0.336 m AV Peak Grad:      17.1 mmHg AV Mean Grad:      10.4 mmHg LVOT Vmax:         112.00 cm/s LVOT Vmean:        79.750 cm/s LVOT VTI:          0.188 m LVOT/AV VTI ratio: 0.56  AORTA Ao Root diam: 2.70 cm MITRAL VALVE               TRICUSPID VALVE MV Area (PHT): 2.56 cm    TR Peak grad:   16.6 mmHg MV Decel Time: 296 msec    TR Vmax:        204.00 cm/s MV E velocity: 68.30 cm/s MV A velocity: 99.40 cm/s  SHUNTS MV E/A ratio:  0.69        Systemic VTI:  0.19 m                            Systemic Diam: 1.90 cm Nelva Bush MD Electronically signed by Nelva Bush MD Signature Date/Time: 06/04/2019/6:35:48 AM    Final    [place] EKG SITE RITE  Result Date: 06/04/2019 If Site Rite image not attached, placement could not be confirmed due to current cardiac rhythm.  [place] EKG SITE RITE  Result Date: 06/04/2019 If Site Rite image not attached, placement could not be confirmed due to current cardiac rhythm.    Medications:   . sodium chloride 10 mL/hr at 06/04/19 0320  . cefTRIAXone (ROCEPHIN)  IV Stopped (06/04/19 1742)  . dextrose 100 mL/hr at 06/05/19 1154   . chlorhexidine gluconate (MEDLINE KIT)  15 mL Mouth Rinse BID  . Chlorhexidine Gluconate Cloth  6 each Topical Daily  . insulin aspart  0-9 Units Subcutaneous Q4H  . mouth rinse  15 mL Mouth Rinse 10 times per  day  . pantoprazole (PROTONIX) IV  40 mg Intravenous BID   albuterol, fentaNYL (SUBLIMAZE) injection, midazolam, ondansetron (ZOFRAN) IV  Assessment/ Plan:  Ms. Sharina Petre is a 69 y.o. black female with Alzheimer's dementia, Parkinson's, who is followed by Hospice , who was admitted to [facility] on 06/02/2019 for Atrial fibrillation with RVR ([place]) [I48.91] Patient found to have left nephrolithiasis and obstruction requiring ureteral stent placement by Dr. Gloriann Loan on 4/26.   1. Acute renal failure: baseline creatinine  of 1, GFR > 60 on 09/05/2018.  2. Hypernatremia:   3. Hypotension 4. Obstructive uropathy.  3. Anemia with renal failure: status post PRBC transfusion 4. Thrombocytopenia 5. Atrial fibrillation:    Acute renal failure secondary to prerenal azotemia and obstruction  No acute indication for dialysis. Not a candidate for long term dialysis.  Hold hydrochlorothiazide and ibuprofen.  - Dextrose infusion at 1110m/hr   LOS: 3 Jamie Boyd 4/29/202112:03 PM

## 2019-06-05 NOTE — Progress Notes (Signed)
[facility] Room IC09 [facility]) Hospitalized Hospice Patient RN note  Jamie Boyd is a current hospice patient with a terminal diagnosis of Parkinson's disease. Home hospice case manager visited with patient and family the day of admission for concerns of decreased responsiveness and patient not eating or drinking. Family requested patient to be transferred to the emergency department for evaluation. Patient does have an OOF DNR and MOST Form. Patient was admitted to [facility] on 4.26.21 with a diagnosis of acute metabolic encephalopathy. Per Dr. Gilford Rile with [facility], this is a related hospital admission.   Met with niece, Jamie Boyd, brother Jamie Boyd and sister-in-law Jamie Boyd in the room along with chaplain Jamie Boyd,  RT Jamie Boyd and bedside RN Jamie Boyd to offer support during extubation. Patient was extubated to room air without incident. Family sharing many stories and pictures of Jamie Boyd. Emotional support provided. Jamie Boyd opens her eyes spontaneously and did turn her eyes to look at me when her name was called. She does not follow commands nor make effort to communicate. No movement of any extremities noted. She does not appear to be in pain. She looks comfortable and occasionally closes her eyes and appears to drift off to sleep.  VS: T 99.4 axillary, BP 111/61, HR 79, RR 13, O2 Sats 97% on room air I/O: 2987/1725   No new lab work. No new diagnostics.  IV's/PRN's: Ceftriaxone 2 Gm IV daily, Dextrose 5% solution at 100 cc/hr, Fentanyl 50 mcg IV X 1, Versed 1 mg IV X 1.  Assessment/Plan:  Pt extubated today. Family wishes to have swallowing evaluation to determine next steps toward discharge. Dr. Mortimer Fries notified.  Family Contact: as above IDT: updated Goals of Care: Remains DNR. Family unsure at this time how they wish to proceed with future care in regards to Boyd stone and patient's ability to eat.    Please call with any hospice related questions or concerns.  Thank you. Jamie Boyd, BSN, RN [facility] Liaison [phone]

## 2019-06-05 NOTE — Progress Notes (Signed)
   06/05/19 1900  Clinical Encounter Type  Visited With Patient and family together  Visit Type Follow-up;Psychological support  Referral From Chaplain;Palliative care team  Consult/Referral To Chaplain  Spiritual Encounters  Spiritual Needs Emotional   Jamie Boyd visited with PT and family. Family member [name]) stated that the PT was extubated and that her O2 levels and heart rate are doing well. [name] is expecting a miracle for the PT. [facility] offered ministry of presence for PT and [facility].

## 2019-06-05 NOTE — Progress Notes (Signed)
[place] visited pt. per request from [facility] and palliative care team; pt. in bed intubated but not sedated acc. to RN.  Niece Jamie Boyd @ bedside.  Niece shared family located pt. five yrs. ago living alone w/dementia near [place]; pt. was malnourished and anemic --> they brought her to live w/them in [place].  Per conversation w/RN yesterday, family have done well as caregivers (no bedsores, pt. appears well nourished, etc.).  Niece spoke to pt. and pt. opened her eyes and seemed aware of niece's presence.  Niece has been calling family members and letting them talk to pt. on speakerphone in the last few days. Pt. scheduled for extubation @ 12:30.  Pt.'s brother Jamie Boyd and sister-in-law Jamie Boyd arrived and were present for extubation.  Family remarked that pt. appears more calm today.  Pt. remained stable and visibly calm following extubation as well.  Hospice RN present w/family in rm. as well; family is aware of residential hospice as option if pt. not medically able to be discharged home.  [facility] provided ministry of presence and talked a little while with family members and then excused himself.  No further needs expressed at this time, but [facility] will monitor.    06/05/19 1215  Clinical Encounter Type  Visited With Patient and family together;Health care provider  Visit Type Follow-up;Psychological support;Social support;Critical Care  Referral From Chaplain;Palliative care team;Nurse  Consult/Referral To Chaplain  Spiritual Encounters  Spiritual Needs Emotional  Stress Factors  Family Stress Factors Health changes;Major life changes

## 2019-06-05 NOTE — Progress Notes (Signed)
Patient extubated to room air per MD order.

## 2019-06-06 DIAGNOSIS — R4182 Altered mental status, unspecified: Secondary | ICD-10-CM

## 2019-06-06 DIAGNOSIS — R6521 Severe sepsis with septic shock: Secondary | ICD-10-CM

## 2019-06-06 DIAGNOSIS — Z7189 Other specified counseling: Secondary | ICD-10-CM

## 2019-06-06 DIAGNOSIS — G9341 Metabolic encephalopathy: Secondary | ICD-10-CM

## 2019-06-06 LAB — GLUCOSE, CAPILLARY
Glucose-Capillary: 83 mg/dL (ref 70–99)
Glucose-Capillary: 61 mg/dL — ABNORMAL LOW (ref 70–99)
Glucose-Capillary: 71 mg/dL (ref 70–99)
Glucose-Capillary: 73 mg/dL (ref 70–99)
Glucose-Capillary: 48 mg/dL — ABNORMAL LOW (ref 70–99)
Glucose-Capillary: 67 mg/dL — ABNORMAL LOW (ref 70–99)
Glucose-Capillary: 48 mg/dL — ABNORMAL LOW (ref 70–99)

## 2019-06-06 LAB — BASIC METABOLIC PANEL
Anion gap: 4 — ABNORMAL LOW (ref 5–15)
BUN: 25 mg/dL — ABNORMAL HIGH (ref 8–23)
CO2: 23 mmol/L (ref 22–32)
Calcium: 7.9 mg/dL — ABNORMAL LOW (ref 8.9–10.3)
Chloride: 112 mmol/L — ABNORMAL HIGH (ref 98–111)
Creatinine, Ser: 1.18 mg/dL — ABNORMAL HIGH (ref 0.44–1.00)
GFR calc Af Amer: 54 mL/min — ABNORMAL LOW (ref >60)
GFR calc non Af Amer: 47 mL/min — ABNORMAL LOW (ref >60)
Glucose, Bld: 116 mg/dL — ABNORMAL HIGH (ref 70–99)
Potassium: 3.2 mmol/L — ABNORMAL LOW (ref 3.5–5.1)
Sodium: 139 mmol/L (ref 135–145)

## 2019-06-06 LAB — CBC WITH DIFFERENTIAL/PLATELET
Abs Immature Granulocytes: 0.53 10*3/uL — ABNORMAL HIGH (ref 0.00–0.07)
Basophils Absolute: 0.1 10*3/uL (ref 0.0–0.1)
Basophils Relative: 1 %
Eosinophils Absolute: 0.2 10*3/uL (ref 0.0–0.5)
Eosinophils Relative: 2 %
HCT: 20.4 % — ABNORMAL LOW (ref 36.0–46.0)
Hemoglobin: 7.4 g/dL — ABNORMAL LOW (ref 12.0–15.0)
Immature Granulocytes: 3 %
Lymphocytes Relative: 12 %
Lymphs Abs: 1.9 10*3/uL (ref 0.7–4.0)
MCH: 28.8 pg (ref 26.0–34.0)
MCHC: 36.3 g/dL — ABNORMAL HIGH (ref 30.0–36.0)
MCV: 79.4 fL — ABNORMAL LOW (ref 80.0–100.0)
Monocytes Absolute: 0.6 10*3/uL (ref 0.1–1.0)
Monocytes Relative: 4 %
Neutro Abs: 12.4 10*3/uL — ABNORMAL HIGH (ref 1.7–7.7)
Neutrophils Relative %: 78 %
Platelets: 131 10*3/uL — ABNORMAL LOW (ref 150–400)
RBC: 2.57 MIL/uL — ABNORMAL LOW (ref 3.87–5.11)
RDW: 16 % — ABNORMAL HIGH (ref 11.5–15.5)
WBC: 15.8 10*3/uL — ABNORMAL HIGH (ref 4.0–10.5)
nRBC: 0.4 % — ABNORMAL HIGH (ref 0.0–0.2)

## 2019-06-06 LAB — HEMOGLOBIN AND HEMATOCRIT, BLOOD
HCT: 21.4 % — ABNORMAL LOW (ref 36.0–46.0)
Hemoglobin: 7.7 g/dL — ABNORMAL LOW (ref 12.0–15.0)

## 2019-06-06 LAB — PHOSPHORUS: Phosphorus: 3.2 mg/dL (ref 2.5–4.6)

## 2019-06-06 LAB — MAGNESIUM: Magnesium: 1.9 mg/dL (ref 1.7–2.4)

## 2019-06-06 MED ORDER — POTASSIUM CHLORIDE 10 MEQ/100ML IV SOLN
10.0000 meq | INTRAVENOUS | Status: DC
  Administered 2019-06-06: 10 meq via INTRAVENOUS
  Filled 2019-06-06 (×4): qty 100

## 2019-06-06 MED ORDER — DEXTROSE 10 % IV SOLN: INTRAVENOUS | Status: DC

## 2019-06-06 MED ORDER — KCL-LACTATED RINGERS-D5W 20 MEQ/L IV SOLN
INTRAVENOUS | Status: DC
  Filled 2019-06-06 (×4): qty 1000

## 2019-06-06 MED ORDER — GLUCOSE 40 % PO GEL
ORAL | Status: AC
  Filled 2019-06-06: qty 1

## 2019-06-06 MED ORDER — ORAL CARE MOUTH RINSE: 15.0000 mL | Freq: Two times a day (BID) | OROMUCOSAL | Status: DC

## 2019-06-06 MED ORDER — DEXTROSE 50 % IV SOLN
12.5000 g | INTRAVENOUS | Status: AC
  Administered 2019-06-06: 12.5 g via INTRAVENOUS
  Filled 2019-06-06: qty 50

## 2019-06-06 NOTE — Progress Notes (Signed)
Pt in no acute distress. VSS. EMS arrived to transport patient home with hospice. Chronic foley in place and emptied prior to transport. Conni Slipper called and informed pt en route. Attempted to call Danielle Dess to inform but no answer and voice mailbox full.

## 2019-06-06 NOTE — TOC Progression Note (Addendum)
Transition of Care [facility]) - Progression Note    Patient Details  Name: Jamie Boyd MRN: [id number] Date of Birth: 1950-06-11  Transition of Care [facility]) CM/SW [place], LCSW Phone Number: 06/06/2019, 11:36 AM  Clinical Narrative:   Per rounds and chart review, patient to discharge home with hospice services today after her ST evaluation. CSW spoke with [place] who confirmed this plan. CSW attempted calls to family members Lattie Haw and [name], was unable to reach out leave voicemails for either. Per Palliative NP, patient was followed by [facility] for Outpatient Hospice prior to this admission and family wants her to go home on comfort care with hospice services. CSW will continue to follow for any needs and will set up transport when patient is ready to discharge.   1:50- Followed up with SLP Belenda Cruise who said she saw patient around lunch time today. Updates provided to [place] and MD. Inquired about anything else pending before patient can discharge home.    Lovena Neighbours [phone]     Expected Discharge Plan: [place]    Expected Discharge Plan and Services Expected Discharge Plan: [place] arrangements for the past 2 months: Single Family Home                                       Social Determinants of Health (SDOH) Interventions    Readmission Risk Interventions No flowsheet data found.

## 2019-06-06 NOTE — Progress Notes (Signed)
Daily Progress Note   Patient Name: Jamie Boyd       Date: 06/06/2019 DOB: December 21, 1950  Age: 69 y.o. MRN#: [id number] Attending Physician: Karie Kirks, DO Primary Care Physician: Sharyne Peach, MD Admit Date: 06/02/2019  Reason for Consultation/Follow-up: Establishing goals of care  Subjective: Chart reviewed and updates received from RN.   Patient resting. Will open eyes on verbal command and spontaneously. No verbal response. Continues to have low blood sugars despite IV fluids including dextrose. Hypotensive. Successfully extubated yesterday and appears comfortable in no acute distress.   Family (son, [name] and niece, [name]) are at the bedside during my visit. Updates provided.  Discussed at length with family regarding reported episode of stool overnight which contained bright red blood, patient's hypoglycemic episodes, her hypotension, poor po intake, and overall decline. Education provided on comfort care and hospice. Family and I discussed at length patient's poor nutritional state. Son shared patient required feedings via syringe in the home prior to admission. Education provided on comfort feeds with awareness patient may not take in the necessary nutritional intake needed to support normalize blood sugars or daily nutritional needs. Family verbalized understanding and verbalized wishes to have SLP proceed with seeing patient and making further recommendations that would be useful for family in the home and provide guidance in the setting patient did have a desire to eat. Son and niece both verbalized understanding of comfort and awareness if patient did not show interest in eating the goal is for her to be comfortable and would understand. I discussed patient receiving dextrose with blood  sugars remaining low. Niece verbalized understanding expressing her challenges as a diabetic. [name], also verbalized understanding expressing family wants patient to be with them as long as she can but knowing God will take her when he is ready regardless of how they may feel. Support given.   Detailed discussion regarding patient's known kidney stone and understanding that patient appears to be comfortable and in no distress or pain. Family confirms no interventions or aggressive care needed at this time. We reviewed lab work particularly hemoglobin and understanding that patient could possibly continue to show signs of GI bleed however due to frailty and previous discussions with GI, [name] acknowledges patient is to frail for interventions such as colonoscopy and risk would outweigh the benefits of procedure.  Family verbalizes again goal of comfort and patient not suffering.    Family shares they have received updates and aware patient could potentially discharge home later today if no further recommendations once seen by SLP. Support given. We discussed hospice briefly as son, reports they are already established with [facility] services in [place] and plans to return home with their support. He acknowledges options for residential hospice however, family all want her home and confirms she has plenty of support to make this happen. They are anticipating patient discharging home once seen by SLP and per attending.   All questions answered and support given.   1020: Olivia Mackie, RN [facility] Liaison) available. I accompanied her at the bedside as she followed up with family, confirmed with son and niece their goals to return home with continued support from [facility] hospice with a goal of comfort and no rehospitalization. Family confirmed wishes and acknowledge awareness of patient's current illness and co-morbidities with a plan for no aggressive medical interventions. Family confirms they have all medical  equipment needed in the home.   Length of Stay: 4  Vital Signs: BP (!) 96/53   Pulse 86   Temp 98.8 F (37.1 C) (Axillary)   Resp 16   Ht 5\' 6"  (1.676 m)   Wt 77.2 kg   SpO2 99%   BMI 27.47 kg/m  SpO2: SpO2: 99 % O2 Device: O2 Device: Room Air O2 Flow Rate: O2 Flow Rate (L/min): 3 L/min  Palliative Care Assessment & Plan   Recommendations/Plan:  DNR/DNI  No escalation of care   Discussed at length nutrition, GI bleed, kidney stones. No aggressive interventions, family aware of frailty and risk. Expressed goals of care is for comfort in the home. Not interested in facility placement (residential hospice)  Home with family and continued support from [facility] hospice. No rehospitalizations. Most likely discharging per reports later today.   Family request to proceed with SLP evaluation to provide further guidance and support on comfort feeds and precautions.   Goals of Care and Additional Recommendations:  Limitations on Scope of Treatment: No escalation of care, goal of home with hospice and comfort care   Discharge Planning:  Home with Hospice  Care plan was discussed with patient's family, Dr. Lanney Gins, RN, and Olivia Mackie, RN [facility] Liaison).   Thank you for allowing the Palliative Medicine Team to assist in the care of this patient.  Time Total: 55 min.   Visit consisted of counseling and education dealing with the complex and emotionally intense issues of symptom management and palliative care in the setting of serious and potentially life-threatening illness.Greater than 50%  of this time was spent counseling and coordinating care related to the above assessment and plan.  Alda Lea, AGPCNP-BC  Palliative Medicine Team [phone]   Please contact Palliative Medicine Team phone at [phone] for questions and concerns.

## 2019-06-06 NOTE — Progress Notes (Signed)
Nutrition Follow-up  DOCUMENTATION CODES:   Not applicable  INTERVENTION:  Provide Hormel Shake po BID with lunch and dinner, each supplement provides 520 kcal and 22 grams of protein.  Provide Magic cup po BID with lunch and dinner, each supplement provides 290 kcal and 9 grams of protein.  NUTRITION DIAGNOSIS:   Inadequate oral intake related to inability to eat as evidenced by NPO status.  Resolving - diet now advanced.  GOAL:   Patient will meet greater than or equal to 90% of their needs  Progressing.  MONITOR:   Vent status, Labs, Weight trends, I & O's  REASON FOR ASSESSMENT:   Ventilator    ASSESSMENT:   69 year old female with PMHx of Alzheimer's dementia, Parkinson's disease admitted with sepsis, AKI, acute encephalopathy, UTI in the setting of obstructing left ureteral calculus s/p cystoscopy and left ureteral stent placement on 4/26 who remained mechanically intubated post-operatively.  Patient was extubated yesterday. Following SLP evaluation this morning diet was advanced to dysphagia 1 with nectar-thick liquids. Per discussion with SLP patient does not like oral nutrition supplements such as Ensure but does like Magic Cup and may also tolerate Hormel Shake. Plan is for patient to discharge home with hospice services. No family members at bedside during RD follow-up assessment today. Patient was sitting up in bed and appeared comfortable. She was awake but unable to answer any questions.  Medications reviewed and include: Novolog 0-9 units Q4hrs, Protonix, ceftriaxone, D5 in LR with KCl 20 mEq/L at 100 mL/hr.  Labs reviewed: CBG 61-83, Potassium 3.2, Chloride 112, BUN 25.  Diet Order:   Diet Order            DIET - DYS 1 Room service appropriate? Yes with Assist; Fluid consistency: Nectar Thick  Diet effective now             EDUCATION NEEDS:   No education needs have been identified at this time  Skin:  Skin Assessment: Reviewed RN  Assessment(ecchymosis)  Last BM:  06/05/2019 type 7  Height:   Ht Readings from Last 1 Encounters:  06/02/19 5\' 6"  (1.676 m)   Weight:   Wt Readings from Last 1 Encounters:  06/02/19 77.2 kg   BMI:  Body mass index is 27.47 kg/m.  Estimated Nutritional Needs:   Kcal:  1800-2000  Protein:  95-105 grams  Fluid:  1.9 L/day  Jacklynn Barnacle, MS, RD, LDN Pager number available on Amion

## 2019-06-06 NOTE — TOC Transition Note (Addendum)
Transition of Care [facility]) - CM/SW Discharge Note   Patient Details  Name: Talayna Perrault MRN: [id number] Date of Birth: 20-Jun-1950  Transition of Care [facility]) CM/SW Contact:  Magnus Ivan, LCSW Phone Number: 06/06/2019, 3:57 PM   Clinical Narrative:   Plan for patient to discharge home today with hospice. Comfort care. Spoke with niece [name] at bedside who confirmed family's plan is home with [facility] services. Confirmed home address for EMS transport. EMS paperwork completed and placed by chart with [facility] [facility]. RN will need to call EMS for transport if it is after 5 pm, RN Raquel Sarna aware. [name] denied any needs at home. She did have questions about patient's hospital bed which CSW relayed to [place] for follow up. Olivia Mackie reported Hospice will follow up about this once patient gets home. Encouraged [name] or other family to call with any questions or additional needs.     Final next level of care: Home w Hospice Care     Patient Goals and CMS Choice     Choice offered to / list presented to : (Patient was already active with [facility] for Hospice Services.)  Discharge Placement                       Discharge Plan and Services                                     Social Determinants of Health (SDOH) Interventions     Readmission Risk Interventions No flowsheet data found.

## 2019-06-06 NOTE — Progress Notes (Signed)
[facility]  ROUNDING NOTE   Subjective:   Niece at bedside.   Extubated yesterday.   Objective:  Vital signs in last 24 hours:  Temp:  [97.9 F (36.6 C)-99.4 F (37.4 C)] 99.4 F (37.4 C) (04/30 1130) Pulse Rate:  [77-90] 88 (04/30 1130) Resp:  [11-17] 13 (04/30 1130) BP: (96-139)/(53-67) 101/58 (04/30 1130) SpO2:  [95 %-99 %] 96 % (04/30 1130)  Weight change:  Filed Weights   06/02/19 1042 06/02/19 1836  Weight: 86 kg 77.2 kg    Intake/Output: I/O last 3 completed shifts: In: 2505.6 [I.V.:2393.9; IV Piggyback:111.8] Out: [id number]   Intake/Output this shift:  Total I/O In: 953.4 [I.V.:870.9; IV Piggyback:82.5] Out: -   Physical Exam: General: Critically ill  Head: ETT  Eyes: Anicteric, PERRL  Neck: Supple, trachea midline  Lungs:  Clear to auscultation  Heart: irregular  Abdomen:  Soft, nontender,   Extremities:  no peripheral edema.  Neurologic: Intubated and sedated  Skin: No lesions   GU Foley with yellow urine    Basic Metabolic Panel: Recent Labs  Lab 06/02/19 1040 06/02/19 1040 06/03/19 0039 06/04/19 0652 06/04/19 0702 06/06/19 0229  NA 143  --  157* 145  --  139  K 4.8  --  3.9 3.3*  --  3.2*  CL 111  --  119* 118*  --  112*  CO2 19*  --  29 20*  --  23  GLUCOSE 137*  --  144* 183*  --  116*  BUN 139*  --  105* 68*  --  25*  CREATININE 3.55*  --  2.49* 1.65*  --  1.18*  CALCIUM 8.6*   < > 7.0* 7.8*  --  7.9*  MG  --   --  2.9*  --  2.8* 1.9  PHOS  --   --  5.3* 3.7 3.8 3.2   < > = values in this interval not displayed.    Liver Function Tests: Recent Labs  Lab 06/02/19 1040 06/04/19 0652  AST 104*  --   ALT 94*  --   ALKPHOS 245*  --   BILITOT 8.3*  --   PROT 7.5  --   ALBUMIN 2.0* 1.6*   No results for input(s): LIPASE, AMYLASE in the last 168 hours. No results for input(s): AMMONIA in the last 168 hours.  CBC: Recent Labs  Lab 06/02/19 1040 06/02/19 1040 06/03/19 0039 06/03/19 1422  06/04/19 0652 06/04/19 0702 06/05/19 1957 06/06/19 0229 06/06/19 1201  WBC 20.0*  --  16.9*  --   --  19.7* 18.0* 15.8*  --   NEUTROABS 15.2*  --  14.3*  --   --  15.7* 14.5* 12.4*  --   HGB 9.9*   < > 6.8*   < > 8.9* 8.9* 7.9* 7.4* 7.7*  HCT 26.9*   < > 18.6*   < > 24.6* 24.5* 22.0* 20.4* 21.4*  MCV 77.5*  --  78.5*  --   --  80.6 80.6 79.4*  --   PLT 69*  --  52*  --   --  77* 130* 131*  --    < > = values in this interval not displayed.    Cardiac Enzymes: No results for input(s): CKTOTAL, CKMB, CKMBINDEX, TROPONINI in the last 168 hours.  BNP: Invalid input(s): POCBNP  CBG: Recent Labs  Lab 06/05/19 2341 06/06/19 0401 06/06/19 0712 06/06/19 0812 06/06/19 1126  GLUCAP 66* 83 61* 71 73    Microbiology:  Results for orders placed or performed during the hospital encounter of 06/02/19  Respiratory Panel by RT PCR (Flu A&B, Covid) - Nasopharyngeal Swab     Status: None   Collection Time: 06/02/19 11:05 AM   Specimen: Nasopharyngeal Swab  Result Value Ref Range Status   SARS Coronavirus 2 by RT PCR NEGATIVE NEGATIVE Final    Comment: (NOTE) SARS-CoV-2 target nucleic acids are NOT DETECTED. The SARS-CoV-2 RNA is generally detectable in upper respiratoy specimens during the acute phase of infection. The lowest concentration of SARS-CoV-2 viral copies this assay can detect is 131 copies/mL. A negative result does not preclude SARS-Cov-2 infection and should not be used as the sole basis for treatment or other patient management decisions. A negative result may occur with  improper specimen collection/handling, submission of specimen other than nasopharyngeal swab, presence of viral mutation(s) within the areas targeted by this assay, and inadequate number of viral copies (<131 copies/mL). A negative result must be combined with clinical observations, patient history, and epidemiological information. The expected result is Negative. Fact Sheet for Patients:   PinkCheek.be Fact Sheet for Healthcare Providers:  GravelBags.it This test is not yet ap proved or cleared by the [place] FDA and  has been authorized for detection and/or diagnosis of SARS-CoV-2 by FDA under an Emergency Use Authorization (EUA). This EUA will remain  in effect (meaning this test can be used) for the duration of the COVID-19 declaration under Section 564(b)(1) of the Act, 21 U.S.C. section 360bbb-3(b)(1), unless the authorization is terminated or revoked sooner.    Influenza A by PCR NEGATIVE NEGATIVE Final   Influenza B by PCR NEGATIVE NEGATIVE Final    Comment: (NOTE) The Xpert Xpress SARS-CoV-2/FLU/RSV assay is intended as an aid in  the diagnosis of influenza from Nasopharyngeal swab specimens and  should not be used as a sole basis for treatment. Nasal washings and  aspirates are unacceptable for Xpert Xpress SARS-CoV-2/FLU/RSV  testing. Fact Sheet for Patients: PinkCheek.be Fact Sheet for Healthcare Providers: GravelBags.it This test is not yet approved or cleared by the [place] FDA and  has been authorized for detection and/or diagnosis of SARS-CoV-2 by  FDA under an Emergency Use Authorization (EUA). This EUA will remain  in effect (meaning this test can be used) for the duration of the  Covid-19 declaration under Section 564(b)(1) of the Act, 21  U.S.C. section 360bbb-3(b)(1), unless the authorization is  terminated or revoked. Performed at [facility], [address] [place], [place] [address]   Urine Culture     Status: Abnormal   Collection Time: 06/02/19  3:31 PM   Specimen: Urine, Random  Result Value Ref Range Status   Specimen Description   Final    URINE, RANDOM Performed at [facility], [address] [place], [place] [address]    Special Requests   Final    NONE Performed at [name], [address] [place], [place] [address]    Culture MULTIPLE SPECIES PRESENT, SUGGEST RECOLLECTION (A)  Final   Report Status 06/03/2019 FINAL  Final  Culture, blood (x 2)     Status: None (Preliminary result)   Collection Time: 06/02/19  5:01 PM   Specimen: BLOOD  Result Value Ref Range Status   Specimen Description BLOOD RIGHT HAND  Final   Special Requests   Final    BOTTLES DRAWN AEROBIC ONLY Blood Culture adequate volume   Culture   Final    NO GROWTH 4 DAYS Performed  at [facility], [address] [place], [place] [address]    Report Status PENDING  Incomplete  Culture, blood (x 2)     Status: None (Preliminary result)   Collection Time: 06/02/19  5:01 PM   Specimen: BLOOD  Result Value Ref Range Status   Specimen Description BLOOD LEFT ASSIST CONTROL  Final   Special Requests   Final    BOTTLES DRAWN AEROBIC ONLY Blood Culture adequate volume   Culture   Final    NO GROWTH 4 DAYS Performed at [facility], [address] [place], [place] [address]    Report Status PENDING  Incomplete  MRSA PCR Screening     Status: None   Collection Time: 06/02/19  6:35 PM   Specimen: Nasopharyngeal  Result Value Ref Range Status   MRSA by PCR NEGATIVE NEGATIVE Final    Comment:        The GeneXpert MRSA Assay (FDA approved for NASAL specimens only), is one component of a comprehensive MRSA colonization surveillance program. It is not intended to diagnose MRSA infection nor to guide or monitor treatment for MRSA infections. Performed at [facility], [address] [place], [place] [address]   Urine Culture     Status: Abnormal   Collection Time: 06/02/19  9:30 PM   Specimen: PATH Other; Urine  Result Value Ref Range Status   Specimen Description   Final    URINE, CLEAN CATCH Performed at [facility], [address] [place], [place] [address]    Special Requests   Final    NONE Performed at [facility], [place], [place] [address]    Culture 30,000 COLONIES/mL PROTEUS MIRABILIS (A)  Final   Report Status 06/05/2019 FINAL  Final   Organism ID, Bacteria PROTEUS MIRABILIS (A)  Final      Susceptibility   Proteus mirabilis - MIC*    AMPICILLIN <=2 SENSITIVE Sensitive     CEFAZOLIN <=4 SENSITIVE Sensitive     CEFTRIAXONE <=1 SENSITIVE Sensitive     CIPROFLOXACIN <=0.25 SENSITIVE Sensitive     GENTAMICIN <=1 SENSITIVE Sensitive     IMIPENEM 2 SENSITIVE Sensitive     NITROFURANTOIN 128 RESISTANT Resistant     TRIMETH/SULFA <=20 SENSITIVE Sensitive     AMPICILLIN/SULBACTAM <=2 SENSITIVE Sensitive     PIP/TAZO <=4 SENSITIVE Sensitive     * 30,000 COLONIES/mL PROTEUS MIRABILIS  C Difficile Quick Screen w PCR reflex     Status: None   Collection Time: 06/02/19 10:43 PM   Specimen: STOOL  Result Value Ref Range Status   C Diff antigen NEGATIVE NEGATIVE Final   C Diff toxin NEGATIVE NEGATIVE Final   C Diff interpretation No C. difficile detected.  Final    Comment: Performed at [facility], [address] [place], [place] [address]    Coagulation Studies: Recent Labs    06/05/19 1957  LABPROT 16.5*  INR 1.4*    Urinalysis: No results for input(s): COLORURINE, LABSPEC, PHURINE, GLUCOSEU, HGBUR, BILIRUBINUR, KETONESUR, PROTEINUR, UROBILINOGEN, NITRITE, LEUKOCYTESUR in the last 72 hours.  Invalid input(s): APPERANCEUR    Imaging: No results found.   Medications:   . sodium chloride 10 mL/hr at 06/04/19 0320  . cefTRIAXone (ROCEPHIN)  IV Stopped (06/05/19 1743)  . dextrose 5% lactated ringers with KCl 20 mEq/L 100 mL/hr at 06/06/19 1143   . chlorhexidine  15 mL Mouth Rinse BID  . Chlorhexidine Gluconate Cloth  6 each Topical Daily  .  insulin aspart  0-9 Units Subcutaneous Q4H  . mouth rinse  15 mL Mouth Rinse q12n4p  . pantoprazole (PROTONIX) IV  40 mg Intravenous BID   albuterol, fentaNYL (SUBLIMAZE) injection, midazolam, ondansetron  (ZOFRAN) IV  Assessment/ Plan:  Ms. Jamie Boyd is a 68 y.o. black female with Alzheimer's dementia, Parkinson's, who is followed by Hospice , who was admitted to [facility] on 06/02/2019 for Atrial fibrillation with RVR ([place]) [I48.91] Patient found to have left nephrolithiasis and obstruction requiring ureteral stent placement by Dr. Gloriann Loan on 4/26.   1. Acute renal failure: baseline creatinine of 1, GFR > 60 on 09/05/2018.  2. Hypernatremia:   3. Hypotension 4. Obstructive uropathy.  3. Anemia with renal failure: status post PRBC transfusion 4. Thrombocytopenia 5. Atrial fibrillation:    Acute renal failure secondary to prerenal azotemia and obstruction  No acute indication for dialysis. Not a candidate for long term dialysis.  Hold hydrochlorothiazide and ibuprofen.  Will sign off. Please call with questions.    LOS: [place] 4/30/20215:13 PM

## 2019-06-06 NOTE — Evaluation (Addendum)
Clinical/Bedside Swallow Evaluation Patient Details  Name: Jamie Boyd MRN: [id number] Date of Birth: 1950-04-23  Today's Date: 06/06/2019 Time: SLP Start Time (ACUTE ONLY): 1000 SLP Stop Time (ACUTE ONLY): 1100 SLP Time Calculation (min) (ACUTE ONLY): 60 min  Past Medical History:  Past Medical History:  Diagnosis Date  . Alzheimer disease ([place])   . Anemia   . Dementia ([place])   . Goiter   . Memory deficit   . Parkinson's disease ([place])   . Urinary tract infection, site not specified    Past Surgical History:  Past Surgical History:  Procedure Laterality Date  . COLONOSCOPY WITH PROPOFOL N/A 02/18/2015   Procedure: COLONOSCOPY WITH PROPOFOL;  Surgeon: Hulen Luster, MD;  Location: [facility] ENDOSCOPY;  Service: Gastroenterology;  Laterality: N/A;  . CYSTOSCOPY WITH STENT PLACEMENT Left 06/02/2019   Procedure: CYSTOSCOPY WITH STENT PLACEMENT;  Surgeon: Lucas Mallow, MD;  Location: [facility] ORS;  Service: Urology;  Laterality: Left;  . ESOPHAGOGASTRODUODENOSCOPY N/A 02/18/2015   Procedure: ESOPHAGOGASTRODUODENOSCOPY (EGD);  Surgeon: Hulen Luster, MD;  Location: [facility] ENDOSCOPY;  Service: Gastroenterology;  Laterality: N/A;   HPI:  Pt is a 69 y.o. female with medical history significant of hospice care, hyperlipidemia, stroke, GERD, indwelling Foley catheter, recurrent UTI, Parkinson's disease, dementia, iron deficiency anemia. is a current hospice patient with a terminal diagnosis of Parkinson's disease. Home hospice case manager visited with patient and family the day of admission for concerns of decreased responsiveness and patient not eating or drinking. Family requested patient to be transferred to the emergency department for evaluation.    Assessment / Plan / Recommendation Clinical Impression  Pt seen today for BSE d/t concern for oral phase deficits of swallowing; decreased oral awareness for task of oral intake. Pt has a Baseline of Cognitive decline, Dementia; Parkinson's Dis. She has been  taking more softened foods and some liquids thickened at home per the family report -- they have even been giving her liquids via Syringe Feeding in the past few days. Pt was awake, eyes opened and looking at SLP during this evaluation, but nonverbal and did not make any attempt to verbalize or follow instructions -- suspect this is related to her baseline Cognitive decline/Dementia. Pt positioned upright w/ head forward for po trials. Pt given oral care prior to po's; she demonstrated lingual backing and bunching to the stim of the oral swabs. Noted full lingual and labial movements in response to stim.  Pt was visually presented w/ trials of ice cream, applesauce and Nectar liquids via TSP only - verbal and visual cues given to prepare pt for po tasks. Then, pt was given light stim of Spoon at lips - tactile stim to engage in po tasks. She opened mouth fairly consistently to orally accept po trials w/ these cues and encouragement. Pt pulled boluses from the Spoon then exhibited adequate bolus management of trials for A-P transfer then swallow. Oral clearing was appropriate b/t trials. Noted min Increased oral phase time intermittently w/ trials given -- she appeared to gum/mash then chew on lips min longer b/f finishing oral clearing of the trial. Noted she may have been bothered by extra skin on inside of lips and Distracted orally by that.  No overt s/s of aspiration noted w/ the trials given -- no decline in O2 sats(98%); HR/RR remained stable at their baseline. Respiratory effort and rate appeared calm. Unable to assess vocal quality d/t nonverbal status. Pt required full feeding assistance. No oral motor weakness was noted during  bolus management; oral care. Feeding of po trials to pt was completed using a TSP ONLY -- no need for other options as pt opened mouth readily for po trials when presented.  Recommend a Dysphagia level 1 (puree) w/ NECTAR consistency liquids VIA TSP; this appears to be close to  the diet the family was feeding pt per their description. Recommend aspiration precautions and feeding support using Cues. Recommend monitoring pt's status and engagement for po's -- Stop feeding if she is not engaged and try later on. Discussed and gave education on dysphagia/swallowing and impact from Dementia, parkinson's dis.; discussed food prep/options and use of Nectar liquids for its viscosity for a safer liquid for pt to swallow to reduce risk for aspiration. Handouts given to family on diet and the above precautions and supportive strategies. Recommend any pills Crushed in puree.  Family gave verbal agreement; also practiced feeding pt toward end of session. ST services will be available for further education and needs while admitted. Dietician and NSG updated.   SLP Visit Diagnosis: Dysphagia, oropharyngeal phase (R13.12)(Cognitive deficits; Dementia, parkinson's dis.)    Aspiration Risk  Mild aspiration risk;Moderate aspiration risk;Risk for inadequate nutrition/hydration(reduced w/ aspiration precautions)    Diet Recommendation  Dysphagia level 1 (puree) w/ NECTAR consistency liquids VIA TSP; aspiration precautions; feeding support providing cues and monitoring her engagement w/ po tasks.  Medication Administration: Crushed with puree(for safer swallowing)    Other  Recommendations Recommended Consults: (dietician f/u; Palliative care f/u) Oral Care Recommendations: Oral care BID;Oral care before and after PO;Staff/trained caregiver to provide oral care Other Recommendations: Order thickener from pharmacy;Prohibited food (jello, ice cream, thin soups);Remove water pitcher;Have oral suction available   Follow up Recommendations None      Frequency and Duration min 1 x/week  1 week       Prognosis Prognosis for Safe Diet Advancement: Fair Barriers to Reach Goals: Cognitive deficits;Time post onset;Severity of deficits      Swallow Study   General Date of Onset: 06/02/19 HPI:  Pt is a 69 y.o. female with medical history significant of hospice care, hyperlipidemia, stroke, GERD, indwelling Foley catheter, recurrent UTI, Parkinson's disease, dementia, iron deficiency anemia. is a current hospice patient with a terminal diagnosis of Parkinson's disease. Home hospice case manager visited with patient and family the day of admission for concerns of decreased responsiveness and patient not eating or drinking. Family requested patient to be transferred to the emergency department for evaluation.  Type of Study: Bedside Swallow Evaluation Previous Swallow Assessment: 2018 Diet Prior to this Study: NPO Temperature Spikes Noted: No(wbc 15.8) Respiratory Status: Room air History of Recent Intubation: Yes Length of Intubations (days): 3 days Date extubated: 06/05/19 Behavior/Cognition: Alert;Cooperative;Pleasant mood;Confused;Distractible;Requires cueing(Nonverbal) Oral Cavity Assessment: Within Functional Limits(dry lips) Oral Care Completed by SLP: Yes Oral Cavity - Dentition: Edentulous(few front lower teeth) Vision: (n/a) Self-Feeding Abilities: Total assist Patient Positioning: Upright in bed(needed positioning) Baseline Vocal Quality: (nonverbal) Volitional Cough: Cognitively unable to elicit Volitional Swallow: Unable to elicit    Oral/Motor/Sensory Function Overall Oral Motor/Sensory Function: Within functional limits(in response to the oral swabs; lingual bunching/strength)   Ice Chips Ice chips: Not tested   Thin Liquid Thin Liquid: Not tested    Nectar Thick Nectar Thick Liquid: Impaired Presentation: Spoon(10+ trials) Oral Phase Impairments: Poor awareness of bolus Oral phase functional implications: Prolonged oral transit Pharyngeal Phase Impairments: (none)   Honey Thick Honey Thick Liquid: Not tested   Puree Puree: Impaired Presentation: Spoon(fed; 10+ trials) Oral Phase Impairments: Poor  awareness of bolus Oral Phase Functional Implications: Prolonged  oral transit Pharyngeal Phase Impairments: (none)   Solid     Solid: Not tested       Orinda Kenner, MS, CCC-SLP Vessie Olmsted 06/06/2019,1:57 PM

## 2019-06-06 NOTE — Progress Notes (Signed)
Pt in no acute distress. VSS. Comfort feeds provided. EMS called to transport pt to home with hospice. Chronic foley in place. Awaiting EMS.

## 2019-06-06 NOTE — Progress Notes (Signed)
Jamie Boyd visited pt. as follow-up from yesterday's visit; pt. awake and sitting up in bed, eyes open; niece Jamie Boyd remains @ bedside and was here all last night.  Says she has not slept well but is glad to be @ pt.'s side.  Pt.'s brother Jamie Boyd was @ bedside earlier this AM when [facility] walked by room.  Family are hoping pt. can be discharged to home tomorrow.  No needs expressed at this time.  Jamie Boyd will continue to monitor.    06/06/19 1210  Clinical Encounter Type  Visited With Patient and family together  Visit Type Follow-up;Psychological support;Spiritual support;Social support;Critical Care  Referral From Family;Chaplain  Consult/Referral To Chaplain  Spiritual Encounters  Spiritual Needs Emotional  Stress Factors  Family Stress Factors Major life changes;Health changes

## 2019-06-06 NOTE — Discharge Summary (Signed)
Physician Discharge Summary  Jamie Boyd [id number] DOB: [birth year] DOA: 06/02/2019  PCP: Sharyne Peach, MD  Admit date: 06/02/2019 Discharge date: 06/06/2019  Recommendations for Outpatient Follow-up:  1. Discharge to home with hospice.  Discharge Diagnoses: Principal diagnosis is #1 1. Septic shock due to UTI 2. Severe protein calorie malnutrition 3.  Ureteral stone 4. Paroxysmal atrial fibrillation 5. Acute blood loss anemia 6. Bright red blood per rectum  Discharge Condition: Serious  Disposition: Home with hospice  Diet recommendation: As tolerated  Filed Weights   06/02/19 1042 06/02/19 1836  Weight: 86 kg 77.2 kg    History of present illness:  Jamie Boyd is a 69 y.o. female with medical history significant of hospice care, hyperlipidemia, stroke, GERD, indwelling Foley catheter, recurrent UTI, Parkinson's disease, dementia, iron deficiency anemia, who presents with altered mental status.  Per her niece, patient has been having decreased response in the past 3 days.  At baseline, patient is bed ridden and largely ely nonverbal. Patient was found to have oxygen desaturation to 88% on home level 2 L nasal cannula oxygen, which improved her to 90% on 4 L oxygen, later on improved to 97% on 2 L oxygen. No active cough or respiratory distress noted.  Not sure if patient has any chest pain.  No active nausea, vomiting, diarrhea noted. Patient was found to have A. fib with RVR with heart rate up to 200 -->170s.  Cardizem drip started in ED.   ED Course: pt was found to have WBC 20.0, lactic acid 1.4, troponin 14, urinalysis (hazy appearance, small amount of leukocyte, few bacteria, WBC 0-5), AKI with creatinine 3.55, BUN 139, pending COVID-19 PCR, temperature normal, soft blood pressure currently, RR 21, chest x-ray showed bilateral basilar opacity.  Patient is admitted to progressive bed as inpatient  Hospital Course: 69 yo female with alzheimer's dementia admitted  with sepsis, acute renal failure, and acute encephalopathy secondary to UTI in the setting of obstructing left ureteral calculus s/p cystoscopy and left ureteral stent placement remained mechanically intubated postop. Noted to have acute GI bleed. The patient was evaluated by SLP today. She was cleared for DYS level 1 (puree) diet with nectar thick liquids. The family has decided to take the patient home with hospice on comfort care.   Today's assessment: S: The patient is resting comfortably. Daughter is at bedside. O: Vitals:  Vitals:   06/06/19 1600 06/06/19 1630  BP: 96/64 98/63  Pulse:    Resp: 13 16  Temp:    SpO2:     Exam:  Constitutional:   The patient is awake. She is non-verbal. She is in no acute distress. Respiratory:   No increased work of breathing.  No wheezes, rales, or rhonchi  No tactile fremitus Cardiovascular:   Regular rate and rhythm  No murmurs, ectopy, or gallups.  No lateral PMI. No thrills. Abdomen:   Abdomen is soft, non-tender, non-distended  No hernias, masses, or organomegaly  Normoactive bowel sounds.  Musculoskeletal:   No cyanosis, clubbing, or edema Skin:   No rashes, lesions, ulcers  palpation of skin: no induration or nodules Discharge Instructions  Discharge Instructions    Activity as tolerated - No restrictions   Complete by: As directed    Call MD for:  difficulty breathing, headache or visual disturbances   Complete by: As directed    Call MD for:  persistant nausea and vomiting   Complete by: As directed    Call MD for:  severe uncontrolled pain  Complete by: As directed    Diet - low sodium heart healthy   Complete by: As directed    Discharge instructions   Complete by: As directed    Discharge to home on hospice.   Increase activity slowly   Complete by: As directed      Allergies as of 06/06/2019      Reactions   Pollen Extract Other (See Comments)      Medication List    STOP taking these  medications   albuterol (2.5 MG/3ML) 0.083% nebulizer solution Commonly known as: PROVENTIL   aspirin EC 81 MG tablet   bisacodyl 10 MG suppository Commonly known as: DULCOLAX   divalproex 125 MG capsule Commonly known as: DEPAKOTE SPRINKLE   furosemide 20 MG tablet Commonly known as: LASIX   hydrochlorothiazide 12.5 MG capsule Commonly known as: MICROZIDE   ibuprofen 400 MG tablet Commonly known as: ADVIL   memantine 10 MG tablet Commonly known as: NAMENDA   montelukast 10 MG tablet Commonly known as: SINGULAIR   omeprazole 20 MG capsule Commonly known as: PRILOSEC   oxybutynin 5 MG tablet Commonly known as: DITROPAN   QUEtiapine 100 MG tablet Commonly known as: SEROQUEL   QUEtiapine 25 MG tablet Commonly known as: SEROQUEL   senna-docusate 8.6-50 MG tablet Commonly known as: Senokot-S   sennosides-docusate sodium 8.6-50 MG tablet Commonly known as: SENOKOT-S   traZODone 100 MG tablet Commonly known as: DESYREL   witch hazel-glycerin pad Commonly known as: TUCKS     TAKE these medications   Acetaminophen 500 MG capsule Take 1,000 mg by mouth every 12 (twelve) hours as needed for pain.   fexofenadine 180 MG tablet Commonly known as: ALLEGRA Take 180 mg by mouth daily.   LORazepam 0.5 MG tablet Commonly known as: ATIVAN Take 0.5 mg by mouth every 4 (four) hours as needed (anxiety, agitation, restlessness or nausea).   morphine CONCENTRATE 10 mg / 0.5 ml concentrated solution Take 5 mg by mouth every hour as needed for severe pain or shortness of breath.   nystatin powder Generic drug: nystatin Apply 1 application topically 2 (two) times daily as needed (skin irritation).   oxyCODONE 5 MG immediate release tablet Commonly known as: Oxy IR/ROXICODONE Take 2.5-5 mg by mouth every 4 (four) hours as needed for severe pain or breakthrough pain.   phenylephrine-shark liver oil-mineral oil-petrolatum 0.25-3-14-71.9 % rectal ointment Commonly known  as: PREPARATION H Place 1 application rectally 4 (four) times daily as needed for hemorrhoids.      Allergies  Allergen Reactions   Pollen Extract Other (See Comments)    The results of significant diagnostics from this hospitalization (including imaging, microbiology, ancillary and laboratory) are listed below for reference.    Significant Diagnostic Studies: CT HEAD WO CONTRAST  Result Date: 06/02/2019 CLINICAL DATA:  Dementia.  Encephalopathy.  Mental status changes. EXAM: CT HEAD WITHOUT CONTRAST TECHNIQUE: Contiguous axial images were obtained from the base of the skull through the vertex without intravenous contrast. COMPARISON:  09/10/2017 FINDINGS: Brain: Advanced generalized brain atrophy, progressive since the previous study. Extensive chronic small-vessel ischemic changes throughout the cerebral hemispheric white matter. No sign of acute infarction, mass lesion or hemorrhage. Ventricular size is considerably increased since the previous study, but seemingly in proportion to the progressive generalized atrophy. Possibility of normal pressure hydrocephalus is not excluded however. Vascular: There is atherosclerotic calcification of the major vessels at the base of the brain. Skull: Negative Sinuses/Orbits: Clear/normal Other: None IMPRESSION: Since 2019, there has  been progressive generalized brain atrophy and worsening of chronic small vessel ischemic change throughout the brain. Ventricles are larger, which I presume relates to ex vacuo enlargement from the worsened atrophy. I cannot completely rule out the possibility of normal pressure hydrocephalus. There is no acute stroke or intracranial hemorrhage evident by CT. Electronically Signed   By: Nelson Chimes M.D.   On: 06/02/2019 16:48   US RENAL  Result Date: 06/02/2019 CLINICAL DATA:  Acute kidney injury EXAM: RENAL / URINARY TRACT ULTRASOUND COMPLETE COMPARISON:  Ultrasound 10/26/2017 FINDINGS: Right Kidney: Renal measurements: 10.8  x 3.9 x 4.3 cm = volume: 97 mL . Echogenicity within normal limits. No mass or hydronephrosis visualized. Left Kidney: Renal measurements: 10.8 x 6.1 x 4.5 cm = volume: 156 mL. Cortical echogenicity within normal limits. Moderate hydronephrosis. Echogenic foci within dilated left renal pelvis, potentially stones, these measure up to 12 mm. Visualized proximal ureter is also dilated. Bladder: Foley catheter in the bladder which is empty Other: None. IMPRESSION: 1. Moderate left hydronephrosis with proximal hydroureter. Echogenic foci within dilated left renal pelvis, probable stones. CT KUB suggested for further evaluation and to assess for distal obstruction of the left kidney 2. Normal ultrasound appearance of the right kidney Electronically Signed   By: Donavan Foil M.D.   On: 06/02/2019 17:39   DG Chest Port 1 View  Result Date: 06/02/2019 CLINICAL DATA:  Endotracheally intubated. EXAM: PORTABLE CHEST 1 VIEW COMPARISON:  Earlier this day. FINDINGS: Placement of endotracheal tube with tip at the level of the clavicular heads 5.2 cm from the carina. Persistent low lung volumes with streaky bibasilar opacities. Trace pleural effusions. Normal heart size and mediastinal contours for degree of rotation. No pulmonary edema or pneumothorax. No acute osseous abnormalities are seen. IMPRESSION: 1. Placement of endotracheal tube with tip at the level of the clavicular heads 5.2 cm from the carina. 2. Persistent low lung volumes with streaky bibasilar opacities, favor atelectasis. Electronically Signed   By: Keith Rake M.D.   On: 06/02/2019 22:55   DG Chest Portable 1 View  Result Date: 06/02/2019 CLINICAL DATA:  Hypoxia EXAM: PORTABLE CHEST 1 VIEW COMPARISON:  03/07/2019 FINDINGS: Low lung volumes. Bibasilar opacities. No significant pleural effusion. No pneumothorax. Stable cardiomediastinal contours. IMPRESSION: Bibasilar opacities favored to reflect atelectasis. Electronically Signed   By: Macy Mis  M.D.   On: 06/02/2019 10:57   ECHOCARDIOGRAM COMPLETE  Result Date: 06/04/2019    ECHOCARDIOGRAM REPORT   Patient Name:   Jamie Boyd Date of Exam: 06/03/2019 Medical Rec #:  [id number]    Height:       66.0 in Accession #:    [id number]   Weight:       170.2 lb Date of Birth:  [birth year]     BSA:          1.867 m Patient Age:    69 years     BP:           92/52 mmHg Patient Gender: F            HR:           98 bpm. Exam Location:  [facility] Procedure: 2D Echo, Cardiac Doppler and Color Doppler Indications:     I48.91 Atrial fibrillation.  History:         Patient has prior history of Echocardiogram examinations, most                  recent 01/25/2017. Parkinson's Disease.  Sonographer:  Wilford Sports Rodgers-Jones Referring Phys:  [id number] Robin Searing VISSER Diagnosing Phys: Nelva Bush MD IMPRESSIONS  1. Left ventricular ejection fraction, by estimation, is 55 to 60%. The left ventricle has normal function. The left ventricle has no regional wall motion abnormalities. Left ventricular diastolic parameters are consistent with age-related delayed relaxation (normal).  2. Right ventricular systolic function is normal. The right ventricular size is normal. There is normal pulmonary artery systolic pressure.  3. The mitral valve is degenerative. Trivial mitral valve regurgitation. No evidence of mitral stenosis.  4. The aortic valve was not well visualized. Aortic valve regurgitation is not visualized. Mild aortic valve stenosis. Aortic valve mean gradient measures 10.4 mmHg.  5. The inferior vena cava is normal in size with greater than 50% respiratory variability, suggesting right atrial pressure of 3 mmHg.  6. Cannot rule out small atrial level right to left shunt. FINDINGS  Left Ventricle: Left ventricular ejection fraction, by estimation, is 55 to 60%. The left ventricle has normal function. The left ventricle has no regional wall motion abnormalities. The left ventricular internal cavity size was normal in size.  There is  no left ventricular hypertrophy. Left ventricular diastolic parameters are consistent with age-related delayed relaxation (normal). Right Ventricle: The right ventricular size is normal. Right vetricular wall thickness was not assessed. Right ventricular systolic function is normal. There is normal pulmonary artery systolic pressure. The tricuspid regurgitant velocity is 2.04 m/s, and with an assumed right atrial pressure of 3 mmHg, the estimated right ventricular systolic pressure is [id number] mmHg. Left Atrium: Left atrial size was normal in size. Right Atrium: Right atrial size was normal in size. Pericardium: There is no evidence of pericardial effusion. Mitral Valve: The mitral valve is degenerative in appearance. There is mild thickening of the mitral valve leaflet(s). There is mild calcification of the mitral valve leaflet(s). Trivial mitral valve regurgitation. No evidence of mitral valve stenosis. Tricuspid Valve: The tricuspid valve is normal in structure. Tricuspid valve regurgitation is trivial. Aortic Valve: The aortic valve was not well visualized. . There is mild thickening of the aortic valve. Aortic valve regurgitation is not visualized. Mild aortic stenosis is present. Mild aortic valve annular calcification. There is mild thickening of the aortic valve. Aortic valve mean gradient measures 10.4 mmHg. Aortic valve peak gradient measures 17.1 mmHg. Aortic valve area, by VTI measures 1.59 cm. Pulmonic Valve: The pulmonic valve was not well visualized. Pulmonic valve regurgitation is mild. No evidence of pulmonic stenosis. Aorta: The aortic root is normal in size and structure. Pulmonary Artery: The pulmonary artery is not well seen. Venous: The inferior vena cava is normal in size with greater than 50% respiratory variability, suggesting right atrial pressure of 3 mmHg. IAS/Shunts: There is redundancy of the interatrial septum. Cannot rule out small atrial level right to left shunt.  LEFT  VENTRICLE PLAX 2D LVIDd:         3.83 cm  Diastology LVIDs:         2.69 cm  LV e' lateral:   9.70 cm/s LV PW:         0.87 cm  LV E/e' lateral: 7.0 LV IVS:        0.72 cm  LV e' medial:    8.70 cm/s LVOT diam:     1.90 cm  LV E/e' medial:  7.9 LV SV:         53 LV SV Index:   29 LVOT Area:     2.84 cm  RIGHT VENTRICLE RV  Basal diam:  3.94 cm RV S prime:     23.60 cm/s LEFT ATRIUM             Index       RIGHT ATRIUM           Index LA diam:        3.50 cm 1.87 cm/m  RA Area:     10.50 cm LA Vol (A2C):   47.1 ml 25.22 ml/m RA Volume:   23.40 ml  12.53 ml/m LA Vol (A4C):   31.1 ml 16.66 ml/m LA Biplane Vol: 39.7 ml 21.26 ml/m  AORTIC VALVE AV Area (Vmax):    1.53 cm AV Area (Vmean):   1.50 cm AV Area (VTI):     1.59 cm AV Vmax:           207.00 cm/s AV Vmean:          151.000 cm/s AV VTI:            0.336 m AV Peak Grad:      17.1 mmHg AV Mean Grad:      10.4 mmHg LVOT Vmax:         112.00 cm/s LVOT Vmean:        79.750 cm/s LVOT VTI:          0.188 m LVOT/AV VTI ratio: 0.56  AORTA Ao Root diam: 2.70 cm MITRAL VALVE               TRICUSPID VALVE MV Area (PHT): 2.56 cm    TR Peak grad:   16.6 mmHg MV Decel Time: 296 msec    TR Vmax:        204.00 cm/s MV E velocity: 68.30 cm/s MV A velocity: 99.40 cm/s  SHUNTS MV E/A ratio:  0.69        Systemic VTI:  0.19 m                            Systemic Diam: 1.90 cm Nelva Bush MD Electronically signed by Nelva Bush MD Signature Date/Time: 06/04/2019/6:35:48 AM    Final    CT RENAL STONE STUDY  Result Date: 06/02/2019 CLINICAL DATA:  Altered mental status. Oxygen desaturation. Recurrent UTI. Indwelling Foley catheter. EXAM: CT ABDOMEN AND PELVIS WITHOUT CONTRAST TECHNIQUE: Multidetector CT imaging of the abdomen and pelvis was performed following the standard protocol without IV contrast. COMPARISON:  CT abdomen pelvis 04/04/2017 FINDINGS: Lower chest: Small bilateral pleural effusions. There are scattered bandlike opacities in the bilateral lungs  favored represent atelectasis. There is a more focal small opacity in the peripheral left lung (series 6, image 6) which could reflect atelectasis though infection not excluded. Hepatobiliary: No focal liver abnormality is seen. Unremarkable gallbladder. Pancreas: Unremarkable. No surrounding inflammatory changes. Spleen: Normal in size without focal abnormality. Adrenals/Urinary Tract: Adrenal glands are unremarkable. The left kidney is enlarged. There is moderate/severe left hydronephrosis and ureterectasis secondary to a stone in the distal left ureter measuring 7 mm. The right kidney is unremarkable. The urinary bladder is decompressed with a Foley catheter in place. Stomach/Bowel: Stomach is within normal limits. Appendix appears normal. There is moderate wall thickening in the rectum which is nonspecific but could reflect chronic inflammation. No evidence of obstruction. There are scattered colonic diverticula without evidence of diverticulitis. Vascular/Lymphatic: Moderate atherosclerotic disease of the abdominal aorta without aneurysm. Vascular patency cannot be assessed in the absence IV contrast. No lymphadenopathy. Reproductive: No adnexal masses.  Other: No abdominal wall hernia or abnormality. No abdominopelvic ascites. Musculoskeletal: There is a mild new wedge compression fracture at L1. No significant listhesis. Multilevel degenerative disc disease throughout the lumbar spine. There are multiple lucencies throughout the right iliac bone. IMPRESSION: 1. There is moderate/severe left hydronephrosis and ureterectasis secondary to a 7 mm stone in the distal left ureter. 2. Small bilateral pleural effusions. Scattered bandlike opacities in the bilateral lungs favored represent atelectasis. There is a more focal small opacity in the peripheral left lung which could reflect atelectasis though infection not excluded. 3. Moderate wall thickening in the rectum which is nonspecific but could reflect chronic  inflammation/proctitis. Colonoscopy should be considered when/if clinically appropriate. 4. Multiple small lucencies throughout the right iliac bone, favored to represent intra osseous pneumatocysts, a clinically insignificant finding. 5. Mild new wedge compression fracture at L1. No significant listhesis. Aortic Atherosclerosis (ICD10-I70.0). Electronically Signed   By: Audie Pinto M.D.   On: 06/02/2019 20:48   [place] EKG SITE RITE  Result Date: 06/04/2019 If Site Rite image not attached, placement could not be confirmed due to current cardiac rhythm.  [place] EKG SITE RITE  Result Date: 06/04/2019 If Site Rite image not attached, placement could not be confirmed due to current cardiac rhythm.   Microbiology: Recent Results (from the past 240 hour(s))  Respiratory Panel by RT PCR (Flu A&B, Covid) - Nasopharyngeal Swab     Status: None   Collection Time: 06/02/19 11:05 AM   Specimen: Nasopharyngeal Swab  Result Value Ref Range Status   SARS Coronavirus 2 by RT PCR NEGATIVE NEGATIVE Final    Comment: (NOTE) SARS-CoV-2 target nucleic acids are NOT DETECTED. The SARS-CoV-2 RNA is generally detectable in upper respiratoy specimens during the acute phase of infection. The lowest concentration of SARS-CoV-2 viral copies this assay can detect is 131 copies/mL. A negative result does not preclude SARS-Cov-2 infection and should not be used as the sole basis for treatment or other patient management decisions. A negative result may occur with  improper specimen collection/handling, submission of specimen other than nasopharyngeal swab, presence of viral mutation(s) within the areas targeted by this assay, and inadequate number of viral copies (<131 copies/mL). A negative result must be combined with clinical observations, patient history, and epidemiological information. The expected result is Negative. Fact Sheet for Patients:  PinkCheek.be Fact Sheet for  Healthcare Providers:  GravelBags.it This test is not yet ap proved or cleared by the [place] FDA and  has been authorized for detection and/or diagnosis of SARS-CoV-2 by FDA under an Emergency Use Authorization (EUA). This EUA will remain  in effect (meaning this test can be used) for the duration of the COVID-19 declaration under Section 564(b)(1) of the Act, 21 U.S.C. section 360bbb-3(b)(1), unless the authorization is terminated or revoked sooner.    Influenza A by PCR NEGATIVE NEGATIVE Final   Influenza B by PCR NEGATIVE NEGATIVE Final    Comment: (NOTE) The Xpert Xpress SARS-CoV-2/FLU/RSV assay is intended as an aid in  the diagnosis of influenza from Nasopharyngeal swab specimens and  should not be used as a sole basis for treatment. Nasal washings and  aspirates are unacceptable for Xpert Xpress SARS-CoV-2/FLU/RSV  testing. Fact Sheet for Patients: PinkCheek.be Fact Sheet for Healthcare Providers: GravelBags.it This test is not yet approved or cleared by the [place] FDA and  has been authorized for detection and/or diagnosis of SARS-CoV-2 by  FDA under an Emergency Use Authorization (EUA). This EUA will remain  in effect (  meaning this test can be used) for the duration of the  Covid-19 declaration under Section 564(b)(1) of the Act, 21  U.S.C. section 360bbb-3(b)(1), unless the authorization is  terminated or revoked. Performed at [facility], [address] [place], [place] [address]   Urine Culture     Status: Abnormal   Collection Time: 06/02/19  3:31 PM   Specimen: Urine, Random  Result Value Ref Range Status   Specimen Description   Final    URINE, RANDOM Performed at [facility], [address] [place], [place] [address]    Special Requests   Final    NONE Performed at [facility], [address] [place], [place] [address]     Culture MULTIPLE SPECIES PRESENT, SUGGEST RECOLLECTION (A)  Final   Report Status 06/03/2019 FINAL  Final  Culture, blood (x 2)     Status: None (Preliminary result)   Collection Time: 06/02/19  5:01 PM   Specimen: BLOOD  Result Value Ref Range Status   Specimen Description BLOOD RIGHT HAND  Final   Special Requests   Final    BOTTLES DRAWN AEROBIC ONLY Blood Culture adequate volume   Culture   Final    NO GROWTH 4 DAYS Performed at [facility], [address], [place], [place] [address]    Report Status PENDING  Incomplete  Culture, blood (x 2)     Status: None (Preliminary result)   Collection Time: 06/02/19  5:01 PM   Specimen: BLOOD  Result Value Ref Range Status   Specimen Description BLOOD LEFT ASSIST CONTROL  Final   Special Requests   Final    BOTTLES DRAWN AEROBIC ONLY Blood Culture adequate volume   Culture   Final    NO GROWTH 4 DAYS Performed at [facility], [address], [place], [place] [address]    Report Status PENDING  Incomplete  MRSA PCR Screening     Status: None   Collection Time: 06/02/19  6:35 PM   Specimen: Nasopharyngeal  Result Value Ref Range Status   MRSA by PCR NEGATIVE NEGATIVE Final    Comment:        The GeneXpert MRSA Assay (FDA approved for NASAL specimens only), is one component of a comprehensive MRSA colonization surveillance program. It is not intended to diagnose MRSA infection nor to guide or monitor treatment for MRSA infections. Performed at [facility], [address], [place], [place] [address]   Urine Culture     Status: Abnormal   Collection Time: 06/02/19  9:30 PM   Specimen: PATH Other; Urine  Result Value Ref Range Status   Specimen Description   Final    URINE, CLEAN CATCH Performed at [facility], [address], [place], [place] [address]    Special Requests   Final    NONE Performed at [facility], [place],  [address]    Culture  30,000 COLONIES/mL PROTEUS MIRABILIS (A)  Final   Report Status 06/05/2019 FINAL  Final   Organism ID, Bacteria PROTEUS MIRABILIS (A)  Final      Susceptibility   Proteus mirabilis - MIC*    AMPICILLIN <=2 SENSITIVE Sensitive     CEFAZOLIN <=4 SENSITIVE Sensitive     CEFTRIAXONE <=1 SENSITIVE Sensitive     CIPROFLOXACIN <=0.25 SENSITIVE Sensitive     GENTAMICIN <=1 SENSITIVE Sensitive     IMIPENEM 2 SENSITIVE Sensitive     NITROFURANTOIN 128 RESISTANT Resistant  TRIMETH/SULFA <=20 SENSITIVE Sensitive     AMPICILLIN/SULBACTAM <=2 SENSITIVE Sensitive     PIP/TAZO <=4 SENSITIVE Sensitive     * 30,000 COLONIES/mL PROTEUS MIRABILIS  C Difficile Quick Screen w PCR reflex     Status: None   Collection Time: 06/02/19 10:43 PM   Specimen: STOOL  Result Value Ref Range Status   C Diff antigen NEGATIVE NEGATIVE Final   C Diff toxin NEGATIVE NEGATIVE Final   C Diff interpretation No C. difficile detected.  Final    Comment: Performed at [facility], [address] [place], [place] [id number]     Labs: Basic Metabolic Panel: Recent Labs  Lab 06/02/19 1040 06/03/19 0039 06/04/19 0652 06/04/19 0702 06/06/19 0229  NA 143 157* 145  --  139  K 4.8 3.9 3.3*  --  3.2*  CL 111 119* 118*  --  112*  CO2 19* 29 20*  --  23  GLUCOSE 137* 144* 183*  --  116*  BUN 139* 105* 68*  --  25*  CREATININE 3.55* 2.49* 1.65*  --  1.18*  CALCIUM 8.6* 7.0* 7.8*  --  7.9*  MG  --  2.9*  --  2.8* 1.9  PHOS  --  5.3* 3.7 3.8 3.2   Liver Function Tests: Recent Labs  Lab 06/02/19 1040 06/04/19 0652  AST 104*  --   ALT 94*  --   ALKPHOS 245*  --   BILITOT 8.3*  --   PROT 7.5  --   ALBUMIN 2.0* 1.6*   No results for input(s): LIPASE, AMYLASE in the last 168 hours. No results for input(s): AMMONIA in the last 168 hours. CBC: Recent Labs  Lab 06/02/19 1040 06/02/19 1040 06/03/19 0039 06/03/19 1422 06/04/19 0652 06/04/19 0702 06/05/19 1957 06/06/19 0229 06/06/19 1201  WBC 20.0*   --  16.9*  --   --  19.7* 18.0* 15.8*  --   NEUTROABS 15.2*  --  14.3*  --   --  15.7* 14.5* 12.4*  --   HGB 9.9*   < > 6.8*   < > 8.9* 8.9* 7.9* 7.4* 7.7*  HCT 26.9*   < > 18.6*   < > 24.6* 24.5* 22.0* 20.4* 21.4*  MCV 77.5*  --  78.5*  --   --  80.6 80.6 79.4*  --   PLT 69*  --  52*  --   --  77* 130* 131*  --    < > = values in this interval not displayed.   Cardiac Enzymes: No results for input(s): CKTOTAL, CKMB, CKMBINDEX, TROPONINI in the last 168 hours. BNP: BNP (last 3 results) No results for input(s): BNP in the last 8760 hours.  ProBNP (last 3 results) No results for input(s): PROBNP in the last 8760 hours.  CBG: Recent Labs  Lab 06/05/19 2341 06/06/19 0401 06/06/19 0712 06/06/19 0812 06/06/19 1126  GLUCAP 66* 83 61* 71 73    Principal Problem:   Acute metabolic encephalopathy Active Problems:   GERD without esophagitis   Iron deficiency anemia due to chronic blood loss   Adult failure to thrive   CVA (cerebral vascular accident) ([place])   Palliative care by specialist   Acute renal failure ([place])   Alzheimer disease ([place])   HLD (hyperlipidemia)   Atrial fibrillation with RVR (H[facility])   Abnormal LFTs   Thrombocytopenia (H[facility])   Septic shock (H[facility])   Atrial flutter ([place])   Acute respiratory failure with hypoxia ([place])   DNR (do  not resuscitate)   Time coordinating discharge: 38 minutes  Signed:        Justun Anaya, DO [facility]  06/06/2019, 6:08 PM

## 2019-06-06 NOTE — Significant Event (Signed)
During change of shift on 04/28 notified pt had a large amount of bright red bleeding per rectum.  Post GI bleed hgb 7.9/PT 16.5/INR 1.4/APTT 40.  Palliative Care consulted prior to one-way extubation and are providing support to family and having continued goals of treatment discussions with Jamie Boyd's family.  Will need to determine if pts family would want pt to undergo additional testing and/or aggressive care prior to consulting GI.  Will continue to monitor and assess pt   Marda Stalker, [place] Pager [phone] (please enter 7 digits) [place] Pager [phone] (please enter 7 digits)

## 2019-06-06 NOTE — Progress Notes (Signed)
CRITICAL CARE PROGRESS NOTE    Name: Jamie Boyd MRN: [id number] DOB: 05-03-1950     LOS: 4   SUBJECTIVE FINDINGS & SIGNIFICANT EVENTS   Patient description:  69 yo female with alzheimer's dementia admitted with sepsis, acute renal failure, and acute encephalopathy  secondary to UTI in the setting of obstructing left ureteral calculus s/p cystoscopy and left ureteral stent placement remained mechanically intubated postop.  Noted to have acute GI bleed   SIGNIFICANT EVENTS/STUDIES:  04/26: Pt admitted to the stepdown unit with sepsis and acute metabolic encephalopathy  [id number]: CT Renal Stone Study revealed there is moderate/severe left hydronephrosis and ureterectasis secondary to a 7 mm stone in the distal left ureter. Small bilateral pleural effusions. Scattered bandlike opacities in the bilateral lungs favored represent atelectasis. There is a more focal small opacity in the peripheral left lung which could reflect atelectasis though infection not excluded. Moderate wall thickening in the rectum which is nonspecific but could reflect chronic inflammation/proctitis. Colonoscopy should be considered when/if clinically appropriate. Multiple small lucencies throughout the right iliac bone, favored to represent intra osseous pneumatocysts, a clinically insignificant finding. Mild new wedge compression fracture at L1. No significant Listhesis. 04/26: Due to CT findings urology consulted and pt underwent emergent cystoscopy with left retrograde pyelogram and left ureteral stent placement.  She remained intubated postop PCCM team consulted for vent management 04/26: Pt noted to have watery maroon colored stool postop (according to pts niece this is new for the pt)  Lines / Drains: PIVx2  Cultures / Sepsis markers: Resitant  proteus in urine culture  Antibiotics: Rocephin   Protocols / Consultants: Urology ,nephrology, PCCM, hospitalist, GI ,palliative ,cardiology     PAST MEDICAL HISTORY   Past Medical History:  Diagnosis Date  . Alzheimer disease ([place])   . Anemia   . Dementia ([name])   . Goiter   . Memory deficit   . Parkinson's disease ([place])   . Urinary tract infection, site not specified      SURGICAL HISTORY   Past Surgical History:  Procedure Laterality Date  . COLONOSCOPY WITH PROPOFOL N/A 02/18/2015   Procedure: COLONOSCOPY WITH PROPOFOL;  Surgeon: Hulen Luster, MD;  Location: [facility] ENDOSCOPY;  Service: Gastroenterology;  Laterality: N/A;  . CYSTOSCOPY WITH STENT PLACEMENT Left 06/02/2019   Procedure: CYSTOSCOPY WITH STENT PLACEMENT;  Surgeon: Lucas Mallow, MD;  Location: [facility] ORS;  Service: Urology;  Laterality: Left;  . ESOPHAGOGASTRODUODENOSCOPY N/A 02/18/2015   Procedure: ESOPHAGOGASTRODUODENOSCOPY (EGD);  Surgeon: Hulen Luster, MD;  Location: [facility] ENDOSCOPY;  Service: Gastroenterology;  Laterality: N/A;     FAMILY HISTORY   Family History  Problem Relation Age of Onset  . Diabetes Maternal Uncle   . Prostate cancer Neg Hx   . Bladder Cancer Neg Hx   . Kidney cancer Neg Hx      SOCIAL HISTORY   Social History   Tobacco Use  . Smoking status: Former Smoker    Types: Cigarettes    Quit date: 01/25/2014    Years since quitting: 5.3  . Smokeless tobacco: Never Used  Substance Use Topics  . Alcohol use: No  . Drug use: No     MEDICATIONS   Current Medication:  Current Facility-Administered Medications:  .  0.9 %  sodium chloride infusion, 250 mL, Intravenous, Continuous, Blakeney, Dreama Saa, NP, Last Rate: 10 mL/hr at 06/04/19 0320, Restarted at 06/04/19 0320 .  albuterol (PROVENTIL) (2.5 MG/3ML) 0.083% nebulizer solution 2.5 mg, 2.5 mg, Nebulization, Q4H  PRN, Ivor Costa, MD .  cefTRIAXone (ROCEPHIN) 2 g in sodium chloride 0.9 % 100 mL IVPB, 2 g, Intravenous, q1800,  Flora Lipps, MD, Stopped at 06/05/19 1743 .  chlorhexidine (PERIDEX) 0.12 % solution 15 mL, 15 mL, Mouth Rinse, BID, Kasa, Kurian, MD, 15 mL at 06/06/19 0747 .  Chlorhexidine Gluconate Cloth 2 % PADS 6 each, 6 each, Topical, Daily, Awilda Bill, NP, 6 each at 06/05/19 2141 .  dextrose 10 % infusion, , Intravenous, Continuous, Swayze, Ava, DO, Last Rate: 30 mL/hr at 06/06/19 0800, Rate Verify at 06/06/19 0800 .  fentaNYL (SUBLIMAZE) injection 25-50 mcg, 25-50 mcg, Intravenous, Q1H PRN, Flora Lipps, MD, 25 mcg at 06/05/19 2140 .  insulin aspart (novoLOG) injection 0-9 Units, 0-9 Units, Subcutaneous, Q4H, Blakeney, Dana G, NP .  lactated ringers infusion, , Intravenous, Continuous, Swayze, Ava, DO, Last Rate: 75 mL/hr at 06/06/19 0800, Rate Verify at 06/06/19 0800 .  MEDLINE mouth rinse, 15 mL, Mouth Rinse, q12n4p, Kasa, Kurian, MD, 15 mL at 06/05/19 1718 .  midazolam (VERSED) injection 1-2 mg, 1-2 mg, Intravenous, Q2H PRN, Flora Lipps, MD, 1 mg at 06/04/19 0022 .  ondansetron (ZOFRAN) injection 4 mg, 4 mg, Intravenous, Q8H PRN, Ivor Costa, MD .  pantoprazole (PROTONIX) injection 40 mg, 40 mg, Intravenous, BID, Flora Lipps, MD, 40 mg at 06/06/19 0747 .  potassium chloride 10 mEq in 100 mL IVPB, 10 mEq, Intravenous, Q1 Hr x 4, Swayze, Ava, DO    ALLERGIES   Pollen extract    REVIEW OF SYSTEMS     Unable to obtain ROS due to septic shock with encephalopathy  PHYSICAL EXAMINATION   Vital Signs: Temp:  [97.9 F (36.6 C)-99.9 F (37.7 C)] 98.8 F (37.1 C) (04/30 0800) Pulse Rate:  [74-86] 86 (04/30 0800) Resp:  [11-20] 16 (04/30 0800) BP: (95-139)/(53-105) 96/53 (04/30 0800) SpO2:  [95 %-99 %] 99 % (04/30 0800) FiO2 (%):  [30 %] 30 % (04/29 1124)  GENERAL:Chronically ill appearing HEAD: Normocephalic, atraumatic.  EYES: Pupils equal, round, reactive to light.  No scleral icterus.  MOUTH: Moist mucosal membrane. NECK: Supple. No thyromegaly. No nodules. No JVD.  PULMONARY:  mild bibasilar crackles. CARDIOVASCULAR: S1 and S2. Regular rate and rhythm. No murmurs, rubs, or gallops.  GASTROINTESTINAL: Soft, nontender, non-distended. No masses. Positive bowel sounds. No hepatosplenomegaly.  MUSCULOSKELETAL: No swelling, clubbing, or edema.  NEUROLOGIC: Mild distress due to acute illness SKIN:intact,warm,dry   PERTINENT DATA     Infusions: . sodium chloride 10 mL/hr at 06/04/19 0320  . cefTRIAXone (ROCEPHIN)  IV Stopped (06/05/19 1743)  . dextrose 30 mL/hr at 06/06/19 0800  . lactated ringers 75 mL/hr at 06/06/19 0800  . potassium chloride     Scheduled Medications: . chlorhexidine  15 mL Mouth Rinse BID  . Chlorhexidine Gluconate Cloth  6 each Topical Daily  . insulin aspart  0-9 Units Subcutaneous Q4H  . mouth rinse  15 mL Mouth Rinse q12n4p  . pantoprazole (PROTONIX) IV  40 mg Intravenous BID   PRN Medications: albuterol, fentaNYL (SUBLIMAZE) injection, midazolam, ondansetron (ZOFRAN) IV Hemodynamic parameters:   Intake/Output: 04/29 0701 - 04/30 0700 In: 1931.2 [I.V.:1919.4; IV Piggyback:11.8] Out: [id number] [Urine:1020]  Ventilator  Settings: Vent Mode: PRVC FiO2 (%):  [30 %] 30 % Set Rate:  [18 bmp] 18 bmp Vt Set:  [450 mL] 450 mL PEEP:  [5 cmH20] 5 cmH20      LAB RESULTS:  Basic Metabolic Panel: Recent Labs  Lab 06/02/19 1040 06/02/19 1040 06/03/19 0039  06/03/19 0039 06/04/19 [id number] 06/04/19 0702 06/06/19 0229  NA 143  --  157*  --  145  --  139  K 4.8   < > 3.9   < > 3.3*  --  3.2*  CL 111  --  119*  --  118*  --  112*  CO2 19*  --  29  --  20*  --  23  GLUCOSE 137*  --  144*  --  183*  --  116*  BUN 139*  --  105*  --  68*  --  25*  CREATININE 3.55*  --  2.49*  --  1.65*  --  1.18*  CALCIUM 8.6*  --  7.0*  --  7.8*  --  7.9*  MG  --   --  2.9*  --   --  2.8* 1.9  PHOS  --   --  5.3*  --  3.7 3.8 3.2   < > = values in this interval not displayed.   Liver Function Tests: Recent Labs  Lab 06/02/19 1040 06/04/19 0652  AST  104*  --   ALT 94*  --   ALKPHOS 245*  --   BILITOT 8.3*  --   PROT 7.5  --   ALBUMIN 2.0* 1.6*   No results for input(s): LIPASE, AMYLASE in the last 168 hours. No results for input(s): AMMONIA in the last 168 hours. CBC: Recent Labs  Lab 06/02/19 1040 06/02/19 1040 06/03/19 0039 06/03/19 1422 06/04/19 0014 06/04/19 0652 06/04/19 0702 06/05/19 1957 06/06/19 0229  WBC 20.0*  --  16.9*  --   --   --  19.7* 18.0* 15.8*  NEUTROABS 15.2*  --  14.3*  --   --   --  15.7* 14.5* 12.4*  HGB 9.9*   < > 6.8*   < > 8.9* 8.9* 8.9* 7.9* 7.4*  HCT 26.9*   < > 18.6*   < > 24.1* 24.6* 24.5* 22.0* 20.4*  MCV 77.5*  --  78.5*  --   --   --  80.6 80.6 79.4*  PLT 69*  --  52*  --   --   --  77* 130* 131*   < > = values in this interval not displayed.   Cardiac Enzymes: No results for input(s): CKTOTAL, CKMB, CKMBINDEX, TROPONINI in the last 168 hours. BNP: Invalid input(s): POCBNP CBG: Recent Labs  Lab 06/05/19 2106 06/05/19 2341 06/06/19 0401 06/06/19 0712 06/06/19 0812  GLUCAP 70 66* 83 61* 71        ASSESSMENT AND PLAN    -Multidisciplinary rounds held today    Septic shock due to UTI -present on admission -palliative care cosultation - patient is now DNR with comfort care and home hospice.  Etiology is -Resistant proteus on urine culture   Severe protein calorie malnutrition - albumin <1.7 -bitemporal wasting with peripheral muscular atrophy -RD nutritional consultation  Ureteral stone  s/p cystoscopy with left ureteral stent placement  Paroxysmal Atrial fibrillation  -s/p TTE with normal findings -cardio on case appreciate input - consider amio if PAF recurs caution with hypotension -follow up cardiac biomarkers as indicated ICU telemetry monitoring   Acute blood loss anemia  -due to Lower GI bleed with likely proctatitis as per CT abd - GI on case - appreciate input - at this time not candidate for procedure due to medically unstalbe status and in septic  shock -monitor h/h - no grossly appreicable rectal bleeding  - monitor NG/OG aspirate  for coffee ground material   NEUROLOGY - intubated and sedated- background of CVA and parkinsons disease - minimal sedation to achieve a RASS goal: -1 Wake up assessment pending   ID -continue IV abx as prescibed -follow up cultures  GI/Nutrition GI PROPHYLAXIS as indicated DIET-->TF's as tolerated Constipation protocol as indicated  ENDO - ICU hypoglycemic\Hyperglycemia protocol -check FSBS per protocol   ELECTROLYTES -follow labs as needed -replace as needed -pharmacy consultation   DVT/GI PRX ordered -SCDs  TRANSFUSIONS AS NEEDED MONITOR FSBS ASSESS the need for LABS as needed   Critical care provider statement:    Critical care time (minutes):  109   Critical care time was exclusive of:  Separately billable procedures and treating other patients   Critical care was necessary to treat or prevent imminent or life-threatening deterioration of the following conditions:  septic shock, UTI, ureteral stone, acute blood loss anemia, parkinsons, hx of CVA, multiple comorbid conditions   Critical care was time spent personally by me on the following activities:  Development of treatment plan with patient or surrogate, discussions with consultants, evaluation of patient's response to treatment, examination of patient, obtaining history from patient or surrogate, ordering and performing treatments and interventions, ordering and review of laboratory studies and re-evaluation of patient's condition.  I assumed direction of critical care for this patient from another provider in my specialty: no    This document was prepared using Dragon voice recognition software and may include unintentional dictation errors.    Ottie Glazier, M.D.  Division of [place]

## 2019-06-07 LAB — CULTURE, BLOOD (ROUTINE X 2)
Culture: NO GROWTH
Culture: NO GROWTH
Special Requests: ADEQUATE
Special Requests: ADEQUATE

## 2019-07-08 DEATH — deceased

## 2019-08-15 IMAGING — CT CT ABD-PELV W/ CM
2 of 5 series · 15 of 46 positions shown, 17 images · IV contrast (APPLIED)
Comparison: Abdominal CT dated 11/08/2015

CLINICAL DATA: 66-year-old female with generalized abdominal pain.

EXAM:
CT ABDOMEN AND PELVIS WITH CONTRAST
TECHNIQUE: Multidetector CT imaging of the abdomen and pelvis was performed
using the standard protocol following bolus administration of
intravenous contrast.
CONTRAST:  100mL [id number] IOPAMIDOL ([id number]) INJECTION 61%

[Series 2: routine abd/pel with · axial · 0.72mm/px · z∈[-780,-375]mm · 12 of 91 slices shown, 14 images]
[im 5/91  soft-tissue]
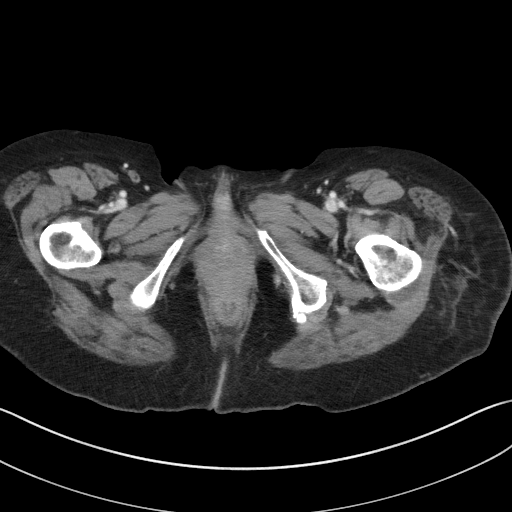
[im 5/91  bone]
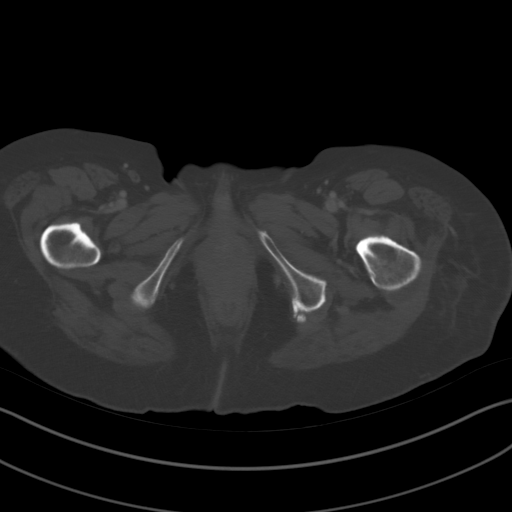
[im 15/91  soft-tissue]
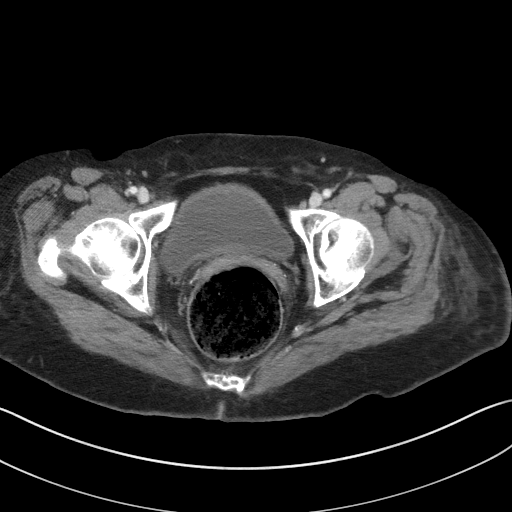
[im 19/91  soft-tissue]
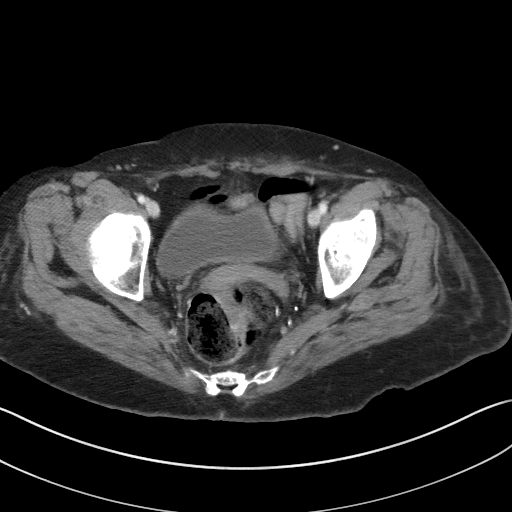
[im 29/91  soft-tissue]
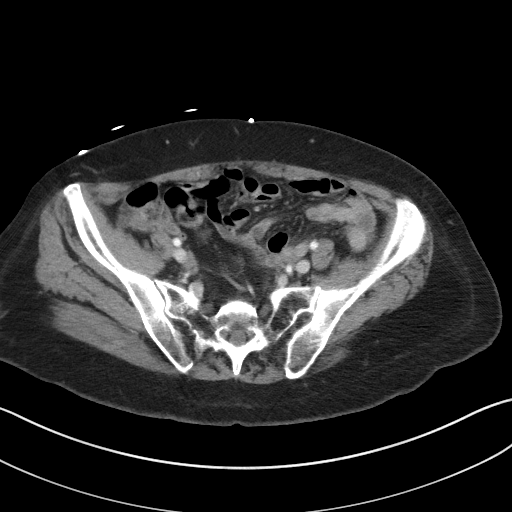
[im 34/91  soft-tissue]
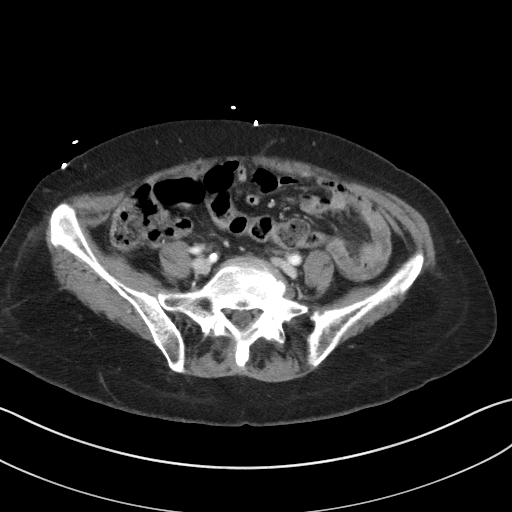
[im 43/91  soft-tissue]
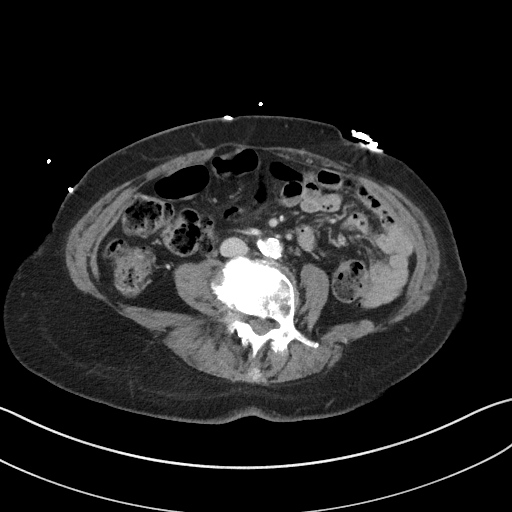
[im 48/91  soft-tissue]
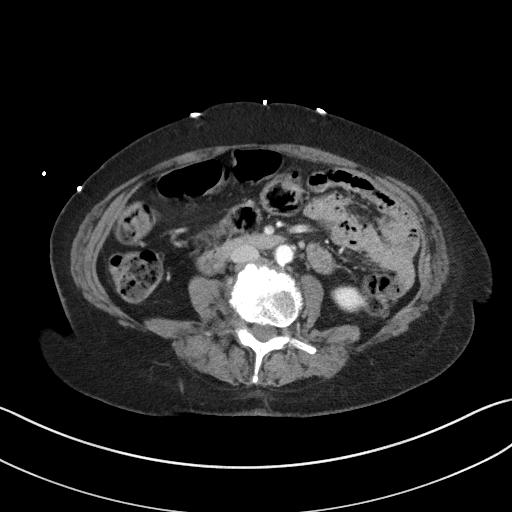
[im 57/91  soft-tissue]
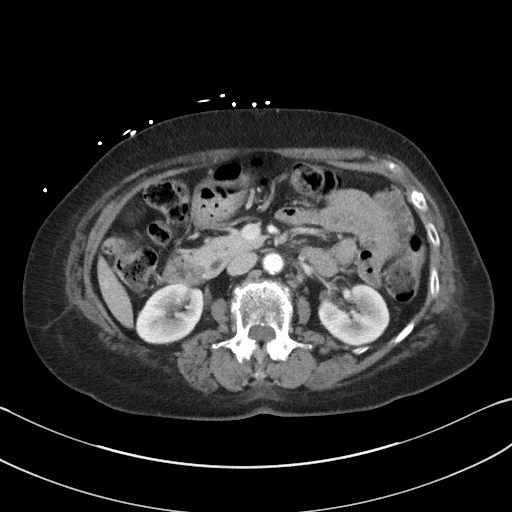
[im 62/91  soft-tissue]
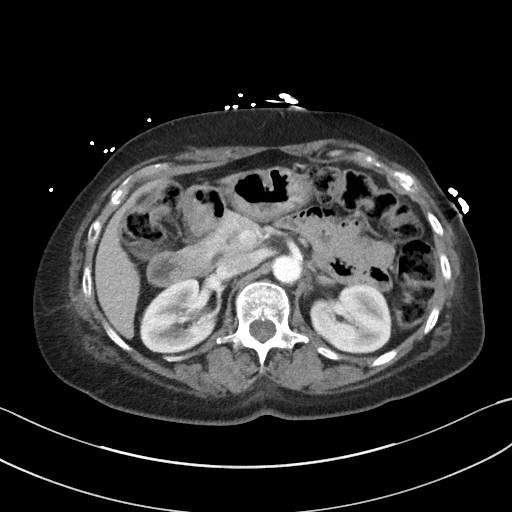
[im 62/91  bone]
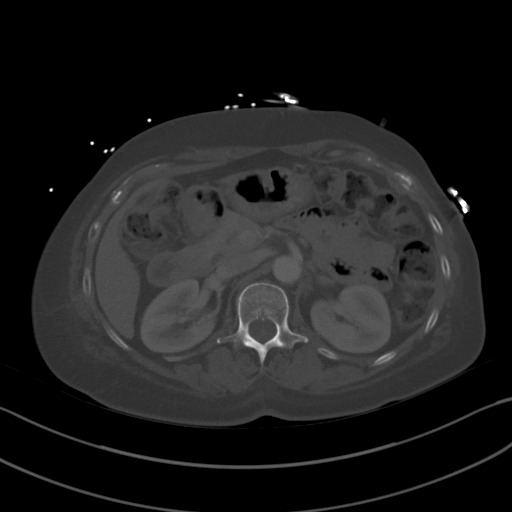
[im 72/91  soft-tissue]
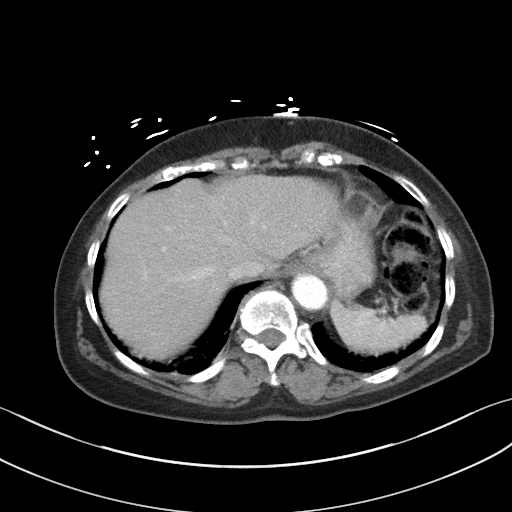
[im 76/91  soft-tissue]
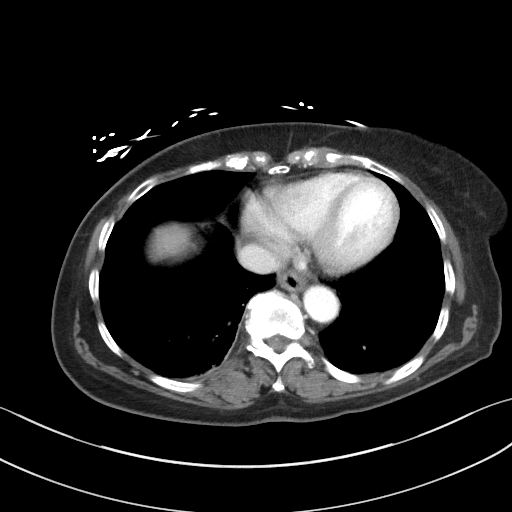
[im 86/91  soft-tissue]
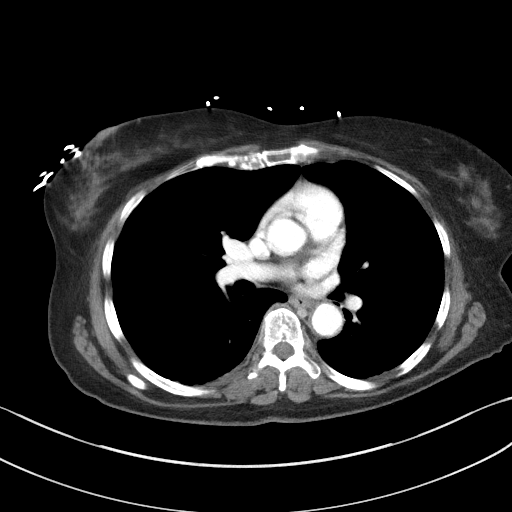

[Series 5: coronal st · coronal · 0.70mm/px · 3 of 72 slices shown]
[im 24/72  soft-tissue]
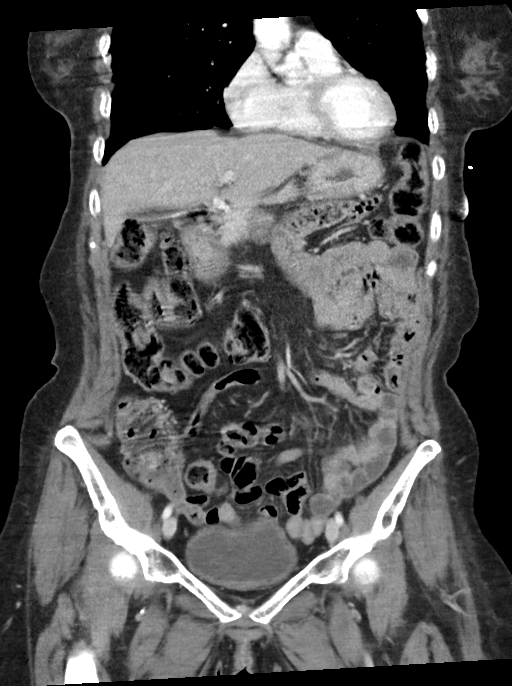
[im 32/72  soft-tissue]
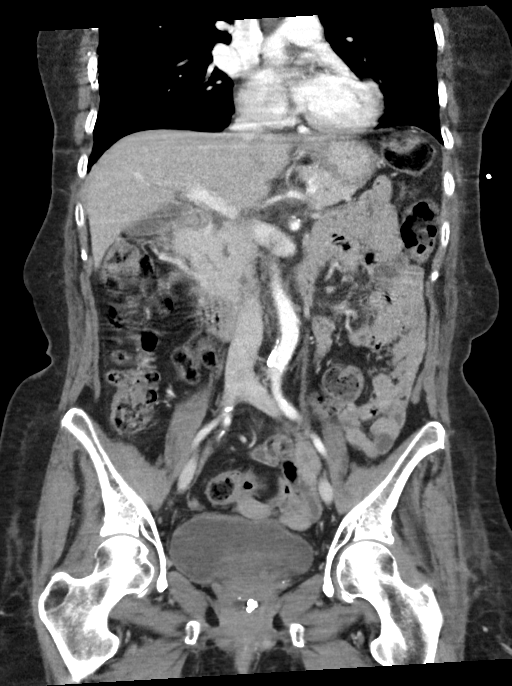
[im 40/72  soft-tissue]
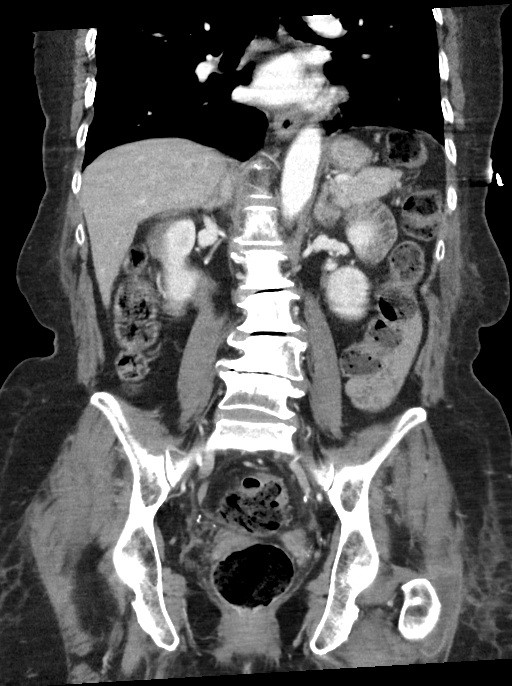

[15 of 46 positions shown; findings below may reference images not displayed]

FINDINGS: Evaluation of this exam is limited due to respiratory motion
artifact.

Lower chest: Bibasilar linear atelectasis/scarring.

No intra-abdominal free air or free fluid.

Hepatobiliary: The liver is unremarkable. No intrahepatic biliary
ductal dilatation. The gallbladder is unremarkable as well.

Pancreas: Unremarkable. No pancreatic ductal dilatation or
surrounding inflammatory changes.

Spleen: Normal in size without focal abnormality.

Adrenals/Urinary Tract: The adrenal glands are unremarkable. There
is a 12 mm nonobstructing left renal inferior pole calculus as seen
on the prior CT. No hydronephrosis. The right kidney is
unremarkable. There is uniform enhancement and symmetric excretion
of contrast by both kidneys. The visualized ureters and urinary
bladder appear unremarkable.

Stomach/Bowel: There is moderate stool throughout the colon. There
is no bowel obstruction or active inflammation. Normal appendix.

Vascular/Lymphatic: There is moderate aortoiliac atherosclerotic
disease. No portal venous gas. There is no adenopathy.

Reproductive: Hysterectomy.  No pelvic mass.

Other: None

Musculoskeletal: Degenerative changes of the spine. Levoscoliosis at
L3-L4. No acute osseous pathology. Small amount of fluid noted in
the left trochanteric bursa. There is mild stranding of the
subcutaneous soft tissues of the left hip over the left greater
trochanter.
IMPRESSION: 1. No acute intra-abdominal or pelvic pathology.
2. No definite inflammatory changes of the pancreas. Evaluation
however is somewhat limited respiratory motion artifact. Correlation
with pancreatic enzymes recommended to exclude acute pancreatitis.
3. No bowel obstruction or active inflammation.  Normal appendix.
4. Stable nonobstructing left renal inferior pole 12 mm stone. No
hydronephrosis.
5. Fluid within the left to can take bursa. Correlation with
clinical exam is recommended to evaluate for bursitis.
6.  Aortic Atherosclerosis ([id number].L).
